# Patient Record
Sex: Female | Born: 1965 | Race: White | Hispanic: No | Marital: Married | Smoking: Never smoker
Health system: Southern US, Community
[De-identification: ages and names within clinical notes are randomized; demographics above are authoritative.]

## PROBLEM LIST (undated history)

## (undated) DIAGNOSIS — F32A Depression, unspecified: Secondary | ICD-10-CM

## (undated) DIAGNOSIS — F329 Major depressive disorder, single episode, unspecified: Secondary | ICD-10-CM

## (undated) DIAGNOSIS — E785 Hyperlipidemia, unspecified: Secondary | ICD-10-CM

## (undated) DIAGNOSIS — E109 Type 1 diabetes mellitus without complications: Secondary | ICD-10-CM

## (undated) DIAGNOSIS — F419 Anxiety disorder, unspecified: Secondary | ICD-10-CM

## (undated) DIAGNOSIS — R112 Nausea with vomiting, unspecified: Secondary | ICD-10-CM

## (undated) DIAGNOSIS — E039 Hypothyroidism, unspecified: Secondary | ICD-10-CM

## (undated) DIAGNOSIS — I872 Venous insufficiency (chronic) (peripheral): Secondary | ICD-10-CM

## (undated) DIAGNOSIS — R600 Localized edema: Secondary | ICD-10-CM

## (undated) DIAGNOSIS — Z9889 Other specified postprocedural states: Secondary | ICD-10-CM

## (undated) DIAGNOSIS — R590 Localized enlarged lymph nodes: Secondary | ICD-10-CM

## (undated) DIAGNOSIS — C50919 Malignant neoplasm of unspecified site of unspecified female breast: Secondary | ICD-10-CM

## (undated) HISTORY — DX: Depression, unspecified: F32.A

## (undated) HISTORY — DX: Major depressive disorder, single episode, unspecified: F32.9

## (undated) HISTORY — DX: Hyperlipidemia, unspecified: E78.5

## (undated) HISTORY — DX: Anxiety disorder, unspecified: F41.9

---

## 1989-08-13 HISTORY — PX: TUBAL LIGATION: SHX77

## 2002-05-14 ENCOUNTER — Other Ambulatory Visit: Admission: RE | Admit: 2002-05-14 | Discharge: 2002-05-14 | Payer: Self-pay | Admitting: Obstetrics and Gynecology

## 2004-04-03 ENCOUNTER — Other Ambulatory Visit: Admission: RE | Admit: 2004-04-03 | Discharge: 2004-04-03 | Payer: Self-pay | Admitting: Obstetrics and Gynecology

## 2004-07-13 ENCOUNTER — Ambulatory Visit: Payer: Self-pay | Admitting: Endocrinology

## 2004-11-01 ENCOUNTER — Ambulatory Visit: Payer: Self-pay | Admitting: Endocrinology

## 2004-12-18 ENCOUNTER — Ambulatory Visit: Payer: Self-pay | Admitting: Endocrinology

## 2005-03-28 ENCOUNTER — Ambulatory Visit: Payer: Self-pay | Admitting: Endocrinology

## 2005-06-21 ENCOUNTER — Other Ambulatory Visit: Admission: RE | Admit: 2005-06-21 | Discharge: 2005-06-21 | Payer: Self-pay | Admitting: Obstetrics and Gynecology

## 2005-11-13 ENCOUNTER — Ambulatory Visit: Payer: Self-pay | Admitting: Endocrinology

## 2006-03-19 ENCOUNTER — Ambulatory Visit: Payer: Self-pay | Admitting: Endocrinology

## 2006-09-04 ENCOUNTER — Ambulatory Visit: Payer: Self-pay | Admitting: Endocrinology

## 2007-03-20 ENCOUNTER — Encounter: Payer: Self-pay | Admitting: Endocrinology

## 2007-03-20 DIAGNOSIS — E109 Type 1 diabetes mellitus without complications: Secondary | ICD-10-CM | POA: Insufficient documentation

## 2007-03-20 DIAGNOSIS — F3289 Other specified depressive episodes: Secondary | ICD-10-CM | POA: Insufficient documentation

## 2007-03-20 DIAGNOSIS — F329 Major depressive disorder, single episode, unspecified: Secondary | ICD-10-CM | POA: Insufficient documentation

## 2007-03-20 DIAGNOSIS — F411 Generalized anxiety disorder: Secondary | ICD-10-CM | POA: Insufficient documentation

## 2007-08-08 ENCOUNTER — Ambulatory Visit: Payer: Self-pay | Admitting: Endocrinology

## 2007-08-14 HISTORY — PX: LUMBAR DISC SURGERY: SHX700

## 2007-10-23 ENCOUNTER — Encounter: Payer: Self-pay | Admitting: Endocrinology

## 2008-02-04 ENCOUNTER — Ambulatory Visit: Payer: Self-pay | Admitting: Endocrinology

## 2008-02-04 DIAGNOSIS — M79609 Pain in unspecified limb: Secondary | ICD-10-CM | POA: Insufficient documentation

## 2008-02-04 LAB — CONVERTED CEMR LAB: Sed Rate: 33 mm/hr — ABNORMAL HIGH (ref 0–22)

## 2008-02-05 ENCOUNTER — Telehealth (INDEPENDENT_AMBULATORY_CARE_PROVIDER_SITE_OTHER): Payer: Self-pay | Admitting: *Deleted

## 2008-02-05 ENCOUNTER — Encounter: Payer: Self-pay | Admitting: Endocrinology

## 2008-02-23 ENCOUNTER — Ambulatory Visit: Payer: Self-pay | Admitting: Endocrinology

## 2008-02-24 ENCOUNTER — Encounter (INDEPENDENT_AMBULATORY_CARE_PROVIDER_SITE_OTHER): Payer: Self-pay | Admitting: *Deleted

## 2008-03-23 ENCOUNTER — Encounter: Payer: Self-pay | Admitting: Endocrinology

## 2008-04-07 ENCOUNTER — Encounter: Payer: Self-pay | Admitting: Endocrinology

## 2008-04-10 ENCOUNTER — Encounter: Payer: Self-pay | Admitting: Endocrinology

## 2008-04-30 ENCOUNTER — Encounter: Admission: RE | Admit: 2008-04-30 | Discharge: 2008-04-30 | Payer: Self-pay | Admitting: Orthopedic Surgery

## 2008-05-20 ENCOUNTER — Encounter: Admission: RE | Admit: 2008-05-20 | Discharge: 2008-05-20 | Payer: Self-pay | Admitting: Orthopedic Surgery

## 2008-07-07 ENCOUNTER — Ambulatory Visit (HOSPITAL_COMMUNITY): Admission: RE | Admit: 2008-07-07 | Discharge: 2008-07-08 | Payer: Self-pay | Admitting: Specialist

## 2008-09-06 ENCOUNTER — Telehealth: Payer: Self-pay | Admitting: Endocrinology

## 2008-10-25 ENCOUNTER — Encounter (INDEPENDENT_AMBULATORY_CARE_PROVIDER_SITE_OTHER): Payer: Self-pay | Admitting: *Deleted

## 2008-10-25 ENCOUNTER — Ambulatory Visit: Payer: Self-pay | Admitting: Endocrinology

## 2008-10-25 DIAGNOSIS — E7800 Pure hypercholesterolemia, unspecified: Secondary | ICD-10-CM | POA: Insufficient documentation

## 2008-10-25 LAB — CONVERTED CEMR LAB
AST: 17 units/L (ref 0–37)
Albumin: 3.7 g/dL (ref 3.5–5.2)
BUN: 14 mg/dL (ref 6–23)
Basophils Absolute: 0.1 10*3/uL (ref 0.0–0.1)
Basophils Relative: 1 % (ref 0.0–3.0)
Bilirubin, Direct: 0.1 mg/dL (ref 0.0–0.3)
CO2: 30 meq/L (ref 19–32)
Chloride: 104 meq/L (ref 96–112)
Cholesterol: 183 mg/dL (ref 0–200)
Creatinine, Ser: 0.7 mg/dL (ref 0.4–1.2)
GFR calc Af Amer: 117 mL/min
GFR calc non Af Amer: 97 mL/min
HCT: 39.4 % (ref 36.0–46.0)
Monocytes Absolute: 0.5 10*3/uL (ref 0.1–1.0)
Neutro Abs: 4.3 10*3/uL (ref 1.4–7.7)
Neutrophils Relative %: 58.6 % (ref 43.0–77.0)
Platelets: 319 10*3/uL (ref 150–400)
RBC: 4.34 M/uL (ref 3.87–5.11)
Sodium: 140 meq/L (ref 135–145)
TSH: 2.76 microintl units/mL (ref 0.35–5.50)
Total Bilirubin: 1.1 mg/dL (ref 0.3–1.2)
Triglycerides: 41 mg/dL (ref 0–149)

## 2009-01-27 ENCOUNTER — Telehealth: Payer: Self-pay | Admitting: Endocrinology

## 2009-03-13 ENCOUNTER — Encounter: Payer: Self-pay | Admitting: Endocrinology

## 2009-04-13 ENCOUNTER — Telehealth: Payer: Self-pay | Admitting: Endocrinology

## 2009-04-14 ENCOUNTER — Telehealth: Payer: Self-pay | Admitting: Endocrinology

## 2009-09-14 ENCOUNTER — Encounter: Payer: Self-pay | Admitting: Endocrinology

## 2009-09-14 ENCOUNTER — Telehealth: Payer: Self-pay | Admitting: Endocrinology

## 2009-09-29 ENCOUNTER — Encounter (INDEPENDENT_AMBULATORY_CARE_PROVIDER_SITE_OTHER): Payer: Self-pay | Admitting: *Deleted

## 2009-09-29 ENCOUNTER — Ambulatory Visit: Payer: Self-pay | Admitting: Endocrinology

## 2009-09-29 LAB — CONVERTED CEMR LAB
Hgb A1c MFr Bld: 7.8 % — ABNORMAL HIGH (ref 4.6–6.5)
Microalb, Ur: 0.9 mg/dL (ref 0.0–1.9)

## 2009-09-30 ENCOUNTER — Telehealth: Payer: Self-pay | Admitting: Endocrinology

## 2010-02-15 ENCOUNTER — Telehealth: Payer: Self-pay | Admitting: Endocrinology

## 2010-02-21 ENCOUNTER — Ambulatory Visit: Payer: Self-pay | Admitting: Endocrinology

## 2010-03-13 LAB — HM PAP SMEAR: HM Pap smear: NORMAL

## 2010-03-21 ENCOUNTER — Ambulatory Visit: Payer: Self-pay | Admitting: Endocrinology

## 2010-03-21 LAB — CONVERTED CEMR LAB
AST: 13 units/L (ref 0–37)
Albumin: 3.6 g/dL (ref 3.5–5.2)
Alkaline Phosphatase: 53 units/L (ref 39–117)
Bilirubin Urine: NEGATIVE
Cholesterol: 195 mg/dL (ref 0–200)
Eosinophils Absolute: 0.3 10*3/uL (ref 0.0–0.7)
GFR calc non Af Amer: 94.63 mL/min (ref >60)
Hemoglobin, Urine: NEGATIVE
Hgb A1c MFr Bld: 8 % — ABNORMAL HIGH (ref 4.6–6.5)
LDL Cholesterol: 111 mg/dL — ABNORMAL HIGH (ref 0–99)
Lymphocytes Relative: 25.3 % (ref 12.0–46.0)
Lymphs Abs: 2 10*3/uL (ref 0.7–4.0)
MCV: 90.7 fL (ref 78.0–100.0)
Microalb, Ur: 0.2 mg/dL (ref 0.0–1.9)
Monocytes Relative: 7.3 % (ref 3.0–12.0)
Neutro Abs: 5 10*3/uL (ref 1.4–7.7)
Neutrophils Relative %: 62.5 % (ref 43.0–77.0)
Platelets: 320 10*3/uL (ref 150.0–400.0)
RBC: 4.29 M/uL (ref 3.87–5.11)
Sodium: 139 meq/L (ref 135–145)
Specific Gravity, Urine: 1.02 (ref 1.000–1.030)
TSH: 3.87 microintl units/mL (ref 0.35–5.50)
Total Bilirubin: 0.4 mg/dL (ref 0.3–1.2)
Total Protein, Urine: NEGATIVE mg/dL
VLDL: 11.8 mg/dL (ref 0.0–40.0)
WBC: 7.9 10*3/uL (ref 4.5–10.5)
pH: 7.5 (ref 5.0–8.0)

## 2010-03-23 ENCOUNTER — Ambulatory Visit: Payer: Self-pay | Admitting: Endocrinology

## 2010-03-23 DIAGNOSIS — L509 Urticaria, unspecified: Secondary | ICD-10-CM | POA: Insufficient documentation

## 2010-05-06 ENCOUNTER — Encounter: Payer: Self-pay | Admitting: Endocrinology

## 2010-09-10 LAB — CONVERTED CEMR LAB
ALT: 13 units/L (ref 0–35)
Basophils Absolute: 0.1 10*3/uL (ref 0.0–0.1)
Bilirubin, Direct: 0.2 mg/dL (ref 0.0–0.3)
CO2: 31 meq/L (ref 19–32)
Chloride: 100 meq/L (ref 96–112)
Creatinine, Ser: 0.7 mg/dL (ref 0.4–1.2)
Eosinophils Absolute: 0.3 10*3/uL (ref 0.0–0.6)
Eosinophils Relative: 4.6 % (ref 0.0–5.0)
GFR calc Af Amer: 118 mL/min
GFR calc non Af Amer: 98 mL/min
HDL: 76.6 mg/dL (ref >39.0)
Hemoglobin: 13.1 g/dL (ref 12.0–15.0)
LDL Cholesterol: 94 mg/dL (ref 0–99)
Lymphocytes Relative: 26.5 % (ref 12.0–46.0)
Microalb Creat Ratio: 3.5 mg/g (ref 0.0–30.0)
Monocytes Absolute: 0.6 10*3/uL (ref 0.2–0.7)
Monocytes Relative: 7.9 % (ref 3.0–11.0)
Platelets: 338 10*3/uL (ref 150–400)
Potassium: 3.8 meq/L (ref 3.5–5.1)
RBC: 4.18 M/uL (ref 3.87–5.11)
RDW: 12 % (ref 11.5–14.6)
Sodium: 138 meq/L (ref 135–145)
TSH: 3.77 microintl units/mL (ref 0.35–5.50)
Total Bilirubin: 0.7 mg/dL (ref 0.3–1.2)
Total CHOL/HDL Ratio: 2.4
Total Protein, Urine: NEGATIVE mg/dL
Triglycerides: 56 mg/dL (ref 0–149)
VLDL: 11 mg/dL (ref 0–40)
WBC: 7.4 10*3/uL (ref 4.5–10.5)

## 2010-09-12 NOTE — Letter (Signed)
Summary: [facility]  [address] [place], [place] [address]   Phone: [phone]  Fax: [phone]    Today's Date: September 29, 2009  Name of Patient: Jasmine Schwartz  The above named patient had a medical visit today at:  am / pm.  Please take this into consideration when reviewing the time away from work/school.    Special Instructions:  [ X ] None  [  ] To be off the remainder of today, returning to the normal work / school schedule tomorrow.  [  ] To be off until the next scheduled appointment on ______________________.  [  ] Other ________________________________________________________________ ________________________________________________________________________   Sincerely yours,   Margaret Pyle, CMA / [name] MD  Appended Document: Work Excuse faxed to [phone] per pt request

## 2010-09-12 NOTE — Progress Notes (Signed)
Summary: OV due  Phone Note Outgoing Call Call back at [facility] Phone [phone]   Call placed by: Brenton Grills MA,  February 15, 2010 11:33 AM Call placed to: Patient Details for Reason: OV Due Summary of Call: Per MD, F/U OV due. called pt left message on vm to callback office  Follow-up for Phone Call        informed pt of OV due--Appointment scheduled 02/21/10 8am Follow-up by: Brenton Grills MA,  February 16, 2010 2:47 PM

## 2010-09-12 NOTE — Assessment & Plan Note (Signed)
Summary: FU/NWS  #   Vital Signs:  Patient profile:   45 year old female Height:      64 inches (162.56 cm) Weight:      160.25 pounds (72.84 kg) BMI:     27.61 O2 Sat:      99 % on Room air Temp:     97.2 degrees F (36.22 degrees C) oral Pulse rate:   74 / minute BP sitting:   118 / 72  (left arm) Cuff size:   regular  Vitals Entered By: Brenton Grills MA (March 21, 2010 8:04 AM)  O2 Flow:  Room air CC: F/U appt/aj Is Patient Diabetic? Yes   CC:  F/U appt/aj.  History of Present Illness: pt states she feels well in general.  no cbg record, but states cbg's are elevated x a few weeks.  it is highest in am (higher than at hs, despite no hs-snack).  she takes approx 60 units/day, via her pump.    Current Medications (verified): 1)  Paradigm Insulin Pump   Devi (Insulin Infusion Pump) 2)  Nova Max Glucose Test   Strp (Glucose Blood) .... Test Blood Sugars Up To 8 Times A Day, and Lancets, 250.01 3)  Humalog 100 Unit/ml Soln (Insulin Lispro (Human)) .... Inject As Directed in Insulin Pump, 80 Units Per Day  Allergies (verified): No Known Drug Allergies  Past History:  Past Medical History: Last updated: 03/20/2007 Anxiety Depression Diabetes mellitus, type I Dyslipidemia  Review of Systems  The patient denies hypoglycemia.    Physical Exam  General:  normal appearance.  no distress  Pulses:  dorsalis pedis intact bilat.  Extremities:  no deformity.  no ulcer on the feet.  feet are of normal color and temp.  no edema there are callouses at both feet, plantar aspects, under the 2nd and 3rd metatarsal heads Neurologic:  cn 2-12 grossly intact.   readily moves all 4's.   sensation is intact to touch on the feet  Additional Exam:  Hemoglobin A1C       [H]  8.0 %   Impression & Recommendations:  Problem # 1:  DIABETES MELLITUS, TYPE I (ICD-250.01) needs increased rx  Problem # 2:  HYPERCHOLESTEROLEMIA (ICD-272.0)  Other Orders: TLB-Lipid Panel  (80061-LIPID) TLB-BMP (Basic Metabolic Panel-BMET) (80048-METABOL) TLB-CBC Platelet - w/Differential (85025-CBCD) TLB-Hepatic/Liver Function Pnl (80076-HEPATIC) TLB-TSH (Thyroid Stimulating Hormone) (84443-TSH) TLB-A1C / Hgb A1C (Glycohemoglobin) (83036-A1C) TLB-Microalbumin/Creat Ratio, Urine (82043-MALB) TLB-Udip w/ Micro (81001-URINE) Est. Patient Level III (11914)  Patient Instructions: 1)  tests are being ordered for you today.  a few days after the test(s), please call [phone] to hear your test results. 2)  pending the test results, please: 3)  increase basal rate to 0.8 units/hr, except for 1.8 units/hr, 3 am to 6 am 4)  increase mealtime bolus of 1 unit/ 4 grams carbohydrate 5)  continue correction bolus (which some people call "sensitivity," or "insulin sensitivity ratio," or just "isr") of 1 unit for each 50 by which your glucose exceeds 100). 6)  return 3 months 7)  check your blood sugar 4 times a day--before the 3 meals, and at bedtime.  also check if you have symptoms of your blood sugar being too high or too low.  please keep a record of the readings and bring it to your next appointment here.  please call us sooner if you are having low blood sugar episodes. 8)  (update: i left message on phone-tree:  despite the elev ldl, you should  consider cholesterol medication).

## 2010-09-12 NOTE — Letter (Signed)
Summary: Out of Work  [facility]  [address] [place], [place] [address]   Phone: [phone]  Fax: [phone]    March 23, 2010   Employee:  Jasmine Schwartz    To Whom It May Concern:   For Medical reasons, please excuse the above named employee from work for the following dates:  Start:   03/23/10  End:   03/24/10        Sincerely,    Minus Breeding MD

## 2010-09-12 NOTE — Letter (Signed)
Summary: [facility]   Imported By: Sherian Rein 05/11/2010 08:27:01  _____________________________________________________________________  External Attachment:    Type:   Image     Comment:   External Document

## 2010-09-12 NOTE — Progress Notes (Signed)
Summary: Test strip PA  Phone Note Other Incoming   Caller: [employer] [phone]  ID:  [id number] Summary of Call: Patient had paper stating that her insurance would be limiting test strips. Most plans have made this change for 2011. I spoke with [employer] and the patient rec'd 200 in Dec 2010 and will not affected b/c she takes insulin. PA is not needed. Initial call taken by: Lucious Groves,  September 14, 2009 8:36 AM  Follow-up for Phone Call        noted, thank you Follow-up by: Minus Breeding MD,  September 14, 2009 3:09 PM

## 2010-09-12 NOTE — Assessment & Plan Note (Signed)
Summary: hives/cd   Vital Signs:  Patient profile:   45 year old female Height:      64 inches (162.56 cm) Weight:      158 pounds (71.82 kg) BMI:     27.22 O2 Sat:      95 % on Room air Temp:     97.6 degrees F (36.44 degrees C) oral Pulse rate:   83 / minute BP sitting:   114 / 70  (left arm) Cuff size:   regular  Vitals Entered By: Brenton Grills MA (March 23, 2010 9:20 AM)  O2 Flow:  Room air CC: hives/aj Is Patient Diabetic? Yes   CC:  hives/aj.  History of Present Illness: 1 day of moderate rash across lower abdomen, flanks, thighs, buttocks, and right eye.  no assoc  fever.  other than diflucan.  she had already been on macrodantin x 7 days, and had just finished when rash started.    Current Medications (verified): 1)  Paradigm Insulin Pump   Devi (Insulin Infusion Pump) 2)  Nova Max Glucose Test   Strp (Glucose Blood) .... Test Blood Sugars Up To 8 Times A Day, and Lancets, 250.01 3)  Humalog 100 Unit/ml Soln (Insulin Lispro (Human)) .... Inject As Directed in Insulin Pump, 80 Units Per Day  Allergies (verified): No Known Drug Allergies  Past History:  Past Medical History: Last updated: 03/20/2007 Anxiety Depression Diabetes mellitus, type I Dyslipidemia  Review of Systems       no sob.  Physical Exam  General:  normal appearance.   Skin:  moderate urticaria at these areas.   Impression & Recommendations:  Problem # 1:  URTICARIA (ICD-708.9) Assessment New uncertain etiology  Medications Added to Medication List This Visit: 1)  Prednisone (pak) 5 Mg Tabs (Prednisone) .... As dir 2)  Fluticasone Propionate 0.05 % Crea (Fluticasone propionate) .... Three times a day as needed for rash  Other Orders: Est. Patient Level III ([address])  Patient Instructions: 1)  loratadine 10 mg once daily. 2)  prednisone "pack." 3)  fluticasone cream three times a day as needed for itching 4)  take extra insulin as needed elevated blood sugar, which is  expected with prednisone pill. Prescriptions: FLUTICASONE PROPIONATE 0.05 % CREA (FLUTICASONE PROPIONATE) three times a day as needed for rash  #1 med tube x 0   Entered and Authorized by:   Minus Breeding MD   Signed by:   Minus Breeding MD on 03/23/2010   Method used:   Electronically to        [facility]. [phone]* (retail)       [address]       [place], [place]  [address]       Ph: [phone] or [phone]       Fax: [phone]   RxID:   [id number] PREDNISONE (PAK) 5 MG TABS (PREDNISONE) as dir  #1 pack x 0   Entered and Authorized by:   Minus Breeding MD   Signed by:   Minus Breeding MD on 03/23/2010   Method used:   Electronically to        [facility]. [phone]* (retail)       [address]       [place], [place]  [address]       Ph: [phone] or [phone]       Fax: [phone]  RxID:   [id number]

## 2010-09-12 NOTE — Progress Notes (Signed)
Summary: Labs  ---- Converted from flag ---- ---- 09/30/2009 8:32 AM, Minus Breeding MD wrote: yes, please  ---- 09/30/2009 7:33 AM, Josph Macho RMA wrote: No, I didn't put in for anything else. I thought you said you only wanted those two done? I can call and have the pt come back in?  ---- 09/29/2009 6:12 PM, Minus Breeding MD wrote:  didn't lab do the rest of her cpx labs? ------------------------------  Phone Note Outgoing Call   Summary of Call: Left a message on pts vm to call office. Initial call taken by: Josph Macho RMA,  September 30, 2009 8:57 AM  Follow-up for Phone Call        Pt states she will go Tuesday morning. Follow-up by: Josph Macho RMA,  September 30, 2009 9:35 AM

## 2010-09-12 NOTE — Medication Information (Signed)
Summary: Letter regarding Test strips/[employer]  Letter regarding Test strips/[employer]   Imported By: Sherian Rein 09/15/2009 11:28:34  _____________________________________________________________________  External Attachment:    Type:   Image     Comment:   External Document

## 2010-09-12 NOTE — Assessment & Plan Note (Signed)
Summary: CPX / WANTS TO DO LABS SAME DAY/ NWS  #   Vital Signs:  Patient profile:   45 year old female Height:      64 inches (162.56 cm) Weight:      155.38 pounds (70.63 kg) BMI:     26.77 O2 Sat:      94 % on Room air Temp:     96.8 degrees F (36 degrees C) oral Pulse rate:   84 / minute BP sitting:   104 / 76  (left arm) Cuff size:   regular  Vitals Entered By: Josph Macho RMA (September 29, 2009 8:07 AM)  O2 Flow:  Room air CC: Physical/ Pt states she is no longer taking Ultracet/ CF Is Patient Diabetic? Yes   CC:  Physical/ Pt states she is no longer taking Ultracet/ CF.  History of Present Illness: here for regular wellness examination.  she does not drink or smoke.   Current Medications (verified): 1)  Paradigm Insulin Pump   Devi (Insulin Infusion Pump) 2)  Nova Max Glucose Test   Strp (Glucose Blood) .... Test Blood Sugars Up To 8 Times A Day, and Lancets, 250.01 3)  Ultracet 37.5-325 Mg  Tabs (Tramadol-Acetaminophen) .Marland Kitchen.. 1 Q4h As Needed Pain 4)  Humalog 100 Unit/ml Soln (Insulin Lispro (Human)) .... Inject As Directed in Insulin Pump, 80 Units Per Day  Allergies (verified): No Known Drug Allergies  Family History: Reviewed history from 02/04/2008 and no changes required. no cancer no dm in immediate family  Social History: Reviewed history from 08/08/2007 and no changes required. married works Microbiologist  Review of Systems  The patient denies fever, weight loss, weight gain, vision loss, decreased hearing, chest pain, syncope, dyspnea on exertion, prolonged cough, headaches, abdominal pain, melena, hematochezia, severe indigestion/heartburn, hematuria, suspicious skin lesions, and depression.    Physical Exam  General:  normal appearance.   Head:  head: no deformity eyes: no periorbital swelling, no proptosis external nose and ears are normal mouth: no lesion seen Neck:  Supple without thyroid enlargement or tenderness. No  cervical lymphadenopathy, neck masses or tracheal deviation.  Breasts:  sees gyn  Heart:  Regular rate and rhythm without murmurs or gallops noted. Normal S1,S2.   Abdomen:  abdomen is soft, nontender.  no hepatosplenomegaly.   not distended.  no hernia  Rectal:  sees gyn  Genitalia:  sees gyn  Msk:  muscle bulk and strength are grossly normal.  no obvious joint swelling.  gait is normal and steady  Extremities:  no deformity.  no ulcer on the feet.  feet are of normal color and temp.  no edema there are callouses at the plantar aspect, under the 2nd and 3rd metatarsal heads Neurologic:  cn 2-12 grossly intact.   readily moves all 4's.   sensation is intact to touch on the feet  Skin:  normal texture and temp.  no rash.  not diaphoretic  Cervical Nodes:  No significant adenopathy.  Additional Exam:  SEPARATE EVALUATION FOLLOWS--EACH PROBLEM HERE IS NEW, NOT RESPONDING TO TREATMENT, OR POSES SIGNIFICANT RISK TO THE PATIENT'S HEALTH: HISTORY OF THE PRESENT ILLNESS: no cbg record, but states cbg's are "high in the afternoon, and lowest in am.  pt states she feels well in general. PAST MEDICAL HISTORY reviewed and up to date today REVIEW OF SYSTEMS: denies hypoglycemia PHYSICAL EXAMINATION: clear to auscultation.  no respiratory distress dorsalis pedis intact bilat.  no carotid bruit she does not appear anxious nor depressed LAB/XRAY  RESULTS: a1c=7.8 IMPRESSION: dm, needs increased rx PLAN: see instruction sheet    Impression & Recommendations:  Problem # 1:  ROUTINE GENERAL MEDICAL EXAM@HEALTH  CARE FACL (ICD-V70.0)  Other Orders: EKG w/ Interpretation (93000) Est. Patient Level III (24401) Est. Patient 40-64 years (02725)  Preventive Care Screening     gyn is dr Shon Baton Rosalita Levan)   Patient Instructions: 1)  tests are being ordered for you today.  a few days after the test(s), please call [phone] to hear your test results. 2)  pending the test results, please  continue the same medications for now 3)  basal rate: 0.85 units/hr, except for 1.5 units/hr, 12 midnight-5 am 4)  mealtime bolus 1 unit/ 5 grams carbohydrate 5)  correction bolus 1 unit for each 50 by which your glucose exceeds 100 6)  check your blood glucose 4 times a day.  vary the time of day between before the 3 meals and at bedtime.  also check if you feel as though your glucose might be very high or too low.  bring a record of this to your doctor appointments. 7)  please consider pneumonia vaccine and cholesterol medication. 8)  (update: i left message on phone-tree:   9)  change basal rate to 0.8 units/hr, except for 1.5 units/hr, 3 am to 6 am 10)  increase mealtime bolus of 1 unit/ 4 grams carbohydrate 11)  continue correction bolus (which some people call "sensitivity," or "insulin sensitivity ratio," or just "isr") of 1 unit for each 50 by which your glucose exceeds 100). 12)  return 3 months Prescriptions: NOVA MAX GLUCOSE TEST   STRP (GLUCOSE BLOOD) TEST BLOOD SUGARS UP TO 8 TIMES A DAY, and lancets, 250.01  #204 x 11   Entered and Authorized by:   Minus Breeding MD   Signed by:   Minus Breeding MD on 09/29/2009   Method used:   Electronically to        [facility]. [phone]* (retail)       [address]       [place], [place]  [address]       Ph: [phone] or [phone]       Fax: [phone]   RxID:   [id number] HUMALOG 100 UNIT/ML SOLN (INSULIN LISPRO (HUMAN)) inject as directed in insulin pump, 80 units per day  #3 vials x 11   Entered and Authorized by:   Minus Breeding MD   Signed by:   Minus Breeding MD on 09/29/2009   Method used:   Electronically to        [facility]. [phone]* (retail)       [address]       [place], [place]  [address]       Ph: [phone] or [phone]       Fax: [phone]   RxID:   [id number]

## 2010-12-19 ENCOUNTER — Encounter: Payer: Self-pay | Admitting: Endocrinology

## 2010-12-19 ENCOUNTER — Other Ambulatory Visit (INDEPENDENT_AMBULATORY_CARE_PROVIDER_SITE_OTHER): Payer: BC Managed Care – PPO

## 2010-12-19 ENCOUNTER — Ambulatory Visit (INDEPENDENT_AMBULATORY_CARE_PROVIDER_SITE_OTHER): Payer: BC Managed Care – PPO | Admitting: Endocrinology

## 2010-12-19 VITALS — BP 112/70 | HR 81 | Temp 98.2°F | Ht 64.0 in | Wt 159.4 lb

## 2010-12-19 DIAGNOSIS — E109 Type 1 diabetes mellitus without complications: Secondary | ICD-10-CM

## 2010-12-19 MED ORDER — INSULIN LISPRO 100 UNIT/ML ~~LOC~~ SOLN: SUBCUTANEOUS | Status: DC

## 2010-12-19 NOTE — Patient Instructions (Addendum)
blood tests are being ordered for you today.  please call [phone] to hear your test results.  You will be prompted to enter the 9-digit "MRN" number that appears at the top left of this page, followed by #.  Then you will hear the message. Please make a regular physical appointment in 3 months continue basal rate of 0.8 units/hr, except for 1.8 units/hr, 3 am to 6 am continue mealtime bolus of 1 unit/ 4 grams carbohydrate continue correction bolus (which some people call "sensitivity," or "insulin sensitivity ratio," or just "isr") of 1 unit for each 50 by which your glucose exceeds 100). (update: i left message on phone-tree:  Increase bolus to 1 unit/3.5 grams cho)

## 2010-12-19 NOTE — Progress Notes (Signed)
Subjective:    Patient ID: Jasmine Schwartz, female    DOB: [birth year], 45 y.o.   MRN: [id number]  HPI Pt says she now averages less than 50 units/day.  She has had only 2 hypoglycemic episodes in the past 6 months.  She says it is well-controlled, except it is elevated at hs.  pt states she feels well in general.   Past Medical History  Diagnosis Date  . Anxiety   . Depression   . Diabetes mellitus   . Dyslipidemia     Past Surgical History  Procedure Date  . Tubal ligation   . Lumbar spine surgery 2009    Dr Jillyn Hidden    History   Social History  . Marital Status: Married    Spouse Name: N/A    Number of Children: N/A  . Years of Education: N/A   Occupational History  . Not on file.   Social History Main Topics  . Smoking status: Never Smoker   . Smokeless tobacco: Not on file  . Alcohol Use: Not on file  . Drug Use: Not on file  . Sexually Active: Not on file   Other Topics Concern  . Not on file   Social History Narrative  . No narrative on file    Current Outpatient Prescriptions on File Prior to Visit  Medication Sig Dispense Refill  . glucose blood (NOVA MAX TEST) test strip Use as directed up to 8 times a day      . Insulin Infusion Pump (MINIMED INSULIN PUMP) DEVI         No Known Allergies  Family History  Problem Relation Age of Onset  . Cancer Neg Hx   . Diabetes Neg Hx     BP 112/70  Pulse 81  Temp(Src) 98.2 F (36.8 C) (Oral)  Ht 5\' 4"  (1.626 m)  Wt 159 lb 6.4 oz (72.303 kg)  BMI 27.36 kg/m2  SpO2 99%   Past Medical History  Diagnosis Date  . Anxiety   . Depression   . Diabetes mellitus   . Dyslipidemia     Past Surgical History  Procedure Date  . Tubal ligation   . Lumbar spine surgery 2009    Dr Jillyn Hidden    History   Social History  . Marital Status: Married    Spouse Name: N/A    Number of Children: N/A  . Years of Education: N/A   Occupational History  . Not on file.   Social History Main Topics  . Smoking status:  Never Smoker   . Smokeless tobacco: Not on file  . Alcohol Use: Not on file  . Drug Use: Not on file  . Sexually Active: Not on file   Other Topics Concern  . Not on file   Social History Narrative  . No narrative on file    Current Outpatient Prescriptions on File Prior to Visit  Medication Sig Dispense Refill  . glucose blood (NOVA MAX TEST) test strip Use as directed up to 8 times a day      . Insulin Infusion Pump (MINIMED INSULIN PUMP) DEVI       . insulin lispro (HUMALOG) 100 UNIT/ML injection Inject 80 Units into the skin. Inject as directed into Insulin pump      . DISCONTD: fluticasone (CUTIVATE) 0.05 % cream Apply 1 application topically 3 (three) times daily as needed.        Marland Kitchen DISCONTD: predniSONE (DELTASONE) 5 MG tablet Take 5 mg by mouth  as directed.          No Known Allergies  Family History  Problem Relation Age of Onset  . Cancer Neg Hx   . Diabetes Neg Hx     BP 112/70  Pulse 81  Temp(Src) 98.2 F (36.8 C) (Oral)  Ht 5\' 4"  (1.626 m)  Wt 159 lb 6.4 oz (72.303 kg)  BMI 27.36 kg/m2  SpO2 99%   Review of Systems Denies loc.  Denies weight change.      Objective:   Physical Exam General:  normal appearance.  no distress. Pulses:  dorsalis pedis intact bilat.  Extremities:  no deformity.  no ulcer on the feet.  feet are of normal color and temp.  no edema. Neurologic:  cn 2-12 grossly intact.   readily moves all 4's.   sensation is intact to touch on the feet     Lab Results  Component Value Date   HGBA1C 8.4* 12/19/2010      Assessment & Plan:  Dm, needs increased rx

## 2010-12-26 NOTE — Op Note (Signed)
Jasmine Schwartz, Jasmine Schwartz                 ACCOUNT NO.:  [id number]   MEDICAL RECORD NO.:  [id number]          PATIENT TYPE:  AMB   LOCATION:  DAY                          FACILITY:  [facility]   PHYSICIAN:  Jene Every, M.D.    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   POSTOPERATIVE DIAGNOSES:  Spinal stenosis, herniated nucleus pulposus,  L5-S1, right.   PROCEDURES PERFORMED:  1. Lateral recess decompression, L5-S1, with foraminotomies of S1.  2. Microdiskectomy L5-S1.  3. Use of operating microscope.   ANESTHESIA:  General.   ASSISTANT:  Roma Schanz, P.A.   BRIEF HISTORY:  A 45 year old, refractory S1 radiculopathy, secondary to  HNP, lateral recess stenosis, spinal stenosis, 5-1, indicated for  decompression of the S1 nerve root.  Risks and benefits discussed,  including bleeding, infection, damage to vascular structures, CSF  leakage, epidural fibrosis, __________disease, need for fusion in  future, anesthetic complications, etc.   TECHNIQUE:  Patient in supine position, after induction of adequate  anesthesia, 1 gram of Kefzol, she was placed prone on the Bolingbroke frame.  All bony prominences were well-padded.  Lumbar region was prepped and  draped in the usual sterile fashion.  Two 18-gauge spinal needles were  utilized to localize the 5-1 interspace, confirmed with x-ray.  Incision  was made over the spinous process of 5-S1.  Subcutaneous tissue was  dissected.  Electrocautery was utilized  to achieve hemostasis.  She had  a fairly significant subcutaneous adipose tissue.  Dorsolumbar fascia  identified, divided in line with the skin incision.  Paraspinous muscle  elevated from lamina of 5-1.  McCullough retractor was placed.  Operating microscope draped and brought onto the surgical field after  confirming the space with a Penfield form and a second radiograph.   First a curet was utilized to test ligamentum flavum from cephalad edge  of S1.  Neural patty placed beneath the ligamentum flavum.  Small  interlaminar window was noted and we performed a foraminotomy with a 2  mm Kerrison of the S1 nerve root.  This was found to be severely  compressed into the lateral recess.  We decompressed the lateral recess  to the medial border pedicle utilizing a 2 mm Kerrison, preserving the  S1 nerve root at all times.  Following this decompression, it was found  be erythematous and edematous.  We removed the ligamentum flavum  cephalad and undercut the lamina of 5, utilizing a 2 mm Kerrison.  Noted  was a focal HNP.  I performed the annulotomy.  Copious portion of disc  was removed from the disk space with a straight and micro pituitary,  further mobilized with an Epstein and a hockey stick.  All herniated  material was removed.  The neural elements were protected at all times.  Hockey-stick probe then passed freely out the foramina of 5 and 1,  checked beneath the thecal sac, the axilla of root, the shoulder of  the  root.  There was no evidence of residual disk herniation.  There was at  least 1 cm of excursion of the S1 nerve root medial to the pedicle.  There was no tension on the S1 nerve root.   The disc space was copiously irrigated with antibiotic irrigation.  Inspection revealed no evidence of CSF leakage or active bleeding.  All  retractors were removed.  We irrigated.  Paraspinous muscle inspected,  no evidence of active bleeding.  Dorsolumbar fascia reapproximated with  #1Vicryl figure-of-eight suture.  Subcutaneous tissue was reapproximated  in layers, 2-0 Vicryl simple sutures.  Skin was reapproximated with 4-0  subcuticular Prolene, reinforced with Steri-Strips.  Sterile dressing  applied.  The patient was placed supine on hospital bed, extubated  without difficulty and  transported to the recovery in satisfactory  addition.   The patient tolerated the procedure, no complications.  Minimal blood  loss.      Jene Every, M.D.  Electronically Signed     JB/MEDQ  D:  07/07/2008  T:  07/07/2008  Job:  [id number]

## 2010-12-29 NOTE — Assessment & Plan Note (Signed)
[facility]                                 ON-CALL NOTE   SHAWNA, WEARING                          MRN:          [id number]  DATE:07/10/2007                            DOB:          26-Dec-1965    This is a dictation for patient named, Jasmine Schwartz, July 10, 2007, at  5:30 p.m., phone number is [phone].  This is an addendum to my previous  note.  This is Dr. Milinda Antis dictating, and Dr. Everardo All is the primary.   CHIEF COMPLAINT:  Insulin problem.   I spoke to Dr. Everardo All, who was on call in the hospital, about Jasmine Schwartz and her insulin needs with a broken insulin pump.  The patient  stated she figured out her basal rate at night was about one unit an  hour, and I spoke to Dr. Everardo All and made the plan to dose her with  Lantus insulin 24 units q.h.s. and to see how she does for the next day  and then to call the office in the morning for further instruction.  I  called in Lantus insulin to [facility] at [phone], the Lantus insulin  24 units q.h.s. as directed, a one week supply with no refills, and I  told Mrs. Hollings to call back if she has any questions or problems  tonight.     Marne A. Tower, MD  Electronically Signed    MAT/MedQ  DD: 07/10/2007  DT: 07/10/2007  Job #: [id number]   cc:   Gregary Signs A. Everardo All, MD

## 2010-12-29 NOTE — Assessment & Plan Note (Signed)
[facility]                                 ON-CALL NOTE   HONOUR, SCHWIEGER                          MRN:          [id number]  DATE:07/10/2007                            DOB:          March 07, 1966    PHONE NUMBER:  [phone].   TIME:  4:19 p.m.   CHIEF COMPLAINT:  Insulin question.   PROGRESS NOTE:  The patient states that she wears an insulin pump and  has for the past 3 years. Her sensor on her insulin pump went out  several hours ago. She called the company. They cannot ship out the  sensor for her pump until Saturday morning and she is going to be  without a functioning insulin pump until then. She says she has no idea  what kind of insulin needs she is going to have because she has had the  pump for 3 years and has not needed to take a long acting insulin and  she wanted me to recommend a dose of long acting insulin for her. I told  her for the next several hours and overnight, to check her sugar  frequently and to use her sliding scale and short acting insulin to get  a feeling for what her needs are going to be. Her last blood sugar was  70 and she ate a little something and she is going to check it again  soon. I told her to track her sugars over the next several hours, to  call back if she needed to talk to me. Otherwise, to call the office  when it opens in the morning, to get further instructions from Dr.  Everardo All and a calculation on how much long acting insulin she would need  for the next several days, until her pump is fixed. And if she feels  sick or has any new symptoms to call back immediately.     Marne A. Tower, MD  Electronically Signed    MAT/MedQ  DD: 07/10/2007  DT: 07/10/2007  Job #: [id number]   cc:   Gregary Signs A. Everardo All, MD

## 2011-03-23 ENCOUNTER — Other Ambulatory Visit (INDEPENDENT_AMBULATORY_CARE_PROVIDER_SITE_OTHER): Payer: BC Managed Care – PPO

## 2011-03-23 ENCOUNTER — Encounter: Payer: Self-pay | Admitting: *Deleted

## 2011-03-23 ENCOUNTER — Ambulatory Visit (INDEPENDENT_AMBULATORY_CARE_PROVIDER_SITE_OTHER): Payer: BC Managed Care – PPO | Admitting: Endocrinology

## 2011-03-23 ENCOUNTER — Encounter: Payer: Self-pay | Admitting: Endocrinology

## 2011-03-23 VITALS — BP 124/72 | HR 83 | Temp 98.8°F | Ht 65.0 in | Wt 159.0 lb

## 2011-03-23 DIAGNOSIS — E049 Nontoxic goiter, unspecified: Secondary | ICD-10-CM

## 2011-03-23 DIAGNOSIS — F329 Major depressive disorder, single episode, unspecified: Secondary | ICD-10-CM

## 2011-03-23 DIAGNOSIS — F3289 Other specified depressive episodes: Secondary | ICD-10-CM

## 2011-03-23 DIAGNOSIS — E109 Type 1 diabetes mellitus without complications: Secondary | ICD-10-CM

## 2011-03-23 DIAGNOSIS — E7800 Pure hypercholesterolemia, unspecified: Secondary | ICD-10-CM

## 2011-03-23 DIAGNOSIS — Z79899 Other long term (current) drug therapy: Secondary | ICD-10-CM

## 2011-03-23 LAB — MICROALBUMIN / CREATININE URINE RATIO: Microalb, Ur: 0.3 mg/dL (ref 0.0–1.9)

## 2011-03-23 LAB — CBC WITH DIFFERENTIAL/PLATELET
Basophils Absolute: 0.1 10*3/uL (ref 0.0–0.1)
Basophils Relative: 0.6 % (ref 0.0–3.0)
HCT: 41.4 % (ref 36.0–46.0)
Lymphocytes Relative: 28.1 % (ref 12.0–46.0)
MCV: 91.3 fl (ref 78.0–100.0)
Monocytes Relative: 9.2 % (ref 3.0–12.0)
Neutro Abs: 4.7 10*3/uL (ref 1.4–7.7)
Neutrophils Relative %: 56.9 % (ref 43.0–77.0)
Platelets: 360 10*3/uL (ref 150.0–400.0)
RBC: 4.53 Mil/uL (ref 3.87–5.11)
RDW: 13.1 % (ref 11.5–14.6)

## 2011-03-23 LAB — HEPATIC FUNCTION PANEL
ALT: 14 U/L (ref 0–35)
AST: 18 U/L (ref 0–37)
Albumin: 4 g/dL (ref 3.5–5.2)

## 2011-03-23 LAB — URINALYSIS, ROUTINE W REFLEX MICROSCOPIC
Hgb urine dipstick: NEGATIVE
Specific Gravity, Urine: 1.02 (ref 1.000–1.030)
Total Protein, Urine: NEGATIVE
Urine Glucose: NEGATIVE
Urobilinogen, UA: 1 (ref 0.0–1.0)

## 2011-03-23 LAB — HEMOGLOBIN A1C: Hgb A1c MFr Bld: 8 % — ABNORMAL HIGH (ref 4.6–6.5)

## 2011-03-23 LAB — BASIC METABOLIC PANEL
BUN: 18 mg/dL (ref 6–23)
CO2: 29 mEq/L (ref 19–32)
Calcium: 9.3 mg/dL (ref 8.4–10.5)
Creatinine, Ser: 0.7 mg/dL (ref 0.4–1.2)
GFR: 89.81 mL/min (ref >60.00)
Glucose, Bld: 87 mg/dL (ref 70–99)

## 2011-03-23 LAB — LIPID PANEL
HDL: 84 mg/dL (ref >39.00)
LDL Cholesterol: 90 mg/dL (ref 0–99)
VLDL: 9.8 mg/dL (ref 0.0–40.0)

## 2011-03-23 LAB — TSH: TSH: 3.88 u[IU]/mL (ref 0.35–5.50)

## 2011-03-23 NOTE — Patient Instructions (Addendum)
blood tests are being ordered for you today.  please call [phone] to hear your test results.  You will be prompted to enter the 9-digit "MRN" number that appears at the top left of this page, followed by #.  Then you will hear the message. continue basal rate of 0.8 units/hr, except for 1.8 units/hr, 3 am to 6 am  continue mealtime bolus of 1 unit/ 3.5 grams carbohydrate  continue correction bolus (which some people call "sensitivity," or "insulin sensitivity ratio," or just "isr") of 1 unit for each 50 by which your glucose exceeds 100).   You should get a continuous monitor.  Let me know if you want to see a diabetes educator to consider. please consider these measures for your health:  minimize alcohol.  do not use tobacco products.  have a colonoscopy at least every 10 years from age 51.  Women should have an annual mammogram from age 57.  keep firearms safely stored.  always use seat belts.  have working smoke alarms in your home.  see an eye doctor and dentist regularly.  never drive under the influence of alcohol or drugs (including prescription drugs).  those with fair skin should take precautions against the sun. Please make a follow-up appointment in 3 months (update: i left message on phone-tree:  Please take the simpler pump regimen.  i forgot to tell you about the need for thyroid US.  you will receive a phone call, about a day and time for an appointment

## 2011-03-23 NOTE — Progress Notes (Signed)
Subjective:    Patient ID: Jasmine Schwartz, female    DOB: 05/11/1966, 45 y.o.   MRN: [id number]  HPI here for regular wellness examination.  she's feeling pretty well in general, and says chronic med probs are stable, except as noted below.  Gyn is dr Shon Baton Rosalita Levan). Past Medical History  Diagnosis Date  . Anxiety   . Depression   . Diabetes mellitus   . Dyslipidemia     Past Surgical History  Procedure Date  . Tubal ligation   . Lumbar spine surgery 2009    Dr Jillyn Hidden    History   Social History  . Marital Status: Married    Spouse Name: N/A    Number of Children: N/A  . Years of Education: N/A   Occupational History  . Not on file.   Social History Main Topics  . Smoking status: Never Smoker   . Smokeless tobacco: Not on file  . Alcohol Use: Not on file  . Drug Use: Not on file  . Sexually Active: Not on file   Other Topics Concern  . Not on file   Social History Narrative  . No narrative on file    Current Outpatient Prescriptions on File Prior to Visit  Medication Sig Dispense Refill  . glucose blood (NOVA MAX TEST) test strip Use as directed up to 8 times a day      . insulin lispro (HUMALOG) 100 UNIT/ML injection Inject as directed into Insulin pump, total of 80 units/day  30 mL  11  . Insulin Infusion Pump (MINIMED INSULIN PUMP) DEVI         No Known Allergies  Family History  Problem Relation Age of Onset  . Cancer Neg Hx   . Diabetes Neg Hx     BP 124/72  Pulse 83  Temp(Src) 98.8 F (37.1 C) (Oral)  Ht 5\' 5"  (1.651 m)  Wt 159 lb (72.122 kg)  BMI 26.46 kg/m2  SpO2 97%  LMP 03/08/2011     Review of Systems  Constitutional: Negative for fever and unexpected weight change.  HENT: Negative for hearing loss.   Eyes: Negative for visual disturbance.  Respiratory: Negative for shortness of breath.   Cardiovascular: Negative for chest pain.  Gastrointestinal: Negative for abdominal pain.  Genitourinary: Negative for hematuria.    Musculoskeletal: Positive for arthralgias.  Skin: Negative for rash.  Neurological: Negative for syncope and headaches.  Hematological: Does not bruise/bleed easily.  Psychiatric/Behavioral: Negative for dysphoric mood.       Objective:   Physical Exam VS: see vs page GEN: no distress HEAD: head: no deformity eyes: no periorbital swelling, no proptosis external nose and ears are normal mouth: no lesion seen NECK: supple, thyroid is not enlarged. CHEST WALL: no deformity BREASTS:  sees gyn. CV: reg rate and rhythm, no murmur ABD: abdomen is soft, nontender.  no hepatosplenomegaly.  not distended.  no hernia. GENITALIA:  sees gyn RECTAL: sees gyn MUSCULOSKELETAL: muscle bulk and strength are grossly normal.  no obvious joint swelling.  gait is normal and steady EXTEMITIES: no deformity.  no ulcer on the feet.  feet are of normal color and temp.  no edema PULSES: dorsalis pedis intact bilat.  no carotid bruit NEURO:  cn 2-12 grossly intact.   readily moves all 4's.   SKIN:  Normal texture and temperature.  No rash or suspicious lesion is visible.  infusion injection sites at the anterior abdomen are normal NODES:  None palpable at the neck.  PSYCH: alert, oriented x3.  Does not appear anxious nor depressed.        Assessment & Plan:    SEPARATE EVALUATION FOLLOWS--EACH PROBLEM HERE IS NEW, NOT RESPONDING TO TREATMENT, OR POSES SIGNIFICANT RISK TO THE PATIENT'S HEALTH: HISTORY OF THE PRESENT ILLNESS: no cbg record, but states cbg went low after evening meal if she takes the increased mealtime bolus that was advised at last ov.  She increased the basal rate to 1.2/hr, midnight-7 am.  She then take a basal rate of 0.9/hr, until 1:30 pm.  Then basal rate decreases to 1.0 unit/hr.  She takes a total of approx 50 unit per day, via her pump.  She says her insurance told her last year they would not cover a continuous monitor.   A goiter is incidentally noted on physical exam today.    PAST MEDICAL HISTORY reviewed and up to date today REVIEW OF SYSTEMS: denies hypoglycemia PHYSICAL EXAMINATION: GENERAL: no distress Neuro: sensation is intact to touch on the feet Neck: ? Of small  multinodular goiter LAB/XRAY RESULTS: Lab Results  Component Value Date   TSH 3.88 03/23/2011   Lab Results  Component Value Date   HGBA1C 8.0* 03/23/2011  IMPRESSION: ? Of goiter, new Dm.  She may do better with a simpler regimen. PLAN: See instruction page

## 2011-04-06 ENCOUNTER — Encounter: Payer: Self-pay | Admitting: Internal Medicine

## 2011-05-15 LAB — PROTIME-INR
INR: 1
Prothrombin Time: 13

## 2011-05-15 LAB — URINE MICROSCOPIC-ADD ON

## 2011-05-15 LAB — COMPREHENSIVE METABOLIC PANEL
BUN: 17
CO2: 28
Chloride: 100
Creatinine, Ser: 0.67
GFR calc non Af Amer: >60
Glucose, Bld: 196 — ABNORMAL HIGH
Total Bilirubin: 0.8

## 2011-05-15 LAB — URINALYSIS, ROUTINE W REFLEX MICROSCOPIC
Glucose, UA: >1000 — AB
Hgb urine dipstick: NEGATIVE
Leukocytes, UA: NEGATIVE
Specific Gravity, Urine: 1.037 — ABNORMAL HIGH

## 2011-05-15 LAB — GLUCOSE, CAPILLARY
Glucose-Capillary: 118 — ABNORMAL HIGH
Glucose-Capillary: 135 — ABNORMAL HIGH
Glucose-Capillary: 192 — ABNORMAL HIGH
Glucose-Capillary: 227 — ABNORMAL HIGH
Glucose-Capillary: 239 — ABNORMAL HIGH

## 2011-05-15 LAB — CBC
HCT: 39.9
Hemoglobin: 13.8
MCV: 92.7
Platelets: 308
RBC: 4.31
WBC: 7.6

## 2011-05-15 LAB — APTT: aPTT: 30

## 2011-07-06 ENCOUNTER — Encounter: Payer: Self-pay | Admitting: Internal Medicine

## 2011-07-06 ENCOUNTER — Ambulatory Visit (INDEPENDENT_AMBULATORY_CARE_PROVIDER_SITE_OTHER): Payer: BC Managed Care – PPO | Admitting: Internal Medicine

## 2011-07-06 VITALS — BP 104/64 | HR 74 | Temp 98.1°F | Wt 154.0 lb

## 2011-07-06 DIAGNOSIS — F32A Depression, unspecified: Secondary | ICD-10-CM

## 2011-07-06 DIAGNOSIS — F341 Dysthymic disorder: Secondary | ICD-10-CM

## 2011-07-06 DIAGNOSIS — F419 Anxiety disorder, unspecified: Secondary | ICD-10-CM

## 2011-07-06 DIAGNOSIS — F329 Major depressive disorder, single episode, unspecified: Secondary | ICD-10-CM | POA: Insufficient documentation

## 2011-07-06 HISTORY — DX: Anxiety disorder, unspecified: F41.9

## 2011-07-06 HISTORY — DX: Depression, unspecified: F32.A

## 2011-07-06 MED ORDER — BUPROPION HCL ER (XL) 150 MG PO TB24: 150.0000 mg | ORAL_TABLET | Freq: Every day | ORAL | Status: DC

## 2011-07-06 NOTE — Patient Instructions (Addendum)
Take all new medications as prescribed Continue all other medications as before Please call in 4 wks if you feel you need the higher strength of the 300 mg wellbutrin

## 2011-07-07 ENCOUNTER — Encounter: Payer: Self-pay | Admitting: Internal Medicine

## 2011-07-07 NOTE — Progress Notes (Signed)
  Subjective:    Patient ID: Jasmine Schwartz, female    DOB: Dec 08, 1965, 45 y.o.   MRN: [id number]  HPI  Here to f/u with c/o not so much depression but a tenseness/irritability that seemed to respond to wellbutrin in the past;  Has some anxiety but simply does not want SSRI that might cause increased wt gain or sexual side effect.  Pt denies chest pain, increased sob or doe, wheezing, orthopnea, PND, increased LE swelling, palpitations, dizziness or syncope.  Pt denies new neurological symptoms such as new headache, or facial or extremity weakness or numbness   Pt denies polydipsia, polyuria.  Pt states overall good compliance with meds, trying to follow lower cholesterol, diabetic diet, wt overall stable but little exercise however.   Overall good compliance with treatment, and good medicine tolerability. Past Medical History  Diagnosis Date  . Anxiety   . Depression   . Diabetes mellitus   . Dyslipidemia   . Anxiety and depression 07/06/2011   Past Surgical History  Procedure Date  . Tubal ligation   . Lumbar spine surgery 2009    Dr Jillyn Hidden    reports that she has never smoked. She does not have any smokeless tobacco history on file. Her alcohol and drug histories not on file. family history is negative for Cancer and Diabetes. No Known Allergies Current Outpatient Prescriptions on File Prior to Visit  Medication Sig Dispense Refill  . glucose blood (NOVA MAX TEST) test strip Use as directed up to 8 times a day      . Insulin Infusion Pump (MINIMED INSULIN PUMP) DEVI       . insulin lispro (HUMALOG) 100 UNIT/ML injection Inject as directed into Insulin pump, total of 80 units/day  30 mL  11   Review of Systems Review of Systems  Constitutional: Negative for diaphoresis and unexpected weight change.  HENT: Negative for drooling and tinnitus.   Eyes: Negative for photophobia and visual disturbance.  Respiratory: Negative for choking and stridor.   Gastrointestinal: Negative for vomiting  and blood in stool.  Genitourinary: Negative for hematuria and decreased urine volume.       Objective:   Physical Exam BP 104/64  Pulse 74  Temp(Src) 98.1 F (36.7 C) (Oral)  Wt 154 lb (69.854 kg)  SpO2 95% Physical Exam  VS noted Constitutional: Pt appears well-developed and well-nourished.  HENT: Head: Normocephalic.  Right Ear: External ear normal.  Left Ear: External ear normal.  Eyes: Conjunctivae and EOM are normal. Pupils are equal, round, and reactive to light.  Neck: Normal range of motion. Neck supple.  Cardiovascular: Normal rate and regular rhythm.   Pulmonary/Chest: Effort normal and breath sounds normal.  Skin: Skin is warm. No erythema.  Psychiatric: Pt behavior is normal. Thought content normal. 1+ nervous/not depressed affect    Assessment & Plan:

## 2012-01-29 ENCOUNTER — Other Ambulatory Visit: Payer: Self-pay

## 2012-01-29 MED ORDER — INSULIN LISPRO 100 UNIT/ML ~~LOC~~ SOLN: SUBCUTANEOUS | Status: DC

## 2012-02-06 ENCOUNTER — Telehealth: Payer: Self-pay | Admitting: *Deleted

## 2012-02-06 DIAGNOSIS — E109 Type 1 diabetes mellitus without complications: Secondary | ICD-10-CM

## 2012-02-06 DIAGNOSIS — Z0000 Encounter for general adult medical examination without abnormal findings: Secondary | ICD-10-CM

## 2012-02-06 NOTE — Telephone Encounter (Signed)
Message copied by Carin Primrose on Wed Feb 06, 2012  3:58 PM ------      Message from: Etheleen Sia      Created: Fri Feb 01, 2012  2:56 PM      Regarding: LABS       PHYSICAL LABS FOR AUG.  PT IS DIABETIC.

## 2012-02-06 NOTE — Telephone Encounter (Signed)
CPX labs entered into Epic for upcoming appointment.

## 2012-03-25 ENCOUNTER — Telehealth: Payer: Self-pay

## 2012-03-25 MED ORDER — INSULIN LISPRO 100 UNIT/ML ~~LOC~~ SOLN: SUBCUTANEOUS | Status: DC

## 2012-03-25 NOTE — Telephone Encounter (Signed)
Patient called Jasmine Schwartz stating that appointment is next week but will be out of insulin prior to appt. Please advise if ok to refill.

## 2012-03-31 ENCOUNTER — Ambulatory Visit (INDEPENDENT_AMBULATORY_CARE_PROVIDER_SITE_OTHER): Payer: BC Managed Care – PPO | Admitting: Endocrinology

## 2012-03-31 ENCOUNTER — Other Ambulatory Visit (INDEPENDENT_AMBULATORY_CARE_PROVIDER_SITE_OTHER): Payer: BC Managed Care – PPO

## 2012-03-31 ENCOUNTER — Encounter: Payer: Self-pay | Admitting: Endocrinology

## 2012-03-31 VITALS — BP 110/62 | HR 73 | Temp 97.8°F | Ht 64.0 in | Wt 157.0 lb

## 2012-03-31 DIAGNOSIS — R0602 Shortness of breath: Secondary | ICD-10-CM

## 2012-03-31 DIAGNOSIS — Z0000 Encounter for general adult medical examination without abnormal findings: Secondary | ICD-10-CM

## 2012-03-31 DIAGNOSIS — E109 Type 1 diabetes mellitus without complications: Secondary | ICD-10-CM

## 2012-03-31 DIAGNOSIS — Z23 Encounter for immunization: Secondary | ICD-10-CM

## 2012-03-31 LAB — CBC WITH DIFFERENTIAL/PLATELET
Basophils Relative: 0.5 % (ref 0.0–3.0)
Eosinophils Relative: 5.3 % — ABNORMAL HIGH (ref 0.0–5.0)
HCT: 37.4 % (ref 36.0–46.0)
Hemoglobin: 12.4 g/dL (ref 12.0–15.0)
Lymphs Abs: 2.1 10*3/uL (ref 0.7–4.0)
MCV: 91.9 fl (ref 78.0–100.0)
Monocytes Absolute: 0.6 10*3/uL (ref 0.1–1.0)
Monocytes Relative: 7.8 % (ref 3.0–12.0)
Neutro Abs: 4 10*3/uL (ref 1.4–7.7)
Platelets: 308 10*3/uL (ref 150.0–400.0)
WBC: 7.1 10*3/uL (ref 4.5–10.5)

## 2012-03-31 LAB — TSH: TSH: 2.54 u[IU]/mL (ref 0.35–5.50)

## 2012-03-31 LAB — URINALYSIS, ROUTINE W REFLEX MICROSCOPIC
Ketones, ur: 15
Leukocytes, UA: NEGATIVE
Nitrite: NEGATIVE
Specific Gravity, Urine: >=1.03 (ref 1.000–1.030)
Urobilinogen, UA: 1 (ref 0.0–1.0)
pH: 6 (ref 5.0–8.0)

## 2012-03-31 LAB — BASIC METABOLIC PANEL
BUN: 13 mg/dL (ref 6–23)
CO2: 27 mEq/L (ref 19–32)
Calcium: 8.9 mg/dL (ref 8.4–10.5)
Creatinine, Ser: 0.6 mg/dL (ref 0.4–1.2)
GFR: 105.72 mL/min (ref >60.00)
Glucose, Bld: 124 mg/dL — ABNORMAL HIGH (ref 70–99)
Sodium: 140 mEq/L (ref 135–145)

## 2012-03-31 LAB — HEPATIC FUNCTION PANEL
Albumin: 3.6 g/dL (ref 3.5–5.2)
Alkaline Phosphatase: 42 U/L (ref 39–117)
Total Protein: 6.7 g/dL (ref 6.0–8.3)

## 2012-03-31 LAB — MICROALBUMIN / CREATININE URINE RATIO
Microalb Creat Ratio: 0.4 mg/g (ref 0.0–30.0)
Microalb, Ur: 0.8 mg/dL (ref 0.0–1.9)

## 2012-03-31 LAB — LIPID PANEL
LDL Cholesterol: 88 mg/dL (ref 0–99)
Total CHOL/HDL Ratio: 2
Triglycerides: 46 mg/dL (ref 0.0–149.0)

## 2012-03-31 NOTE — Patient Instructions (Addendum)
Let's check a treadmill test.  you will receive a phone call, about a day and time for an appointment. good diet and exercise habits significanly improve the control of your diabetes.  please let me know if you wish to be referred to a dietician.  high blood sugar is very risky to your health.  you should see an eye doctor every year.  You are at higher than average risk for pneumonia and hepatitis-B.  You should be vaccinated against both.   Please come back for a follow-up appointment in 3 months please consider these measures for your health:  minimize alcohol.  do not use tobacco products.  have a colonoscopy at least every 10 years from age 63.  Women should have an annual mammogram from age 37.  keep firearms safely stored.  always use seat belts.  have working smoke alarms in your home.  see an eye doctor and dentist regularly.  never drive under the influence of alcohol or drugs (including prescription drugs).  those with fair skin should take precautions against the sun.

## 2012-03-31 NOTE — Progress Notes (Signed)
Subjective:    Patient ID: Jasmine Schwartz, female    DOB: [birth year], 46 y.o.   MRN: [id number]  HPI here for regular wellness examination.  He's feeling pretty well in general, and says chronic med probs are stable, except as noted below Past Medical History  Diagnosis Date  . Anxiety   . Depression   . Diabetes mellitus   . Dyslipidemia   . Anxiety and depression 07/06/2011    Past Surgical History  Procedure Date  . Tubal ligation   . Lumbar spine surgery 2009    Dr Jillyn Hidden    History   Social History  . Marital Status: Married    Spouse Name: N/A    Number of Children: N/A  . Years of Education: N/A   Occupational History  . Not on file.   Social History Main Topics  . Smoking status: Never Smoker   . Smokeless tobacco: Not on file  . Alcohol Use: Not on file  . Drug Use: Not on file  . Sexually Active: Not on file   Other Topics Concern  . Not on file   Social History Narrative  . No narrative on file    Current Outpatient Prescriptions on File Prior to Visit  Medication Sig Dispense Refill  . glucose blood (NOVA MAX TEST) test strip Use as directed up to 8 times a day      . Insulin Infusion Pump (MINIMED INSULIN PUMP) DEVI       . insulin lispro (HUMALOG) 100 UNIT/ML injection Inject as directed into Insulin pump, total of 80 units/day  30 mL  0    No Known Allergies  Family History  Problem Relation Age of Onset  . Cancer Neg Hx   . Diabetes Neg Hx     BP 110/62  Pulse 73  Temp 97.8 F (36.6 C) (Oral)  Ht 5\' 4"  (1.626 m)  Wt 157 lb (71.215 kg)  BMI 26.95 kg/m2  SpO2 97%  LMP 03/27/2012  Review of Systems  Constitutional: Negative for fever and unexpected weight change.  HENT: Negative for hearing loss.   Eyes: Negative for visual disturbance.  Respiratory: Negative for cough.   Cardiovascular: Negative for leg swelling.  Gastrointestinal: Negative for abdominal pain and anal bleeding.  Genitourinary: Negative for hematuria.    Musculoskeletal: Negative for back pain.  Skin: Negative for rash.  Neurological: Negative for syncope and headaches.  Hematological: Does not bruise/bleed easily.  Psychiatric/Behavioral: Negative for dysphoric mood.      Objective:   Physical Exam VS: see vs page GEN: no distress HEAD: head: no deformity eyes: no periorbital swelling, no proptosis external nose and ears are normal mouth: no lesion seen NECK: supple, thyroid is not enlarged CHEST WALL: no deformity LUNGS:  Clear to auscultation BREASTS:  sees gyn CV: reg rate and rhythm, no murmur ABD: abdomen is soft, nontender.  no hepatosplenomegaly.  not distended.  no hernia. GENITALIA/RECTAL: sees gyn. MUSCULOSKELETAL: muscle bulk and strength are grossly normal.  no obvious joint swelling.  gait is normal and steady EXTEMITIES: no deformity.  no ulcer on the feet.  feet are of normal color and temp.  no edema NEURO:  cn 2-12 grossly intact.   readily moves all 4's.  sensation is intact to touch on the feet SKIN:  Normal texture and temperature.  No rash or suspicious lesion is visible.   NODES:  None palpable at the neck PSYCH: alert, oriented x3.  Does not appear anxious nor depressed.  Assessment & Plan:  Wellness visit today, with problems stable, except as noted.    SEPARATE EVALUATION FOLLOWS--EACH PROBLEM HERE IS NEW, NOT RESPONDING TO TREATMENT, OR POSES SIGNIFICANT RISK TO THE PATIENT'S HEALTH: HISTORY OF THE PRESENT ILLNESS: Pt states few months of slight intermittent doe sensation in the chest, but no assoc chest pain. Pt returns for f/u of type 1 DM (dx'ed 1990; no known complications; on pump rx since 2004; she declined continuous glucose monitor, due to cost).  no cbg record, but states cbg's are well-controlled PAST MEDICAL HISTORY reviewed and up to date today. REVIEW OF SYSTEMS: She seldom has hypoglycemia, and these episodes are mild.  It it in general highest after lunch.   Denies  numbness PHYSICAL EXAMINATION: VITAL SIGNS:  See vs page GENERAL: no distress Pulses: dorsalis pedis intact bilat.  no carotid bruit Neuro: sensation is intact to touch on the feet LAB/XRAY RESULTS: Lab Results  Component Value Date   HGBA1C 7.4* 03/31/2012  IMPRESSION: DOE, new, uncertain etiology Type 1 DM.  She needs slightly increased rx PLAN: See instruction page

## 2012-04-01 ENCOUNTER — Telehealth: Payer: Self-pay | Admitting: *Deleted

## 2012-04-01 LAB — HEPATITIS B SURFACE ANTIBODY,QUALITATIVE: Hep B S Ab: NEGATIVE

## 2012-04-01 NOTE — Telephone Encounter (Signed)
Called pt to inform of lab results, pt informed (letter also mailed to pt). 

## 2012-04-02 ENCOUNTER — Encounter: Payer: Self-pay | Admitting: *Deleted

## 2012-04-02 ENCOUNTER — Telehealth: Payer: Self-pay | Admitting: Endocrinology

## 2012-04-02 NOTE — Telephone Encounter (Signed)
pt needs a work note faxed to her job for her appt earlier this week: fax [phone]

## 2012-04-02 NOTE — Telephone Encounter (Signed)
Work note faxed to number provided, pt informed.

## 2012-04-17 ENCOUNTER — Ambulatory Visit (INDEPENDENT_AMBULATORY_CARE_PROVIDER_SITE_OTHER): Payer: BC Managed Care – PPO | Admitting: Physician Assistant

## 2012-04-17 ENCOUNTER — Encounter: Payer: Self-pay | Admitting: Physician Assistant

## 2012-04-17 DIAGNOSIS — R0602 Shortness of breath: Secondary | ICD-10-CM

## 2012-04-17 NOTE — Procedures (Signed)
Jasmine Schwartz is a 46 y.o. female Type I diabetic referred by PCP for ETT due to recent onset of exertional chest discomfort and dyspnea.  No syncope.  Never been a smoker.  FHx notable for CAD (mother had "stents" and grandmother had MI in 76s).  Exam unremarkable.   Exercise Treadmill Test  Pre-Exercise Testing Evaluation Rhythm: normal sinus  Rate: 88   PR:  .12 QRS:  .08  QT:  .34 QTc: .41     Test  Exercise Tolerance Test Ordering MD: Lorna Few  Interpreting MD: Tereso Newcomer , PA-C  Unique Test No: 1  Treadmill:  1  Indication for ETT: exertional dyspnea  Contraindication to ETT: No   Stress Modality: exercise - treadmill  Cardiac Imaging Performed: non   Protocol: standard Bruce - maximal  Max BP:  163/62  Max MPHR (bpm):  173 85% HR 147  MPHR obtained (bpm):  162 % MPHR obtained:  93%  Reached 85% MPHR (min:sec):  7:30 Total Exercise Time (min-sec):  9:00  Workload in METS:  10.1 Borg Scale: 17  Reason ETT Terminated:  patient's desire to stop    ST Segment Analysis At Rest: normal ST segments - no evidence of significant ST depression With Exercise: no evidence of significant ST depression  Other Information Arrhythmia:  No Angina during ETT:  absent (0) Quality of ETT:  diagnostic  ETT Interpretation:  normal - no evidence of ischemia by ST analysis  Comments: Good exercise tolerance. No chest pain. Normal BP response to exercise. Artifact limits interpretation somewhat.  Overall, no significant ST-T changes to suggest ischemia.   Recommendations: Follow up with [name], MD as directed. Tereso Newcomer, PA-C  4:03 PM 04/17/2012

## 2012-06-30 ENCOUNTER — Ambulatory Visit: Payer: BC Managed Care – PPO | Admitting: Endocrinology

## 2012-08-11 ENCOUNTER — Other Ambulatory Visit: Payer: Self-pay

## 2012-08-11 MED ORDER — INSULIN LISPRO 100 UNIT/ML ~~LOC~~ SOLN: SUBCUTANEOUS | Status: DC

## 2012-10-20 ENCOUNTER — Encounter: Payer: Self-pay | Admitting: Endocrinology

## 2012-10-20 ENCOUNTER — Ambulatory Visit (INDEPENDENT_AMBULATORY_CARE_PROVIDER_SITE_OTHER): Payer: BC Managed Care – PPO | Admitting: Endocrinology

## 2012-10-20 VITALS — BP 132/74 | HR 90 | Wt 153.0 lb

## 2012-10-20 DIAGNOSIS — E109 Type 1 diabetes mellitus without complications: Secondary | ICD-10-CM

## 2012-10-20 MED ORDER — INSULIN LISPRO 100 UNIT/ML ~~LOC~~ SOLN: SUBCUTANEOUS | Status: DC

## 2012-10-20 NOTE — Progress Notes (Signed)
  Subjective:    Patient ID: Jasmine Schwartz, female    DOB: [birth year], 47 y.o.   MRN: [id number]  HPI Pt returns for f/u of type 1 DM (dx'ed 1990; no known complications; on pump rx since 2004; she declined continuous glucose monitor, due to cost).  no cbg record, but states cbg's are well-controlled.  pt states she feels well in general.  She has lost a few lbs, due to her efforts.  She has therefore had to reduce her pump settings. She averages 35 units/day, of humalog.   Past Medical History  Diagnosis Date  . Anxiety   . Depression   . Diabetes mellitus   . Dyslipidemia   . Anxiety and depression 07/06/2011    Past Surgical History  Procedure Laterality Date  . Tubal ligation    . Lumbar spine surgery  2009    Dr Jillyn Hidden    History   Social History  . Marital Status: Married    Spouse Name: N/A    Number of Children: N/A  . Years of Education: N/A   Occupational History  . Not on file.   Social History Main Topics  . Smoking status: Never Smoker   . Smokeless tobacco: Not on file  . Alcohol Use: Not on file  . Drug Use: Not on file  . Sexually Active: Not on file   Other Topics Concern  . Not on file   Social History Narrative  . No narrative on file    Current Outpatient Prescriptions on File Prior to Visit  Medication Sig Dispense Refill  . glucose blood (NOVA MAX TEST) test strip Use as directed up to 8 times a day      . Insulin Infusion Pump (MINIMED INSULIN PUMP) DEVI       . Insulin Infusion Pump Supplies (MINIMED INFUSION SET-MMT 397) MISC 1 Device by Does not apply route every 3 (three) days.      . Insulin Infusion Pump Supplies (PARADIGM PUMP RESERVOIR 1.76ML) MISC 1 Device by Does not apply route every 3 (three) days.       No current facility-administered medications on file prior to visit.    No Known Allergies  Family History  Problem Relation Age of Onset  . Cancer Neg Hx   . Diabetes Neg Hx   . Coronary artery disease Mother    BP  132/74  Pulse 90  Wt 153 lb (69.4 kg)  BMI 26.25 kg/m2  SpO2 96%  Review of Systems denies hypoglycemia    Objective:   Physical Exam VITAL SIGNS:  See vs page GENERAL: no distress SKIN:  Insulin infusion sites at the anterior abdomen are normal.     Lab Results  Component Value Date   HGBA1C 7.3* 10/20/2012      Assessment & Plan:  DM: needs increased rx

## 2012-10-20 NOTE — Patient Instructions (Addendum)
continue basal rate of 0.8 units/hr, 24 hrs per day.   continue mealtime bolus of 1 unit/ 15 grams carbohydrate  continue correction bolus (which some people call "sensitivity," or "insulin sensitivity ratio," or just "isr") of 1 unit for each 50 by which your glucose exceeds 100).   Please make a follow-up appointment in 3 months.

## 2012-12-23 ENCOUNTER — Encounter: Payer: Self-pay | Admitting: Endocrinology

## 2012-12-23 ENCOUNTER — Ambulatory Visit (INDEPENDENT_AMBULATORY_CARE_PROVIDER_SITE_OTHER): Payer: BC Managed Care – PPO | Admitting: Endocrinology

## 2012-12-23 VITALS — BP 132/80 | HR 90 | Ht 64.0 in | Wt 147.0 lb

## 2012-12-23 DIAGNOSIS — N6310 Unspecified lump in the right breast, unspecified quadrant: Secondary | ICD-10-CM

## 2012-12-23 DIAGNOSIS — N63 Unspecified lump in unspecified breast: Secondary | ICD-10-CM

## 2012-12-23 NOTE — Progress Notes (Signed)
  Subjective:    Patient ID: Jasmine Schwartz, female    DOB: September 05, 1965, 47 y.o.   MRN: [id number]  HPI Pt states 6 mos of a moderate lump at the right breast.  No assoc pain.  She had [place] and mammogram in [place], and was found to have a cyst.  However, it is much bigger now.   Past Medical History  Diagnosis Date  . Anxiety   . Depression   . Diabetes mellitus   . Dyslipidemia   . Anxiety and depression 07/06/2011    Past Surgical History  Procedure Laterality Date  . Tubal ligation    . Lumbar spine surgery  2009    Dr Jillyn Hidden    History   Social History  . Marital Status: Married    Spouse Name: N/A    Number of Children: N/A  . Years of Education: N/A   Occupational History  . Not on file.   Social History Main Topics  . Smoking status: Never Smoker   . Smokeless tobacco: Not on file  . Alcohol Use: Not on file  . Drug Use: Not on file  . Sexually Active: Not on file   Other Topics Concern  . Not on file   Social History Narrative  . No narrative on file    Current Outpatient Prescriptions on File Prior to Visit  Medication Sig Dispense Refill  . glucose blood (NOVA MAX TEST) test strip Use as directed up to 8 times a day      . Insulin Infusion Pump (MINIMED INSULIN PUMP) DEVI       . Insulin Infusion Pump Supplies (MINIMED INFUSION SET-MMT 397) MISC 1 Device by Does not apply route every 3 (three) days.      . Insulin Infusion Pump Supplies (PARADIGM PUMP RESERVOIR 1.76ML) MISC 1 Device by Does not apply route every 3 (three) days.      . insulin lispro (HUMALOG) 100 UNIT/ML injection Inject as directed into Insulin pump, total of 40 units/day  40 mL  11   No current facility-administered medications on file prior to visit.    No Known Allergies  Family History  Problem Relation Age of Onset  . Cancer Neg Hx   . Diabetes Neg Hx   . Coronary artery disease Mother     BP 132/80  Pulse 90  Ht 5\' 4"  (1.626 m)  Wt 147 lb (66.679 kg)  BMI 25.22 kg/m2   SpO2 97%   Review of Systems No galactorrhea    Objective:   Physical Exam VITAL SIGNS:  See vs page GENERAL: no distress Right breast: 10 cm firm mass, at 10 o'clock.  There is some ecchymosis on the overlying skin.  No nipple d/c.  No tenderness.       Assessment & Plan:  Breast cyst, bigger

## 2012-12-23 NOTE — Patient Instructions (Addendum)
Refer to a surgery specialist.  you will receive a phone call, about a day and time for an appointment.  

## 2012-12-29 ENCOUNTER — Telehealth (INDEPENDENT_AMBULATORY_CARE_PROVIDER_SITE_OTHER): Payer: Self-pay

## 2012-12-29 NOTE — Telephone Encounter (Signed)
I called and left a message for her to call me.  I need to confirm she is bringing her films and reports from [facility] to her appointment 5/21

## 2012-12-29 NOTE — Telephone Encounter (Signed)
The pt returned my call and she will pick up the films/ reports

## 2012-12-31 ENCOUNTER — Encounter (INDEPENDENT_AMBULATORY_CARE_PROVIDER_SITE_OTHER): Payer: Self-pay | Admitting: General Surgery

## 2012-12-31 ENCOUNTER — Ambulatory Visit (INDEPENDENT_AMBULATORY_CARE_PROVIDER_SITE_OTHER): Payer: BC Managed Care – PPO | Admitting: General Surgery

## 2012-12-31 VITALS — BP 116/70 | HR 86 | Temp 96.7°F | Resp 14 | Ht 64.0 in | Wt 146.8 lb

## 2012-12-31 DIAGNOSIS — N63 Unspecified lump in unspecified breast: Secondary | ICD-10-CM

## 2012-12-31 DIAGNOSIS — N6310 Unspecified lump in the right breast, unspecified quadrant: Secondary | ICD-10-CM

## 2012-12-31 NOTE — Patient Instructions (Signed)
Your examination today reveals an enlarging, discolored right breast mass and palpable lymph nodes in your right axilla.  You'll be scheduled for repeat imaging and probably image guided biopsy of the breast and axillary lymph nodes tomorrow at the breast center of [place].  You will return to see Dr. Derrell Lolling early next week for further discussion and planning.

## 2012-12-31 NOTE — Progress Notes (Signed)
Patient ID: Jasmine Schwartz, female   DOB: Jan 01, 1966, 47 y.o.   MRN: [id number]  Chief Complaint  Patient presents with  . New Evaluation    eval br mass    HPI Jasmine Schwartz is a 47 y.o. female.  She is referred by Dr. [name] for evaluation of an enlarging right breast mass. Her primary care physician in [place] is Dr. Fransico Michael.  This patient has insulin-dependent diabetes and is on an insulin pump. She has been followed by Dr. Everardo All for some time. She felt a lump in her right breast in August of 2013. There was no pain or nipple discharge. She had no prior breast problems. She had mammograms and right breast ultrasound at [facility] on 06/11/2012, read by Dr. Roswell Nickel. Images showed heterogeneously dense breasts, no focal abnormality, fibrocystic disease was felt to be the most likely finding, BI-RADS category 2, benign  Since October, the breast on the right side has gotten larger with a bluish discoloration and she feels a lump under her right arm. She has no problems in the left breast. She asked Dr. Everardo All to refer her.  Comorbidities include insulin dependent diabetes mellitus on insulin pump. She does not take any hormones. Family history is negative for breast or ovarian cancer. She is here today with her husband.  HPI  Past Medical History  Diagnosis Date  . Anxiety   . Depression   . Diabetes mellitus   . Dyslipidemia   . Anxiety and depression 07/06/2011    Past Surgical History  Procedure Laterality Date  . Lumbar spine surgery  2009    Dr Jillyn Hidden  . Cesarean section  1989  . Tubal ligation  1991    Family History  Problem Relation Age of Onset  . Cancer Neg Hx   . Diabetes Neg Hx   . Coronary artery disease Mother     Social History History  Substance Use Topics  . Smoking status: Never Smoker   . Smokeless tobacco: Not on file  . Alcohol Use: No    No Known Allergies  Current Outpatient Prescriptions  Medication Sig Dispense Refill  .  glucose blood (NOVA MAX TEST) test strip Use as directed up to 8 times a day      . Insulin Infusion Pump (MINIMED INSULIN PUMP) DEVI       . Insulin Infusion Pump Supplies (MINIMED INFUSION SET-MMT 397) MISC 1 Device by Does not apply route every 3 (three) days.      . Insulin Infusion Pump Supplies (PARADIGM PUMP RESERVOIR 1.76ML) MISC 1 Device by Does not apply route every 3 (three) days.      . insulin lispro (HUMALOG) 100 UNIT/ML injection Inject as directed into Insulin pump, total of 40 units/day  40 mL  11   No current facility-administered medications for this visit.    Review of Systems Review of Systems  Constitutional: Negative for fever, chills and unexpected weight change.  HENT: Negative for hearing loss, congestion, sore throat, trouble swallowing and voice change.   Eyes: Negative for visual disturbance.  Respiratory: Negative for cough and wheezing.   Cardiovascular: Negative for chest pain, palpitations and leg swelling.  Gastrointestinal: Negative for nausea, vomiting, abdominal pain, diarrhea, constipation, blood in stool, abdominal distention and anal bleeding.  Genitourinary: Negative for hematuria, vaginal bleeding and difficulty urinating.  Musculoskeletal: Negative for arthralgias.  Skin: Negative for rash and wound.  Neurological: Negative for seizures, syncope and headaches.  Hematological: Positive for adenopathy.  Bruises/bleeds easily.  Psychiatric/Behavioral: Negative for confusion. The patient is nervous/anxious.     Blood pressure 116/70, pulse 86, temperature 96.7 F (35.9 C), temperature source Temporal, resp. rate 14, height 5\' 4"  (1.626 m), weight 146 lb 12.8 oz (66.588 kg), SpO2 99.00%.  Physical Exam Physical Exam  Constitutional: She is oriented to person, place, and time. She appears well-developed and well-nourished. No distress.  HENT:  Head: Normocephalic and atraumatic.  Nose: Nose normal.  Mouth/Throat: No oropharyngeal exudate.  Eyes:  Conjunctivae and EOM are normal. Pupils are equal, round, and reactive to light. Left eye exhibits no discharge. No scleral icterus.  Neck: Neck supple. No JVD present. No tracheal deviation present. No thyromegaly present.  Cardiovascular: Normal rate, regular rhythm, normal heart sounds and intact distal pulses.   No murmur heard. Pulmonary/Chest: Effort normal and breath sounds normal. No respiratory distress. She has no wheezes. She has no rales. She exhibits no tenderness.    Right breast is clearly larger than the left. Significant mass effect centrally and extending to upper outer quadrant. Purplish bluish discoloration superiorly, laterally and inferiorly. Areola and nipple slightly edematous but not discolored. Palpable right axillary lymph nodes. Left breast soft. No palpable mass. No axillary adenopathy.  Abdominal: Soft. Bowel sounds are normal. She exhibits no distension and no mass. There is no tenderness. There is no rebound and no guarding.  Insulin pump  Musculoskeletal: She exhibits no edema and no tenderness.  Lymphadenopathy:    She has no cervical adenopathy.  Neurological: She is alert and oriented to person, place, and time. She exhibits normal muscle tone. Coordination normal.  Skin: Skin is warm. No rash noted. She is not diaphoretic. No erythema. No pallor.  Psychiatric: She has a normal mood and affect. Her behavior is normal. Judgment and thought content normal.    Data Reviewed I read the imaging reports from [facility] in October 2013, and I reviewed the mammogram films that were associated with this. I reviewed Dr. George Hugh office notes  Assessment    Rapidly enlarging, discolored right breast mass and right axillary adenopathy. My #1 concern is that this is a locally advanced, aggressive breast cancer.  Insulin-dependent diabetes mellitus  Anxiety and depression  Hyperlipidemia     Plan    I had a very long talk with the patient and her  husband. We discussed differential diagnosis. I told them that the immediate concern was to get a tissue diagnosis and they agreed. She'll be scheduled for imaging and image guided biopsy of her right breast and right axillary lymph nodes at the breast center [facility] tomorrow.   She will return to see me in a few days to discuss the pathology and next gout.  If this turns out to be a malignancy, then I think the next step is MRI, medical oncology consultation, genetics and probably neoadjuvant chemotherapy.       Angelia Mould. Derrell Lolling, M.D., [facility] General and Minimally invasive Surgery Breast and Colorectal Surgery Office:   [phone] Pager:   [phone]  12/31/2012, 5:26 PM

## 2013-01-01 ENCOUNTER — Other Ambulatory Visit (INDEPENDENT_AMBULATORY_CARE_PROVIDER_SITE_OTHER): Payer: Self-pay

## 2013-01-01 ENCOUNTER — Ambulatory Visit
Admission: RE | Admit: 2013-01-01 | Discharge: 2013-01-01 | Disposition: A | Discharge status: Home / Self Care | Payer: BC Managed Care – PPO | Source: Ambulatory Visit | Attending: General Surgery | Admitting: General Surgery

## 2013-01-01 ENCOUNTER — Other Ambulatory Visit (HOSPITAL_COMMUNITY): Payer: Self-pay | Admitting: Diagnostic Radiology

## 2013-01-01 ENCOUNTER — Other Ambulatory Visit (INDEPENDENT_AMBULATORY_CARE_PROVIDER_SITE_OTHER): Payer: Self-pay | Admitting: General Surgery

## 2013-01-01 DIAGNOSIS — N6310 Unspecified lump in the right breast, unspecified quadrant: Secondary | ICD-10-CM

## 2013-01-01 DIAGNOSIS — C50919 Malignant neoplasm of unspecified site of unspecified female breast: Secondary | ICD-10-CM

## 2013-01-01 DIAGNOSIS — Z171 Estrogen receptor negative status [ER-]: Secondary | ICD-10-CM | POA: Insufficient documentation

## 2013-01-01 DIAGNOSIS — C50411 Malignant neoplasm of upper-outer quadrant of right female breast: Secondary | ICD-10-CM | POA: Insufficient documentation

## 2013-01-01 HISTORY — PX: BREAST BIOPSY: SHX20

## 2013-01-01 HISTORY — DX: Malignant neoplasm of unspecified site of unspecified female breast: C50.919

## 2013-01-02 ENCOUNTER — Encounter (INDEPENDENT_AMBULATORY_CARE_PROVIDER_SITE_OTHER): Payer: Self-pay

## 2013-01-02 ENCOUNTER — Other Ambulatory Visit (INDEPENDENT_AMBULATORY_CARE_PROVIDER_SITE_OTHER): Payer: Self-pay | Admitting: General Surgery

## 2013-01-02 DIAGNOSIS — C50911 Malignant neoplasm of unspecified site of right female breast: Secondary | ICD-10-CM

## 2013-01-06 ENCOUNTER — Telehealth (INDEPENDENT_AMBULATORY_CARE_PROVIDER_SITE_OTHER): Payer: Self-pay

## 2013-01-06 DIAGNOSIS — C50911 Malignant neoplasm of unspecified site of right female breast: Secondary | ICD-10-CM

## 2013-01-06 NOTE — Telephone Encounter (Signed)
I called the pt and let her know Dr Derrell Lolling would like to make a med onc referral and refer her to genetic counseling.  She sees [place] Friday but I offered her to go ahead and get these referrals going.  She wants to proceed with anything he thinks she needs done.  She asked about the type of cancer she has and I read her pathology report to her.

## 2013-01-07 ENCOUNTER — Telehealth (INDEPENDENT_AMBULATORY_CARE_PROVIDER_SITE_OTHER): Payer: Self-pay

## 2013-01-07 NOTE — Telephone Encounter (Signed)
Message copied by Ivory Broad on Wed Jan 07, 2013 10:30 AM ------      Message from: Grace Isaac      Created: Wed Jan 07, 2013  8:47 AM      Contact: [phone]       Pt would like a call back. She has a few questions about the surgery she will be having. She also would like the name of the oncologists you mentioned yesterday.      Thanks! ------

## 2013-01-07 NOTE — Telephone Encounter (Signed)
I called the pt and she asked had Dr Derrell Lolling done this type of surgery before.  I told her he has been doing it for years and it's one of his specialties.  I told her the names of the oncologists  are Dr Darnelle Catalan and Dr Welton Flakes.

## 2013-01-08 ENCOUNTER — Telehealth: Payer: Self-pay | Admitting: Genetic Counselor

## 2013-01-08 NOTE — Telephone Encounter (Signed)
LVOM FOR PT RETURN CALL IN RE GENETIC APPT.

## 2013-01-09 ENCOUNTER — Ambulatory Visit (INDEPENDENT_AMBULATORY_CARE_PROVIDER_SITE_OTHER): Payer: BC Managed Care – PPO | Admitting: General Surgery

## 2013-01-09 ENCOUNTER — Ambulatory Visit (INDEPENDENT_AMBULATORY_CARE_PROVIDER_SITE_OTHER): Payer: BC Managed Care – PPO | Admitting: Endocrinology

## 2013-01-09 ENCOUNTER — Ambulatory Visit
Admission: RE | Admit: 2013-01-09 | Discharge: 2013-01-09 | Disposition: A | Discharge status: Home / Self Care | Payer: BC Managed Care – PPO | Source: Ambulatory Visit | Attending: General Surgery | Admitting: General Surgery

## 2013-01-09 ENCOUNTER — Encounter (INDEPENDENT_AMBULATORY_CARE_PROVIDER_SITE_OTHER): Payer: Self-pay | Admitting: General Surgery

## 2013-01-09 ENCOUNTER — Encounter: Payer: Self-pay | Admitting: Endocrinology

## 2013-01-09 ENCOUNTER — Other Ambulatory Visit (HOSPITAL_COMMUNITY): Payer: Self-pay | Admitting: Diagnostic Radiology

## 2013-01-09 ENCOUNTER — Other Ambulatory Visit (INDEPENDENT_AMBULATORY_CARE_PROVIDER_SITE_OTHER): Payer: Self-pay | Admitting: General Surgery

## 2013-01-09 ENCOUNTER — Telehealth: Payer: Self-pay | Admitting: Genetic Counselor

## 2013-01-09 VITALS — BP 124/65 | HR 72 | Temp 98.9°F | Resp 16 | Ht 64.0 in | Wt 144.2 lb

## 2013-01-09 VITALS — BP 124/80 | HR 78

## 2013-01-09 DIAGNOSIS — C50919 Malignant neoplasm of unspecified site of unspecified female breast: Secondary | ICD-10-CM

## 2013-01-09 DIAGNOSIS — F411 Generalized anxiety disorder: Secondary | ICD-10-CM

## 2013-01-09 DIAGNOSIS — C50911 Malignant neoplasm of unspecified site of right female breast: Secondary | ICD-10-CM

## 2013-01-09 DIAGNOSIS — E109 Type 1 diabetes mellitus without complications: Secondary | ICD-10-CM

## 2013-01-09 HISTORY — PX: BREAST BIOPSY: SHX20

## 2013-01-09 MED ORDER — GADOBENATE DIMEGLUMINE 529 MG/ML IV SOLN
13.0000 mL | Freq: Once | INTRAVENOUS | Status: AC | PRN
  Administered 2013-01-09: 13 mL via INTRAVENOUS

## 2013-01-09 MED ORDER — ALPRAZOLAM 0.25 MG PO TABS: 0.2500 mg | ORAL_TABLET | Freq: Three times a day (TID) | ORAL | Status: DC | PRN

## 2013-01-09 NOTE — Progress Notes (Signed)
  Subjective:    Patient ID: Jasmine Schwartz, female    DOB: [birth year], 47 y.o.   MRN: [id number]  HPI Pt states few weeks of severe anxiety, and associated insomnia.  She was recently dx'ed with rapidly-growing cancer of the left breast.   Past Medical History  Diagnosis Date  . Anxiety   . Depression   . Diabetes mellitus   . Dyslipidemia   . Anxiety and depression 07/06/2011    Past Surgical History  Procedure Laterality Date  . Lumbar spine surgery  2009    Dr Jillyn Hidden  . Cesarean section  1989  . Tubal ligation  1991    History   Social History  . Marital Status: Married    Spouse Name: N/A    Number of Children: N/A  . Years of Education: N/A   Occupational History  . Not on file.   Social History Main Topics  . Smoking status: Never Smoker   . Smokeless tobacco: Not on file  . Alcohol Use: No  . Drug Use: No  . Sexually Active: Not on file   Other Topics Concern  . Not on file   Social History Narrative  . No narrative on file    Current Outpatient Prescriptions on File Prior to Visit  Medication Sig Dispense Refill  . glucose blood (NOVA MAX TEST) test strip Use as directed up to 8 times a day      . Insulin Infusion Pump (MINIMED INSULIN PUMP) DEVI       . Insulin Infusion Pump Supplies (MINIMED INFUSION SET-MMT 397) MISC 1 Device by Does not apply route every 3 (three) days.      . Insulin Infusion Pump Supplies (PARADIGM PUMP RESERVOIR 1.76ML) MISC 1 Device by Does not apply route every 3 (three) days.      . insulin lispro (HUMALOG) 100 UNIT/ML injection Inject as directed into Insulin pump, total of 40 units/day  40 mL  11   No current facility-administered medications on file prior to visit.    No Known Allergies  Family History  Problem Relation Age of Onset  . Cancer Neg Hx   . Diabetes Neg Hx   . Coronary artery disease Mother     BP 124/80  Pulse 78  SpO2 98%  Review of Systems denies hypoglycemia and weight change    Objective:   Physical Exam VITAL SIGNS:  See vs page GENERAL: no distress PSYCH: Alert and oriented x 3.  Does not appear anxious nor depressed.     Assessment & Plan:  Anxiety, new, situational. DM: with upcoming cancer rx, her insulin requirements will fluctuate.

## 2013-01-09 NOTE — Progress Notes (Signed)
Patient ID: Jasmine Schwartz, female   DOB: 13-Aug-1966, 47 y.o.   MRN: [id number] History: This patient returns with her husband and mother to discuss her newly diagnosed, locally advanced cancer of the right breast. 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 There was metastatic cancer in 1 of 1 lymph nodes . Breast diagnostic profile is pending. MRI shows a large irregular enhancing mass at the 11:00 location of the right breast with metastatic right axillary lymphadenopathy and some skin thickening. In the left breast there were 2 small abnormal areas. They report a small subcentimeter density in the left breast at the 11:00 position and a small subcentimeter density in the left breast at the 2:00 position. These were both biopsied this afternoon and results are pending. We had a long talk about management of breast cancer with specific emphasis on her individual situation.   Exam: Patient is alert, in no distress. Anxious about treatment plans Neck no adenopathy or masses  right breast reveals large central upper outer quadrant mass and palpable axillary adenopathy but no hematoma. Left breast reveals small bruising from today's biopsies but no hematoma. No adenopathy Lungs clear auscultation Heart regular rate and rhythm. No murmur. No ectopy.   Assessment: Locally advanced and probably rapidly enlarging right breast cancer. Clinical stage T3(possibly T4), N1. Breast diagnostic protocol pending Abnormal MRI left breast, status post biopsy at 11:00 and 2:00 positions a day. Results pending Insulin-dependent diabetes mellitus Anxiety and depression Hyperlipidemia   Plan: We had a very long talk. I strongly suggested immediate medical oncology consultation which is being arranged. I discussed my recommendations for neoadjuvant chemotherapy to downsize the tumor and try to  facilitate mastectomy which would be difficult at this point in time but ultimately she will need a right modified radical mastectomy.  If she proves to have cancer in the left breast, and then she would ultimately probably want bilateral mastectomies.  I told her that chemotherapy would also provide systemic treatment. I told her that the medical oncologist would probably order staging scans such as PET CT and bone scan.  She is interested in reconstruction, and I told her that I would refer her to a plastic surgeon during her neoadjuvant chemotherapy  She is scheduled for genetic counseling on July 24 11:00 AM  We have contacted the cancer center and Misty Stanley or Alvis Lemmings will call the patient Monday to expedite her consultation with Dr. Darnelle Catalan or Dr. Welton Flakes.  We will standby pending her consultation with the medical oncologist. We will need to expedite her Port-A-Cath insertion, and that can be done either with one of my partners in my absence,  or by one of the radiologists.     Expediting this is the most important issue here.We discussed Port-A-Cath insertion and she is very familiar with this from some of her relatives.  She will be given appointment to return to see me in 3-4 weeks to make sure that things are moving along correctly.    Angelia Mould. Derrell Lolling, M.D., [facility] General and Minimally invasive Surgery Breast and Colorectal Surgery Office:   [phone] Pager:   [phone]

## 2013-01-09 NOTE — Telephone Encounter (Signed)
PT SCHEDULED TO SEE KAREN POWELL ON 07/24 @ 11 FOR GENETIC COUNSELING. WELCOME PACKET MAILED.

## 2013-01-09 NOTE — Patient Instructions (Addendum)
The biopsy of your right breast and your right axillary lymph nodes shows invasive ductal carcinoma, which is a breast cancer.  Your MRI also showed 2 suspicious areas in the left breast and both of those were biopsied today. We should have those reports next Monday.  Dr. Derrell Lolling advises immediate consultation with medical oncology to arrange and coordinate preoperative, or neoadjuvant chemotherapy to shrink the tumor down and to treat her entire body. Lisa or bone at the cancer center will call you Monday to finalize the scheduling of that appointment.  Once the oncologist Makes their decision, a Port-A-Cath will need to be placed. Our office we'll expedite the scheduling of that with one of my partners or with one of the radiologists.  Ultimately, he will need an operation on your right breast, and if there is cancer in the left breast, the left breast as well.  We will discuss reconstructive options and referral to a plastic surgeon at the next office visit.  You have an appointment with genetics counselor  on July 24 at 11:00 AM at the cancer center.  Return to see Dr. Derrell Lolling in 3-4 weeks.

## 2013-01-09 NOTE — Patient Instructions (Addendum)
Here is a prescription for the anxiety.  Call if it does not help. As you go through the chemo and surgery, your insulin requirements may change drastically.  Therefore, please check the sugar carefully, and call if we need to adjust your pump settings.

## 2013-01-10 ENCOUNTER — Telehealth: Payer: Self-pay | Admitting: *Deleted

## 2013-01-10 NOTE — Telephone Encounter (Signed)
Confirmed 01/12/13 appt w/ pt.  Unable to mail before letter & packet - gave verbal and put in the appt notes to please give pt packet.  Emailed Huntley Dec at [facility] to make her aware.  No time for Med Rec to make chart - made myself.  Placed in Dawn's office to enter labs when she gets in on Monday.

## 2013-01-11 HISTORY — PX: PORTACATH PLACEMENT: SHX2246

## 2013-01-12 ENCOUNTER — Other Ambulatory Visit (HOSPITAL_BASED_OUTPATIENT_CLINIC_OR_DEPARTMENT_OTHER): Payer: BC Managed Care – PPO | Admitting: Lab

## 2013-01-12 ENCOUNTER — Telehealth: Payer: Self-pay | Admitting: Oncology

## 2013-01-12 ENCOUNTER — Encounter: Payer: Self-pay | Admitting: Oncology

## 2013-01-12 ENCOUNTER — Ambulatory Visit: Payer: BC Managed Care – PPO

## 2013-01-12 ENCOUNTER — Other Ambulatory Visit: Payer: Self-pay | Admitting: Radiology

## 2013-01-12 ENCOUNTER — Telehealth: Payer: Self-pay | Admitting: *Deleted

## 2013-01-12 ENCOUNTER — Other Ambulatory Visit: Payer: Self-pay | Admitting: *Deleted

## 2013-01-12 ENCOUNTER — Ambulatory Visit (HOSPITAL_BASED_OUTPATIENT_CLINIC_OR_DEPARTMENT_OTHER): Payer: BC Managed Care – PPO | Admitting: Oncology

## 2013-01-12 VITALS — BP 112/69 | HR 94 | Temp 98.6°F | Resp 20 | Ht 64.0 in | Wt 146.0 lb

## 2013-01-12 DIAGNOSIS — C773 Secondary and unspecified malignant neoplasm of axilla and upper limb lymph nodes: Secondary | ICD-10-CM

## 2013-01-12 DIAGNOSIS — C50419 Malignant neoplasm of upper-outer quadrant of unspecified female breast: Secondary | ICD-10-CM

## 2013-01-12 DIAGNOSIS — C50911 Malignant neoplasm of unspecified site of right female breast: Secondary | ICD-10-CM

## 2013-01-12 DIAGNOSIS — C50919 Malignant neoplasm of unspecified site of unspecified female breast: Secondary | ICD-10-CM

## 2013-01-12 LAB — CBC WITH DIFFERENTIAL/PLATELET
BASO%: 0.9 % (ref 0.0–2.0)
Basophils Absolute: 0.1 10*3/uL (ref 0.0–0.1)
HCT: 38 % (ref 34.8–46.6)
LYMPH%: 20.1 % (ref 14.0–49.7)
MCHC: 34.2 g/dL (ref 31.5–36.0)
MONO#: 0.5 10*3/uL (ref 0.1–0.9)
NEUT%: 71.4 % (ref 38.4–76.8)
Platelets: 328 10*3/uL (ref 145–400)
WBC: 7.9 10*3/uL (ref 3.9–10.3)

## 2013-01-12 LAB — COMPREHENSIVE METABOLIC PANEL (CC13)
ALT: 9 U/L (ref 0–55)
BUN: 9.5 mg/dL (ref 7.0–26.0)
CO2: 28 mEq/L (ref 22–29)
Creatinine: 0.8 mg/dL (ref 0.6–1.1)
Glucose: 167 mg/dl — ABNORMAL HIGH (ref 70–99)
Total Bilirubin: 0.89 mg/dL (ref 0.20–1.20)

## 2013-01-12 MED ORDER — PROCHLORPERAZINE MALEATE 10 MG PO TABS: 10.0000 mg | ORAL_TABLET | Freq: Four times a day (QID) | ORAL | Status: DC | PRN

## 2013-01-12 MED ORDER — ONDANSETRON HCL 8 MG PO TABS: 8.0000 mg | ORAL_TABLET | Freq: Two times a day (BID) | ORAL | Status: DC

## 2013-01-12 MED ORDER — LORAZEPAM 0.5 MG PO TABS: 0.5000 mg | ORAL_TABLET | Freq: Four times a day (QID) | ORAL | Status: DC | PRN

## 2013-01-12 MED ORDER — LIDOCAINE-PRILOCAINE 2.5-2.5 % EX CREA: TOPICAL_CREAM | CUTANEOUS | Status: DC | PRN

## 2013-01-12 MED ORDER — PROCHLORPERAZINE 25 MG RE SUPP: 25.0000 mg | Freq: Two times a day (BID) | RECTAL | Status: DC | PRN

## 2013-01-12 MED ORDER — DEXAMETHASONE 4 MG PO TABS: 8.0000 mg | ORAL_TABLET | Freq: Two times a day (BID) | ORAL | Status: DC

## 2013-01-12 NOTE — Patient Instructions (Addendum)
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  We discussed neoadjuvant chemotherapy consisting of Taxotere carboplatinum Herceptin and perjeta to be given every 3 weeks beginning 01/15/2013.  We discussed getting a Port-A-Cath placed, echocardiogram, Cardia oncology consultation, PET CT for staging purposes,  We will begin chemotherapy starting 01/15/2013.

## 2013-01-12 NOTE — Telephone Encounter (Signed)
Called pt to let her know that we need to see her sooner today and confirmed that she could come in at 12pm.  Made everyone in every department that is involved aware.

## 2013-01-12 NOTE — Progress Notes (Signed)
Checked in new patient and gave [facility]. No financial issues.

## 2013-01-12 NOTE — Telephone Encounter (Signed)
gv pt appt schedule for June thru July including appts for echo/Dr. Gala Romney, Dr. Michell Heinrich and port placement appt. While in medonc scheduling central scheduling called pt to schedule pet/ct and ran into issues because pt has a continuious pump for insulin that she cannot stop for more than . Pt s/w both central scheduling and Tina in IR re this. Provider will need to call radiologist re what to do about pump due to insulin interferes w/pet. Desk nurse informed and per desk nurse primary will need to advise re pump per KK. Per desk nurse she will contact primary (Dr. [name]). Number for primary provided by pt. Desk nurse will let me know when this has been resolved. Pt aware.

## 2013-01-13 ENCOUNTER — Other Ambulatory Visit: Payer: BC Managed Care – PPO

## 2013-01-13 ENCOUNTER — Encounter (HOSPITAL_COMMUNITY): Payer: Self-pay | Admitting: Pharmacy Technician

## 2013-01-13 ENCOUNTER — Encounter: Payer: Self-pay | Admitting: *Deleted

## 2013-01-13 ENCOUNTER — Ambulatory Visit (HOSPITAL_COMMUNITY)
Admission: RE | Admit: 2013-01-13 | Discharge: 2013-01-13 | Disposition: A | Discharge status: Home / Self Care | Payer: BC Managed Care – PPO | Source: Ambulatory Visit | Attending: Oncology | Admitting: Oncology

## 2013-01-13 ENCOUNTER — Telehealth: Payer: Self-pay

## 2013-01-13 DIAGNOSIS — Z5111 Encounter for antineoplastic chemotherapy: Secondary | ICD-10-CM

## 2013-01-13 DIAGNOSIS — C50911 Malignant neoplasm of unspecified site of right female breast: Secondary | ICD-10-CM

## 2013-01-13 DIAGNOSIS — C50919 Malignant neoplasm of unspecified site of unspecified female breast: Secondary | ICD-10-CM | POA: Insufficient documentation

## 2013-01-13 NOTE — Telephone Encounter (Signed)
Meriam at Dr. Santo Held office, he would like pt to have a Ct and Pet scan, but Dr. Ty Hilts radiologist would need to speak with you regarding patient's insulin pump, please call to discuss [phone]

## 2013-01-13 NOTE — Telephone Encounter (Signed)
Please call meriam back.  i discussed with dr Amil Amen.  He says pt can keep pump on during pet scan.

## 2013-01-13 NOTE — Progress Notes (Signed)
  Echocardiogram 2D Echocardiogram has been performed.  Georgian Co 01/13/2013, 3:46 PM

## 2013-01-13 NOTE — Telephone Encounter (Signed)
Left message for meriam

## 2013-01-14 ENCOUNTER — Other Ambulatory Visit (HOSPITAL_COMMUNITY): Payer: BC Managed Care – PPO

## 2013-01-14 ENCOUNTER — Telehealth (INDEPENDENT_AMBULATORY_CARE_PROVIDER_SITE_OTHER): Payer: Self-pay

## 2013-01-14 ENCOUNTER — Encounter: Payer: Self-pay | Admitting: Radiation Oncology

## 2013-01-14 NOTE — Progress Notes (Addendum)
Location of Breast Cancer: RIGHT 11 o'clock  Histology per Pathology Report:  5 /22/14:Diagnosis1. Breast, right, needle core biopsy, mass- INVASIVE MAMMARY CARCINOMA.- SEE COMMENT.2. Lymph node, needle/core biopsy, right axilla- METASTATIC CARCINOMA IN 1 OF 1 LYMPH NODE (1/1). Receptor Status: ER(-), PR (-), Her2-neu ( )  Did patient present with symptoms (if so, please note symptoms) or was this found on screening mammography?: patient  felt lump right breast 03/2012, had mammogram and U/S right breast  At [facility] 06/11/12=dense breasts,no focal abnormality,fibrocystic disease felt to be the most likely finding, BI-RADS category 2,benign read by DR. Rubner, since October right breast larger with bluish discoloration and she felt lump under right arm,  Past/Anticipated interventions by surgeon, if any: 01/09/13:Diagnosis1. Breast, left, needle core biopsy, 11:00- FIBROADENOMA, SEE COMMENT.- MICROCALCIFICATIONS IDENTIFIED. Dr. Claud Kelp 2. Breast, left, needle core biopsy, 2:00- FIBROADENOMA, SEE COMMENT.- MICROCALCIFICATIONS IDENTIFIED/   01/01/13. Breast, right, needle core biopsy, mass- INVASIVE MAMMARY CARCINOMA.- SEE COMMENT.2. Lymph node, needle/core biopsy, right axilla- METASTATIC CARCINOMA IN 1 OF 1 LYMPH NODE (1/1). Patient is interested in reconstruction, power port ,   porta cath placement placed  Left subclavian 01/15/13 ,  Past/Anticipated interventions by medical oncology, if any: appt with Dr.Khan 01/15/13 @ 1130am, scheduled for genetic counseling 7/24 2014 1100am, starting first infusion of Chemotherapy today Pet scan scheduled 01/20/13,  Lymphedema issues, if any:  Pain issues, if any:   SAFETY ISSUES:  Prior radiation? NO  Pacemaker/ICD? NO  Possible current pregnancy? NO Is the patient on methotrexate? 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 [name], Gyn  is Dr.Kim Lockie Mola  Allergies:NKDA

## 2013-01-14 NOTE — Telephone Encounter (Signed)
I called the pt to schedule her to come back in and follow up with Dr Derrell Lolling during her oncology treatment.  I scheduled her to come in July 1st due to her chemo schedule.  I asked her about her portacath.  She states she is getting it put in tomorrow by radiology in Dr Jacinto Halim absence.

## 2013-01-15 ENCOUNTER — Encounter (HOSPITAL_COMMUNITY): Payer: Self-pay

## 2013-01-15 ENCOUNTER — Ambulatory Visit (HOSPITAL_COMMUNITY)
Admission: RE | Admit: 2013-01-15 | Discharge: 2013-01-15 | Disposition: A | Discharge status: Home / Self Care | Payer: BC Managed Care – PPO | Source: Ambulatory Visit | Attending: Oncology | Admitting: Oncology

## 2013-01-15 ENCOUNTER — Other Ambulatory Visit (HOSPITAL_BASED_OUTPATIENT_CLINIC_OR_DEPARTMENT_OTHER): Payer: BC Managed Care – PPO | Admitting: Lab

## 2013-01-15 ENCOUNTER — Other Ambulatory Visit: Payer: Self-pay | Admitting: Oncology

## 2013-01-15 ENCOUNTER — Ambulatory Visit (HOSPITAL_BASED_OUTPATIENT_CLINIC_OR_DEPARTMENT_OTHER): Payer: BC Managed Care – PPO | Admitting: Oncology

## 2013-01-15 ENCOUNTER — Telehealth: Payer: Self-pay | Admitting: *Deleted

## 2013-01-15 ENCOUNTER — Encounter: Payer: Self-pay | Admitting: Oncology

## 2013-01-15 VITALS — BP 103/71 | HR 90 | Temp 98.2°F | Resp 20 | Ht 64.0 in | Wt 145.5 lb

## 2013-01-15 DIAGNOSIS — C773 Secondary and unspecified malignant neoplasm of axilla and upper limb lymph nodes: Secondary | ICD-10-CM

## 2013-01-15 DIAGNOSIS — Z794 Long term (current) use of insulin: Secondary | ICD-10-CM | POA: Insufficient documentation

## 2013-01-15 DIAGNOSIS — C50419 Malignant neoplasm of upper-outer quadrant of unspecified female breast: Secondary | ICD-10-CM

## 2013-01-15 DIAGNOSIS — E785 Hyperlipidemia, unspecified: Secondary | ICD-10-CM | POA: Insufficient documentation

## 2013-01-15 DIAGNOSIS — C50911 Malignant neoplasm of unspecified site of right female breast: Secondary | ICD-10-CM

## 2013-01-15 DIAGNOSIS — Z79899 Other long term (current) drug therapy: Secondary | ICD-10-CM | POA: Insufficient documentation

## 2013-01-15 DIAGNOSIS — Z171 Estrogen receptor negative status [ER-]: Secondary | ICD-10-CM

## 2013-01-15 DIAGNOSIS — C50919 Malignant neoplasm of unspecified site of unspecified female breast: Secondary | ICD-10-CM | POA: Insufficient documentation

## 2013-01-15 DIAGNOSIS — E119 Type 2 diabetes mellitus without complications: Secondary | ICD-10-CM | POA: Insufficient documentation

## 2013-01-15 LAB — CBC WITH DIFFERENTIAL/PLATELET
EOS%: 2.6 % (ref 0.0–7.0)
LYMPH%: 18.3 % (ref 14.0–49.7)
MCH: 29.9 pg (ref 25.1–34.0)
MCHC: 33.9 g/dL (ref 31.5–36.0)
MCV: 88.2 fL (ref 79.5–101.0)
MONO%: 7.7 % (ref 0.0–14.0)
Platelets: 309 10*3/uL (ref 145–400)
RBC: 4.25 10*6/uL (ref 3.70–5.45)
RDW: 12.9 % (ref 11.2–14.5)

## 2013-01-15 LAB — COMPREHENSIVE METABOLIC PANEL (CC13)
AST: 11 U/L (ref 5–34)
Albumin: 3.3 g/dL — ABNORMAL LOW (ref 3.5–5.0)
Alkaline Phosphatase: 43 U/L (ref 40–150)
Glucose: 145 mg/dl — ABNORMAL HIGH (ref 70–99)
Potassium: 3.7 mEq/L (ref 3.5–5.1)
Sodium: 140 mEq/L (ref 136–145)
Total Bilirubin: 0.66 mg/dL (ref 0.20–1.20)
Total Protein: 6.5 g/dL (ref 6.4–8.3)

## 2013-01-15 LAB — GLUCOSE, CAPILLARY: Glucose-Capillary: 66 mg/dL — ABNORMAL LOW (ref 70–99)

## 2013-01-15 LAB — PROTIME-INR
INR: 1.05 (ref 0.00–1.49)
Prothrombin Time: 13.6 seconds (ref 11.6–15.2)

## 2013-01-15 MED ORDER — MIDAZOLAM HCL 2 MG/2ML IJ SOLN
INTRAMUSCULAR | Status: AC
  Filled 2013-01-15: qty 6

## 2013-01-15 MED ORDER — FENTANYL CITRATE 0.05 MG/ML IJ SOLN
INTRAMUSCULAR | Status: AC
  Filled 2013-01-15: qty 6

## 2013-01-15 MED ORDER — LIDOCAINE-PRILOCAINE 2.5-2.5 % EX CREA: TOPICAL_CREAM | CUTANEOUS | Status: DC | PRN

## 2013-01-15 MED ORDER — SODIUM CHLORIDE 0.9 % IV SOLN
INTRAVENOUS | Status: DC
  Administered 2013-01-15: 12:00:00 via INTRAVENOUS

## 2013-01-15 MED ORDER — PROCHLORPERAZINE MALEATE 10 MG PO TABS: 10.0000 mg | ORAL_TABLET | Freq: Four times a day (QID) | ORAL | Status: DC | PRN

## 2013-01-15 MED ORDER — LIDOCAINE HCL 1 % IJ SOLN
INTRAMUSCULAR | Status: AC
  Filled 2013-01-15: qty 20

## 2013-01-15 MED ORDER — MIDAZOLAM HCL 2 MG/2ML IJ SOLN
INTRAMUSCULAR | Status: AC | PRN
  Administered 2013-01-15 (×5): 1 mg via INTRAVENOUS

## 2013-01-15 MED ORDER — CEFAZOLIN SODIUM-DEXTROSE 2-3 GM-% IV SOLR
2.0000 g | INTRAVENOUS | Status: DC
  Filled 2013-01-15: qty 50

## 2013-01-15 MED ORDER — HEPARIN SOD (PORK) LOCK FLUSH 100 UNIT/ML IV SOLN
INTRAVENOUS | Status: AC | PRN
  Administered 2013-01-15: 500 [IU]

## 2013-01-15 MED ORDER — FENTANYL CITRATE 0.05 MG/ML IJ SOLN
INTRAMUSCULAR | Status: AC | PRN
  Administered 2013-01-15: 100 ug via INTRAVENOUS
  Administered 2013-01-15 (×3): 50 ug via INTRAVENOUS

## 2013-01-15 NOTE — Telephone Encounter (Signed)
Per staff message and POF I have scheduled appts.  [name]  

## 2013-01-15 NOTE — H&P (Signed)
Chief Complaint: "I'm here for a port" Referring Physician:Khan HPI: Jasmine Schwartz is an 47 y.o. female with newly diagnosed breast cancer on the right. She is to start chemotherapy and then have surgery and radiation. She is scheduled with IR today for port placement. PMHx and meds reviewed. She otherwise feels well. DM controlled with insulin pump, glucose is 70 at last check  Past Medical History:  Past Medical History  Diagnosis Date  . Anxiety   . Depression   . Dyslipidemia   . Anxiety and depression 07/06/2011  . Breast cancer 01/01/13    /metastatic 1/1 lymph nodes  . Diabetes mellitus     IDDM  . Hx of insertion of insulin pump     Past Surgical History:  Past Surgical History  Procedure Laterality Date  . Lumbar spine surgery  2009    Dr Jillyn Hidden  . Cesarean section  1989  . Tubal ligation  1991  . Breast biopsy Right 01/01/13    Invasive mammary ca,metastatic in 1/1 lymph node  . Breast biopsy Left 01/09/13    Fibroadenoma,microcalcifications    Family History:  Family History  Problem Relation Age of Onset  . Cancer Neg Hx   . Diabetes Neg Hx   . Coronary artery disease Mother     Social History:  reports that she has never smoked. She does not have any smokeless tobacco history on file. She reports that she does not drink alcohol or use illicit drugs.  Allergies: No Known Allergies  Medications: ALPRAZolam (XANAX) 0.25 MG tablet (Taking) 01/09/2013 Sig - Route: Take 0.25 mg by mouth 3 (three) times daily as needed for sleep or anxiety. - Oral Class: Historical Med dexamethasone (DECADRON) 4 MG tablet (Taking) 01/12/2013 Sig - Route: Take 8 mg by mouth 2 (two) times daily with a meal. Take two times a day the day before Taxotere. Then take two times a day starting the day after chemo for 3 days. - Oral Class: Historical Med glucose blood (NOVA MAX TEST) test strip (Taking) Sig: Use as directed up to 8 times a day Class: Historical Med Number of times this order has  been changed since signing: 2 Order [address] Insulin Infusion Pump (MINIMED INSULIN PUMP) DEVI (Taking) Sig - Route: Inject 1 Units/hr into the vein continuous. Pt give extra per cards at meal times. Basic meals are about 5 units - Intravenous Class: Historical Med Number of times this order has been changed since signing: 3 Order [address] Insulin Infusion Pump Supplies (MINIMED INFUSION SET-MMT 397) MISC (Taking) Sig - Route: 1 Device by Does not apply route every 3 (three) days. - Does not apply Class: Historical Med Number of times this order has been changed since signing: 1 Order [address] Insulin Infusion Pump Supplies (PARADIGM PUMP RESERVOIR 1.76ML) MISC (Taking) Sig - Route: 1 Device by Does not apply route every 3 (three) days. - Does not apply Class: Historical Med Number of times this order has been changed since signing: 1 Order [address] insulin lispro (HUMALOG) 100 UNIT/ML injection (Taking) 10/20/2012 Sig: Inject as directed into Insulin pump, total of 40 units/day Class: Historical Med LORazepam (ATIVAN) 0.5 MG tablet (Taking) 01/12/2013 Sig - Route: Take 0.5 mg by mouth every 6 (six) hours as needed (Nausea or vomiting). - Oral Class: Historical Med ondansetron (ZOFRAN) 8 MG tablet (Taking) 01/12/2013 Sig - Route: Take 8 mg by mouth 2 (two) times daily.   Please HPI for pertinent positives, otherwise complete 10 system  ROS negative.  Physical Exam: Temp: 98.1, BP: 115/68, RR: 16, HR: 89 O2:100% LMP 01/04/2013    General Appearance:  Alert, cooperative, no distress, appears stated age  Head:  Normocephalic, without obvious abnormality, atraumatic  ENT: Unremarkable  Neck: Supple, symmetrical, trachea midline  Lungs:   Clear to auscultation bilaterally, no w/r/r, respirations unlabored without use of accessory muscles.  Chest Wall:  No tenderness or deformity  Heart:  Regular rate and rhythm, S1, S2 normal, no murmur, rub or gallop.  Neurologic: Normal affect, no gross deficits.    Results for orders placed during the hospital encounter of 01/15/13 (from the past 48 hour(s))  APTT     Status: None   Collection Time    01/15/13 11:40 AM      Result Value Range   aPTT 29  24 - 37 seconds  PROTIME-INR     Status: None   Collection Time    01/15/13 11:40 AM      Result Value Range   Prothrombin Time 13.6  11.6 - 15.2 seconds   INR 1.05  0.00 - 1.49   CBC    Component Value Date/Time   WBC 6.8 01/15/2013 0859   RBC 4.25 01/15/2013 0859   HGB 12.7 01/15/2013 0859   HCT 37.5 01/15/2013 0859   PLT 309 01/15/2013 0859    Assessment/Plan Breast cancer For port placement. Discussed procedure, risks, complications, use of sedation Labs reviewed. Consent signed in chart  Brayton El PA-C 01/15/2013, 12:55 PM

## 2013-01-15 NOTE — Progress Notes (Signed)
Sugar is 119.  Pt is leaving insulin pump off.

## 2013-01-15 NOTE — Progress Notes (Signed)
Spoke to [employer], [place] about pt having insulin pump.  He states she can leave it on for the procedure.

## 2013-01-15 NOTE — Patient Instructions (Addendum)
Proceed with chemotherapy on 01/16/13  neulasta inj on 01/17/13

## 2013-01-15 NOTE — Progress Notes (Signed)
Pt rechecked her blood sugar and it was 70.  Pt still has pump off for now.  Pt states she is going to recheck it again soon.  Pt still asymtomatic, alert and awake.  Informed Brayton El, PA that sugar had came up.

## 2013-01-15 NOTE — Progress Notes (Signed)
Blood sugar is 66, pt states she is going to leave her pump off for now and she will recheck her sugar at 1210.  Pt is asymtomatic at this time.  Pt is awake and alert.  Faith Rogue, PA notified of this.

## 2013-01-15 NOTE — Progress Notes (Signed)
Pt sugar is 208.  Pt preferred to use her own machine.  Pt is turning on her insulin pump at this time.

## 2013-01-15 NOTE — Procedures (Signed)
LIJV PAC tip SVC RA No comp 

## 2013-01-16 ENCOUNTER — Ambulatory Visit
Admission: RE | Admit: 2013-01-16 | Discharge: 2013-01-16 | Disposition: A | Discharge status: Home / Self Care | Payer: BC Managed Care – PPO | Source: Ambulatory Visit | Attending: Radiation Oncology | Admitting: Radiation Oncology

## 2013-01-16 ENCOUNTER — Ambulatory Visit (HOSPITAL_BASED_OUTPATIENT_CLINIC_OR_DEPARTMENT_OTHER): Payer: BC Managed Care – PPO

## 2013-01-16 ENCOUNTER — Encounter: Payer: Self-pay | Admitting: Radiation Oncology

## 2013-01-16 VITALS — BP 113/64 | HR 99 | Temp 98.1°F | Resp 20 | Ht 64.0 in | Wt 147.7 lb

## 2013-01-16 VITALS — BP 131/76 | HR 79 | Temp 98.4°F | Resp 20

## 2013-01-16 DIAGNOSIS — C50911 Malignant neoplasm of unspecified site of right female breast: Secondary | ICD-10-CM

## 2013-01-16 DIAGNOSIS — Z794 Long term (current) use of insulin: Secondary | ICD-10-CM | POA: Insufficient documentation

## 2013-01-16 DIAGNOSIS — C50419 Malignant neoplasm of upper-outer quadrant of unspecified female breast: Secondary | ICD-10-CM

## 2013-01-16 DIAGNOSIS — E119 Type 2 diabetes mellitus without complications: Secondary | ICD-10-CM | POA: Insufficient documentation

## 2013-01-16 DIAGNOSIS — Z5111 Encounter for antineoplastic chemotherapy: Secondary | ICD-10-CM

## 2013-01-16 DIAGNOSIS — Z5112 Encounter for antineoplastic immunotherapy: Secondary | ICD-10-CM

## 2013-01-16 DIAGNOSIS — C50919 Malignant neoplasm of unspecified site of unspecified female breast: Secondary | ICD-10-CM | POA: Insufficient documentation

## 2013-01-16 DIAGNOSIS — C773 Secondary and unspecified malignant neoplasm of axilla and upper limb lymph nodes: Secondary | ICD-10-CM

## 2013-01-16 HISTORY — DX: Malignant neoplasm of unspecified site of unspecified female breast: C50.919

## 2013-01-16 MED ORDER — LORAZEPAM 2 MG/ML IJ SOLN
0.5000 mg | Freq: Once | INTRAMUSCULAR | Status: AC
  Administered 2013-01-16: 0.5 mg via INTRAVENOUS

## 2013-01-16 MED ORDER — ONDANSETRON 16 MG/50ML IVPB (CHCC)
16.0000 mg | Freq: Once | INTRAVENOUS | Status: AC
  Administered 2013-01-16: 16 mg via INTRAVENOUS

## 2013-01-16 MED ORDER — DEXAMETHASONE SODIUM PHOSPHATE 20 MG/5ML IJ SOLN
20.0000 mg | Freq: Once | INTRAMUSCULAR | Status: AC
  Administered 2013-01-16: 20 mg via INTRAVENOUS

## 2013-01-16 MED ORDER — DIPHENHYDRAMINE HCL 25 MG PO CAPS
50.0000 mg | ORAL_CAPSULE | Freq: Once | ORAL | Status: AC
  Administered 2013-01-16: 50 mg via ORAL

## 2013-01-16 MED ORDER — ACETAMINOPHEN 325 MG PO TABS
650.0000 mg | ORAL_TABLET | Freq: Once | ORAL | Status: AC
  Administered 2013-01-16: 650 mg via ORAL

## 2013-01-16 MED ORDER — SODIUM CHLORIDE 0.9 % IV SOLN
75.0000 mg/m2 | Freq: Once | INTRAVENOUS | Status: AC
  Administered 2013-01-16: 130 mg via INTRAVENOUS
  Filled 2013-01-16: qty 13

## 2013-01-16 MED ORDER — SODIUM CHLORIDE 0.9 % IV SOLN
840.0000 mg | Freq: Once | INTRAVENOUS | Status: AC
  Administered 2013-01-16: 840 mg via INTRAVENOUS
  Filled 2013-01-16: qty 28

## 2013-01-16 MED ORDER — TRASTUZUMAB CHEMO INJECTION 440 MG
8.0000 mg/kg | Freq: Once | INTRAVENOUS | Status: AC
  Administered 2013-01-16: 525 mg via INTRAVENOUS
  Filled 2013-01-16: qty 25

## 2013-01-16 MED ORDER — SODIUM CHLORIDE 0.9 % IV SOLN
Freq: Once | INTRAVENOUS | Status: AC
  Administered 2013-01-16: 11:00:00 via INTRAVENOUS

## 2013-01-16 MED ORDER — HEPARIN SOD (PORK) LOCK FLUSH 100 UNIT/ML IV SOLN
500.0000 [IU] | Freq: Once | INTRAVENOUS | Status: AC | PRN
  Administered 2013-01-16: 500 [IU]
  Filled 2013-01-16: qty 5

## 2013-01-16 MED ORDER — SODIUM CHLORIDE 0.9 % IV SOLN
695.4000 mg | Freq: Once | INTRAVENOUS | Status: AC
  Administered 2013-01-16: 700 mg via INTRAVENOUS
  Filled 2013-01-16: qty 70

## 2013-01-16 MED ORDER — SODIUM CHLORIDE 0.9 % IJ SOLN
10.0000 mL | INTRAMUSCULAR | Status: DC | PRN
  Administered 2013-01-16: 10 mL
  Filled 2013-01-16: qty 10

## 2013-01-16 NOTE — Patient Instructions (Signed)
[facility] Discharge Instructions for Patients Receiving Chemotherapy  Today you received the following chemotherapy agents: Taxotere, Carboplatin, Herceptin, Perjeta   To help prevent nausea and vomiting after your treatment, we encourage you to take your nausea medication as prescribed.   If you develop nausea and vomiting that is not controlled by your nausea medication, call the clinic.   BELOW ARE SYMPTOMS THAT SHOULD BE REPORTED IMMEDIATELY:  *FEVER GREATER THAN 100.5 F  *CHILLS WITH OR WITHOUT FEVER  NAUSEA AND VOMITING THAT IS NOT CONTROLLED WITH YOUR NAUSEA MEDICATION  *UNUSUAL SHORTNESS OF BREATH  *UNUSUAL BRUISING OR BLEEDING  TENDERNESS IN MOUTH AND THROAT WITH OR WITHOUT PRESENCE OF ULCERS  *URINARY PROBLEMS  *BOWEL PROBLEMS  UNUSUAL RASH Items with * indicate a potential emergency and should be followed up as soon as possible.  Feel free to call the clinic you have any questions or concerns. The clinic phone number is [phone].    

## 2013-01-16 NOTE — Progress Notes (Signed)
Please see the Nurse Progress Note in the MD Initial Consult Encounter for this patient. 

## 2013-01-16 NOTE — Progress Notes (Signed)
Radiation Oncology         [phone]) [phone] ________________________________  Initial outpatient Consultation - Date: 01/16/2013   Name: Jasmine Schwartz MRN: [id number]   DOB: 1966-03-04  REFERRING PHYSICIAN: Victorino December, MD  DIAGNOSIS: The encounter diagnosis was Cancer of right breast.  HISTORY OF PRESENT ILLNESS::Jasmine Schwartz is a 47 y.o. female  who palpated a right breast mass in October of last year. Imaging findings at that time were negative. Her right breast continued increase in size. She thought this was a cyst and benign so she did not seek medical attention. When it finally became extremely painful and she began to feel and not underneath her arm she sought medical attention again. At this time physical exam and mammogram showed a large right breast mass. A biopsy of the right breast mass and enlarged lymph nodes was performed on 01/01/2013. 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 She underwent an MRI of the bilateral breasts on 01/09/2013 which showed diffuse right breast skin thickening and edema with a right axillary lymph node. A 5 mm internal mammary lymph node is incompletely imaged. The arch right breast mass measured 8 x 5 x 4.7 cm. There were abnormal areas involving all 4 quadrants however no involvement of the pectoralis minor for lower chest wall was noted. In the left breast 2 areas of nodular enhancement were noted which have been biopsied and were fibroadenomas. Jasmine Schwartz has spoken with medical oncology and surgery and is scheduled to start chemotherapy later today. She was referred for my opinion regarding radiation in the management of this disease. She is scheduled for staging studies on Tuesday of next week. She is interested in reconstruction and a referral has been made. He is accompanied by her mother today. She reports pain associated with this breast. She reports anxiety. She had a Port-A-Cath placed yesterday and that  is also sore.  PREVIOUS RADIATION THERAPY: No  PAST MEDICAL HISTORY:  has a past medical history of Anxiety; Depression; Dyslipidemia; Anxiety and depression (07/06/2011); Breast cancer (01/01/13); Diabetes mellitus; and insertion of insulin pump.    PAST SURGICAL HISTORY: Past Surgical History  Procedure Laterality Date  . Lumbar spine surgery  2009    Dr Jillyn Hidden  . Cesarean section  1989  . Tubal ligation  1991  . Breast biopsy Right 01/01/13    Invasive mammary ca,metastatic in 1/1 lymph node  . Breast biopsy Left 01/09/13    Fibroadenoma,microcalcifications    FAMILY HISTORY:  Family History  Problem Relation Age of Onset  . Cancer Neg Hx   . Diabetes Neg Hx   . Coronary artery disease Mother     SOCIAL HISTORY:  History  Substance Use Topics  . Smoking status: Never Smoker   . Smokeless tobacco: Not on file  . Alcohol Use: No    ALLERGIES: Review of patient's allergies indicates no known allergies.  MEDICATIONS:  Current Outpatient Prescriptions  Medication Sig Dispense Refill  . ALPRAZolam (XANAX) 0.25 MG tablet Take 0.25 mg by mouth 3 (three) times daily as needed for sleep or anxiety.      Marland Kitchen dexamethasone (DECADRON) 4 MG tablet Take 8 mg by mouth 2 (two) times daily with a meal. Take two times a day the day before Taxotere. Then take two times a day starting the day after chemo for 3 days.      Marland Kitchen glucose blood (NOVA MAX TEST) test strip Use as directed up  to 8 times a day      . Insulin Infusion Pump (MINIMED INSULIN PUMP) DEVI Inject 1 Units/hr into the vein continuous. Pt give extra per cards at meal times. Basic meals are about 5 units      . Insulin Infusion Pump Supplies (MINIMED INFUSION SET-MMT 397) MISC 1 Device by Does not apply route every 3 (three) days.      . Insulin Infusion Pump Supplies (PARADIGM PUMP RESERVOIR 1.76ML) MISC 1 Device by Does not apply route every 3 (three) days.      . insulin lispro (HUMALOG) 100 UNIT/ML injection Inject as directed  into Insulin pump, total of 40 units/day      . lidocaine-prilocaine (EMLA) cream Apply topically as needed. Apply to port-a-cath site 1-2 hours before treatment.  30 g  3  . LORazepam (ATIVAN) 0.5 MG tablet Take 0.5 mg by mouth every 6 (six) hours as needed (Nausea or vomiting).      . ondansetron (ZOFRAN) 8 MG tablet Take 8 mg by mouth 2 (two) times daily. Take two times a day starting the day after chemo for 3 days. Then take two times a day as needed for nausea or vomiting.      . prochlorperazine (COMPAZINE) 10 MG tablet Take 1 tablet (10 mg total) by mouth every 6 (six) hours as needed (Nausea or vomiting).  30 tablet  1  . prochlorperazine (COMPAZINE) 25 MG suppository Place 25 mg rectally every 12 (twelve) hours as needed for nausea.       No current facility-administered medications for this encounter.   Facility-Administered Medications Ordered in Other Encounters  Medication Dose Route Frequency Provider Last Rate Last Dose  . CARBOplatin (PARAPLATIN) 700 mg in sodium chloride 0.9 % 250 mL chemo infusion  700 mg Intravenous Once Victorino December, MD      . heparin lock flush 100 unit/mL  500 Units Intracatheter Once PRN Victorino December, MD      . sodium chloride 0.9 % injection 10 mL  10 mL Intracatheter PRN Victorino December, MD        REVIEW OF SYSTEMS:  A 15 point review of systems is documented in the electronic medical record. This was obtained by the nursing staff. However, I reviewed this with the patient to discuss relevant findings and make appropriate changes.  Pertinent items are noted in HPI. She denies any bone pain or headaches.   PHYSICAL EXAM:  Filed Vitals:   01/16/13 0856  BP: 113/64  Pulse: 99  Temp: 98.1 F (36.7 C)  Resp: 20  .147 lb 11.2 oz (66.996 kg). She is a pleasant female in no distress sitting comfortably examining table. She has a palpable right axillary lymph node which is mobile. She has an enlarged right breast which is bruising around the area left.  Her left breast is negative and has no changes. Her left axilla is negative she has no palpable supraclavicular or cervical adenopathy. She is alert minus x3. She has 5 out of 5 strength bilaterally.  LABORATORY DATA:  Lab Results  Component Value Date   WBC 6.8 01/15/2013   HGB 12.7 01/15/2013   HCT 37.5 01/15/2013   MCV 88.2 01/15/2013   PLT 309 01/15/2013   Lab Results  Component Value Date   NA 140 01/15/2013   K 3.7 01/15/2013   CL 105 01/15/2013   CO2 27 01/15/2013   Lab Results  Component Value Date   ALT 7 01/15/2013   AST  11 01/15/2013   ALKPHOS 43 01/15/2013   BILITOT 0.66 01/15/2013     RADIOGRAPHY: US Breast Left  01/09/2013   *RADIOLOGY REPORT*  Clinical Data:  47 year old female with newly diagnosed right breast cancer.  Left mammogram to compare to presurgical MRI.  DIGITAL DIAGNOSTIC LEFT MAMMOGRAM WITH CAD AND LEFT BREAST ULTRASOUND:  Comparison:  06/11/2012 mammogram and 01/09/2013 MRI  Findings:  ACR Breast Density Category 3: The breast tissue is heterogeneously dense.  A possible circumscribed mass in the upper outer left breast is identified. No suspicious calcifications or distortion identified.  Mammographic images were processed with CAD.  On physical exam, no palpable abnormalities identified within the upper left breast.  Ultrasound is performed, showing a 6 x 6 x 8 mm slightly irregular hypoechoic mass at the 11 o'clock position of the left breast 5 cm from the nipple An 11 x 7 x 9 mm slightly irregular hypoechoic mass at the 2 o'clock position of the left breast 4 cm from the nipple is also present. No enlarged or abnormal appearing left axillary lymph nodes are identified.  IMPRESSION: Indeterminate 6 x 6 x 8 mm hypoechoic mass at the 11 o'clock position of the left breast and 11 x 7 x 9 mm hypoechoic mass in the 2 o'clock position of the left breast.  The 11 o'clock position mass probably represents the enhancing nodule identified on today's MRI.  Tissue sampling of both masses is  recommended.  BI-RADS CATEGORY 4:  Suspicious abnormality - biopsy should be considered.  RECOMMENDATION: Ultrasound guided biopsies of both left breast masses.  The findings and recommendations were discussed with the patient and she desires to proceed with ultrasound guided left breast biopsies. These biopsies will be performed today but dictated in a separate report.  Results were also provided in writing at the conclusion of the visit.  If applicable, a reminder letter will be sent to the patient regarding the next appointment.   Original Report Authenticated By: Harmon Pier, M.D.   US Breast Right  01/01/2013   *RADIOLOGY REPORT*  Clinical Data:  A hard right breast with swelling and discoloration, enlarged right axillary mass.  DIGITAL DIAGNOSTIC RIGHT MAMMOGRAM WITH CAD AND RIGHT BREAST ULTRASOUND:  Comparison:  June 11, 2012  Findings:  ACR Breast Density Category extremely dense  CC and MLO views of the right breast are submitted.  There is diffuse skin thickening.  There is generalized increased density within the right breast.  There are enlarged right axillary lymph nodes.  Mammographic images were processed with CAD.  On physical exam, there is diffuse swelling, redness throughout the right breast. There is diffuse firmness throughout the right breast.  Ultrasound is performed, showing a large spiculated hypoechoic mass in the right breast 11 o'clock 2 cm from the nipple measuring at least 4.5 cm in size. There are abnormal lymph nodes with thickened cortex in the right axilla largest measures 3.1 cm in maximum dimension.  IMPRESSION:  Highly suspicious findings.  RECOMMENDATION: Ultrasound guided core biopsy of right breast mass and right axillary lymph node.  I have discussed the findings and recommendations with the patient. Results were also provided in writing at the conclusion of the visit.  If applicable, a reminder letter will be sent to the patient regarding the next appointment.  BI-RADS  CATEGORY 5:  Highly suggestive of malignancy - appropriate action should be taken.   Original Report Authenticated By: Sherian Rein, M.D.   Mr Breast Bilateral W Wo Contrast  01/09/2013   *  RADIOLOGY REPORT*  Clinical Data: Biopsy-proven right breast inflammatory carcinoma with primary invasive mammary carcinoma and previous biopsy and known right axillary metastatic disease.  Preoperative staging MRI.  BUN and creatinine were obtained on site at [facility] at [address] Results:  BUN 10 mg/dL,  Creatinine 0.9 mg/dL.  BILATERAL BREAST MRI WITH AND WITHOUT CONTRAST  Technique: Multiplanar, multisequence MR images of both breasts were obtained prior to and following the intravenous administration of 13ml of Multihance.  Three dimensional images were evaluated at the independent DynaCad workstation.  Comparison:  Prior mammograms and ultrasound.  The patient underwent diagnostic mammography and ultrasound of the left breast today as no recent prior exam was available after the recent diagnosis in the right breast.  Findings: There is diffuse right breast skin thickening and edema, trabecular edema, and right axillary lymphadenopathy with maximal nodal measurement 3.1 cm short-axis diameter.  There is also a 5 mm internal mammary artery chain lymph node identified image 26 series 2. There is an incompletely visualized lymph node subjacent to the pectoralis minor muscle on the first image of the T2-weighted series.  There is a large irregular enhancing mass in the right breast 11 o'clock location with associated clip artifact measuring maximally 8.0 x 5.6 by 4.7 cm. This demonstrates washin/washout type enhancement kinetics.  This corresponds to the biopsy-proven invasive mammary carcinoma.  There are abnormal nodular areas of trabecular has been throughout the right breast involving all four quadrants. No abnormal underlying pectoralis enhancement or chest wall involvement is identified.  In the left  breast 11 o'clock location, there is an 8 mm enhancing nodule corresponding to that seen at the examination earlier today. This area demonstrates plateau type enhancement kinetics.  No left- sided axillary or internal mammary artery chain lymphadenopathy is identified.  There is mild inhomogeneous nodular enhancement in the left breast two o'clock location at the site of the mass seen in this location earlier today, measuring 9 mm, measuring progressive enhancement kinetics.  IMPRESSION: Large irregular enhancing right breast mass 11 o'clock location corresponding to biopsy-proven invasive mammary carcinoma, with metastatic right axillary lymphadenopathy as well as skin and trabecular thickening/enhancement compatible with the clinical diagnosis of inflammatory and/or locally advanced breast cancer.  5 mm abnormal right internal mammary artery chain lymph node.  8 mm enhancing mass left breast 11 o'clock location and mildly irregular area of mass-like enhancement in the left breast two o'clock location, corresponding to those seen at diagnostic examination earlier today.  Biopsy has been scheduled for today and will be dictated separately.  RECOMMENDATION: Treatment plan  THREE-DIMENSIONAL MR IMAGE RENDERING ON INDEPENDENT WORKSTATION:  Three-dimensional MR images were rendered by post-processing of the original MR data on an independent workstation.  The three- dimensional MR images were interpreted, and findings were reported in the accompanying complete MRI report for this study.  BI-RADS CATEGORY 6:  Known biopsy-proven malignancy - appropriate action should be taken.   Original Report Authenticated By: Christiana Pellant, M.D.   Ir Fluoro Guide Cv Line Left  01/15/2013   *RADIOLOGY REPORT*  Clinical Data/Indication: Breast cancer  TUNNEL POWER PORT PLACEMENT WITH SUBCUTANEOUS POCKET UTILIZING ULTRASOUND & FLOUROSCOPY  Sedation: Versed 5.0 mg, Fentanyl 250 mcg.  Total Moderate Sedation Time: 35 minutes.  As  antibiotic prophylaxis, Ancef  was ordered pre-procedure and administered intravenously within one hour of incision.  Fluoroscopy Time: 36 seconds.  Procedure:  After written informed consent was obtained, patient was placed in the supine position on angiographic table.  The left neck and chest was prepped and draped in a sterile fashion. Lidocaine was utilized for local anesthesia.  The left internal jugular vein was noted to be patent initially with ultrasound. Under sonographic guidance, a micropuncture needle was inserted into the left IJ vein (Ultrasound and fluoroscopic image documentation was performed). The needle was removed over an 018 wire which was exchanged for a Amplatz.  This was advanced into the IVC.  An 8-French dilator was advanced over the Amplatz.  A small incision was made in the left upper chest over the anterior left second rib.  Utilizing blunt dissection, a subcutaneous pocket was created in the caudal direction. The pocket was irrigated with a copious amount of sterile normal saline.  The port catheter was tunneled from the chest incision, and out the neck incision.  The reservoir was inserted into the subcutaneous pocket and secured with two 3-0 Ethilon stitches.  A peel-away sheath was advanced over the Amplatz wire.  The port catheter was cut to measure length and inserted through the peel-away sheath.  The peel-away sheath was removed.  The chest incision was closed with 3-0 Vicryl interrupted stitches for the subcutaneous tissue and a running of 4- 0 Vicryl subcuticular stitch for the skin.  The neck incision was closed with a 4-0 Vicryl subcuticular stitch.  Derma-bond was applied to both surgical incisions.  The port reservoir was flushed and instilled with heparinized saline.  No complications.  Findings:  A left IJ vein Port-A-Cath is in place with its tip at the cavoatrial junction.  IMPRESSION: Successful 8 French left internal jugular vein power port placement with its tip at  the SVC/RA junction.   Original Report Authenticated By: Jolaine Click, M.D.   Ir US Guide Vasc Access Left  01/15/2013   *RADIOLOGY REPORT*  Clinical Data/Indication: Breast cancer  TUNNEL POWER PORT PLACEMENT WITH SUBCUTANEOUS POCKET UTILIZING ULTRASOUND & FLOUROSCOPY  Sedation: Versed 5.0 mg, Fentanyl 250 mcg.  Total Moderate Sedation Time: 35 minutes.  As antibiotic prophylaxis, Ancef  was ordered pre-procedure and administered intravenously within one hour of incision.  Fluoroscopy Time: 36 seconds.  Procedure:  After written informed consent was obtained, patient was placed in the supine position on angiographic table. The left neck and chest was prepped and draped in a sterile fashion. Lidocaine was utilized for local anesthesia.  The left internal jugular vein was noted to be patent initially with ultrasound. Under sonographic guidance, a micropuncture needle was inserted into the left IJ vein (Ultrasound and fluoroscopic image documentation was performed). The needle was removed over an 018 wire which was exchanged for a Amplatz.  This was advanced into the IVC.  An 8-French dilator was advanced over the Amplatz.  A small incision was made in the left upper chest over the anterior left second rib.  Utilizing blunt dissection, a subcutaneous pocket was created in the caudal direction. The pocket was irrigated with a copious amount of sterile normal saline.  The port catheter was tunneled from the chest incision, and out the neck incision.  The reservoir was inserted into the subcutaneous pocket and secured with two 3-0 Ethilon stitches.  A peel-away sheath was advanced over the Amplatz wire.  The port catheter was cut to measure length and inserted through the peel-away sheath.  The peel-away sheath was removed.  The chest incision was closed with 3-0 Vicryl interrupted stitches for the subcutaneous tissue and a running of 4- 0 Vicryl subcuticular stitch for the skin.  The neck incision  was closed with a 4-0  Vicryl subcuticular stitch.  Derma-bond was applied to both surgical incisions.  The port reservoir was flushed and instilled with heparinized saline.  No complications.  Findings:  A left IJ vein Port-A-Cath is in place with its tip at the cavoatrial junction.  IMPRESSION: Successful 8 French left internal jugular vein power port placement with its tip at the SVC/RA junction.   Original Report Authenticated By: Jolaine Click, M.D.   Mm Digital Diagnostic Unilat L  01/09/2013   *RADIOLOGY REPORT*  Clinical Data:  Evaluate clip placement following two separate ultrasound-guided left breast biopsies.  DIGITAL DIAGNOSTIC LEFT MAMMOGRAM  Comparison:  Previous exams  Findings:  Films are performed following two separate ultrasound guided biopsies of the left breast - at the 11 o'clock position and 2 o'clock position. The ribbon shaped biopsy clip corresponds to the 11 o'clock mass and the T shaped biopsy clip corresponds to the 2 o'clock mass. No immediate complications are identified.  IMPRESSION: Satisfactory clip placement following ultrasound guided left breast biopsies.  Pathology will be followed.   Original Report Authenticated By: Harmon Pier, M.D.   Mm Digital Diagnostic Unilat L  01/09/2013   *RADIOLOGY REPORT*  Clinical Data:  47 year old female with newly diagnosed right breast cancer.  Left mammogram to compare to presurgical MRI.  DIGITAL DIAGNOSTIC LEFT MAMMOGRAM WITH CAD AND LEFT BREAST ULTRASOUND:  Comparison:  06/11/2012 mammogram and 01/09/2013 MRI  Findings:  ACR Breast Density Category 3: The breast tissue is heterogeneously dense.  A possible circumscribed mass in the upper outer left breast is identified. No suspicious calcifications or distortion identified.  Mammographic images were processed with CAD.  On physical exam, no palpable abnormalities identified within the upper left breast.  Ultrasound is performed, showing a 6 x 6 x 8 mm slightly irregular hypoechoic mass at the 11 o'clock  position of the left breast 5 cm from the nipple An 11 x 7 x 9 mm slightly irregular hypoechoic mass at the 2 o'clock position of the left breast 4 cm from the nipple is also present. No enlarged or abnormal appearing left axillary lymph nodes are identified.  IMPRESSION: Indeterminate 6 x 6 x 8 mm hypoechoic mass at the 11 o'clock position of the left breast and 11 x 7 x 9 mm hypoechoic mass in the 2 o'clock position of the left breast.  The 11 o'clock position mass probably represents the enhancing nodule identified on today's MRI.  Tissue sampling of both masses is recommended.  BI-RADS CATEGORY 4:  Suspicious abnormality - biopsy should be considered.  RECOMMENDATION: Ultrasound guided biopsies of both left breast masses.  The findings and recommendations were discussed with the patient and she desires to proceed with ultrasound guided left breast biopsies. These biopsies will be performed today but dictated in a separate report.  Results were also provided in writing at the conclusion of the visit.  If applicable, a reminder letter will be sent to the patient regarding the next appointment.   Original Report Authenticated By: Harmon Pier, M.D.   Mm Digital Diagnostic Unilat R  01/01/2013   *RADIOLOGY REPORT*  Clinical Data:  Status post ultrasound guided core biopsy right breast mass  DIGITAL DIAGNOSTIC RIGHT MAMMOGRAM  Comparison:  Previous exams.  Findings:  Films are performed following right breast ultrasound guided biopsy of hypoechoic mass 11 o'clock.  CC and lateral view of the right breast demonstrates biopsy clip in the mass of concern.  IMPRESSION: Post biopsy mammogram demonstrating biopsy clip  in the area of concern.   Original Report Authenticated By: Sherian Rein, M.D.   Mm Digital Diagnostic Unilat R  01/01/2013   *RADIOLOGY REPORT*  Clinical Data:  A hard right breast with swelling and discoloration, enlarged right axillary mass.  DIGITAL DIAGNOSTIC RIGHT MAMMOGRAM WITH CAD AND RIGHT BREAST  ULTRASOUND:  Comparison:  June 11, 2012  Findings:  ACR Breast Density Category extremely dense  CC and MLO views of the right breast are submitted.  There is diffuse skin thickening.  There is generalized increased density within the right breast.  There are enlarged right axillary lymph nodes.  Mammographic images were processed with CAD.  On physical exam, there is diffuse swelling, redness throughout the right breast. There is diffuse firmness throughout the right breast.  Ultrasound is performed, showing a large spiculated hypoechoic mass in the right breast 11 o'clock 2 cm from the nipple measuring at least 4.5 cm in size. There are abnormal lymph nodes with thickened cortex in the right axilla largest measures 3.1 cm in maximum dimension.  IMPRESSION:  Highly suspicious findings.  RECOMMENDATION: Ultrasound guided core biopsy of right breast mass and right axillary lymph node.  I have discussed the findings and recommendations with the patient. Results were also provided in writing at the conclusion of the visit.  If applicable, a reminder letter will be sent to the patient regarding the next appointment.  BI-RADS CATEGORY 5:  Highly suggestive of malignancy - appropriate action should be taken.   Original Report Authenticated By: Sherian Rein, M.D.   [place] Lt Breast Bx W Loc Dev 1st Lesion Img Bx Spec US Guide  01/12/2013   *RADIOLOGY REPORT*  Clinical Data:  47 year old female with newly diagnosed right breast cancer.  Two left breast masses identified sonographically - 6 x 8 mm at the 11 o'clock position and 9 x 11 mm at the 2 o'clock position. For tissue sampling of the 6 x 8 mm mass at the 11 o'clock position.  ULTRASOUND GUIDED VACUUM ASSISTED CORE BIOPSY OF THE LEFT BREAST  Comparison: Previous exams.  I met with the patient and we discussed the procedure of ultrasound- guided biopsy, including benefits and alternatives.  We discussed the high likelihood of a successful procedure. We discussed the  risks of the procedure including infection, bleeding, tissue injury, clip migration, and inadequate sampling.  Informed written consent was given.  Using sterile technique and 2% Lidocaine as local anesthetic, under direct ultrasound visualization, a12 gauge vacuum-assisteddevice was used to perform biopsy of the 6 x 8 mm hypoechoic mass at the 11 o'clock position of the left breast 5 cm from the nipple using a medial approach. At the conclusion of the procedure, a ribbon shaped tissue marker clip was deployed into the biopsy cavity. Follow-up 2-view mammogram was performed and dictated separately.  The usual time-out protocol was performed immediately prior to the procedure.  IMPRESSION: Ultrasound-guided biopsy of 6 x 8 mm hypoechoic mass at the 11 o'clock position of the left breast.  No apparent complications.  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  Histology correlates with imaging findings.  The patient was contacted by phone on 01/12/2013 and these results given to her which she understood. Her questions were answered. The patient had no complaints with her biopsy site.  Recommend treatment plan.   Original Report Authenticated By: Harmon Pier, M.D.   [place] Lt Breast Bx W Loc Dev Ea Add Lesion Img Bx Spec US Guide  01/12/2013   *RADIOLOGY REPORT*  Clinical Data:  47 year old female with newly diagnosed right breast cancer.  Two left breast masses identified sonographically - 6 x 8 mm at the 11 o'clock position and 9 x 11 mm at the 2 o'clock position. For tissue sampling of the 9 x 11 mm mass at the 2 o'clock position.  ULTRASOUND GUIDED VACUUM ASSISTED CORE BIOPSY OF THE LEFT BREAST  Comparison: Previous exams.  I met with the patient and we discussed the procedure of ultrasound- guided biopsy, including benefits and alternatives.  We discussed the high likelihood of a successful procedure. We discussed the risks of the procedure including  infection, bleeding, tissue injury, clip migration, and inadequate sampling.  Informed written consent was given.  Using sterile technique and 2% Lidocaine as local anesthetic, under direct ultrasound visualization, a 12 gauge vacuum-assisteddevice was used to perform biopsy of the 9 x 11 mm hypoechoic mass at the 2 o'clock position of the left breast 4 cm from the nipple using a medial approach. At the conclusion of the procedure, a  tissue marker clip was deployed into the biopsy cavity.  Follow-up 2-view mammogram was performed and dictated separately.  The usual time-out protocol was performed immediately prior to the procedure.  IMPRESSION: Ultrasound-guided biopsy of 9 x 11 mm mass in the 2 o'clock position of the left breast.  No apparent complications.  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 Histology correlates with imaging findings.  The patient was contacted by phone on 01/12/2013 and these results given to her which she understood. Her questions were answered. The patient had no complaints with her biopsy site.  Recommend treatment plan.   Original Report Authenticated By: Harmon Pier, M.D.   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  These are found to be concordant with imaging findings.  I discussed the results over the phone with the patient and answer her questions.  The patient states she is doing well post biopsy with no complications but mild tenderness at biopsy site. Recommend MRI of the breasts and surgical consultation. The patient has appointment for MR the breast on Jan 09, 2013 and appointment for Dr. Derrell Lolling on Jan 09, 2013  **END ADDENDUM** SIGNED BY: Sherian Rein, M.D.  01/01/2013   *RADIOLOGY REPORT*  Clinical Data:   Right breast and right axillary masses  ULTRASOUND GUIDED CORE BIOPSY OF THE right BREAST  Comparison: Previous exams.  I met with the patient and we discussed the procedure of ultrasound- guided biopsy, including benefits and alternatives.  We discussed the high likelihood of a successful procedure. We discussed the risks of the procedure, including infection, bleeding, tissue injury, clip migration, and inadequate sampling.  Informed written consent was given.  Using sterile technique  lidocaine, ultrasound guidance and a 14 gauge automated biopsy device, biopsy was performed of hypoechoic mass at the right breast 11 o'clock using a lateral approach.  At the conclusion of the procedure a ribbon tissue marker clip was deployed into the biopsy cavity.  Follow up 2 view mammogram was performed and dictated separately.  Using sterile technique and lidocaine, ultrasound guidance and a 14 gauge automated biopsy device, biopsy was performed of abnormal enlarged right axillary lymph nodes. using a lateral. approach.  At the conclusion of the procedure no tissue marker clip was deployed into the biopsy cavity.  IMPRESSION: Ultrasound guided biopsy of right breast and right axilla.  No apparent complications.  Original Report Authenticated By: Sherian Rein, M.D.   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  These are found to be concordant with imaging findings.  I discussed the results over the phone with the patient and answer her questions.  The patient states she is doing well post biopsy with no complications but mild tenderness at biopsy site. Recommend MRI of the breasts and surgical consultation. The patient has appointment for MR the breast on Jan 09, 2013 and appointment for Dr. Derrell Lolling on Jan 09, 2013   **END ADDENDUM** SIGNED BY: Sherian Rein, M.D.  01/01/2013   *RADIOLOGY REPORT*  Clinical Data:  Right breast and right axillary masses  ULTRASOUND GUIDED CORE BIOPSY OF THE right BREAST  Comparison: Previous exams.  I met with the patient and we discussed the procedure of ultrasound- guided biopsy, including benefits and alternatives.  We discussed the high likelihood of a successful procedure. We discussed the risks of the procedure, including infection, bleeding, tissue injury, clip migration, and inadequate sampling.  Informed written consent was given.  Using sterile technique  lidocaine, ultrasound guidance and a 14 gauge automated biopsy device, biopsy was performed of hypoechoic mass at the right breast 11 o'clock using a lateral approach.  At the conclusion of the procedure a ribbon tissue marker clip was deployed into the biopsy cavity.  Follow up 2 view mammogram was performed and dictated separately.  Using sterile technique and lidocaine, ultrasound guidance and a 14 gauge automated biopsy device, biopsy was performed of abnormal enlarged right axillary lymph nodes. using a lateral. approach.  At the conclusion of the procedure no tissue marker clip was deployed into the biopsy cavity.  IMPRESSION: Ultrasound guided biopsy of right breast and right axilla.  No apparent complications.  Original Report Authenticated By: Sherian Rein, M.D.      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 As her tumor doesn't involve all 4 quadrants I think is likely she will plan to put mastectomy. Given her positive lymph node she is a candidate for postmastectomy radiation. We discussed the process of simulation the placement tattoos. We discussed 6 weeks of treatment as an outpatient. We discussed the possible side effects of treatment including but not limited to skin redness and fatigue. We discussed  damage to ribs and lungs. We discussed that she could receive her treatment and aspirate if she chose. She and her husband are in sure about this at this point. I will meet back with her after her surgery and we can discuss further. I told her radiation would start about 4-6 weeks after treatment. It is possible that the clinical trials looking at radiation to the axilla in patients who have a neoadjuvant complete response versus axillary node dissection can be opened at this time and she would be a candidate for those trials.  I spent 40 minutes  face to face with the patient and more than 50% of that time was spent in counseling and/or coordination of care.   ------------------------------------------------  Thea Silversmith, MD

## 2013-01-17 ENCOUNTER — Ambulatory Visit (HOSPITAL_BASED_OUTPATIENT_CLINIC_OR_DEPARTMENT_OTHER): Payer: BC Managed Care – PPO

## 2013-01-17 VITALS — BP 134/66 | HR 88 | Temp 97.4°F

## 2013-01-17 DIAGNOSIS — C50911 Malignant neoplasm of unspecified site of right female breast: Secondary | ICD-10-CM

## 2013-01-17 DIAGNOSIS — C773 Secondary and unspecified malignant neoplasm of axilla and upper limb lymph nodes: Secondary | ICD-10-CM

## 2013-01-17 DIAGNOSIS — Z5189 Encounter for other specified aftercare: Secondary | ICD-10-CM

## 2013-01-17 DIAGNOSIS — C50419 Malignant neoplasm of upper-outer quadrant of unspecified female breast: Secondary | ICD-10-CM

## 2013-01-17 MED ORDER — PEGFILGRASTIM INJECTION 6 MG/0.6ML
6.0000 mg | Freq: Once | SUBCUTANEOUS | Status: AC
  Administered 2013-01-17: 6 mg via SUBCUTANEOUS

## 2013-01-17 NOTE — Patient Instructions (Addendum)

## 2013-01-17 NOTE — Addendum Note (Signed)
Addended by: Gala Romney on: 01/17/2013 09:48 AM   Modules accepted: Orders

## 2013-01-19 ENCOUNTER — Telehealth: Payer: Self-pay | Admitting: *Deleted

## 2013-01-19 ENCOUNTER — Telehealth: Payer: Self-pay

## 2013-01-19 NOTE — Telephone Encounter (Signed)
increase basal rate to 1.5 units/hr, 24 hrs per day.  continue mealtime bolus of 1 unit/ 10 grams carbohydrate  continue correction bolus (which some people call "sensitivity," or "insulin sensitivity ratio," or just "isr") of 1 unit for each 50 by which your glucose exceeds 100).  Please call if cbg's don't improve

## 2013-01-19 NOTE — Telephone Encounter (Signed)
Pt advised and states an understanding 

## 2013-01-19 NOTE — Telephone Encounter (Signed)
Pt states she started chemoon Friday and her cbg has been 300's-340, pt states she dropped her bolus rate to 1.25 and is doing her bolus corections every 3-4 hours and cbg is still 300's-340, please advise [phone]

## 2013-01-19 NOTE — Telephone Encounter (Signed)
Reports constipation, but has MiraLax and softener she can take for this. No nausea or bone pain. Blood sugars are high due to steroids-has call in to her PCP. Made her aware to let PCP know she has PET scan scheduled for tomorrow, because she will need good blood sugar control for this to be done.

## 2013-01-20 ENCOUNTER — Encounter (HOSPITAL_COMMUNITY)
Admission: RE | Admit: 2013-01-20 | Discharge: 2013-01-20 | Disposition: A | Discharge status: Home / Self Care | Payer: BC Managed Care – PPO | Source: Ambulatory Visit | Attending: Oncology | Admitting: Oncology

## 2013-01-20 ENCOUNTER — Ambulatory Visit (HOSPITAL_COMMUNITY)
Admission: RE | Admit: 2013-01-20 | Discharge: 2013-01-20 | Disposition: A | Discharge status: Home / Self Care | Payer: BC Managed Care – PPO | Source: Ambulatory Visit | Attending: Oncology | Admitting: Oncology

## 2013-01-20 ENCOUNTER — Encounter (HOSPITAL_COMMUNITY): Payer: Self-pay

## 2013-01-20 DIAGNOSIS — R599 Enlarged lymph nodes, unspecified: Secondary | ICD-10-CM | POA: Insufficient documentation

## 2013-01-20 DIAGNOSIS — Z9641 Presence of insulin pump (external) (internal): Secondary | ICD-10-CM | POA: Insufficient documentation

## 2013-01-20 DIAGNOSIS — R234 Changes in skin texture: Secondary | ICD-10-CM | POA: Insufficient documentation

## 2013-01-20 DIAGNOSIS — Z79899 Other long term (current) drug therapy: Secondary | ICD-10-CM | POA: Insufficient documentation

## 2013-01-20 DIAGNOSIS — N831 Corpus luteum cyst of ovary, unspecified side: Secondary | ICD-10-CM | POA: Insufficient documentation

## 2013-01-20 DIAGNOSIS — C50911 Malignant neoplasm of unspecified site of right female breast: Secondary | ICD-10-CM

## 2013-01-20 DIAGNOSIS — C50919 Malignant neoplasm of unspecified site of unspecified female breast: Secondary | ICD-10-CM | POA: Insufficient documentation

## 2013-01-20 LAB — GLUCOSE, CAPILLARY: Glucose-Capillary: 263 mg/dL — ABNORMAL HIGH (ref 70–99)

## 2013-01-20 MED ORDER — FLUDEOXYGLUCOSE F - 18 (FDG) INJECTION
19.1000 | Freq: Once | INTRAVENOUS | Status: AC | PRN
  Administered 2013-01-20: 19.1 via INTRAVENOUS

## 2013-01-20 MED ORDER — IOHEXOL 300 MG/ML  SOLN
100.0000 mL | Freq: Once | INTRAMUSCULAR | Status: AC | PRN
  Administered 2013-01-20: 100 mL via INTRAVENOUS

## 2013-01-21 ENCOUNTER — Other Ambulatory Visit: Payer: Self-pay | Admitting: *Deleted

## 2013-01-21 ENCOUNTER — Other Ambulatory Visit: Payer: Self-pay | Admitting: Emergency Medicine

## 2013-01-21 DIAGNOSIS — K1230 Oral mucositis (ulcerative), unspecified: Secondary | ICD-10-CM

## 2013-01-21 DIAGNOSIS — C50919 Malignant neoplasm of unspecified site of unspecified female breast: Secondary | ICD-10-CM

## 2013-01-21 DIAGNOSIS — K121 Other forms of stomatitis: Secondary | ICD-10-CM

## 2013-01-21 MED ORDER — MAGIC MOUTHWASH W/LIDOCAINE: 5.0000 mL | Freq: Four times a day (QID) | ORAL | Status: DC | PRN

## 2013-01-21 NOTE — Telephone Encounter (Signed)
Patient calling in to report that she feels very tired and weak for the past couple days. C1D1 with Taxotere/Carboplatin was on 01/16/13, with D2 Neulasta. Also pre and post treatment with oral steroids.  Denies any fever/chills/diarrhea/nausea/vomiting. Does state that her mouth has become very tender and is starting to become very difficult to eat. Patient is still urinating several times daily and having routine bowel movements. States she is hydrating well.  Reviewed with Augustin Schooling, NP, we will call in for Magic Mouthwash with Lidocaine. Patient instructed to call if any worsening of symptoms or fever noted, or if sore/tender mouth changes to open lesions. Otherwise, we will see patient with labs and MD appt on 6/13 as scheduled. Patient verbalized understanding.

## 2013-01-21 NOTE — Telephone Encounter (Signed)
Rec'd call from spouse, [facility] reported not getting earlier prescription.

## 2013-01-23 ENCOUNTER — Ambulatory Visit (HOSPITAL_BASED_OUTPATIENT_CLINIC_OR_DEPARTMENT_OTHER): Payer: BC Managed Care – PPO | Admitting: Oncology

## 2013-01-23 ENCOUNTER — Other Ambulatory Visit (HOSPITAL_BASED_OUTPATIENT_CLINIC_OR_DEPARTMENT_OTHER): Payer: BC Managed Care – PPO | Admitting: Lab

## 2013-01-23 ENCOUNTER — Encounter: Payer: Self-pay | Admitting: Oncology

## 2013-01-23 VITALS — BP 119/78 | HR 101 | Temp 98.3°F | Resp 20 | Ht 64.0 in | Wt 139.5 lb

## 2013-01-23 DIAGNOSIS — C50911 Malignant neoplasm of unspecified site of right female breast: Secondary | ICD-10-CM

## 2013-01-23 DIAGNOSIS — K1370 Unspecified lesions of oral mucosa: Secondary | ICD-10-CM

## 2013-01-23 DIAGNOSIS — C50919 Malignant neoplasm of unspecified site of unspecified female breast: Secondary | ICD-10-CM

## 2013-01-23 DIAGNOSIS — B3731 Acute candidiasis of vulva and vagina: Secondary | ICD-10-CM

## 2013-01-23 DIAGNOSIS — B373 Candidiasis of vulva and vagina: Secondary | ICD-10-CM

## 2013-01-23 LAB — CBC WITH DIFFERENTIAL/PLATELET
BASO%: 0.2 % (ref 0.0–2.0)
EOS%: 0.1 % (ref 0.0–7.0)
HCT: 40.3 % (ref 34.8–46.6)
MCH: 30.1 pg (ref 25.1–34.0)
MCHC: 34.7 g/dL (ref 31.5–36.0)
MONO#: 1.4 10*3/uL — ABNORMAL HIGH (ref 0.1–0.9)
NEUT%: 70.6 % (ref 38.4–76.8)
RBC: 4.64 10*6/uL (ref 3.70–5.45)
WBC: 13.8 10*3/uL — ABNORMAL HIGH (ref 3.9–10.3)
lymph#: 2.6 10*3/uL (ref 0.9–3.3)

## 2013-01-23 LAB — COMPREHENSIVE METABOLIC PANEL (CC13)
ALT: 12 U/L (ref 0–55)
AST: 13 U/L (ref 5–34)
Calcium: 8.7 mg/dL (ref 8.4–10.4)
Chloride: 99 mEq/L (ref 98–107)
Creatinine: 0.8 mg/dL (ref 0.6–1.1)
Sodium: 134 mEq/L — ABNORMAL LOW (ref 136–145)
Total Bilirubin: 0.78 mg/dL (ref 0.20–1.20)
Total Protein: 6.5 g/dL (ref 6.4–8.3)

## 2013-01-23 MED ORDER — VALACYCLOVIR HCL 500 MG PO TABS: 500.0000 mg | ORAL_TABLET | Freq: Two times a day (BID) | ORAL | Status: DC

## 2013-01-23 MED ORDER — FLUCONAZOLE 200 MG PO TABS: 200.0000 mg | ORAL_TABLET | Freq: Every day | ORAL | Status: DC

## 2013-01-23 NOTE — Patient Instructions (Addendum)
Doing well, tolerated chemotherapy as expected  For yeast infection begin diflucan 200 mg daily for 5 days  Valtrex 500 mg twice a day for 5 days then 1 a day for the mouth sores  Drink plenty of water to keep well hydrated  We will see you back on 6/27 she also

## 2013-01-29 ENCOUNTER — Encounter (HOSPITAL_COMMUNITY): Payer: Self-pay

## 2013-01-29 ENCOUNTER — Ambulatory Visit (HOSPITAL_COMMUNITY)
Admission: RE | Admit: 2013-01-29 | Discharge: 2013-01-29 | Disposition: A | Discharge status: Home / Self Care | Payer: BC Managed Care – PPO | Source: Ambulatory Visit | Attending: Internal Medicine | Admitting: Internal Medicine

## 2013-01-29 VITALS — BP 100/60 | HR 73 | Wt 141.0 lb

## 2013-01-29 DIAGNOSIS — C50911 Malignant neoplasm of unspecified site of right female breast: Secondary | ICD-10-CM

## 2013-01-29 DIAGNOSIS — Z9641 Presence of insulin pump (external) (internal): Secondary | ICD-10-CM | POA: Insufficient documentation

## 2013-01-29 DIAGNOSIS — E119 Type 2 diabetes mellitus without complications: Secondary | ICD-10-CM | POA: Insufficient documentation

## 2013-01-29 DIAGNOSIS — F329 Major depressive disorder, single episode, unspecified: Secondary | ICD-10-CM | POA: Insufficient documentation

## 2013-01-29 DIAGNOSIS — F411 Generalized anxiety disorder: Secondary | ICD-10-CM | POA: Insufficient documentation

## 2013-01-29 DIAGNOSIS — Z794 Long term (current) use of insulin: Secondary | ICD-10-CM | POA: Insufficient documentation

## 2013-01-29 DIAGNOSIS — F3289 Other specified depressive episodes: Secondary | ICD-10-CM | POA: Insufficient documentation

## 2013-01-29 DIAGNOSIS — C50919 Malignant neoplasm of unspecified site of unspecified female breast: Secondary | ICD-10-CM | POA: Insufficient documentation

## 2013-01-29 DIAGNOSIS — Z79899 Other long term (current) drug therapy: Secondary | ICD-10-CM | POA: Insufficient documentation

## 2013-01-29 DIAGNOSIS — E785 Hyperlipidemia, unspecified: Secondary | ICD-10-CM | POA: Insufficient documentation

## 2013-01-29 DIAGNOSIS — C779 Secondary and unspecified malignant neoplasm of lymph node, unspecified: Secondary | ICD-10-CM | POA: Insufficient documentation

## 2013-01-29 NOTE — Assessment & Plan Note (Addendum)
Explained the purpose of the cardio-onc clinic.Discussed that there is ~ 10% chance of cardiotoxicity from Herceptin. Dr Gala Romney discussed and reviewed ECHO from earlier this month. Follow up in 3 months with an ECHO.   Patient seen and examined with Tonye Becket, NP. We discussed all aspects of the encounter. I agree with the assessment and plan as stated above.  Explained incidence of Herceptin cardiotoxicity and role of Cardio-oncology clinic at length. Echo images reviewed personally. All parameters stable. Reviewed signs and symptoms of HF to look for. Continue Herceptin. Follow-up with echo in 3 months.

## 2013-01-29 NOTE — Patient Instructions (Addendum)
Follow up in 3 months with an ECHO 

## 2013-01-29 NOTE — Progress Notes (Signed)
Patient ID: Jasmine Schwartz, female   DOB: May 19, 1966, 47 y.o.   MRN: [id number] Oncologist: Dr Welton Flakes General Surgeon: Dr Derrell Lolling  HPI: She is referred to the cardio-onc clinic by Dr Welton Flakes  Jasmine Schwartz is a 47 year old with PMH of DM I, R breast cancer ER/PR negative HER-2 positive invasive ductal carcinoma the Ki-67 was 76% with no known coronary disease. She is referred by Dr. Welton Flakes for enrollment into the cardio-oncology clinic.  Denies any known cardiac history. Denies SOB/PND/Orthopnea/edema.. Complains of fatigue. Works full time as Orthoptist.    Plan for 6 cycles of chemotherapy, followed by radiation and then bilateral mastectomies. Continuing on Herceptin for 1 year.   ECHO - reviewed personally in clinc 01/13/13 EF 60% Lateral S' 11.2   Review of Systems:     Cardiac Review of Systems: {Y] = yes [ ]  = no  Chest Pain [    ]  Resting SOB [   ] Exertional SOB  [  ]  Orthopnea [  ]   Pedal Edema [   ]    Palpitations [  ] Syncope  [  ]   Presyncope [   ]  General Review of Systems: [Y] = yes [  ]=no Constitional: recent weight change [  ]; anorexia [  ]; fatigue [  ]; nausea [  ]; night sweats [  ]; fever [  ]; or chills [  ];                                                                                                                                          Dental: poor dentition[  ]; Last Dentist visit:   Eye : blurred vision [  ]; diplopia [   ]; vision changes [  ];  Amaurosis fugax[  ]; Resp: cough [  ];  wheezing[  ];  hemoptysis[  ]; shortness of breath[  ]; paroxysmal nocturnal dyspnea[  ]; dyspnea on exertion[  ]; or orthopnea[  ];  GI:  gallstones[  ], vomiting[  ];  dysphagia[  ]; melena[  ];  hematochezia [  ]; heartburn[  ];   Hx of  Colonoscopy[  ]; GU: kidney stones [  ]; hematuria[  ];   dysuria [  ];  nocturia[  ];  history of     obstruction [  ];                 Skin: rash, swelling[  ];, hair loss[  ];  peripheral edema[  ];  or itching[   ]; Musculosketetal: myalgias[  ];  joint swelling[  ];  joint erythema[  ];  joint pain[  ];  back pain[  ];  Heme/Lymph: bruising[  ];  bleeding[  ];  anemia[  ];  Neuro: TIA[  ];  headaches[  ];  stroke[  ];  vertigo[  ];  seizures[  ];   paresthesias[  ];  difficulty walking[  ];  Psych:depression[  ]; anxiety[Y  ];  Endocrine: diabetes[ Y ];  thyroid dysfunction[  ];  Immunizations: Flu [  ]; Pneumococcal[  ];  Other:    Past Medical History  Diagnosis Date  . Anxiety   . Depression   . Dyslipidemia   . Anxiety and depression 07/06/2011  . Breast cancer 01/01/13    /metastatic 1/1 lymph nodes  . Diabetes mellitus     IDDM  . Hx of insertion of insulin pump     Current Outpatient Prescriptions  Medication Sig Dispense Refill  . ALPRAZolam (XANAX) 0.25 MG tablet Take 0.25 mg by mouth 3 (three) times daily as needed for sleep or anxiety.      . Alum & Mag Hydroxide-Simeth (MAGIC MOUTHWASH W/LIDOCAINE) SOLN Take 5 mLs by mouth 4 (four) times daily as needed. Swish and spit.  120 mL  1  . dexamethasone (DECADRON) 4 MG tablet Take 8 mg by mouth 2 (two) times daily with a meal. Take two times a day the day before Taxotere. Then take two times a day starting the day after chemo for 3 days.      . fluconazole (DIFLUCAN) 200 MG tablet Take 1 tablet (200 mg total) by mouth daily.  5 tablet  5  . glucose blood (NOVA MAX TEST) test strip Use as directed up to 8 times a day      . Insulin Infusion Pump (MINIMED INSULIN PUMP) DEVI Inject 1 Units/hr into the vein continuous. Pt give extra per cards at meal times. Basic meals are about 5 units      . Insulin Infusion Pump Supplies (MINIMED INFUSION SET-MMT 397) MISC 1 Device by Does not apply route every 3 (three) days.      . Insulin Infusion Pump Supplies (PARADIGM PUMP RESERVOIR 1.76ML) MISC 1 Device by Does not apply route every 3 (three) days.      . insulin lispro (HUMALOG) 100 UNIT/ML injection Inject as directed into Insulin pump, total  of 40 units/day      . lidocaine-prilocaine (EMLA) cream Apply topically as needed. Apply to port-a-cath site 1-2 hours before treatment.  30 g  3  . LORazepam (ATIVAN) 0.5 MG tablet Take 0.5 mg by mouth every 6 (six) hours as needed (Nausea or vomiting).      . ondansetron (ZOFRAN) 8 MG tablet Take 8 mg by mouth 2 (two) times daily. Take two times a day starting the day after chemo for 3 days. Then take two times a day as needed for nausea or vomiting.      . prochlorperazine (COMPAZINE) 10 MG tablet Take 1 tablet (10 mg total) by mouth every 6 (six) hours as needed (Nausea or vomiting).  30 tablet  1  . prochlorperazine (COMPAZINE) 25 MG suppository Place 25 mg rectally every 12 (twelve) hours as needed for nausea.      . valACYclovir (VALTREX) 500 MG tablet Take 1 tablet (500 mg total) by mouth 2 (two) times daily.  30 tablet  4   No current facility-administered medications for this encounter.     No Known Allergies  History   Social History  . Marital Status: Married    Spouse Name: N/A    Number of Children: 2  . Years of Education: N/A   Occupational History  .     Social History Main Topics  . Smoking status: Never Smoker   .  Smokeless tobacco: Not on file  . Alcohol Use: No  . Drug Use: No  . Sexually Active: Yes   Other Topics Concern  . Not on file   Social History Narrative  . No narrative on file    Family History  Problem Relation Age of Onset  . Cancer Neg Hx   . Diabetes Neg Hx   . Coronary artery disease Mother     PHYSICAL EXAM: Filed Vitals:   01/29/13 1140  BP: 100/60  Pulse: 73   General:  Well appearing. No respiratory difficulty HEENT: normal Neck: supple. no JVD. Carotids 2+ bilat; no bruits. No lymphadenopathy or thryomegaly appreciated. Cor: PMI nondisplaced. Regular rate & rhythm. No rubs, gallops or murmurs. Lungs: clear Abdomen: soft, nontender, nondistended. No hepatosplenomegaly. No bruits or masses. Good bowel  sounds. Extremities: no cyanosis, clubbing, rash, edema Neuro: alert & oriented x 3, cranial nerves grossly intact. moves all 4 extremities w/o difficulty. Affect pleasant.    No results found for this or any previous visit (from the past 24 hour(s)). No results found.   ASSESSMENT & PLAN:

## 2013-02-02 NOTE — Progress Notes (Signed)
[name] [id number] [birth year] 46 y.o. 02/02/2013 11:31 PM  CC  [name], MD [address] [place] [address] Dr. Lurline Hare Dr. Claud Kelp  REASON FOR CONSULTATION:  47 year old female with new diagnosis of right breast cancer by MRI measuring 8 cm. 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 Claud Kelp  HISTORY OF PRESENT ILLNESS:  Jasmine Schwartz is a 47 y.o. female.  Would medical history significant for insulin-dependent diabetes.patient palpated a right breast mass in October 2013. At that time she had mammogram performed and it was negative. However her breast continue to increase in size. Initially patient felt that it could have been cysts or something on and therefore she did not go to a physician. However subsequently patient's breast became quite painful and she also began to feel in knot under her right arm.because of this she went to her physician who did a physical exam and a mammogram was performed on 01/01/2013 the mammogram showed diffuse skin thickening generalized increased density within the right breast and enlarged right axillary lymph nodes. She went on to have an ultrasound of the right breast performed that showed a large spiculated hypoechoic mass in the right breast 11:00 2 cm from the nipple measuring at least 4.5 cm in size. There were abnormal lymph nodes with thickened cortex and the right axilla the largest one measuring 3.1 cm in dimension. Patient was recommended ultrasound guided core biopsy of the right breast mass and the right axilla lymph node. The biopsy was performed on 01/01/2013. The right needle core biopsy of the primary mass showed invasive mammary carcinoma grade 2-3 was invasive ductal. The lymph node of the right axilla was positive for  metastatic disease. The tumor was ER PR negative HER-2/neu positive with a Ki-67 of 76%. On 01/09/2013 patient had MRI of the breasts performed that showed diffuse right breast skin thickening and edema with right axillary lymph node. There was a 5 mm internal mammary lymph node that was incompletely imaged. The right breast mass measured 8 x 5 x 4.7 cm. There were abnormal areas involving all 4 quadrants. There was however no involvement of the pectoralis minor for lower chest wall. The left breast showed 2 areas of nodular enhancement these were biopsied and were fibroadenomas. She has been seen by Dr. Claud Kelp as well as Dr. Lurline Hare. Dr. Derrell Lolling did discuss preop chemotherapy to try to reduce the size of the tumor however she will still require a mastectomy.  Patient is without any complaints.   Past Medical History: Past Medical History  Diagnosis Date  . Anxiety   . Depression   . Dyslipidemia   . Anxiety and depression 07/06/2011  . Breast cancer 01/01/13    /metastatic 1/1 lymph nodes  . Diabetes mellitus     IDDM  . Hx of insertion of insulin pump     Past Surgical History: Past Surgical History  Procedure Laterality Date  . Lumbar spine surgery  2009    Dr Jillyn Hidden  . Cesarean section  1989  . Tubal ligation  1991  . Breast biopsy Right 01/01/13    Invasive mammary ca,metastatic in 1/1 lymph node  . Breast biopsy Left 01/09/13    Fibroadenoma,microcalcifications    Family History: Family History  Problem Relation Age of Onset  .  Cancer Neg Hx   . Diabetes Neg Hx   . Coronary artery disease Mother     Social History History  Substance Use Topics  . Smoking status: Never Smoker   . Smokeless tobacco: Not on file  . Alcohol Use: No    Allergies: No Known Allergies  Current Medications: Current Outpatient Prescriptions  Medication Sig Dispense Refill  . glucose blood (NOVA MAX TEST) test strip Use as directed up to 8 times a day      . Insulin Infusion  Pump (MINIMED INSULIN PUMP) DEVI Inject 1 Units/hr into the vein continuous. Pt give extra per cards at meal times. Basic meals are about 5 units      . Insulin Infusion Pump Supplies (MINIMED INFUSION SET-MMT 397) MISC 1 Device by Does not apply route every 3 (three) days.      . Insulin Infusion Pump Supplies (PARADIGM PUMP RESERVOIR 1.76ML) MISC 1 Device by Does not apply route every 3 (three) days.      . ALPRAZolam (XANAX) 0.25 MG tablet Take 0.25 mg by mouth 3 (three) times daily as needed for sleep or anxiety.      . Alum & Mag Hydroxide-Simeth (MAGIC MOUTHWASH W/LIDOCAINE) SOLN Take 5 mLs by mouth 4 (four) times daily as needed. Swish and spit.  120 mL  1  . dexamethasone (DECADRON) 4 MG tablet Take 8 mg by mouth 2 (two) times daily with a meal. Take two times a day the day before Taxotere. Then take two times a day starting the day after chemo for 3 days.      . fluconazole (DIFLUCAN) 200 MG tablet Take 1 tablet (200 mg total) by mouth daily.  5 tablet  5  . insulin lispro (HUMALOG) 100 UNIT/ML injection Inject as directed into Insulin pump, total of 40 units/day      . lidocaine-prilocaine (EMLA) cream Apply topically as needed. Apply to port-a-cath site 1-2 hours before treatment.  30 g  3  . LORazepam (ATIVAN) 0.5 MG tablet Take 0.5 mg by mouth every 6 (six) hours as needed (Nausea or vomiting).      . ondansetron (ZOFRAN) 8 MG tablet Take 8 mg by mouth 2 (two) times daily. Take two times a day starting the day after chemo for 3 days. Then take two times a day as needed for nausea or vomiting.      . prochlorperazine (COMPAZINE) 10 MG tablet Take 1 tablet (10 mg total) by mouth every 6 (six) hours as needed (Nausea or vomiting).  30 tablet  1  . prochlorperazine (COMPAZINE) 25 MG suppository Place 25 mg rectally every 12 (twelve) hours as needed for nausea.      . valACYclovir (VALTREX) 500 MG tablet Take 1 tablet (500 mg total) by mouth 2 (two) times daily.  30 tablet  4   No current  facility-administered medications for this visit.    OB/GYN History:and she is premenopausal walls menstrual period was on 01/04/2013. Patient has had 1 twin pregnancy at the age of 63.  Fertility Discussion: no Prior History of Cancer: no  Health Maintenance:  Colonoscopy n0  Bone Densityno Last PAP smear yes  ECOG PERFORMANCE STATUS: 0 - Asymptomatic  Genetic Counseling/testing: yes  REVIEW OF SYSTEMS: Comprehensive 14 point review of systems is obtained and is scant separately into the electronic medical record    PHYSICAL EXAMINATION: Blood pressure 112/69, pulse 94, temperature 98.6 F (37 C), temperature source Oral, resp. rate 20, height 5\' 4"  (1.626 m),  weight 146 lb (66.225 kg). Well-developed well-nourished female in no acute distress HEENT exam EOMI PERRLA sclerae anicteric no conjunctival pallor oral mucosa is moist neck is supple lungs are clear bilaterally to auscultation cardiovascular regular rate rhythm abdomen soft nontender nondistended bowel sounds are present no HSM extremities no edema neuro is nonfocal Breast examination: Left breast no masses or nipple discharge Right breast is diffusely enlarged there is palpable mass in all 4 quadrants no nipple discharge      STUDIES/RESULTS: Ct Chest W Contrast  01/20/2013   *RADIOLOGY REPORT*  Clinical Data:  High risk breast cancer.  Evaluate for metastatic disease.  CT CHEST, ABDOMEN AND PELVIS WITH CONTRAST  Technique:  Multidetector CT imaging of the chest, abdomen and pelvis was performed following the standard protocol during bolus administration of intravenous contrast.  Contrast: OMNIPAQUE IOHEXOL 300 MG/ML  SOLN  Comparison:  None  CT CHEST  Findings:  Lungs/pleura: There is no pleural effusion.  No suspicious pulmonary nodule or mass noted.  Heart/Mediastinum: Heart size is normal.  No pericardial effusion. No mediastinal or hilar adenopathy.  Bones/Musculoskeletal:  Asymmetric increased soft tissue  within the right breast parenchyma corresponding to patient's biopsy-proven invasive breast carcinoma.  There is associated overlying skin thickening.  Multiple enlarged right axillary lymph nodes are identified.  Index lymph node measures 1.7 cm, image 23/series 2. Several enhancing, sub centimeter retropectoral lymph nodes are identified.  IMPRESSION:  1.  No acute findings. 2.  Right breast invasive carcinoma with associated right axillary adenopathy.  CT ABDOMEN AND PELVIS  Findings:  There are no focal liver abnormalities identified.  The gallbladder appears normal.  There is no biliary dilatation.  The pancreas is unremarkable.  Normal appearance of the spleen.  The adrenal glands are both normal.  The kidneys are unremarkable. Urinary bladder appears normal.  The uterus is unremarkable. Corpus luteal cyst is noted in the left ovary measuring 1.6 cm.  No free fluid or fluid collections identified within the upper abdomen or pelvis.  The abdominal aorta has a normal caliber. There is no adenopathy identified within the upper abdomen.  No pelvic or inguinal adenopathy noted.  The stomach is normal.  The small bowel loops are unremarkable. Normal appearance of the colon.  Review of the visualized osseous structures is negative for aggressive lytic or sclerotic bone lesion.  IMPRESSION:  1.  No acute findings within the abdomen or pelvis. 2.  No mass or adenopathy.   Original Report Authenticated By: Signa Kell, M.D.   US Breast Left  01/09/2013   *RADIOLOGY REPORT*  Clinical Data:  47 year old female with newly diagnosed right breast cancer.  Left mammogram to compare to presurgical MRI.  DIGITAL DIAGNOSTIC LEFT MAMMOGRAM WITH CAD AND LEFT BREAST ULTRASOUND:  Comparison:  06/11/2012 mammogram and 01/09/2013 MRI  Findings:  ACR Breast Density Category 3: The breast tissue is heterogeneously dense.  A possible circumscribed mass in the upper outer left breast is identified. No suspicious calcifications or  distortion identified.  Mammographic images were processed with CAD.  On physical exam, no palpable abnormalities identified within the upper left breast.  Ultrasound is performed, showing a 6 x 6 x 8 mm slightly irregular hypoechoic mass at the 11 o'clock position of the left breast 5 cm from the nipple An 11 x 7 x 9 mm slightly irregular hypoechoic mass at the 2 o'clock position of the left breast 4 cm from the nipple is also present. No enlarged or abnormal appearing left axillary  lymph nodes are identified.  IMPRESSION: Indeterminate 6 x 6 x 8 mm hypoechoic mass at the 11 o'clock position of the left breast and 11 x 7 x 9 mm hypoechoic mass in the 2 o'clock position of the left breast.  The 11 o'clock position mass probably represents the enhancing nodule identified on today's MRI.  Tissue sampling of both masses is recommended.  BI-RADS CATEGORY 4:  Suspicious abnormality - biopsy should be considered.  RECOMMENDATION: Ultrasound guided biopsies of both left breast masses.  The findings and recommendations were discussed with the patient and she desires to proceed with ultrasound guided left breast biopsies. These biopsies will be performed today but dictated in a separate report.  Results were also provided in writing at the conclusion of the visit.  If applicable, a reminder letter will be sent to the patient regarding the next appointment.   Original Report Authenticated By: Harmon Pier, M.D.   Ct Abdomen Pelvis W Contrast  01/22/2013   *RADIOLOGY REPORT*  Clinical Data:  High risk breast cancer.  Evaluate for metastatic disease.  CT CHEST, ABDOMEN AND PELVIS WITH CONTRAST  Technique:  Multidetector CT imaging of the chest, abdomen and pelvis was performed following the standard protocol during bolus administration of intravenous contrast.  Contrast: OMNIPAQUE IOHEXOL 300 MG/ML  SOLN  Comparison:  None  CT CHEST  Findings:  Lungs/pleura: There is no pleural effusion.  No suspicious pulmonary nodule  or mass noted.  Heart/Mediastinum: Heart size is normal.  No pericardial effusion. No mediastinal or hilar adenopathy.  Bones/Musculoskeletal:  Asymmetric increased soft tissue within the right breast parenchyma corresponding to patient's biopsy-proven invasive breast carcinoma.  There is associated overlying skin thickening.  Multiple enlarged right axillary lymph nodes are identified.  Index lymph node measures 1.7 cm, image 23/series 2. Several enhancing, sub centimeter retropectoral lymph nodes are identified.  IMPRESSION:  1.  No acute findings. 2.  Right breast invasive carcinoma with associated right axillary adenopathy.  CT ABDOMEN AND PELVIS  Findings:  There are no focal liver abnormalities identified.  The gallbladder appears normal.  There is no biliary dilatation.  The pancreas is unremarkable.  Normal appearance of the spleen.  The adrenal glands are both normal.  The kidneys are unremarkable. Urinary bladder appears normal.  The uterus is unremarkable. Corpus luteal cyst is noted in the left ovary measuring 1.6 cm.  No free fluid or fluid collections identified within the upper abdomen or pelvis.  The abdominal aorta has a normal caliber. There is no adenopathy identified within the upper abdomen.  No pelvic or inguinal adenopathy noted.  The stomach is normal.  The small bowel loops are unremarkable. Normal appearance of the colon.  Review of the visualized osseous structures is negative for aggressive lytic or sclerotic bone lesion.  IMPRESSION:  1.  No acute findings within the abdomen or pelvis. 2.  No mass or adenopathy.   Original Report Authenticated By: Signa Kell, M.D.   Mr Breast Bilateral W Wo Contrast  01/09/2013   *RADIOLOGY REPORT*  Clinical Data: Biopsy-proven right breast inflammatory carcinoma with primary invasive mammary carcinoma and previous biopsy and known right axillary metastatic disease.  Preoperative staging MRI.  BUN and creatinine were obtained on site at [facility]  Imaging at [address] Results:  BUN 10 mg/dL,  Creatinine 0.9 mg/dL.  BILATERAL BREAST MRI WITH AND WITHOUT CONTRAST  Technique: Multiplanar, multisequence MR images of both breasts were obtained prior to and following the intravenous administration of  13ml of Multihance.  Three dimensional images were evaluated at the independent DynaCad workstation.  Comparison:  Prior mammograms and ultrasound.  The patient underwent diagnostic mammography and ultrasound of the left breast today as no recent prior exam was available after the recent diagnosis in the right breast.  Findings: There is diffuse right breast skin thickening and edema, trabecular edema, and right axillary lymphadenopathy with maximal nodal measurement 3.1 cm short-axis diameter.  There is also a 5 mm internal mammary artery chain lymph node identified image 26 series 2. There is an incompletely visualized lymph node subjacent to the pectoralis minor muscle on the first image of the T2-weighted series.  There is a large irregular enhancing mass in the right breast 11 o'clock location with associated clip artifact measuring maximally 8.0 x 5.6 by 4.7 cm. This demonstrates washin/washout type enhancement kinetics.  This corresponds to the biopsy-proven invasive mammary carcinoma.  There are abnormal nodular areas of trabecular has been throughout the right breast involving all four quadrants. No abnormal underlying pectoralis enhancement or chest wall involvement is identified.  In the left breast 11 o'clock location, there is an 8 mm enhancing nodule corresponding to that seen at the examination earlier today. This area demonstrates plateau type enhancement kinetics.  No left- sided axillary or internal mammary artery chain lymphadenopathy is identified.  There is mild inhomogeneous nodular enhancement in the left breast two o'clock location at the site of the mass seen in this location earlier today, measuring 9 mm, measuring progressive  enhancement kinetics.  IMPRESSION: Large irregular enhancing right breast mass 11 o'clock location corresponding to biopsy-proven invasive mammary carcinoma, with metastatic right axillary lymphadenopathy as well as skin and trabecular thickening/enhancement compatible with the clinical diagnosis of inflammatory and/or locally advanced breast cancer.  5 mm abnormal right internal mammary artery chain lymph node.  8 mm enhancing mass left breast 11 o'clock location and mildly irregular area of mass-like enhancement in the left breast two o'clock location, corresponding to those seen at diagnostic examination earlier today.  Biopsy has been scheduled for today and will be dictated separately.  RECOMMENDATION: Treatment plan  THREE-DIMENSIONAL MR IMAGE RENDERING ON INDEPENDENT WORKSTATION:  Three-dimensional MR images were rendered by post-processing of the original MR data on an independent workstation.  The three- dimensional MR images were interpreted, and findings were reported in the accompanying complete MRI report for this study.  BI-RADS CATEGORY 6:  Known biopsy-proven malignancy - appropriate action should be taken.   Original Report Authenticated By: Christiana Pellant, M.D.   Nm Pet Image Initial (pi) Skull Base To Thigh  01/20/2013   *RADIOLOGY REPORT*  Clinical Data: Initial treatment strategy for breast cancer.  NUCLEAR MEDICINE PET SKULL BASE TO THIGH  Fasting Blood Glucose:  263  Technique:  19.1 mCi F-18 FDG was injected intravenously. CT data was obtained and used for attenuation correction and anatomic localization only.  (This was not acquired as a diagnostic CT examination.) Additional exam technical data entered on technologist worksheet.  Comparison:  None  Findings:  Neck: No hypermetabolic lymph nodes in the neck.  Chest:  No hypermetabolic mediastinal or hilar nodes.  No suspicious pulmonary nodules on the CT scan. Enlarged right axillary lymph nodes identified.  Index lymph node measures 1.7  cm and has an SUV max equal to 2.8, image 77.  There is asymmetric increased uptake throughout the right breast with associated overlying skin thickening.  This has an SUV max equal to 4.4, image 101.  Abdomen/Pelvis:  No  abnormal hypermetabolic activity within the liver, pancreas, adrenal glands, or spleen.  No hypermetabolic lymph nodes in the abdomen or pelvis. There is a small focus of increased FDG uptake within the left ovary.  This has an SUV max equal to 13.1, image 217.  Skeleton:  No focal hypermetabolic activity to suggest skeletal metastasis.  IMPRESSION:  1.  Asymmetric increased uptake associated with the right breast and right breast skin thickening corresponding to biopsy-proven invasive breast cancer. 2.  Enlarged and hypermetabolic right axillary lymph nodes consistent with metastatic adenopathy. 3.  No evidence for distant metastatic disease. 4.  Focal area of increased uptake within the left axilla.  The patient is noted to have a corpus luteal cyst are not diagnostic CT images.  This activity is favored to represent physiologic activity.  Consider confirmatory imaging with pelvic sonogram.   Original Report Authenticated By: Signa Kell, M.D.   Ir Fluoro Guide Cv Line Left  01/15/2013   *RADIOLOGY REPORT*  Clinical Data/Indication: Breast cancer  TUNNEL POWER PORT PLACEMENT WITH SUBCUTANEOUS POCKET UTILIZING ULTRASOUND & FLOUROSCOPY  Sedation: Versed 5.0 mg, Fentanyl 250 mcg.  Total Moderate Sedation Time: 35 minutes.  As antibiotic prophylaxis, Ancef  was ordered pre-procedure and administered intravenously within one hour of incision.  Fluoroscopy Time: 36 seconds.  Procedure:  After written informed consent was obtained, patient was placed in the supine position on angiographic table. The left neck and chest was prepped and draped in a sterile fashion. Lidocaine was utilized for local anesthesia.  The left internal jugular vein was noted to be patent initially with ultrasound. Under  sonographic guidance, a micropuncture needle was inserted into the left IJ vein (Ultrasound and fluoroscopic image documentation was performed). The needle was removed over an 018 wire which was exchanged for a Amplatz.  This was advanced into the IVC.  An 8-French dilator was advanced over the Amplatz.  A small incision was made in the left upper chest over the anterior left second rib.  Utilizing blunt dissection, a subcutaneous pocket was created in the caudal direction. The pocket was irrigated with a copious amount of sterile normal saline.  The port catheter was tunneled from the chest incision, and out the neck incision.  The reservoir was inserted into the subcutaneous pocket and secured with two 3-0 Ethilon stitches.  A peel-away sheath was advanced over the Amplatz wire.  The port catheter was cut to measure length and inserted through the peel-away sheath.  The peel-away sheath was removed.  The chest incision was closed with 3-0 Vicryl interrupted stitches for the subcutaneous tissue and a running of 4- 0 Vicryl subcuticular stitch for the skin.  The neck incision was closed with a 4-0 Vicryl subcuticular stitch.  Derma-bond was applied to both surgical incisions.  The port reservoir was flushed and instilled with heparinized saline.  No complications.  Findings:  A left IJ vein Port-A-Cath is in place with its tip at the cavoatrial junction.  IMPRESSION: Successful 8 French left internal jugular vein power port placement with its tip at the SVC/RA junction.   Original Report Authenticated By: Jolaine Click, M.D.   Ir US Guide Vasc Access Left  01/15/2013   *RADIOLOGY REPORT*  Clinical Data/Indication: Breast cancer  TUNNEL POWER PORT PLACEMENT WITH SUBCUTANEOUS POCKET UTILIZING ULTRASOUND & FLOUROSCOPY  Sedation: Versed 5.0 mg, Fentanyl 250 mcg.  Total Moderate Sedation Time: 35 minutes.  As antibiotic prophylaxis, Ancef  was ordered pre-procedure and administered intravenously within one hour of  incision.  Fluoroscopy Time: 36 seconds.  Procedure:  After written informed consent was obtained, patient was placed in the supine position on angiographic table. The left neck and chest was prepped and draped in a sterile fashion. Lidocaine was utilized for local anesthesia.  The left internal jugular vein was noted to be patent initially with ultrasound. Under sonographic guidance, a micropuncture needle was inserted into the left IJ vein (Ultrasound and fluoroscopic image documentation was performed). The needle was removed over an 018 wire which was exchanged for a Amplatz.  This was advanced into the IVC.  An 8-French dilator was advanced over the Amplatz.  A small incision was made in the left upper chest over the anterior left second rib.  Utilizing blunt dissection, a subcutaneous pocket was created in the caudal direction. The pocket was irrigated with a copious amount of sterile normal saline.  The port catheter was tunneled from the chest incision, and out the neck incision.  The reservoir was inserted into the subcutaneous pocket and secured with two 3-0 Ethilon stitches.  A peel-away sheath was advanced over the Amplatz wire.  The port catheter was cut to measure length and inserted through the peel-away sheath.  The peel-away sheath was removed.  The chest incision was closed with 3-0 Vicryl interrupted stitches for the subcutaneous tissue and a running of 4- 0 Vicryl subcuticular stitch for the skin.  The neck incision was closed with a 4-0 Vicryl subcuticular stitch.  Derma-bond was applied to both surgical incisions.  The port reservoir was flushed and instilled with heparinized saline.  No complications.  Findings:  A left IJ vein Port-A-Cath is in place with its tip at the cavoatrial junction.  IMPRESSION: Successful 8 French left internal jugular vein power port placement with its tip at the SVC/RA junction.   Original Report Authenticated By: Jolaine Click, M.D.   Mm Digital Diagnostic Unilat  L  01/09/2013   *RADIOLOGY REPORT*  Clinical Data:  Evaluate clip placement following two separate ultrasound-guided left breast biopsies.  DIGITAL DIAGNOSTIC LEFT MAMMOGRAM  Comparison:  Previous exams  Findings:  Films are performed following two separate ultrasound guided biopsies of the left breast - at the 11 o'clock position and 2 o'clock position. The ribbon shaped biopsy clip corresponds to the 11 o'clock mass and the T shaped biopsy clip corresponds to the 2 o'clock mass. No immediate complications are identified.  IMPRESSION: Satisfactory clip placement following ultrasound guided left breast biopsies.  Pathology will be followed.   Original Report Authenticated By: Harmon Pier, M.D.   Mm [facility]  01/09/2013   *RADIOLOGY REPORT*  Clinical Data:  47 year old female with newly diagnosed right breast cancer.  Left mammogram to compare to presurgical MRI.  DIGITAL DIAGNOSTIC LEFT MAMMOGRAM WITH CAD AND LEFT BREAST ULTRASOUND:  Comparison:  06/11/2012 mammogram and 01/09/2013 MRI  Findings:  ACR Breast Density Category 3: The breast tissue is heterogeneously dense.  A possible circumscribed mass in the upper outer left breast is identified. No suspicious calcifications or distortion identified.  Mammographic images were processed with CAD.  On physical exam, no palpable abnormalities identified within the upper left breast.  Ultrasound is performed, showing a 6 x 6 x 8 mm slightly irregular hypoechoic mass at the 11 o'clock position of the left breast 5 cm from the nipple An 11 x 7 x 9 mm slightly irregular hypoechoic mass at the 2 o'clock position of the left breast 4 cm from the nipple is also present. No enlarged or  abnormal appearing left axillary lymph nodes are identified.  IMPRESSION: Indeterminate 6 x 6 x 8 mm hypoechoic mass at the 11 o'clock position of the left breast and 11 x 7 x 9 mm hypoechoic mass in the 2 o'clock position of the left breast.  The 11 o'clock position mass  probably represents the enhancing nodule identified on today's MRI.  Tissue sampling of both masses is recommended.  BI-RADS CATEGORY 4:  Suspicious abnormality - biopsy should be considered.  RECOMMENDATION: Ultrasound guided biopsies of both left breast masses.  The findings and recommendations were discussed with the patient and she desires to proceed with ultrasound guided left breast biopsies. These biopsies will be performed today but dictated in a separate report.  Results were also provided in writing at the conclusion of the visit.  If applicable, a reminder letter will be sent to the patient regarding the next appointment.   Original Report Authenticated By: Harmon Pier, M.D.   [place] Lt Breast Bx W Loc Dev 1st Lesion Img Bx Spec US Guide  01/12/2013   *RADIOLOGY REPORT*  Clinical Data:  47 year old female with newly diagnosed right breast cancer.  Two left breast masses identified sonographically - 6 x 8 mm at the 11 o'clock position and 9 x 11 mm at the 2 o'clock position. For tissue sampling of the 6 x 8 mm mass at the 11 o'clock position.  ULTRASOUND GUIDED VACUUM ASSISTED CORE BIOPSY OF THE LEFT BREAST  Comparison: Previous exams.  I met with the patient and we discussed the procedure of ultrasound- guided biopsy, including benefits and alternatives.  We discussed the high likelihood of a successful procedure. We discussed the risks of the procedure including infection, bleeding, tissue injury, clip migration, and inadequate sampling.  Informed written consent was given.  Using sterile technique and 2% Lidocaine as local anesthetic, under direct ultrasound visualization, a12 gauge vacuum-assisteddevice was used to perform biopsy of the 6 x 8 mm hypoechoic mass at the 11 o'clock position of the left breast 5 cm from the nipple using a medial approach. At the conclusion of the procedure, a ribbon shaped tissue marker clip was deployed into the biopsy cavity. Follow-up 2-view mammogram was performed and  dictated separately.  The usual time-out protocol was performed immediately prior to the procedure.  IMPRESSION: Ultrasound-guided biopsy of 6 x 8 mm hypoechoic mass at the 11 o'clock position of the left breast.  No apparent complications.  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  Histology correlates with imaging findings.  The patient was contacted by phone on 01/12/2013 and these results given to her which she understood. Her questions were answered. The patient had no complaints with her biopsy site.  Recommend treatment plan.   Original Report Authenticated By: Harmon Pier, M.D.   [place] Lt Breast Bx W Loc Dev Ea Add Lesion Img Bx Spec US Guide  01/12/2013   *RADIOLOGY REPORT*  Clinical Data:  47 year old female with newly diagnosed right breast cancer.  Two left breast masses identified sonographically - 6 x 8 mm at the 11 o'clock position and 9 x 11 mm at the 2 o'clock position. For tissue sampling of the 9 x 11 mm mass at the 2 o'clock position.  ULTRASOUND GUIDED VACUUM ASSISTED CORE BIOPSY OF THE LEFT BREAST  Comparison: Previous exams.  I met with the patient and we discussed the procedure of ultrasound- guided biopsy, including benefits and alternatives.  We discussed the high likelihood of  a successful procedure. We discussed the risks of the procedure including infection, bleeding, tissue injury, clip migration, and inadequate sampling.  Informed written consent was given.  Using sterile technique and 2% Lidocaine as local anesthetic, under direct ultrasound visualization, a 12 gauge vacuum-assisteddevice was used to perform biopsy of the 9 x 11 mm hypoechoic mass at the 2 o'clock position of the left breast 4 cm from the nipple using a medial approach. At the conclusion of the procedure, a  tissue marker clip was deployed into the biopsy cavity.  Follow-up 2-view mammogram was performed and dictated separately.  The usual time-out protocol  was performed immediately prior to the procedure.  IMPRESSION: Ultrasound-guided biopsy of 9 x 11 mm mass in the 2 o'clock position of the left breast.  No apparent complications.  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 Histology correlates with imaging findings.  The patient was contacted by phone on 01/12/2013 and these results given to her which she understood. Her questions were answered. The patient had no complaints with her biopsy site.  Recommend treatment plan.   Original Report Authenticated By: Harmon Pier, M.D.     LABS:    Chemistry      Component Value Date/Time   NA 134* 01/23/2013 1251   NA 140 03/31/2012 0934   K 4.0 01/23/2013 1251   K 4.7 03/31/2012 0934   CL 99 01/23/2013 1251   CL 106 03/31/2012 0934   CO2 27 01/23/2013 1251   CO2 27 03/31/2012 0934   BUN 7.5 01/23/2013 1251   BUN 13 03/31/2012 0934   CREATININE 0.8 01/23/2013 1251   CREATININE 0.6 03/31/2012 0934      Component Value Date/Time   CALCIUM 8.7 01/23/2013 1251   CALCIUM 8.9 03/31/2012 0934   ALKPHOS 70 01/23/2013 1251   ALKPHOS 42 03/31/2012 0934   AST 13 01/23/2013 1251   AST 21 03/31/2012 0934   ALT 12 01/23/2013 1251   ALT 15 03/31/2012 0934   BILITOT 0.78 01/23/2013 1251   BILITOT 0.7 03/31/2012 0934      Lab Results  Component Value Date   WBC 13.8* 01/23/2013   HGB 14.0 01/23/2013   HCT 40.3 01/23/2013   MCV 86.7 01/23/2013   PLT 164 01/23/2013   PATHOLOGY: ADDITIONAL INFORMATION: 1. CHROMOGENIC IN-SITU HYBRIDIZATION Results: HER2/NEU BY CISH - SHOWS AMPLIFICATION BY CISH ANALYSIS. RESULT RATIO OF HER2: CEP 17 SIGNALS 3.48 AVERAGE HER2 COPY NUMBER PER CELL 9.40 REFERENCE RANGE NEGATIVE HER2/Chr17 Ratio <2.0 and Average HER2 copy number <4.0 EQUIVOCAL HER2/Chr17 Ratio <2.0 and Average HER2 copy number 4.0 and <6.0 POSITIVE HER2/Chr17 Ratio >=2.0 and/or Average HER2 copy number >=6.0 Jimmy Picket MD Pathologist, Electronic Signature (  Signed 01/12/2013) 1. PROGNOSTIC INDICATORS - ACIS Results: IMMUNOHISTOCHEMICAL AND MORPHOMETRIC ANALYSIS BY THE AUTOMATED CELLULAR IMAGING SYSTEM (ACIS) Estrogen Receptor: 0%, NEGATIVE Progesterone Receptor: 0%, NEGATIVE Proliferation Marker Ki67: 76% COMMENT: The negative hormone receptor study(ies) in this case have an internal positive control. REFERENCE RANGE ESTROGEN RECEPTOR NEGATIVE <1% 1 of 3 FINAL for BROOKLYN, ALFREDO ([id number]) ADDITIONAL INFORMATION:(continued) POSITIVE =>1% PROGESTERONE RECEPTOR NEGATIVE <1% POSITIVE =>1% All controls stained appropriately Pecola Leisure MD Pathologist, Electronic Signature ( Signed 01/12/2013) FINAL DIAGNOSIS Diagnosis 1. Breast, right, needle core biopsy, mass - INVASIVE MAMMARY CARCINOMA. - SEE COMMENT. 2. Lymph node, needle/core biopsy, right axilla - METASTATIC CARCINOMA IN 1 OF 1 LYMPH NODE (1/1). Microscopic Comment 1. The features favor grade II-III invasive ductal carcinoma. A breast  prognostic profile will be performed and the results reported separately. The results were called to the [facility] on 01/02/2013. 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 Tumor is ER negative PR negative HER-2/neu positive with an elevated Ki-67. Patient also has a positive lymph node. (T3 N1). Patient is seen in medical oncology for discussion of treatment options. Patient was seen by Dr. Claud Kelp was recommended neoadjuvant chemotherapy.  #2 patient and I and her husband discussed her radiology pathology and treatment options. She understands that chemotherapy that is given preoperatively to try to shrink the tumor however she still need a mastectomy. We discussed her pathology with her prognostic markers. Patient's tumor is HER-2/neu  positive so therefore she will get her2 based chemotherapy. Combination discussed with her dense Taxotere carboplatinum with Herceptin and perjeta to be given every 3 weeks for a total of 6 cycles. Once she completes this she will have another MRI performed to evaluate response to therapy. She will then go on to surgery. After surgery she will proceed with Herceptin every 3 weeks to complete a total of one year.  #3 patient has also been seen by radiation oncology and patient will need post surgery radiation therapy. She has seen Dr. Lurline Hare.  #4 we discussed the logistics of administering chemotherapy. Patient will need a Port-A-Cath placed and I have asked Dr. Derrell Lolling to do so, she will also need chemotherapy teaching class, she will also need echocardiogram.  #5 because patient is receiving Herceptin based chemotherapy I will refer her to cardiology CHF clinic for them to follow her along with [place].  #6 patient is also recommended having staging studies performed including PET scan and CT scan and I will have this ordered. This is primarily to rule out metastatic disease.  Clinical Trial Eligibility:no Multidisciplinary conference discussionyes     PLAN:    #1 patient will proceed with systemic chemotherapy consisting of TCH with perjeta. But prior to that she will need echocardiogram chemotherapy teaching class Port-A-Cath placed.  #2 we will refer her to cardiology.  #3 I will see her back in one week's time for chemotherapy        Discussion: Patient is being treated per NCCN breast cancer care guidelines appropriate for stage.III   Thank you so much for allowing me to participate in the care of Jasmine Schwartz. I will continue to follow up the patient with you and assist in her care.  All questions were answered. The patient knows to call the clinic with any problems, questions or concerns. We can certainly see the patient much sooner if necessary.  I spent 55 minutes  counseling the patient face to face. The total time spent in the appointment was 60 minutes.  Drue Second, MD Medical/Oncology [facility] [phone] (beeper) [phone] (Office)  02/02/2013, 11:31 PM

## 2013-02-06 ENCOUNTER — Encounter: Payer: Self-pay | Admitting: Oncology

## 2013-02-06 ENCOUNTER — Other Ambulatory Visit (HOSPITAL_BASED_OUTPATIENT_CLINIC_OR_DEPARTMENT_OTHER): Payer: BC Managed Care – PPO | Admitting: Lab

## 2013-02-06 ENCOUNTER — Ambulatory Visit (HOSPITAL_BASED_OUTPATIENT_CLINIC_OR_DEPARTMENT_OTHER): Payer: BC Managed Care – PPO

## 2013-02-06 ENCOUNTER — Ambulatory Visit (HOSPITAL_BASED_OUTPATIENT_CLINIC_OR_DEPARTMENT_OTHER): Payer: BC Managed Care – PPO | Admitting: Oncology

## 2013-02-06 VITALS — BP 114/80 | HR 88 | Temp 98.3°F | Resp 20 | Ht 64.0 in | Wt 140.2 lb

## 2013-02-06 DIAGNOSIS — C773 Secondary and unspecified malignant neoplasm of axilla and upper limb lymph nodes: Secondary | ICD-10-CM

## 2013-02-06 DIAGNOSIS — Z5111 Encounter for antineoplastic chemotherapy: Secondary | ICD-10-CM

## 2013-02-06 DIAGNOSIS — C50911 Malignant neoplasm of unspecified site of right female breast: Secondary | ICD-10-CM

## 2013-02-06 DIAGNOSIS — C50419 Malignant neoplasm of upper-outer quadrant of unspecified female breast: Secondary | ICD-10-CM

## 2013-02-06 DIAGNOSIS — Z5112 Encounter for antineoplastic immunotherapy: Secondary | ICD-10-CM

## 2013-02-06 DIAGNOSIS — Z171 Estrogen receptor negative status [ER-]: Secondary | ICD-10-CM

## 2013-02-06 LAB — COMPREHENSIVE METABOLIC PANEL (CC13)
ALT: 15 U/L (ref 0–55)
AST: 10 U/L (ref 5–34)
Alkaline Phosphatase: 57 U/L (ref 40–150)
Creatinine: 0.8 mg/dL (ref 0.6–1.1)
Total Bilirubin: 0.69 mg/dL (ref 0.20–1.20)

## 2013-02-06 LAB — CBC WITH DIFFERENTIAL/PLATELET
BASO%: 0.1 % (ref 0.0–2.0)
EOS%: 0 % (ref 0.0–7.0)
HCT: 34.9 % (ref 34.8–46.6)
LYMPH%: 9 % — ABNORMAL LOW (ref 14.0–49.7)
MCH: 30.3 pg (ref 25.1–34.0)
MCHC: 34.7 g/dL (ref 31.5–36.0)
NEUT%: 84.4 % — ABNORMAL HIGH (ref 38.4–76.8)
Platelets: 371 10*3/uL (ref 145–400)
RBC: 4 10*6/uL (ref 3.70–5.45)

## 2013-02-06 MED ORDER — SODIUM CHLORIDE 0.9 % IV SOLN
Freq: Once | INTRAVENOUS | Status: AC
  Administered 2013-02-06: 14:00:00 via INTRAVENOUS

## 2013-02-06 MED ORDER — SODIUM CHLORIDE 0.9 % IJ SOLN
10.0000 mL | INTRAMUSCULAR | Status: DC | PRN
  Administered 2013-02-06: 10 mL
  Filled 2013-02-06: qty 10

## 2013-02-06 MED ORDER — SODIUM CHLORIDE 0.9 % IV SOLN
420.0000 mg | Freq: Once | INTRAVENOUS | Status: AC
  Administered 2013-02-06: 420 mg via INTRAVENOUS
  Filled 2013-02-06: qty 14

## 2013-02-06 MED ORDER — SODIUM CHLORIDE 0.9 % IV SOLN
75.0000 mg/m2 | Freq: Once | INTRAVENOUS | Status: AC
  Administered 2013-02-06: 130 mg via INTRAVENOUS
  Filled 2013-02-06: qty 13

## 2013-02-06 MED ORDER — DIPHENHYDRAMINE HCL 25 MG PO CAPS
50.0000 mg | ORAL_CAPSULE | Freq: Once | ORAL | Status: AC
  Administered 2013-02-06: 50 mg via ORAL

## 2013-02-06 MED ORDER — HEPARIN SOD (PORK) LOCK FLUSH 100 UNIT/ML IV SOLN
500.0000 [IU] | Freq: Once | INTRAVENOUS | Status: AC | PRN
  Administered 2013-02-06: 500 [IU]
  Filled 2013-02-06: qty 5

## 2013-02-06 MED ORDER — SODIUM CHLORIDE 0.9 % IV SOLN
695.4000 mg | Freq: Once | INTRAVENOUS | Status: AC
  Administered 2013-02-06: 700 mg via INTRAVENOUS
  Filled 2013-02-06: qty 70

## 2013-02-06 MED ORDER — TRASTUZUMAB CHEMO INJECTION 440 MG
6.0000 mg/kg | Freq: Once | INTRAVENOUS | Status: AC
  Administered 2013-02-06: 399 mg via INTRAVENOUS
  Filled 2013-02-06: qty 19

## 2013-02-06 MED ORDER — DEXAMETHASONE SODIUM PHOSPHATE 20 MG/5ML IJ SOLN
20.0000 mg | Freq: Once | INTRAMUSCULAR | Status: AC
  Administered 2013-02-06: 20 mg via INTRAVENOUS

## 2013-02-06 MED ORDER — ONDANSETRON 16 MG/50ML IVPB (CHCC)
16.0000 mg | Freq: Once | INTRAVENOUS | Status: AC
  Administered 2013-02-06: 16 mg via INTRAVENOUS

## 2013-02-06 MED ORDER — ACETAMINOPHEN 325 MG PO TABS
650.0000 mg | ORAL_TABLET | Freq: Once | ORAL | Status: AC
  Administered 2013-02-06: 650 mg via ORAL

## 2013-02-06 MED ORDER — LORAZEPAM 2 MG/ML IJ SOLN
0.5000 mg | Freq: Once | INTRAMUSCULAR | Status: AC
  Administered 2013-02-06: 0.5 mg via INTRAVENOUS

## 2013-02-06 NOTE — Progress Notes (Signed)
OFFICE PROGRESS NOTE  CC  [name], MD [address] [place] [address] Dr. Lurline Hare  Dr. 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 At that time she had mammogram performed and it was negative. However her breast continue to increase in size. Initially patient felt that it could have been cysts or something on and therefore she did not go to a physician. However subsequently patient's breast became quite painful and she also began to feel in knot under her right arm.because of this she went to her physician who did a physical exam and a mammogram was performed on 01/01/2013 the mammogram showed diffuse skin thickening generalized increased density within the right breast and enlarged right axillary lymph nodes. She went on to have an ultrasound of the right breast performed that showed a large spiculated hypoechoic mass in the right breast 11:00 2 cm from the nipple measuring at least 4.5 cm in size. There were abnormal lymph nodes with thickened cortex and the right axilla the largest one measuring 3.1 cm in dimension.  #2Patient was recommended ultrasound guided core biopsy of the right breast mass and the right axilla lymph node. The biopsy was performed on 01/01/2013. The right needle core biopsy of the primary mass showed invasive mammary carcinoma grade 2-3 was invasive ductal. The lymph node of the right axilla was positive for metastatic disease. The tumor was ER PR negative HER-2/neu positive with a Ki-67 of 76%. On 01/09/2013 patient had MRI of the breasts performed that showed diffuse right breast skin thickening and edema with right axillary lymph node. There was a 5 mm internal mammary lymph node that  was incompletely imaged. The right breast mass measured 8 x 5 x 4.7 cm. There were abnormal areas involving all 4 quadrants. There was however no involvement of the pectoralis minor for lower chest wall. The left breast showed 2 areas of nodular enhancement these were biopsied and were fibroadenomas  #3 it was recommended that patient begin neoadjuvant chemotherapy consisting of Taxotere carboplatinum Herceptin and perjeta. This would be given every 3 weeks with day 2 Neulasta. A total of 6 cycles of therapy were planned. Once she completes the treatment then she would proceed with MRI of the breasts again for evaluation a response to therapy and eventually a mastectomy.  #4 patient began Taxotere carboplatinum Herceptin and perjeta starting 01/16/2013.  CURRENT THERAPY: Cycle 2 day 2 of TCH/perjeta with day 2 Neulasta on 02/07/2013  INTERVAL HISTORY: LEANNE SISLER 47 y.o. female returns for followup visit today overall she is doing well she is without any complaints he feels much better in comparison to her last visit. Her blood counts are back to normal. She denies any fevers chills night sweats headaches shortness of breath chest pains palpitations no peripheral paresthesias no vaginal bleeding or discharge no cough or hemoptysis no skin rashes. She is accompanied by her twin boys and her mother today. Manger of the template review of systems is negative.  MEDICAL HISTORY: Past Medical History  Diagnosis Date  . Anxiety   . Depression   . Dyslipidemia   . Anxiety and depression 07/06/2011  . Breast cancer 01/01/13    /metastatic 1/1 lymph nodes  . Diabetes mellitus  IDDM  . Hx of insertion of insulin pump     ALLERGIES:  has No Known Allergies.  MEDICATIONS:  Current Outpatient Prescriptions  Medication Sig Dispense Refill  . Alum & Mag Hydroxide-Simeth (MAGIC MOUTHWASH W/LIDOCAINE) SOLN Take 5 mLs by mouth 4 (four) times daily as needed. Swish and spit.  120 mL  1  .  dexamethasone (DECADRON) 4 MG tablet Take 8 mg by mouth 2 (two) times daily with a meal. Take two times a day the day before Taxotere. Then take two times a day starting the day after chemo for 3 days.      . fluconazole (DIFLUCAN) 200 MG tablet Take 1 tablet (200 mg total) by mouth daily.  5 tablet  5  . glucose blood (NOVA MAX TEST) test strip Use as directed up to 8 times a day      . Insulin Infusion Pump (MINIMED INSULIN PUMP) DEVI Inject 1 Units/hr into the vein continuous. Pt give extra per cards at meal times. Basic meals are about 5 units      . Insulin Infusion Pump Supplies (MINIMED INFUSION SET-MMT 397) MISC 1 Device by Does not apply route every 3 (three) days.      . Insulin Infusion Pump Supplies (PARADIGM PUMP RESERVOIR 1.76ML) MISC 1 Device by Does not apply route every 3 (three) days.      . insulin lispro (HUMALOG) 100 UNIT/ML injection Inject as directed into Insulin pump, total of 40 units/day      . lidocaine-prilocaine (EMLA) cream Apply topically as needed. Apply to port-a-cath site 1-2 hours before treatment.  30 g  3  . LORazepam (ATIVAN) 0.5 MG tablet Take 0.5 mg by mouth every 6 (six) hours as needed (Nausea or vomiting).      . ondansetron (ZOFRAN) 8 MG tablet Take 8 mg by mouth 2 (two) times daily. Take two times a day starting the day after chemo for 3 days. Then take two times a day as needed for nausea or vomiting.      . valACYclovir (VALTREX) 500 MG tablet Take 1 tablet (500 mg total) by mouth 2 (two) times daily.  30 tablet  4  . ALPRAZolam (XANAX) 0.25 MG tablet Take 0.25 mg by mouth 3 (three) times daily as needed for sleep or anxiety.      . prochlorperazine (COMPAZINE) 10 MG tablet Take 1 tablet (10 mg total) by mouth every 6 (six) hours as needed (Nausea or vomiting).  30 tablet  1  . prochlorperazine (COMPAZINE) 25 MG suppository Place 25 mg rectally every 12 (twelve) hours as needed for nausea.       No current facility-administered medications for this  visit.   Facility-Administered Medications Ordered in Other Visits  Medication Dose Route Frequency Provider Last Rate Last Dose  . CARBOplatin (PARAPLATIN) 700 mg in sodium chloride 0.9 % 250 mL chemo infusion  700 mg Intravenous Once Victorino December, MD      . DOCEtaxel (TAXOTERE) 130 mg in sodium chloride 0.9 % 250 mL chemo infusion  75 mg/m2 (Treatment Plan Actual) Intravenous Once Victorino December, MD 263 mL/hr at 02/06/13 1547 130 mg at 02/06/13 1547  . heparin lock flush 100 unit/mL  500 Units Intracatheter Once PRN Victorino December, MD      . sodium chloride 0.9 % injection 10 mL  10 mL Intracatheter PRN Victorino December, MD        SURGICAL HISTORY:  Past Surgical History  Procedure Laterality Date  .  Lumbar spine surgery  2009    Dr Jillyn Hidden  . Cesarean section  1989  . Tubal ligation  1991  . Breast biopsy Right 01/01/13    Invasive mammary ca,metastatic in 1/1 lymph node  . Breast biopsy Left 01/09/13    Fibroadenoma,microcalcifications    REVIEW OF SYSTEMS:  Pertinent items are noted in HPI.   HEALTH MAINTENANCE:  PHYSICAL EXAMINATION: Blood pressure 114/80, pulse 88, temperature 98.3 F (36.8 C), temperature source Oral, resp. rate 20, height 5\' 4"  (1.626 m), weight 140 lb 3.2 oz (63.594 kg), last menstrual period 01/04/2013. Body mass index is 24.05 kg/(m^2). ECOG PERFORMANCE STATUS: 0 - Asymptomatic   General appearance: alert, cooperative and appears stated age Resp: clear to auscultation bilaterally Cardio: regular rate and rhythm GI: soft, non-tender; bowel sounds normal; no masses,  no organomegaly Extremities: extremities normal, atraumatic, no cyanosis or edema Neurologic: Grossly normal Right breast reveals a large palpable mass without any nipple discharge it is softer Left breast no masses or nipple discharge  LABORATORY DATA: Lab Results  Component Value Date   WBC 17.6* 02/06/2013   HGB 12.1 02/06/2013   HCT 34.9 02/06/2013   MCV 87.3 02/06/2013   PLT 371  02/06/2013      Chemistry      Component Value Date/Time   NA 138 02/06/2013 1107   NA 140 03/31/2012 0934   K 4.0 02/06/2013 1107   K 4.7 03/31/2012 0934   CL 99 01/23/2013 1251   CL 106 03/31/2012 0934   CO2 25 02/06/2013 1107   CO2 27 03/31/2012 0934   BUN 18.2 02/06/2013 1107   BUN 13 03/31/2012 0934   CREATININE 0.8 02/06/2013 1107   CREATININE 0.6 03/31/2012 0934      Component Value Date/Time   CALCIUM 9.2 02/06/2013 1107   CALCIUM 8.9 03/31/2012 0934   ALKPHOS 57 02/06/2013 1107   ALKPHOS 42 03/31/2012 0934   AST 10 02/06/2013 1107   AST 21 03/31/2012 0934   ALT 15 02/06/2013 1107   ALT 15 03/31/2012 0934   BILITOT 0.69 02/06/2013 1107   BILITOT 0.7 03/31/2012 0934       RADIOGRAPHIC STUDIES:  Ct Chest W Contrast  01/20/2013   *RADIOLOGY REPORT*  Clinical Data:  High risk breast cancer.  Evaluate for metastatic disease.  CT CHEST, ABDOMEN AND PELVIS WITH CONTRAST  Technique:  Multidetector CT imaging of the chest, abdomen and pelvis was performed following the standard protocol during bolus administration of intravenous contrast.  Contrast: OMNIPAQUE IOHEXOL 300 MG/ML  SOLN  Comparison:  None  CT CHEST  Findings:  Lungs/pleura: There is no pleural effusion.  No suspicious pulmonary nodule or mass noted.  Heart/Mediastinum: Heart size is normal.  No pericardial effusion. No mediastinal or hilar adenopathy.  Bones/Musculoskeletal:  Asymmetric increased soft tissue within the right breast parenchyma corresponding to patient's biopsy-proven invasive breast carcinoma.  There is associated overlying skin thickening.  Multiple enlarged right axillary lymph nodes are identified.  Index lymph node measures 1.7 cm, image 23/series 2. Several enhancing, sub centimeter retropectoral lymph nodes are identified.  IMPRESSION:  1.  No acute findings. 2.  Right breast invasive carcinoma with associated right axillary adenopathy.  CT ABDOMEN AND PELVIS  Findings:  There are no focal liver abnormalities  identified.  The gallbladder appears normal.  There is no biliary dilatation.  The pancreas is unremarkable.  Normal appearance of the spleen.  The adrenal glands are both normal.  The kidneys are unremarkable. Urinary  bladder appears normal.  The uterus is unremarkable. Corpus luteal cyst is noted in the left ovary measuring 1.6 cm.  No free fluid or fluid collections identified within the upper abdomen or pelvis.  The abdominal aorta has a normal caliber. There is no adenopathy identified within the upper abdomen.  No pelvic or inguinal adenopathy noted.  The stomach is normal.  The small bowel loops are unremarkable. Normal appearance of the colon.  Review of the visualized osseous structures is negative for aggressive lytic or sclerotic bone lesion.  IMPRESSION:  1.  No acute findings within the abdomen or pelvis. 2.  No mass or adenopathy.   Original Report Authenticated By: Signa Kell, M.D.   US Breast Left  01/09/2013   *RADIOLOGY REPORT*  Clinical Data:  47 year old female with newly diagnosed right breast cancer.  Left mammogram to compare to presurgical MRI.  DIGITAL DIAGNOSTIC LEFT MAMMOGRAM WITH CAD AND LEFT BREAST ULTRASOUND:  Comparison:  06/11/2012 mammogram and 01/09/2013 MRI  Findings:  ACR Breast Density Category 3: The breast tissue is heterogeneously dense.  A possible circumscribed mass in the upper outer left breast is identified. No suspicious calcifications or distortion identified.  Mammographic images were processed with CAD.  On physical exam, no palpable abnormalities identified within the upper left breast.  Ultrasound is performed, showing a 6 x 6 x 8 mm slightly irregular hypoechoic mass at the 11 o'clock position of the left breast 5 cm from the nipple An 11 x 7 x 9 mm slightly irregular hypoechoic mass at the 2 o'clock position of the left breast 4 cm from the nipple is also present. No enlarged or abnormal appearing left axillary lymph nodes are identified.  IMPRESSION:  Indeterminate 6 x 6 x 8 mm hypoechoic mass at the 11 o'clock position of the left breast and 11 x 7 x 9 mm hypoechoic mass in the 2 o'clock position of the left breast.  The 11 o'clock position mass probably represents the enhancing nodule identified on today's MRI.  Tissue sampling of both masses is recommended.  BI-RADS CATEGORY 4:  Suspicious abnormality - biopsy should be considered.  RECOMMENDATION: Ultrasound guided biopsies of both left breast masses.  The findings and recommendations were discussed with the patient and she desires to proceed with ultrasound guided left breast biopsies. These biopsies will be performed today but dictated in a separate report.  Results were also provided in writing at the conclusion of the visit.  If applicable, a reminder letter will be sent to the patient regarding the next appointment.   Original Report Authenticated By: Harmon Pier, M.D.   Ct Abdomen Pelvis W Contrast  01/22/2013   *RADIOLOGY REPORT*  Clinical Data:  High risk breast cancer.  Evaluate for metastatic disease.  CT CHEST, ABDOMEN AND PELVIS WITH CONTRAST  Technique:  Multidetector CT imaging of the chest, abdomen and pelvis was performed following the standard protocol during bolus administration of intravenous contrast.  Contrast: OMNIPAQUE IOHEXOL 300 MG/ML  SOLN  Comparison:  None  CT CHEST  Findings:  Lungs/pleura: There is no pleural effusion.  No suspicious pulmonary nodule or mass noted.  Heart/Mediastinum: Heart size is normal.  No pericardial effusion. No mediastinal or hilar adenopathy.  Bones/Musculoskeletal:  Asymmetric increased soft tissue within the right breast parenchyma corresponding to patient's biopsy-proven invasive breast carcinoma.  There is associated overlying skin thickening.  Multiple enlarged right axillary lymph nodes are identified.  Index lymph node measures 1.7 cm, image 23/series 2. Several enhancing, sub  centimeter retropectoral lymph nodes are identified.   IMPRESSION:  1.  No acute findings. 2.  Right breast invasive carcinoma with associated right axillary adenopathy.  CT ABDOMEN AND PELVIS  Findings:  There are no focal liver abnormalities identified.  The gallbladder appears normal.  There is no biliary dilatation.  The pancreas is unremarkable.  Normal appearance of the spleen.  The adrenal glands are both normal.  The kidneys are unremarkable. Urinary bladder appears normal.  The uterus is unremarkable. Corpus luteal cyst is noted in the left ovary measuring 1.6 cm.  No free fluid or fluid collections identified within the upper abdomen or pelvis.  The abdominal aorta has a normal caliber. There is no adenopathy identified within the upper abdomen.  No pelvic or inguinal adenopathy noted.  The stomach is normal.  The small bowel loops are unremarkable. Normal appearance of the colon.  Review of the visualized osseous structures is negative for aggressive lytic or sclerotic bone lesion.  IMPRESSION:  1.  No acute findings within the abdomen or pelvis. 2.  No mass or adenopathy.   Original Report Authenticated By: Signa Kell, M.D.   Mr Breast Bilateral W Wo Contrast  01/09/2013   *RADIOLOGY REPORT*  Clinical Data: Biopsy-proven right breast inflammatory carcinoma with primary invasive mammary carcinoma and previous biopsy and known right axillary metastatic disease.  Preoperative staging MRI.  BUN and creatinine were obtained on site at [facility] at [address] Results:  BUN 10 mg/dL,  Creatinine 0.9 mg/dL.  BILATERAL BREAST MRI WITH AND WITHOUT CONTRAST  Technique: Multiplanar, multisequence MR images of both breasts were obtained prior to and following the intravenous administration of 13ml of Multihance.  Three dimensional images were evaluated at the independent DynaCad workstation.  Comparison:  Prior mammograms and ultrasound.  The patient underwent diagnostic mammography and ultrasound of the left breast today as no recent prior exam  was available after the recent diagnosis in the right breast.  Findings: There is diffuse right breast skin thickening and edema, trabecular edema, and right axillary lymphadenopathy with maximal nodal measurement 3.1 cm short-axis diameter.  There is also a 5 mm internal mammary artery chain lymph node identified image 26 series 2. There is an incompletely visualized lymph node subjacent to the pectoralis minor muscle on the first image of the T2-weighted series.  There is a large irregular enhancing mass in the right breast 11 o'clock location with associated clip artifact measuring maximally 8.0 x 5.6 by 4.7 cm. This demonstrates washin/washout type enhancement kinetics.  This corresponds to the biopsy-proven invasive mammary carcinoma.  There are abnormal nodular areas of trabecular has been throughout the right breast involving all four quadrants. No abnormal underlying pectoralis enhancement or chest wall involvement is identified.  In the left breast 11 o'clock location, there is an 8 mm enhancing nodule corresponding to that seen at the examination earlier today. This area demonstrates plateau type enhancement kinetics.  No left- sided axillary or internal mammary artery chain lymphadenopathy is identified.  There is mild inhomogeneous nodular enhancement in the left breast two o'clock location at the site of the mass seen in this location earlier today, measuring 9 mm, measuring progressive enhancement kinetics.  IMPRESSION: Large irregular enhancing right breast mass 11 o'clock location corresponding to biopsy-proven invasive mammary carcinoma, with metastatic right axillary lymphadenopathy as well as skin and trabecular thickening/enhancement compatible with the clinical diagnosis of inflammatory and/or locally advanced breast cancer.  5 mm abnormal right internal mammary artery chain lymph  node.  8 mm enhancing mass left breast 11 o'clock location and mildly irregular area of mass-like enhancement in the  left breast two o'clock location, corresponding to those seen at diagnostic examination earlier today.  Biopsy has been scheduled for today and will be dictated separately.  RECOMMENDATION: Treatment plan  THREE-DIMENSIONAL MR IMAGE RENDERING ON INDEPENDENT WORKSTATION:  Three-dimensional MR images were rendered by post-processing of the original MR data on an independent workstation.  The three- dimensional MR images were interpreted, and findings were reported in the accompanying complete MRI report for this study.  BI-RADS CATEGORY 6:  Known biopsy-proven malignancy - appropriate action should be taken.   Original Report Authenticated By: Christiana Pellant, M.D.   Nm Pet Image Initial (pi) Skull Base To Thigh  01/20/2013   *RADIOLOGY REPORT*  Clinical Data: Initial treatment strategy for breast cancer.  NUCLEAR MEDICINE PET SKULL BASE TO THIGH  Fasting Blood Glucose:  263  Technique:  19.1 mCi F-18 FDG was injected intravenously. CT data was obtained and used for attenuation correction and anatomic localization only.  (This was not acquired as a diagnostic CT examination.) Additional exam technical data entered on technologist worksheet.  Comparison:  None  Findings:  Neck: No hypermetabolic lymph nodes in the neck.  Chest:  No hypermetabolic mediastinal or hilar nodes.  No suspicious pulmonary nodules on the CT scan. Enlarged right axillary lymph nodes identified.  Index lymph node measures 1.7 cm and has an SUV max equal to 2.8, image 77.  There is asymmetric increased uptake throughout the right breast with associated overlying skin thickening.  This has an SUV max equal to 4.4, image 101.  Abdomen/Pelvis:  No abnormal hypermetabolic activity within the liver, pancreas, adrenal glands, or spleen.  No hypermetabolic lymph nodes in the abdomen or pelvis. There is a small focus of increased FDG uptake within the left ovary.  This has an SUV max equal to 13.1, image 217.  Skeleton:  No focal hypermetabolic  activity to suggest skeletal metastasis.  IMPRESSION:  1.  Asymmetric increased uptake associated with the right breast and right breast skin thickening corresponding to biopsy-proven invasive breast cancer. 2.  Enlarged and hypermetabolic right axillary lymph nodes consistent with metastatic adenopathy. 3.  No evidence for distant metastatic disease. 4.  Focal area of increased uptake within the left axilla.  The patient is noted to have a corpus luteal cyst are not diagnostic CT images.  This activity is favored to represent physiologic activity.  Consider confirmatory imaging with pelvic sonogram.   Original Report Authenticated By: Signa Kell, M.D.   Ir Fluoro Guide Cv Line Left  01/15/2013   *RADIOLOGY REPORT*  Clinical Data/Indication: Breast cancer  TUNNEL POWER PORT PLACEMENT WITH SUBCUTANEOUS POCKET UTILIZING ULTRASOUND & FLOUROSCOPY  Sedation: Versed 5.0 mg, Fentanyl 250 mcg.  Total Moderate Sedation Time: 35 minutes.  As antibiotic prophylaxis, Ancef  was ordered pre-procedure and administered intravenously within one hour of incision.  Fluoroscopy Time: 36 seconds.  Procedure:  After written informed consent was obtained, patient was placed in the supine position on angiographic table. The left neck and chest was prepped and draped in a sterile fashion. Lidocaine was utilized for local anesthesia.  The left internal jugular vein was noted to be patent initially with ultrasound. Under sonographic guidance, a micropuncture needle was inserted into the left IJ vein (Ultrasound and fluoroscopic image documentation was performed). The needle was removed over an 018 wire which was exchanged for a Amplatz.  This was advanced into  the IVC.  An 8-French dilator was advanced over the Amplatz.  A small incision was made in the left upper chest over the anterior left second rib.  Utilizing blunt dissection, a subcutaneous pocket was created in the caudal direction. The pocket was irrigated with a copious amount  of sterile normal saline.  The port catheter was tunneled from the chest incision, and out the neck incision.  The reservoir was inserted into the subcutaneous pocket and secured with two 3-0 Ethilon stitches.  A peel-away sheath was advanced over the Amplatz wire.  The port catheter was cut to measure length and inserted through the peel-away sheath.  The peel-away sheath was removed.  The chest incision was closed with 3-0 Vicryl interrupted stitches for the subcutaneous tissue and a running of 4- 0 Vicryl subcuticular stitch for the skin.  The neck incision was closed with a 4-0 Vicryl subcuticular stitch.  Derma-bond was applied to both surgical incisions.  The port reservoir was flushed and instilled with heparinized saline.  No complications.  Findings:  A left IJ vein Port-A-Cath is in place with its tip at the cavoatrial junction.  IMPRESSION: Successful 8 French left internal jugular vein power port placement with its tip at the SVC/RA junction.   Original Report Authenticated By: Jolaine Click, M.D.   Ir US Guide Vasc Access Left  01/15/2013   *RADIOLOGY REPORT*  Clinical Data/Indication: Breast cancer  TUNNEL POWER PORT PLACEMENT WITH SUBCUTANEOUS POCKET UTILIZING ULTRASOUND & FLOUROSCOPY  Sedation: Versed 5.0 mg, Fentanyl 250 mcg.  Total Moderate Sedation Time: 35 minutes.  As antibiotic prophylaxis, Ancef  was ordered pre-procedure and administered intravenously within one hour of incision.  Fluoroscopy Time: 36 seconds.  Procedure:  After written informed consent was obtained, patient was placed in the supine position on angiographic table. The left neck and chest was prepped and draped in a sterile fashion. Lidocaine was utilized for local anesthesia.  The left internal jugular vein was noted to be patent initially with ultrasound. Under sonographic guidance, a micropuncture needle was inserted into the left IJ vein (Ultrasound and fluoroscopic image documentation was performed). The needle was  removed over an 018 wire which was exchanged for a Amplatz.  This was advanced into the IVC.  An 8-French dilator was advanced over the Amplatz.  A small incision was made in the left upper chest over the anterior left second rib.  Utilizing blunt dissection, a subcutaneous pocket was created in the caudal direction. The pocket was irrigated with a copious amount of sterile normal saline.  The port catheter was tunneled from the chest incision, and out the neck incision.  The reservoir was inserted into the subcutaneous pocket and secured with two 3-0 Ethilon stitches.  A peel-away sheath was advanced over the Amplatz wire.  The port catheter was cut to measure length and inserted through the peel-away sheath.  The peel-away sheath was removed.  The chest incision was closed with 3-0 Vicryl interrupted stitches for the subcutaneous tissue and a running of 4- 0 Vicryl subcuticular stitch for the skin.  The neck incision was closed with a 4-0 Vicryl subcuticular stitch.  Derma-bond was applied to both surgical incisions.  The port reservoir was flushed and instilled with heparinized saline.  No complications.  Findings:  A left IJ vein Port-A-Cath is in place with its tip at the cavoatrial junction.  IMPRESSION: Successful 8 French left internal jugular vein power port placement with its tip at the SVC/RA junction.   Original Report  Authenticated By: Jolaine Click, M.D.   Mm Digital Diagnostic Unilat L  01/09/2013   *RADIOLOGY REPORT*  Clinical Data:  Evaluate clip placement following two separate ultrasound-guided left breast biopsies.  DIGITAL DIAGNOSTIC LEFT MAMMOGRAM  Comparison:  Previous exams  Findings:  Films are performed following two separate ultrasound guided biopsies of the left breast - at the 11 o'clock position and 2 o'clock position. The ribbon shaped biopsy clip corresponds to the 11 o'clock mass and the T shaped biopsy clip corresponds to the 2 o'clock mass. No immediate complications are  identified.  IMPRESSION: Satisfactory clip placement following ultrasound guided left breast biopsies.  Pathology will be followed.   Original Report Authenticated By: Harmon Pier, M.D.   Mm Digital Diagnostic Unilat L  01/09/2013   *RADIOLOGY REPORT*  Clinical Data:  47 year old female with newly diagnosed right breast cancer.  Left mammogram to compare to presurgical MRI.  DIGITAL DIAGNOSTIC LEFT MAMMOGRAM WITH CAD AND LEFT BREAST ULTRASOUND:  Comparison:  06/11/2012 mammogram and 01/09/2013 MRI  Findings:  ACR Breast Density Category 3: The breast tissue is heterogeneously dense.  A possible circumscribed mass in the upper outer left breast is identified. No suspicious calcifications or distortion identified.  Mammographic images were processed with CAD.  On physical exam, no palpable abnormalities identified within the upper left breast.  Ultrasound is performed, showing a 6 x 6 x 8 mm slightly irregular hypoechoic mass at the 11 o'clock position of the left breast 5 cm from the nipple An 11 x 7 x 9 mm slightly irregular hypoechoic mass at the 2 o'clock position of the left breast 4 cm from the nipple is also present. No enlarged or abnormal appearing left axillary lymph nodes are identified.  IMPRESSION: Indeterminate 6 x 6 x 8 mm hypoechoic mass at the 11 o'clock position of the left breast and 11 x 7 x 9 mm hypoechoic mass in the 2 o'clock position of the left breast.  The 11 o'clock position mass probably represents the enhancing nodule identified on today's MRI.  Tissue sampling of both masses is recommended.  BI-RADS CATEGORY 4:  Suspicious abnormality - biopsy should be considered.  RECOMMENDATION: Ultrasound guided biopsies of both left breast masses.  The findings and recommendations were discussed with the patient and she desires to proceed with ultrasound guided left breast biopsies. These biopsies will be performed today but dictated in a separate report.  Results were also provided in writing at  the conclusion of the visit.  If applicable, a reminder letter will be sent to the patient regarding the next appointment.   Original Report Authenticated By: Harmon Pier, M.D.   [place] Lt Breast Bx W Loc Dev 1st Lesion Img Bx Spec US Guide  01/12/2013   *RADIOLOGY REPORT*  Clinical Data:  47 year old female with newly diagnosed right breast cancer.  Two left breast masses identified sonographically - 6 x 8 mm at the 11 o'clock position and 9 x 11 mm at the 2 o'clock position. For tissue sampling of the 6 x 8 mm mass at the 11 o'clock position.  ULTRASOUND GUIDED VACUUM ASSISTED CORE BIOPSY OF THE LEFT BREAST  Comparison: Previous exams.  I met with the patient and we discussed the procedure of ultrasound- guided biopsy, including benefits and alternatives.  We discussed the high likelihood of a successful procedure. We discussed the risks of the procedure including infection, bleeding, tissue injury, clip migration, and inadequate sampling.  Informed written consent was given.  Using sterile technique and 2%  Lidocaine as local anesthetic, under direct ultrasound visualization, a12 gauge vacuum-assisteddevice was used to perform biopsy of the 6 x 8 mm hypoechoic mass at the 11 o'clock position of the left breast 5 cm from the nipple using a medial approach. At the conclusion of the procedure, a ribbon shaped tissue marker clip was deployed into the biopsy cavity. Follow-up 2-view mammogram was performed and dictated separately.  The usual time-out protocol was performed immediately prior to the procedure.  IMPRESSION: Ultrasound-guided biopsy of 6 x 8 mm hypoechoic mass at the 11 o'clock position of the left breast.  No apparent complications.  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  Histology correlates with imaging findings.  The patient was contacted by phone on 01/12/2013 and these results given to her which she understood. Her questions were answered.  The patient had no complaints with her biopsy site.  Recommend treatment plan.   Original Report Authenticated By: Harmon Pier, M.D.   [place] Lt Breast Bx W Loc Dev Ea Add Lesion Img Bx Spec US Guide  01/12/2013   *RADIOLOGY REPORT*  Clinical Data:  47 year old female with newly diagnosed right breast cancer.  Two left breast masses identified sonographically - 6 x 8 mm at the 11 o'clock position and 9 x 11 mm at the 2 o'clock position. For tissue sampling of the 9 x 11 mm mass at the 2 o'clock position.  ULTRASOUND GUIDED VACUUM ASSISTED CORE BIOPSY OF THE LEFT BREAST  Comparison: Previous exams.  I met with the patient and we discussed the procedure of ultrasound- guided biopsy, including benefits and alternatives.  We discussed the high likelihood of a successful procedure. We discussed the risks of the procedure including infection, bleeding, tissue injury, clip migration, and inadequate sampling.  Informed written consent was given.  Using sterile technique and 2% Lidocaine as local anesthetic, under direct ultrasound visualization, a 12 gauge vacuum-assisteddevice was used to perform biopsy of the 9 x 11 mm hypoechoic mass at the 2 o'clock position of the left breast 4 cm from the nipple using a medial approach. At the conclusion of the procedure, a  tissue marker clip was deployed into the biopsy cavity.  Follow-up 2-view mammogram was performed and dictated separately.  The usual time-out protocol was performed immediately prior to the procedure.  IMPRESSION: Ultrasound-guided biopsy of 9 x 11 mm mass in the 2 o'clock position of the left breast.  No apparent complications.  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 Histology correlates with imaging findings.  The patient was contacted by phone on 01/12/2013 and these results given to her which she understood. Her questions were answered. The patient had no complaints with her biopsy site.  Recommend  treatment plan.   Original Report Authenticated By: Harmon Pier, M.D.    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 Patient is receiving neoadjuvant chemotherapy and her to therapy. She is receiving double HER-2 therapy with Herceptin and perjeta. She receives chemotherapy consisting of Taxotere and carboplatinum. All drugs are given every 3 weeks with day 2 Neulasta. She began her treatment on 01/16/2013. Thus far she is tolerating it well.  #2 patient is set up to see the genetic counselor in the next few weeks since she does have  unde breast cancer under the age of 25.  We again today discussed the rationale for referring her to genetic counseling. 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   PLAN:   #1 proceed with scheduled cycle 2 of chemotherapy.  #2 patient will return in one week's time for lab in followup visit.  #3 she knows to call me with any problems.   All questions were answered. The patient knows to call the clinic with any problems, questions or concerns. We can certainly see the patient much sooner if necessary.  I spent 25 minutes counseling the patient face to face. The total time spent in the appointment was 30 minutes.    Drue Second, MD Medical/Oncology [facility] [phone] (beeper) [phone] (Office)  02/06/2013, 3:59 PM

## 2013-02-06 NOTE — Patient Instructions (Signed)
[facility] Discharge Instructions for Patients Receiving Chemotherapy  Today you received the following chemotherapy agents :  Herceptin, Perjeta, Taxotere, Carboplatin.  To help prevent nausea and vomiting after your treatment, we encourage you to take your nausea medication as instructed by your physician.   If you develop nausea and vomiting that is not controlled by your nausea medication, call the clinic.   BELOW ARE SYMPTOMS THAT SHOULD BE REPORTED IMMEDIATELY:  *FEVER GREATER THAN 100.5 F  *CHILLS WITH OR WITHOUT FEVER  NAUSEA AND VOMITING THAT IS NOT CONTROLLED WITH YOUR NAUSEA MEDICATION  *UNUSUAL SHORTNESS OF BREATH  *UNUSUAL BRUISING OR BLEEDING  TENDERNESS IN MOUTH AND THROAT WITH OR WITHOUT PRESENCE OF ULCERS  *URINARY PROBLEMS  *BOWEL PROBLEMS  UNUSUAL RASH Items with * indicate a potential emergency and should be followed up as soon as possible.  Feel free to call the clinic you have any questions or concerns. The clinic phone number is [phone].    

## 2013-02-06 NOTE — Patient Instructions (Addendum)
Proceed with #2 of TCH/perjeta  I will see you back in 1 week

## 2013-02-07 ENCOUNTER — Ambulatory Visit (HOSPITAL_BASED_OUTPATIENT_CLINIC_OR_DEPARTMENT_OTHER): Payer: BC Managed Care – PPO

## 2013-02-07 VITALS — BP 115/81 | HR 84 | Temp 98.4°F

## 2013-02-07 DIAGNOSIS — C773 Secondary and unspecified malignant neoplasm of axilla and upper limb lymph nodes: Secondary | ICD-10-CM

## 2013-02-07 DIAGNOSIS — C50419 Malignant neoplasm of upper-outer quadrant of unspecified female breast: Secondary | ICD-10-CM

## 2013-02-07 DIAGNOSIS — Z5189 Encounter for other specified aftercare: Secondary | ICD-10-CM

## 2013-02-07 MED ORDER — PEGFILGRASTIM INJECTION 6 MG/0.6ML
6.0000 mg | Freq: Once | SUBCUTANEOUS | Status: AC
  Administered 2013-02-07: 6 mg via SUBCUTANEOUS

## 2013-02-08 NOTE — Progress Notes (Signed)
OFFICE PROGRESS NOTE  CC  [name], MD [address] [place] [address] Dr. Lurline Hare  Dr. 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 At that time she had mammogram performed and it was negative. However her breast continue to increase in size. Initially patient felt that it could have been cysts or something on and therefore she did not go to a physician. However subsequently patient's breast became quite painful and she also began to feel in knot under her right arm.because of this she went to her physician who did a physical exam and a mammogram was performed on 01/01/2013 the mammogram showed diffuse skin thickening generalized increased density within the right breast and enlarged right axillary lymph nodes. She went on to have an ultrasound of the right breast performed that showed a large spiculated hypoechoic mass in the right breast 11:00 2 cm from the nipple measuring at least 4.5 cm in size. There were abnormal lymph nodes with thickened cortex and the right axilla the largest one measuring 3.1 cm in dimension  #2 Patient was recommended ultrasound guided core biopsy of the right breast mass and the right axilla lymph node. The biopsy was performed on 01/01/2013. The right needle core biopsy of the primary mass showed invasive mammary carcinoma grade 2-3 was invasive ductal. The lymph node of the right axilla was positive for metastatic disease. The tumor was ER PR negative HER-2/neu positive with a Ki-67 of 76%. On 01/09/2013 patient had MRI of the breasts performed that showed diffuse right breast skin thickening and edema with right axillary lymph node. There was a 5 mm internal mammary lymph node that was  incompletely imaged. The right breast mass measured 8 x 5 x 4.7 cm. There were abnormal areas involving all 4 quadrants. There was however no involvement of the pectoralis minor for lower chest wall. The left breast showed 2 areas of nodular enhancement these were biopsied and were fibroadenomas.patient was seen by Dr. Claud Kelp and Dr. Lurline Hare  #3 patient will begin neoadjuvant chemotherapy consisting of Taxotere carboplatinum Herceptin/perjeta. She will receive this every 3 weeks with day 2 Neulasta. Total of 6 cycles of chemotherapy is planned. Once she completes this she will have restaging MRI of the breasts performed to evaluate response to therapy. After that she will proceed to her definitive surgery.  #4 patient will have staging studies performed as well. These will be performed next Monday   CURRENT THERAPY: cycle 1 day 1 of TCH/perjeta on 01/16/13  INTERVAL HISTORY: Jasmine Schwartz 47 y.o. female returns for followup visit. She was last seen by me about a week ago. She is now ready to begin her neoadjuvant chemotherapy. Risks and benefits are explained to her today. She has her Port-A-Cath in place she has had chemotherapy teaching class and she also is scheduled for her staging scans next week. Patient today denies any nausea vomiting fevers chills night sweats headaches shortness of breath chest pains palpitations she has no myalgias and arthralgias. Remainder of the 10 point review of systems is negative. She does have tenderness in her breasts.  MEDICAL HISTORY: Past Medical History  Diagnosis Date  . Anxiety   . Depression   . Dyslipidemia   .  Anxiety and depression 07/06/2011  . Breast cancer 01/01/13    /metastatic 1/1 lymph nodes  . Diabetes mellitus     IDDM  . Hx of insertion of insulin pump     ALLERGIES:  has No Known Allergies.  MEDICATIONS:  Current Outpatient Prescriptions  Medication Sig Dispense Refill  . ALPRAZolam (XANAX) 0.25 MG tablet Take 0.25  mg by mouth 3 (three) times daily as needed for sleep or anxiety.      Marland Kitchen dexamethasone (DECADRON) 4 MG tablet Take 8 mg by mouth 2 (two) times daily with a meal. Take two times a day the day before Taxotere. Then take two times a day starting the day after chemo for 3 days.      Marland Kitchen glucose blood (NOVA MAX TEST) test strip Use as directed up to 8 times a day      . Insulin Infusion Pump (MINIMED INSULIN PUMP) DEVI Inject 1 Units/hr into the vein continuous. Pt give extra per cards at meal times. Basic meals are about 5 units      . Insulin Infusion Pump Supplies (MINIMED INFUSION SET-MMT 397) MISC 1 Device by Does not apply route every 3 (three) days.      . Insulin Infusion Pump Supplies (PARADIGM PUMP RESERVOIR 1.76ML) MISC 1 Device by Does not apply route every 3 (three) days.      . insulin lispro (HUMALOG) 100 UNIT/ML injection Inject as directed into Insulin pump, total of 40 units/day      . lidocaine-prilocaine (EMLA) cream Apply topically as needed. Apply to port-a-cath site 1-2 hours before treatment.  30 g  3  . LORazepam (ATIVAN) 0.5 MG tablet Take 0.5 mg by mouth every 6 (six) hours as needed (Nausea or vomiting).      . ondansetron (ZOFRAN) 8 MG tablet Take 8 mg by mouth 2 (two) times daily. Take two times a day starting the day after chemo for 3 days. Then take two times a day as needed for nausea or vomiting.      . Alum & Mag Hydroxide-Simeth (MAGIC MOUTHWASH W/LIDOCAINE) SOLN Take 5 mLs by mouth 4 (four) times daily as needed. Swish and spit.  120 mL  1  . fluconazole (DIFLUCAN) 200 MG tablet Take 1 tablet (200 mg total) by mouth daily.  5 tablet  5  . prochlorperazine (COMPAZINE) 10 MG tablet Take 1 tablet (10 mg total) by mouth every 6 (six) hours as needed (Nausea or vomiting).  30 tablet  1  . prochlorperazine (COMPAZINE) 25 MG suppository Place 25 mg rectally every 12 (twelve) hours as needed for nausea.      . valACYclovir (VALTREX) 500 MG tablet Take 1 tablet (500 mg total) by  mouth 2 (two) times daily.  30 tablet  4   No current facility-administered medications for this visit.    SURGICAL HISTORY:  Past Surgical History  Procedure Laterality Date  . Lumbar spine surgery  2009    Dr Jillyn Hidden  . Cesarean section  1989  . Tubal ligation  1991  . Breast biopsy Right 01/01/13    Invasive mammary ca,metastatic in 1/1 lymph node  . Breast biopsy Left 01/09/13    Fibroadenoma,microcalcifications    REVIEW OF SYSTEMS:  Pertinent items are noted in HPI.   HEALTH MAINTENANCE:  PHYSICAL EXAMINATION: Blood pressure 103/71, pulse 90, temperature 98.2 F (36.8 C), temperature source Oral, resp. rate 20, height 5\' 4"  (1.626 m), weight 145 lb 8 oz (65.998 kg), last menstrual period 01/04/2013.  Body mass index is 24.96 kg/(m^2). ECOG PERFORMANCE STATUS: 1 - Symptomatic but completely ambulatory   General appearance: alert, cooperative and appears stated age Resp: clear to auscultation bilaterally Cardio: regular rate and rhythm GI: soft, non-tender; bowel sounds normal; no masses,  no organomegaly Extremities: extremities normal, atraumatic, no cyanosis or edema Neurologic: Grossly normal   LABORATORY DATA: Lab Results  Component Value Date   WBC 17.6* 02/06/2013   HGB 12.1 02/06/2013   HCT 34.9 02/06/2013   MCV 87.3 02/06/2013   PLT 371 02/06/2013      Chemistry      Component Value Date/Time   NA 138 02/06/2013 1107   NA 140 03/31/2012 0934   K 4.0 02/06/2013 1107   K 4.7 03/31/2012 0934   CL 99 01/23/2013 1251   CL 106 03/31/2012 0934   CO2 25 02/06/2013 1107   CO2 27 03/31/2012 0934   BUN 18.2 02/06/2013 1107   BUN 13 03/31/2012 0934   CREATININE 0.8 02/06/2013 1107   CREATININE 0.6 03/31/2012 0934      Component Value Date/Time   CALCIUM 9.2 02/06/2013 1107   CALCIUM 8.9 03/31/2012 0934   ALKPHOS 57 02/06/2013 1107   ALKPHOS 42 03/31/2012 0934   AST 10 02/06/2013 1107   AST 21 03/31/2012 0934   ALT 15 02/06/2013 1107   ALT 15 03/31/2012 0934   BILITOT 0.69  02/06/2013 1107   BILITOT 0.7 03/31/2012 0934       RADIOGRAPHIC STUDIES:  Ct Chest W Contrast  01/20/2013   *RADIOLOGY REPORT*  Clinical Data:  High risk breast cancer.  Evaluate for metastatic disease.  CT CHEST, ABDOMEN AND PELVIS WITH CONTRAST  Technique:  Multidetector CT imaging of the chest, abdomen and pelvis was performed following the standard protocol during bolus administration of intravenous contrast.  Contrast: OMNIPAQUE IOHEXOL 300 MG/ML  SOLN  Comparison:  None  CT CHEST  Findings:  Lungs/pleura: There is no pleural effusion.  No suspicious pulmonary nodule or mass noted.  Heart/Mediastinum: Heart size is normal.  No pericardial effusion. No mediastinal or hilar adenopathy.  Bones/Musculoskeletal:  Asymmetric increased soft tissue within the right breast parenchyma corresponding to patient's biopsy-proven invasive breast carcinoma.  There is associated overlying skin thickening.  Multiple enlarged right axillary lymph nodes are identified.  Index lymph node measures 1.7 cm, image 23/series 2. Several enhancing, sub centimeter retropectoral lymph nodes are identified.  IMPRESSION:  1.  No acute findings. 2.  Right breast invasive carcinoma with associated right axillary adenopathy.  CT ABDOMEN AND PELVIS  Findings:  There are no focal liver abnormalities identified.  The gallbladder appears normal.  There is no biliary dilatation.  The pancreas is unremarkable.  Normal appearance of the spleen.  The adrenal glands are both normal.  The kidneys are unremarkable. Urinary bladder appears normal.  The uterus is unremarkable. Corpus luteal cyst is noted in the left ovary measuring 1.6 cm.  No free fluid or fluid collections identified within the upper abdomen or pelvis.  The abdominal aorta has a normal caliber. There is no adenopathy identified within the upper abdomen.  No pelvic or inguinal adenopathy noted.  The stomach is normal.  The small bowel loops are unremarkable. Normal appearance  of the colon.  Review of the visualized osseous structures is negative for aggressive lytic or sclerotic bone lesion.  IMPRESSION:  1.  No acute findings within the abdomen or pelvis. 2.  No mass or adenopathy.   Original Report Authenticated By: Ladona Ridgel  Bradly Chris, M.D.   Ct Abdomen Pelvis W Contrast  01/22/2013   *RADIOLOGY REPORT*  Clinical Data:  High risk breast cancer.  Evaluate for metastatic disease.  CT CHEST, ABDOMEN AND PELVIS WITH CONTRAST  Technique:  Multidetector CT imaging of the chest, abdomen and pelvis was performed following the standard protocol during bolus administration of intravenous contrast.  Contrast: OMNIPAQUE IOHEXOL 300 MG/ML  SOLN  Comparison:  None  CT CHEST  Findings:  Lungs/pleura: There is no pleural effusion.  No suspicious pulmonary nodule or mass noted.  Heart/Mediastinum: Heart size is normal.  No pericardial effusion. No mediastinal or hilar adenopathy.  Bones/Musculoskeletal:  Asymmetric increased soft tissue within the right breast parenchyma corresponding to patient's biopsy-proven invasive breast carcinoma.  There is associated overlying skin thickening.  Multiple enlarged right axillary lymph nodes are identified.  Index lymph node measures 1.7 cm, image 23/series 2. Several enhancing, sub centimeter retropectoral lymph nodes are identified.  IMPRESSION:  1.  No acute findings. 2.  Right breast invasive carcinoma with associated right axillary adenopathy.  CT ABDOMEN AND PELVIS  Findings:  There are no focal liver abnormalities identified.  The gallbladder appears normal.  There is no biliary dilatation.  The pancreas is unremarkable.  Normal appearance of the spleen.  The adrenal glands are both normal.  The kidneys are unremarkable. Urinary bladder appears normal.  The uterus is unremarkable. Corpus luteal cyst is noted in the left ovary measuring 1.6 cm.  No free fluid or fluid collections identified within the upper abdomen or pelvis.  The abdominal aorta has a  normal caliber. There is no adenopathy identified within the upper abdomen.  No pelvic or inguinal adenopathy noted.  The stomach is normal.  The small bowel loops are unremarkable. Normal appearance of the colon.  Review of the visualized osseous structures is negative for aggressive lytic or sclerotic bone lesion.  IMPRESSION:  1.  No acute findings within the abdomen or pelvis. 2.  No mass or adenopathy.   Original Report Authenticated By: Signa Kell, M.D.   Nm Pet Image Initial (pi) Skull Base To Thigh  01/20/2013   *RADIOLOGY REPORT*  Clinical Data: Initial treatment strategy for breast cancer.  NUCLEAR MEDICINE PET SKULL BASE TO THIGH  Fasting Blood Glucose:  263  Technique:  19.1 mCi F-18 FDG was injected intravenously. CT data was obtained and used for attenuation correction and anatomic localization only.  (This was not acquired as a diagnostic CT examination.) Additional exam technical data entered on technologist worksheet.  Comparison:  None  Findings:  Neck: No hypermetabolic lymph nodes in the neck.  Chest:  No hypermetabolic mediastinal or hilar nodes.  No suspicious pulmonary nodules on the CT scan. Enlarged right axillary lymph nodes identified.  Index lymph node measures 1.7 cm and has an SUV max equal to 2.8, image 77.  There is asymmetric increased uptake throughout the right breast with associated overlying skin thickening.  This has an SUV max equal to 4.4, image 101.  Abdomen/Pelvis:  No abnormal hypermetabolic activity within the liver, pancreas, adrenal glands, or spleen.  No hypermetabolic lymph nodes in the abdomen or pelvis. There is a small focus of increased FDG uptake within the left ovary.  This has an SUV max equal to 13.1, image 217.  Skeleton:  No focal hypermetabolic activity to suggest skeletal metastasis.  IMPRESSION:  1.  Asymmetric increased uptake associated with the right breast and right breast skin thickening corresponding to biopsy-proven invasive breast cancer. 2.  Enlarged and hypermetabolic right axillary lymph nodes consistent with metastatic adenopathy. 3.  No evidence for distant metastatic disease. 4.  Focal area of increased uptake within the left axilla.  The patient is noted to have a corpus luteal cyst are not diagnostic CT images.  This activity is favored to represent physiologic activity.  Consider confirmatory imaging with pelvic sonogram.   Original Report Authenticated By: Signa Kell, M.D.   Ir Fluoro Guide Cv Line Left  01/15/2013   *RADIOLOGY REPORT*  Clinical Data/Indication: Breast cancer  TUNNEL POWER PORT PLACEMENT WITH SUBCUTANEOUS POCKET UTILIZING ULTRASOUND & FLOUROSCOPY  Sedation: Versed 5.0 mg, Fentanyl 250 mcg.  Total Moderate Sedation Time: 35 minutes.  As antibiotic prophylaxis, Ancef  was ordered pre-procedure and administered intravenously within one hour of incision.  Fluoroscopy Time: 36 seconds.  Procedure:  After written informed consent was obtained, patient was placed in the supine position on angiographic table. The left neck and chest was prepped and draped in a sterile fashion. Lidocaine was utilized for local anesthesia.  The left internal jugular vein was noted to be patent initially with ultrasound. Under sonographic guidance, a micropuncture needle was inserted into the left IJ vein (Ultrasound and fluoroscopic image documentation was performed). The needle was removed over an 018 wire which was exchanged for a Amplatz.  This was advanced into the IVC.  An 8-French dilator was advanced over the Amplatz.  A small incision was made in the left upper chest over the anterior left second rib.  Utilizing blunt dissection, a subcutaneous pocket was created in the caudal direction. The pocket was irrigated with a copious amount of sterile normal saline.  The port catheter was tunneled from the chest incision, and out the neck incision.  The reservoir was inserted into the subcutaneous pocket and secured with two 3-0 Ethilon stitches.   A peel-away sheath was advanced over the Amplatz wire.  The port catheter was cut to measure length and inserted through the peel-away sheath.  The peel-away sheath was removed.  The chest incision was closed with 3-0 Vicryl interrupted stitches for the subcutaneous tissue and a running of 4- 0 Vicryl subcuticular stitch for the skin.  The neck incision was closed with a 4-0 Vicryl subcuticular stitch.  Derma-bond was applied to both surgical incisions.  The port reservoir was flushed and instilled with heparinized saline.  No complications.  Findings:  A left IJ vein Port-A-Cath is in place with its tip at the cavoatrial junction.  IMPRESSION: Successful 8 French left internal jugular vein power port placement with its tip at the SVC/RA junction.   Original Report Authenticated By: Jolaine Click, M.D.   Ir US Guide Vasc Access Left  01/15/2013   *RADIOLOGY REPORT*  Clinical Data/Indication: Breast cancer  TUNNEL POWER PORT PLACEMENT WITH SUBCUTANEOUS POCKET UTILIZING ULTRASOUND & FLOUROSCOPY  Sedation: Versed 5.0 mg, Fentanyl 250 mcg.  Total Moderate Sedation Time: 35 minutes.  As antibiotic prophylaxis, Ancef  was ordered pre-procedure and administered intravenously within one hour of incision.  Fluoroscopy Time: 36 seconds.  Procedure:  After written informed consent was obtained, patient was placed in the supine position on angiographic table. The left neck and chest was prepped and draped in a sterile fashion. Lidocaine was utilized for local anesthesia.  The left internal jugular vein was noted to be patent initially with ultrasound. Under sonographic guidance, a micropuncture needle was inserted into the left IJ vein (Ultrasound and fluoroscopic image documentation was performed). The needle was removed over an 018  wire which was exchanged for a Amplatz.  This was advanced into the IVC.  An 8-French dilator was advanced over the Amplatz.  A small incision was made in the left upper chest over the anterior  left second rib.  Utilizing blunt dissection, a subcutaneous pocket was created in the caudal direction. The pocket was irrigated with a copious amount of sterile normal saline.  The port catheter was tunneled from the chest incision, and out the neck incision.  The reservoir was inserted into the subcutaneous pocket and secured with two 3-0 Ethilon stitches.  A peel-away sheath was advanced over the Amplatz wire.  The port catheter was cut to measure length and inserted through the peel-away sheath.  The peel-away sheath was removed.  The chest incision was closed with 3-0 Vicryl interrupted stitches for the subcutaneous tissue and a running of 4- 0 Vicryl subcuticular stitch for the skin.  The neck incision was closed with a 4-0 Vicryl subcuticular stitch.  Derma-bond was applied to both surgical incisions.  The port reservoir was flushed and instilled with heparinized saline.  No complications.  Findings:  A left IJ vein Port-A-Cath is in place with its tip at the cavoatrial junction.  IMPRESSION: Successful 8 French left internal jugular vein power port placement with its tip at the SVC/RA junction.   Original Report Authenticated By: Jolaine Click, M.D.    ASSESSMENT: 47 year old female with  #1 new diagnosis of 8.0 cm ER negative PR negative HER-2/neu positive invasive ductal carcinoma of the right breast with a Ki-67 of 76%. Patient is now receiving neoadjuvant chemotherapy to help shrink the right breast mass. She will most likely need a mastectomy with axillary lymph node dissection.  #2 we discussed neoadjuvant chemotherapy with to anti-HER-2 therapies including Herceptin and perjeta along with Taxotere carboplatinum. All 4 drugs will be given every 3 weeks. She will require day 2 Neulasta. She has all of her anti-emetics.  #3 she'll be seen by cardiology in the CHF clinic for ongoing surveillance because she is at risk for heart failure secondary to the anti-HER-2 therapy.   PLAN:   #1 proceed  with cycle 1 of her therapy on 6/6  #2 she will return 6/7 for Neulasta injection.  #3 I will see her back in one week's time for followup.   All questions were answered. The patient knows to call the clinic with any problems, questions or concerns. We can certainly see the patient much sooner if necessary.  I spent 25 minutes counseling the patient face to face. The total time spent in the appointment was 30 minutes.    Drue Second, MD Medical/Oncology [facility] [phone] (beeper) [phone] (Office)  02/08/2013, 6:49 PM

## 2013-02-10 ENCOUNTER — Encounter (INDEPENDENT_AMBULATORY_CARE_PROVIDER_SITE_OTHER): Payer: Self-pay | Admitting: General Surgery

## 2013-02-10 ENCOUNTER — Ambulatory Visit (INDEPENDENT_AMBULATORY_CARE_PROVIDER_SITE_OTHER): Payer: BC Managed Care – PPO | Admitting: General Surgery

## 2013-02-10 VITALS — BP 114/78 | HR 100 | Resp 14 | Ht 64.0 in | Wt 136.2 lb

## 2013-02-10 DIAGNOSIS — C50911 Malignant neoplasm of unspecified site of right female breast: Secondary | ICD-10-CM

## 2013-02-10 DIAGNOSIS — C50919 Malignant neoplasm of unspecified site of unspecified female breast: Secondary | ICD-10-CM

## 2013-02-10 NOTE — Progress Notes (Signed)
Patient ID: Jasmine Schwartz, female   DOB: [birth year], 47 y.o.   MRN: [id number] History: This 47 year old female was referred to me by Dr. [name] for a very large discolored right breast mass, and she was initially seen on 12/31/2012. Check clinically positive lymph nodes. 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 There was metastatic cancer in the lymph nodes. The tumor was ER and PR negative but her to positive. She had 2 small densities in the left breast on MRI. They have been biopsied and showed fibroadenomas. PET CT showed no systemic metastasis.  Port-A-Cath has been inserted and she is now had 2 rounds of Herceptin-based chemotherapy. She says there has been a dramatic improvement in the right breast. Shevolunteered that she  is beginning to think about whether she wants bilateral mastectomies or not. She has an appointment with genetic counseling on July 24. She states that her chemotherapy should be completed on or about September 19.  ROS: 10 system review of systems is negative except as described above  Exam: Patient is alert. No distress. Husband is with her. Neck no adenopathy or mass Right breast reveals dramatic response. Discoloration has resolved. The breast is much smaller. There is a little bit of lumpiness centrally and in the upper outer quadrant, possibly residual mass. Axillary nodes now is nonpalpable. Left breast now was actually a little bit larger than the right. No palpable mass there. 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 She is  much more upbeat now she's had an excellent response. She understands that she will undergo another MRI in late September after they completed her cytotoxic chemotherapy and that we will decide about definitive breast surgery thereafter. She knows that she will receive  Herceptin-based chemotherapy for one year Genetic counseling scheduled for July 24 I will see her in early September to go over surgical issues in detail, and I will see her again after the MRI for final planning Offered to send her to a plastic surgeon at any time and she is going to think about this and let me know. She knows that she will need to have definitive surgery, radiation therapy, recover from that, and then at a later time have reconstruction if that is her desire.   Angelia Mould. Derrell Lolling, M.D., [facility] General and Minimally invasive Surgery Breast and Colorectal Surgery Office:   [phone] Pager:   [phone]

## 2013-02-10 NOTE — Patient Instructions (Signed)
You are having a dramatically good response to your chemotherapy. I very encouraged.  We talked about eventual reconstruction and whether that was of interest to you. I think that you should complete your chemotherapy, proceed with your definitive breast surgery in  early October, recover from that and have your radiation therapy. Reconstruction should be done later on after you have recovered from all of those treatments. Let me know if you would like to be referred to a plastic surgeon now or whether he would like to do that later.  Return to see Dr. Derrell Lolling in early September for further discussion.  We will plan an end-of- treatment brest MRI later on in the end of September and hopefully go ahead with surgery in early October.

## 2013-02-12 ENCOUNTER — Other Ambulatory Visit (HOSPITAL_BASED_OUTPATIENT_CLINIC_OR_DEPARTMENT_OTHER): Payer: BC Managed Care – PPO | Admitting: Lab

## 2013-02-12 ENCOUNTER — Ambulatory Visit (HOSPITAL_BASED_OUTPATIENT_CLINIC_OR_DEPARTMENT_OTHER): Payer: BC Managed Care – PPO | Admitting: Oncology

## 2013-02-12 ENCOUNTER — Encounter: Payer: Self-pay | Admitting: Oncology

## 2013-02-12 VITALS — BP 108/75 | HR 106 | Temp 97.5°F | Resp 20 | Ht 64.0 in | Wt 136.5 lb

## 2013-02-12 DIAGNOSIS — C50911 Malignant neoplasm of unspecified site of right female breast: Secondary | ICD-10-CM

## 2013-02-12 DIAGNOSIS — C50419 Malignant neoplasm of upper-outer quadrant of unspecified female breast: Secondary | ICD-10-CM

## 2013-02-12 LAB — CBC WITH DIFFERENTIAL/PLATELET
BASO%: 0.2 % (ref 0.0–2.0)
HCT: 40.1 % (ref 34.8–46.6)
LYMPH%: 41.8 % (ref 14.0–49.7)
MCH: 30.2 pg (ref 25.1–34.0)
MCHC: 34.7 g/dL (ref 31.5–36.0)
MCV: 87.2 fL (ref 79.5–101.0)
MONO#: 0.5 10*3/uL (ref 0.1–0.9)
NEUT%: 47.8 % (ref 38.4–76.8)
Platelets: 162 10*3/uL (ref 145–400)

## 2013-02-12 LAB — COMPREHENSIVE METABOLIC PANEL (CC13)
ALT: 18 U/L (ref 0–55)
CO2: 28 mEq/L (ref 22–29)
Creatinine: 0.8 mg/dL (ref 0.6–1.1)
Total Bilirubin: 0.82 mg/dL (ref 0.20–1.20)

## 2013-02-12 NOTE — Progress Notes (Signed)
OFFICE PROGRESS NOTE  CC  Jasmine Belling, MD [address] [place] [address] Dr. Lurline Hare  Dr. 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 At that time she had mammogram performed and it was negative. However her breast continue to increase in size. Initially patient felt that it could have been cysts or something on and therefore she did not go to a physician. However subsequently patient's breast became quite painful and she also began to feel in knot under her right arm.because of this she went to her physician who did a physical exam and a mammogram was performed on 01/01/2013 the mammogram showed diffuse skin thickening generalized increased density within the right breast and enlarged right axillary lymph nodes. She went on to have an ultrasound of the right breast performed that showed a large spiculated hypoechoic mass in the right breast 11:00 2 cm from the nipple measuring at least 4.5 cm in size. There were abnormal lymph nodes with thickened cortex and the right axilla the largest one measuring 3.1 cm in dimension.  #2Patient was recommended ultrasound guided core biopsy of the right breast mass and the right axilla lymph node. The biopsy was performed on 01/01/2013. The right needle core biopsy of the primary mass showed invasive mammary carcinoma grade 2-3 was invasive ductal. The lymph node of the right axilla was positive for metastatic disease. The tumor was ER PR negative HER-2/neu positive with a Ki-67 of 76%. On 01/09/2013 patient had MRI of the breasts performed that showed diffuse right breast skin thickening and edema with right axillary lymph node. There was a 5 mm internal mammary lymph node that  was incompletely imaged. The right breast mass measured 8 x 5 x 4.7 cm. There were abnormal areas involving all 4 quadrants. There was however no involvement of the pectoralis minor for lower chest wall. The left breast showed 2 areas of nodular enhancement these were biopsied and were fibroadenomas  #3 it was recommended that patient begin neoadjuvant chemotherapy consisting of Taxotere carboplatinum Herceptin and perjeta. This would be given every 3 weeks with day 2 Neulasta. A total of 6 cycles of therapy were planned. Once she completes the treatment then she would proceed with MRI of the breasts again for evaluation a response to therapy and eventually a mastectomy.  #4 patient began Taxotere carboplatinum Herceptin and perjeta starting 01/16/2013.  CURRENT THERAPY: Status post Cycle 2 day  of TCH/perjeta with day 2 Neulasta on 02/07/2013  INTERVAL HISTORY: Jasmine Schwartz 47 y.o. female returns for followup visit today overall she is doing well she is without any complaints. Overall she's doing well she tolerated cycle 2 of her chemotherapy very nicely without any problems. Her blood work looks Community education officer. She denies any nausea vomiting fevers chills night sweats headaches shortness of breath chest pains or palpitations no peripheral paresthesias. No myalgias and arthralgias. Remainder of the 10 point review of systems is negative.  MEDICAL HISTORY: Past Medical History  Diagnosis Date  . Anxiety   . Depression   . Dyslipidemia   . Anxiety and depression 07/06/2011  . Breast cancer 01/01/13    /metastatic 1/1 lymph nodes  . Diabetes mellitus     IDDM  . Hx  of insertion of insulin pump     ALLERGIES:  has No Known Allergies.  MEDICATIONS:  Current Outpatient Prescriptions  Medication Sig Dispense Refill  . ALPRAZolam (XANAX) 0.25 MG tablet Take 0.25 mg by mouth 3 (three) times daily as needed for sleep or anxiety.      . fluconazole (DIFLUCAN) 200 MG tablet Take 1 tablet (200 mg  total) by mouth daily.  5 tablet  5  . glucose blood (NOVA MAX TEST) test strip Use as directed up to 8 times a day      . Insulin Infusion Pump (MINIMED INSULIN PUMP) DEVI Inject 1 Units/hr into the vein continuous. Pt give extra per cards at meal times. Basic meals are about 5 units      . Insulin Infusion Pump Supplies (MINIMED INFUSION SET-MMT 397) MISC 1 Device by Does not apply route every 3 (three) days.      . Insulin Infusion Pump Supplies (PARADIGM PUMP RESERVOIR 1.76ML) MISC 1 Device by Does not apply route every 3 (three) days.      . insulin lispro (HUMALOG) 100 UNIT/ML injection Inject as directed into Insulin pump, total of 40 units/day      . LORazepam (ATIVAN) 0.5 MG tablet Take 0.5 mg by mouth every 6 (six) hours as needed (Nausea or vomiting).      . ondansetron (ZOFRAN) 8 MG tablet Take 8 mg by mouth 2 (two) times daily. Take two times a day starting the day after chemo for 3 days. Then take two times a day as needed for nausea or vomiting.      . Alum & Mag Hydroxide-Simeth (MAGIC MOUTHWASH W/LIDOCAINE) SOLN Take 5 mLs by mouth 4 (four) times daily as needed. Swish and spit.  120 mL  1  . dexamethasone (DECADRON) 4 MG tablet Take 8 mg by mouth 2 (two) times daily with a meal. Take two times a day the day before Taxotere. Then take two times a day starting the day after chemo for 3 days.      Marland Kitchen lidocaine-prilocaine (EMLA) cream Apply topically as needed. Apply to port-a-cath site 1-2 hours before treatment.  30 g  3  . prochlorperazine (COMPAZINE) 10 MG tablet Take 1 tablet (10 mg total) by mouth every 6 (six) hours as needed (Nausea or vomiting).  30 tablet  1  . prochlorperazine (COMPAZINE) 25 MG suppository Place 25 mg rectally every 12 (twelve) hours as needed for nausea.      . valACYclovir (VALTREX) 500 MG tablet Take 1 tablet (500 mg total) by mouth 2 (two) times daily.  30 tablet  4   No current facility-administered medications for this visit.    SURGICAL HISTORY:   Past Surgical History  Procedure Laterality Date  . Lumbar spine surgery  2009    Dr Jillyn Hidden  . Cesarean section  1989  . Tubal ligation  1991  . Breast biopsy Right 01/01/13    Invasive mammary ca,metastatic in 1/1 lymph node  . Breast biopsy Left 01/09/13    Fibroadenoma,microcalcifications    REVIEW OF SYSTEMS:  Pertinent items are noted in HPI.   HEALTH MAINTENANCE:  PHYSICAL EXAMINATION: Blood pressure 108/75, pulse 106, temperature 97.5 F (36.4 C), temperature source Oral, resp. rate 20, height 5\' 4"  (1.626 m), weight 136 lb 8 oz (61.916 kg), last menstrual period 01/04/2013. Body mass index is 23.42 kg/(m^2). ECOG PERFORMANCE STATUS: 0 - Asymptomatic   General appearance: alert, cooperative and appears stated age Resp: clear to auscultation bilaterally Cardio:  regular rate and rhythm GI: soft, non-tender; bowel sounds normal; no masses,  no organomegaly Extremities: extremities normal, atraumatic, no cyanosis or edema Neurologic: Grossly normal Right breast reveals a large palpable mass without any nipple discharge it is softer Left breast no masses or nipple discharge  LABORATORY DATA: Lab Results  Component Value Date   WBC 5.3 02/12/2013   HGB 13.9 02/12/2013   HCT 40.1 02/12/2013   MCV 87.2 02/12/2013   PLT 162 02/12/2013      Chemistry      Component Value Date/Time   NA 133* 02/12/2013 0806   NA 140 03/31/2012 0934   K 4.2 02/12/2013 0806   K 4.7 03/31/2012 0934   CL 99 01/23/2013 1251   CL 106 03/31/2012 0934   CO2 28 02/12/2013 0806   CO2 27 03/31/2012 0934   BUN 14.0 02/12/2013 0806   BUN 13 03/31/2012 0934   CREATININE 0.8 02/12/2013 0806   CREATININE 0.6 03/31/2012 0934      Component Value Date/Time   CALCIUM 8.8 02/12/2013 0806   CALCIUM 8.9 03/31/2012 0934   ALKPHOS 87 02/12/2013 0806   ALKPHOS 42 03/31/2012 0934   AST 17 02/12/2013 0806   AST 21 03/31/2012 0934   ALT 18 02/12/2013 0806   ALT 15 03/31/2012 0934   BILITOT 0.82 02/12/2013 0806   BILITOT 0.7 03/31/2012 0934        RADIOGRAPHIC STUDIES:  Ct Chest W Contrast  01/20/2013   *RADIOLOGY REPORT*  Clinical Data:  High risk breast cancer.  Evaluate for metastatic disease.  CT CHEST, ABDOMEN AND PELVIS WITH CONTRAST  Technique:  Multidetector CT imaging of the chest, abdomen and pelvis was performed following the standard protocol during bolus administration of intravenous contrast.  Contrast: OMNIPAQUE IOHEXOL 300 MG/ML  SOLN  Comparison:  None  CT CHEST  Findings:  Lungs/pleura: There is no pleural effusion.  No suspicious pulmonary nodule or mass noted.  Heart/Mediastinum: Heart size is normal.  No pericardial effusion. No mediastinal or hilar adenopathy.  Bones/Musculoskeletal:  Asymmetric increased soft tissue within the right breast parenchyma corresponding to patient's biopsy-proven invasive breast carcinoma.  There is associated overlying skin thickening.  Multiple enlarged right axillary lymph nodes are identified.  Index lymph node measures 1.7 cm, image 23/series 2. Several enhancing, sub centimeter retropectoral lymph nodes are identified.  IMPRESSION:  1.  No acute findings. 2.  Right breast invasive carcinoma with associated right axillary adenopathy.  CT ABDOMEN AND PELVIS  Findings:  There are no focal liver abnormalities identified.  The gallbladder appears normal.  There is no biliary dilatation.  The pancreas is unremarkable.  Normal appearance of the spleen.  The adrenal glands are both normal.  The kidneys are unremarkable. Urinary bladder appears normal.  The uterus is unremarkable. Corpus luteal cyst is noted in the left ovary measuring 1.6 cm.  No free fluid or fluid collections identified within the upper abdomen or pelvis.  The abdominal aorta has a normal caliber. There is no adenopathy identified within the upper abdomen.  No pelvic or inguinal adenopathy noted.  The stomach is normal.  The small bowel loops are unremarkable. Normal appearance of the colon.  Review of the visualized osseous  structures is negative for aggressive lytic or sclerotic bone lesion.  IMPRESSION:  1.  No acute findings within the abdomen or pelvis. 2.  No mass or adenopathy.   Original Report Authenticated By: Signa Kell, M.D.   US Breast Left  01/09/2013   *  RADIOLOGY REPORT*  Clinical Data:  47 year old female with newly diagnosed right breast cancer.  Left mammogram to compare to presurgical MRI.  DIGITAL DIAGNOSTIC LEFT MAMMOGRAM WITH CAD AND LEFT BREAST ULTRASOUND:  Comparison:  06/11/2012 mammogram and 01/09/2013 MRI  Findings:  ACR Breast Density Category 3: The breast tissue is heterogeneously dense.  A possible circumscribed mass in the upper outer left breast is identified. No suspicious calcifications or distortion identified.  Mammographic images were processed with CAD.  On physical exam, no palpable abnormalities identified within the upper left breast.  Ultrasound is performed, showing a 6 x 6 x 8 mm slightly irregular hypoechoic mass at the 11 o'clock position of the left breast 5 cm from the nipple An 11 x 7 x 9 mm slightly irregular hypoechoic mass at the 2 o'clock position of the left breast 4 cm from the nipple is also present. No enlarged or abnormal appearing left axillary lymph nodes are identified.  IMPRESSION: Indeterminate 6 x 6 x 8 mm hypoechoic mass at the 11 o'clock position of the left breast and 11 x 7 x 9 mm hypoechoic mass in the 2 o'clock position of the left breast.  The 11 o'clock position mass probably represents the enhancing nodule identified on today's MRI.  Tissue sampling of both masses is recommended.  BI-RADS CATEGORY 4:  Suspicious abnormality - biopsy should be considered.  RECOMMENDATION: Ultrasound guided biopsies of both left breast masses.  The findings and recommendations were discussed with the patient and she desires to proceed with ultrasound guided left breast biopsies. These biopsies will be performed today but dictated in a separate report.  Results were also  provided in writing at the conclusion of the visit.  If applicable, a reminder letter will be sent to the patient regarding the next appointment.   Original Report Authenticated By: Harmon Pier, M.D.   Ct Abdomen Pelvis W Contrast  01/22/2013   *RADIOLOGY REPORT*  Clinical Data:  High risk breast cancer.  Evaluate for metastatic disease.  CT CHEST, ABDOMEN AND PELVIS WITH CONTRAST  Technique:  Multidetector CT imaging of the chest, abdomen and pelvis was performed following the standard protocol during bolus administration of intravenous contrast.  Contrast: OMNIPAQUE IOHEXOL 300 MG/ML  SOLN  Comparison:  None  CT CHEST  Findings:  Lungs/pleura: There is no pleural effusion.  No suspicious pulmonary nodule or mass noted.  Heart/Mediastinum: Heart size is normal.  No pericardial effusion. No mediastinal or hilar adenopathy.  Bones/Musculoskeletal:  Asymmetric increased soft tissue within the right breast parenchyma corresponding to patient's biopsy-proven invasive breast carcinoma.  There is associated overlying skin thickening.  Multiple enlarged right axillary lymph nodes are identified.  Index lymph node measures 1.7 cm, image 23/series 2. Several enhancing, sub centimeter retropectoral lymph nodes are identified.  IMPRESSION:  1.  No acute findings. 2.  Right breast invasive carcinoma with associated right axillary adenopathy.  CT ABDOMEN AND PELVIS  Findings:  There are no focal liver abnormalities identified.  The gallbladder appears normal.  There is no biliary dilatation.  The pancreas is unremarkable.  Normal appearance of the spleen.  The adrenal glands are both normal.  The kidneys are unremarkable. Urinary bladder appears normal.  The uterus is unremarkable. Corpus luteal cyst is noted in the left ovary measuring 1.6 cm.  No free fluid or fluid collections identified within the upper abdomen or pelvis.  The abdominal aorta has a normal caliber. There is no adenopathy identified within the upper  abdomen.  No pelvic or inguinal adenopathy noted.  The stomach is normal.  The small bowel loops are unremarkable. Normal appearance of the colon.  Review of the visualized osseous structures is negative for aggressive lytic or sclerotic bone lesion.  IMPRESSION:  1.  No acute findings within the abdomen or pelvis. 2.  No mass or adenopathy.   Original Report Authenticated By: Signa Kell, M.D.   Mr Breast Bilateral W Wo Contrast  01/09/2013   *RADIOLOGY REPORT*  Clinical Data: Biopsy-proven right breast inflammatory carcinoma with primary invasive mammary carcinoma and previous biopsy and known right axillary metastatic disease.  Preoperative staging MRI.  BUN and creatinine were obtained on site at [facility] at [address] Results:  BUN 10 mg/dL,  Creatinine 0.9 mg/dL.  BILATERAL BREAST MRI WITH AND WITHOUT CONTRAST  Technique: Multiplanar, multisequence MR images of both breasts were obtained prior to and following the intravenous administration of 13ml of Multihance.  Three dimensional images were evaluated at the independent DynaCad workstation.  Comparison:  Prior mammograms and ultrasound.  The patient underwent diagnostic mammography and ultrasound of the left breast today as no recent prior exam was available after the recent diagnosis in the right breast.  Findings: There is diffuse right breast skin thickening and edema, trabecular edema, and right axillary lymphadenopathy with maximal nodal measurement 3.1 cm short-axis diameter.  There is also a 5 mm internal mammary artery chain lymph node identified image 26 series 2. There is an incompletely visualized lymph node subjacent to the pectoralis minor muscle on the first image of the T2-weighted series.  There is a large irregular enhancing mass in the right breast 11 o'clock location with associated clip artifact measuring maximally 8.0 x 5.6 by 4.7 cm. This demonstrates washin/washout type enhancement kinetics.  This corresponds  to the biopsy-proven invasive mammary carcinoma.  There are abnormal nodular areas of trabecular has been throughout the right breast involving all four quadrants. No abnormal underlying pectoralis enhancement or chest wall involvement is identified.  In the left breast 11 o'clock location, there is an 8 mm enhancing nodule corresponding to that seen at the examination earlier today. This area demonstrates plateau type enhancement kinetics.  No left- sided axillary or internal mammary artery chain lymphadenopathy is identified.  There is mild inhomogeneous nodular enhancement in the left breast two o'clock location at the site of the mass seen in this location earlier today, measuring 9 mm, measuring progressive enhancement kinetics.  IMPRESSION: Large irregular enhancing right breast mass 11 o'clock location corresponding to biopsy-proven invasive mammary carcinoma, with metastatic right axillary lymphadenopathy as well as skin and trabecular thickening/enhancement compatible with the clinical diagnosis of inflammatory and/or locally advanced breast cancer.  5 mm abnormal right internal mammary artery chain lymph node.  8 mm enhancing mass left breast 11 o'clock location and mildly irregular area of mass-like enhancement in the left breast two o'clock location, corresponding to those seen at diagnostic examination earlier today.  Biopsy has been scheduled for today and will be dictated separately.  RECOMMENDATION: Treatment plan  THREE-DIMENSIONAL MR IMAGE RENDERING ON INDEPENDENT WORKSTATION:  Three-dimensional MR images were rendered by post-processing of the original MR data on an independent workstation.  The three- dimensional MR images were interpreted, and findings were reported in the accompanying complete MRI report for this study.  BI-RADS CATEGORY 6:  Known biopsy-proven malignancy - appropriate action should be taken.   Original Report Authenticated By: Christiana Pellant, M.D.   Nm Pet Image Initial (pi)  Skull  Base To Thigh  01/20/2013   *RADIOLOGY REPORT*  Clinical Data: Initial treatment strategy for breast cancer.  NUCLEAR MEDICINE PET SKULL BASE TO THIGH  Fasting Blood Glucose:  263  Technique:  19.1 mCi F-18 FDG was injected intravenously. CT data was obtained and used for attenuation correction and anatomic localization only.  (This was not acquired as a diagnostic CT examination.) Additional exam technical data entered on technologist worksheet.  Comparison:  None  Findings:  Neck: No hypermetabolic lymph nodes in the neck.  Chest:  No hypermetabolic mediastinal or hilar nodes.  No suspicious pulmonary nodules on the CT scan. Enlarged right axillary lymph nodes identified.  Index lymph node measures 1.7 cm and has an SUV max equal to 2.8, image 77.  There is asymmetric increased uptake throughout the right breast with associated overlying skin thickening.  This has an SUV max equal to 4.4, image 101.  Abdomen/Pelvis:  No abnormal hypermetabolic activity within the liver, pancreas, adrenal glands, or spleen.  No hypermetabolic lymph nodes in the abdomen or pelvis. There is a small focus of increased FDG uptake within the left ovary.  This has an SUV max equal to 13.1, image 217.  Skeleton:  No focal hypermetabolic activity to suggest skeletal metastasis.  IMPRESSION:  1.  Asymmetric increased uptake associated with the right breast and right breast skin thickening corresponding to biopsy-proven invasive breast cancer. 2.  Enlarged and hypermetabolic right axillary lymph nodes consistent with metastatic adenopathy. 3.  No evidence for distant metastatic disease. 4.  Focal area of increased uptake within the left axilla.  The patient is noted to have a corpus luteal cyst are not diagnostic CT images.  This activity is favored to represent physiologic activity.  Consider confirmatory imaging with pelvic sonogram.   Original Report Authenticated By: Signa Kell, M.D.   Ir Fluoro Guide Cv Line Left  01/15/2013    *RADIOLOGY REPORT*  Clinical Data/Indication: Breast cancer  TUNNEL POWER PORT PLACEMENT WITH SUBCUTANEOUS POCKET UTILIZING ULTRASOUND & FLOUROSCOPY  Sedation: Versed 5.0 mg, Fentanyl 250 mcg.  Total Moderate Sedation Time: 35 minutes.  As antibiotic prophylaxis, Ancef  was ordered pre-procedure and administered intravenously within one hour of incision.  Fluoroscopy Time: 36 seconds.  Procedure:  After written informed consent was obtained, patient was placed in the supine position on angiographic table. The left neck and chest was prepped and draped in a sterile fashion. Lidocaine was utilized for local anesthesia.  The left internal jugular vein was noted to be patent initially with ultrasound. Under sonographic guidance, a micropuncture needle was inserted into the left IJ vein (Ultrasound and fluoroscopic image documentation was performed). The needle was removed over an 018 wire which was exchanged for a Amplatz.  This was advanced into the IVC.  An 8-French dilator was advanced over the Amplatz.  A small incision was made in the left upper chest over the anterior left second rib.  Utilizing blunt dissection, a subcutaneous pocket was created in the caudal direction. The pocket was irrigated with a copious amount of sterile normal saline.  The port catheter was tunneled from the chest incision, and out the neck incision.  The reservoir was inserted into the subcutaneous pocket and secured with two 3-0 Ethilon stitches.  A peel-away sheath was advanced over the Amplatz wire.  The port catheter was cut to measure length and inserted through the peel-away sheath.  The peel-away sheath was removed.  The chest incision was closed with 3-0 Vicryl interrupted stitches for the  subcutaneous tissue and a running of 4- 0 Vicryl subcuticular stitch for the skin.  The neck incision was closed with a 4-0 Vicryl subcuticular stitch.  Derma-bond was applied to both surgical incisions.  The port reservoir was flushed and  instilled with heparinized saline.  No complications.  Findings:  A left IJ vein Port-A-Cath is in place with its tip at the cavoatrial junction.  IMPRESSION: Successful 8 French left internal jugular vein power port placement with its tip at the SVC/RA junction.   Original Report Authenticated By: Jolaine Click, M.D.   Ir US Guide Vasc Access Left  01/15/2013   *RADIOLOGY REPORT*  Clinical Data/Indication: Breast cancer  TUNNEL POWER PORT PLACEMENT WITH SUBCUTANEOUS POCKET UTILIZING ULTRASOUND & FLOUROSCOPY  Sedation: Versed 5.0 mg, Fentanyl 250 mcg.  Total Moderate Sedation Time: 35 minutes.  As antibiotic prophylaxis, Ancef  was ordered pre-procedure and administered intravenously within one hour of incision.  Fluoroscopy Time: 36 seconds.  Procedure:  After written informed consent was obtained, patient was placed in the supine position on angiographic table. The left neck and chest was prepped and draped in a sterile fashion. Lidocaine was utilized for local anesthesia.  The left internal jugular vein was noted to be patent initially with ultrasound. Under sonographic guidance, a micropuncture needle was inserted into the left IJ vein (Ultrasound and fluoroscopic image documentation was performed). The needle was removed over an 018 wire which was exchanged for a Amplatz.  This was advanced into the IVC.  An 8-French dilator was advanced over the Amplatz.  A small incision was made in the left upper chest over the anterior left second rib.  Utilizing blunt dissection, a subcutaneous pocket was created in the caudal direction. The pocket was irrigated with a copious amount of sterile normal saline.  The port catheter was tunneled from the chest incision, and out the neck incision.  The reservoir was inserted into the subcutaneous pocket and secured with two 3-0 Ethilon stitches.  A peel-away sheath was advanced over the Amplatz wire.  The port catheter was cut to measure length and inserted through the peel-away  sheath.  The peel-away sheath was removed.  The chest incision was closed with 3-0 Vicryl interrupted stitches for the subcutaneous tissue and a running of 4- 0 Vicryl subcuticular stitch for the skin.  The neck incision was closed with a 4-0 Vicryl subcuticular stitch.  Derma-bond was applied to both surgical incisions.  The port reservoir was flushed and instilled with heparinized saline.  No complications.  Findings:  A left IJ vein Port-A-Cath is in place with its tip at the cavoatrial junction.  IMPRESSION: Successful 8 French left internal jugular vein power port placement with its tip at the SVC/RA junction.   Original Report Authenticated By: Jolaine Click, M.D.   Mm Digital Diagnostic Un[facility] L  01/09/2013   *RADIOLOGY REPORT*  Clinical Data:  Evaluate clip placement following two separate ultrasound-guided left breast biopsies.  DIGITAL DIAGNOSTIC LEFT MAMMOGRAM  Comparison:  Previous exams  Findings:  Films are performed following two separate ultrasound guided biopsies of the left breast - at the 11 o'clock position and 2 o'clock position. The ribbon shaped biopsy clip corresponds to the 11 o'clock mass and the T shaped biopsy clip corresponds to the 2 o'clock mass. No immediate complications are identified.  IMPRESSION: Satisfactory clip placement following ultrasound guided left breast biopsies.  Pathology will be followed.   Original Report Authenticated By: Harmon Pier, M.D.   Mm Digital Diagnostic Unilat L  01/09/2013   *RADIOLOGY REPORT*  Clinical Data:  47 year old female with newly diagnosed right breast cancer.  Left mammogram to compare to presurgical MRI.  DIGITAL DIAGNOSTIC LEFT MAMMOGRAM WITH CAD AND LEFT BREAST ULTRASOUND:  Comparison:  06/11/2012 mammogram and 01/09/2013 MRI  Findings:  ACR Breast Density Category 3: The breast tissue is heterogeneously dense.  A possible circumscribed mass in the upper outer left breast is identified. No suspicious calcifications or distortion  identified.  Mammographic images were processed with CAD.  On physical exam, no palpable abnormalities identified within the upper left breast.  Ultrasound is performed, showing a 6 x 6 x 8 mm slightly irregular hypoechoic mass at the 11 o'clock position of the left breast 5 cm from the nipple An 11 x 7 x 9 mm slightly irregular hypoechoic mass at the 2 o'clock position of the left breast 4 cm from the nipple is also present. No enlarged or abnormal appearing left axillary lymph nodes are identified.  IMPRESSION: Indeterminate 6 x 6 x 8 mm hypoechoic mass at the 11 o'clock position of the left breast and 11 x 7 x 9 mm hypoechoic mass in the 2 o'clock position of the left breast.  The 11 o'clock position mass probably represents the enhancing nodule identified on today's MRI.  Tissue sampling of both masses is recommended.  BI-RADS CATEGORY 4:  Suspicious abnormality - biopsy should be considered.  RECOMMENDATION: Ultrasound guided biopsies of both left breast masses.  The findings and recommendations were discussed with the patient and she desires to proceed with ultrasound guided left breast biopsies. These biopsies will be performed today but dictated in a separate report.  Results were also provided in writing at the conclusion of the visit.  If applicable, a reminder letter will be sent to the patient regarding the next appointment.   Original Report Authenticated By: Harmon Pier, M.D.   [place] Lt Breast Bx W Loc Dev 1st Lesion Img Bx Spec US Guide  01/12/2013   *RADIOLOGY REPORT*  Clinical Data:  47 year old female with newly diagnosed right breast cancer.  Two left breast masses identified sonographically - 6 x 8 mm at the 11 o'clock position and 9 x 11 mm at the 2 o'clock position. For tissue sampling of the 6 x 8 mm mass at the 11 o'clock position.  ULTRASOUND GUIDED VACUUM ASSISTED CORE BIOPSY OF THE LEFT BREAST  Comparison: Previous exams.  I met with the patient and we discussed the procedure of ultrasound-  guided biopsy, including benefits and alternatives.  We discussed the high likelihood of a successful procedure. We discussed the risks of the procedure including infection, bleeding, tissue injury, clip migration, and inadequate sampling.  Informed written consent was given.  Using sterile technique and 2% Lidocaine as local anesthetic, under direct ultrasound visualization, a12 gauge vacuum-assisteddevice was used to perform biopsy of the 6 x 8 mm hypoechoic mass at the 11 o'clock position of the left breast 5 cm from the nipple using a medial approach. At the conclusion of the procedure, a ribbon shaped tissue marker clip was deployed into the biopsy cavity. Follow-up 2-view mammogram was performed and dictated separately.  The usual time-out protocol was performed immediately prior to the procedure.  IMPRESSION: Ultrasound-guided biopsy of 6 x 8 mm hypoechoic mass at the 11 o'clock position of the left breast.  No apparent complications.  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  Histology correlates with imaging findings.  The patient was  contacted by phone on 01/12/2013 and these results given to her which she understood. Her questions were answered. The patient had no complaints with her biopsy site.  Recommend treatment plan.   Original Report Authenticated By: Harmon Pier, M.D.   [place] Lt Breast Bx W Loc Dev Ea Add Lesion Img Bx Spec US Guide  01/12/2013   *RADIOLOGY REPORT*  Clinical Data:  47 year old female with newly diagnosed right breast cancer.  Two left breast masses identified sonographically - 6 x 8 mm at the 11 o'clock position and 9 x 11 mm at the 2 o'clock position. For tissue sampling of the 9 x 11 mm mass at the 2 o'clock position.  ULTRASOUND GUIDED VACUUM ASSISTED CORE BIOPSY OF THE LEFT BREAST  Comparison: Previous exams.  I met with the patient and we discussed the procedure of ultrasound- guided biopsy, including benefits and  alternatives.  We discussed the high likelihood of a successful procedure. We discussed the risks of the procedure including infection, bleeding, tissue injury, clip migration, and inadequate sampling.  Informed written consent was given.  Using sterile technique and 2% Lidocaine as local anesthetic, under direct ultrasound visualization, a 12 gauge vacuum-assisteddevice was used to perform biopsy of the 9 x 11 mm hypoechoic mass at the 2 o'clock position of the left breast 4 cm from the nipple using a medial approach. At the conclusion of the procedure, a  tissue marker clip was deployed into the biopsy cavity.  Follow-up 2-view mammogram was performed and dictated separately.  The usual time-out protocol was performed immediately prior to the procedure.  IMPRESSION: Ultrasound-guided biopsy of 9 x 11 mm mass in the 2 o'clock position of the left breast.  No apparent complications.  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 Histology correlates with imaging findings.  The patient was contacted by phone on 01/12/2013 and these results given to her which she understood. Her questions were answered. The patient had no complaints with her biopsy site.  Recommend treatment plan.   Original Report Authenticated By: Harmon Pier, M.D.    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 Patient is receiving neoadjuvant chemotherapy and her to therapy. She is receiving double HER-2 therapy with Herceptin and perjeta. She receives chemotherapy consisting of Taxotere and carboplatinum. All drugs are given every 3 weeks with day 2 Neulasta. She began her treatment on 01/16/2013. Thus far she is tolerating it well.  #2 patient is set up to see the genetic counselor in the next few weeks since she does have  unde breast cancer under the  age of 33. We again today discussed the rationale for referring her to genetic counseling. 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   PLAN:   #1 overall patient is tolerating her chemotherapy very well without any problems. Her counts are remained pretty stable.  #2 she will return in 2 weeks' time for cycle 3 of her TCH/perjeta.  All questions were answered. The patient knows to call the clinic with any problems, questions or concerns. We can certainly see the patient much sooner if necessary.  I spent 15 minutes counseling the patient face to face. The total time spent in the appointment was 30 minutes.  Drue Second, MD Medical/Oncology [facility] [phone] (beeper) [phone] (Office)  02/12/2013, 8:57 AM

## 2013-02-12 NOTE — Patient Instructions (Addendum)
Doing well  We will see you back in 2 weeks  Enjoy 4th of July!!!

## 2013-02-13 NOTE — Progress Notes (Signed)
OFFICE PROGRESS NOTE  CC  [name], MD [address] [place] [address] Dr. Lurline Hare  Dr. 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 At that time she had mammogram performed and it was negative. However her breast continue to increase in size. Initially patient felt that it could have been cysts or something on and therefore she did not go to a physician. However subsequently patient's breast became quite painful and she also began to feel in knot under her right arm.because of this she went to her physician who did a physical exam and a mammogram was performed on 01/01/2013 the mammogram showed diffuse skin thickening generalized increased density within the right breast and enlarged right axillary lymph nodes. She went on to have an ultrasound of the right breast performed that showed a large spiculated hypoechoic mass in the right breast 11:00 2 cm from the nipple measuring at least 4.5 cm in size. There were abnormal lymph nodes with thickened cortex and the right axilla the largest one measuring 3.1 cm in dimension  #2 Patient was recommended ultrasound guided core biopsy of the right breast mass and the right axilla lymph node. The biopsy was performed on 01/01/2013. The right needle core biopsy of the primary mass showed invasive mammary carcinoma grade 2-3 was invasive ductal. The lymph node of the right axilla was positive for metastatic disease. The tumor was ER PR negative HER-2/neu positive with a Ki-67 of 76%. On 01/09/2013 patient had MRI of the breasts performed that showed diffuse right breast skin thickening and edema with right axillary lymph node. There was a 5 mm internal mammary lymph node that was  incompletely imaged. The right breast mass measured 8 x 5 x 4.7 cm. There were abnormal areas involving all 4 quadrants. There was however no involvement of the pectoralis minor for lower chest wall. The left breast showed 2 areas of nodular enhancement these were biopsied and were fibroadenomas.patient was seen by Dr. Claud Kelp and Dr. Lurline Hare  #3 patient will begin neoadjuvant chemotherapy consisting of Taxotere carboplatinum Herceptin/perjeta. She will receive this every 3 weeks with day 2 Neulasta. Total of 6 cycles of chemotherapy is planned. Once she completes this she will have restaging MRI of the breasts performed to evaluate response to therapy. After that she will proceed to her definitive surgery.  #4 PET/CT performed on 01/20/2013 the PET scan showed no hypermetabolic lymph nodes in the neck. Chest showed no hypermetabolic mediastinal hilar nodes no suspicious pulmonary nodules on the CT scan. There was noted to be enlarged right axillary lymph node. Index node measured 1.7 cm with an SUV of 2.8. There was also asymmetric uptake throughout the right breast with associated overlying skin thickening as she be equal to 4.4. The abdomen and pelvis showed no hypermetabolic activity within the liver pancreas adrenal glands or spleen no hypermetabolic lymph nodes in the abdomen or pelvis.  #5 patient received neoadjuvant chemotherapy starting on 01/16/2013 consisting of cycle 1 of TCH/perjeta. She did receive day 2 Neulasta. She will continue this regimen every 21 days for a total of 6 cycles.   CURRENT THERAPY: s/p cycle 1 day 1 of TCH/perjeta on 01/16/13  INTERVAL HISTORY: Jasmine Schwartz 47 y.o. female  returns for followup visit. Clinically she looks good. Overall she tolerated the chemotherapy without any significant problems. Patient has held up her blood counts very nicely her white count is 13.8 hemoglobin and platelets are normal. Patient has developed a used infection and I have  recommended Diflucan. 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 She will begin Valtrex twice a day for 5 days then once a day for prophylaxis against mouth sores. She's looks a little dehydrated and I have recommended that she drink plenty of water to keep herself well hydrated. She however denies any nausea vomiting fevers chills night sweats no shortness of breath no chest pains or palpitations. Remainder of the 10 point review of systems is negative.  MEDICAL HISTORY: Past Medical History  Diagnosis Date  . Anxiety   . Depression   . Dyslipidemia   . Anxiety and depression 07/06/2011  . Breast cancer 01/01/13    /metastatic 1/1 lymph nodes  . Diabetes mellitus     IDDM  . Hx of insertion of insulin pump     ALLERGIES:  has No Known Allergies.  MEDICATIONS:  Current Outpatient Prescriptions  Medication Sig Dispense Refill  . ALPRAZolam (XANAX) 0.25 MG tablet Take 0.25 mg by mouth 3 (three) times daily as needed for sleep or anxiety.      . Alum & Mag Hydroxide-Simeth (MAGIC MOUTHWASH W/LIDOCAINE) SOLN Take 5 mLs by mouth 4 (four) times daily as needed. Swish and spit.  120 mL  1  . glucose blood (NOVA MAX TEST) test strip Use as directed up to 8 times a day      . Insulin Infusion Pump (MINIMED INSULIN PUMP) DEVI Inject 1 Units/hr into the vein continuous. Pt give extra per cards at meal times. Basic meals are about 5 units      . Insulin Infusion Pump Supplies (MINIMED INFUSION SET-MMT 397) MISC 1 Device by Does not apply route every 3 (three) days.      . Insulin Infusion Pump Supplies (PARADIGM PUMP RESERVOIR 1.76ML) MISC 1 Device by Does not apply route every 3 (three) days.      . insulin lispro (HUMALOG) 100 UNIT/ML injection Inject as directed into Insulin pump, total of 40 units/day      . LORazepam (ATIVAN) 0.5 MG tablet Take 0.5 mg by mouth every 6 (six) hours as needed (Nausea or vomiting).      Marland Kitchen dexamethasone (DECADRON)  4 MG tablet Take 8 mg by mouth 2 (two) times daily with a meal. Take two times a day the day before Taxotere. Then take two times a day starting the day after chemo for 3 days.      . fluconazole (DIFLUCAN) 200 MG tablet Take 1 tablet (200 mg total) by mouth daily.  5 tablet  5  . lidocaine-prilocaine (EMLA) cream Apply topically as needed. Apply to port-a-cath site 1-2 hours before treatment.  30 g  3  . ondansetron (ZOFRAN) 8 MG tablet Take 8 mg by mouth 2 (two) times daily. Take two times a day starting the day after chemo for 3 days. Then take two times a day as needed for nausea or vomiting.      . prochlorperazine (COMPAZINE) 10 MG tablet Take 1 tablet (10 mg total) by mouth every 6 (six) hours as needed (Nausea or vomiting).  30 tablet  1  . prochlorperazine (COMPAZINE) 25 MG suppository Place 25 mg rectally every 12 (twelve) hours as needed for  nausea.      . valACYclovir (VALTREX) 500 MG tablet Take 1 tablet (500 mg total) by mouth 2 (two) times daily.  30 tablet  4   No current facility-administered medications for this visit.    SURGICAL HISTORY:  Past Surgical History  Procedure Laterality Date  . Lumbar spine surgery  2009    Dr Jillyn Hidden  . Cesarean section  1989  . Tubal ligation  1991  . Breast biopsy Right 01/01/13    Invasive mammary ca,metastatic in 1/1 lymph node  . Breast biopsy Left 01/09/13    Fibroadenoma,microcalcifications    REVIEW OF SYSTEMS:  Pertinent items are noted in HPI.   HEALTH MAINTENANCE:  PHYSICAL EXAMINATION: Blood pressure 119/78, pulse 101, temperature 98.3 F (36.8 C), temperature source Oral, resp. rate 20, height 5\' 4"  (1.626 m), weight 139 lb 8 oz (63.277 kg), last menstrual period 01/04/2013. Body mass index is 23.93 kg/(m^2). ECOG PERFORMANCE STATUS: 1 - Symptomatic but completely ambulatory   General appearance: alert, cooperative and appears stated age Resp: clear to auscultation bilaterally Cardio: regular rate and rhythm GI: soft,  non-tender; bowel sounds normal; no masses,  no organomegaly Extremities: extremities normal, atraumatic, no cyanosis or edema Neurologic: Grossly normal   LABORATORY DATA: Lab Results  Component Value Date   WBC 5.3 02/12/2013   HGB 13.9 02/12/2013   HCT 40.1 02/12/2013   MCV 87.2 02/12/2013   PLT 162 02/12/2013      Chemistry      Component Value Date/Time   NA 133* 02/12/2013 0806   NA 140 03/31/2012 0934   K 4.2 02/12/2013 0806   K 4.7 03/31/2012 0934   CL 99 01/23/2013 1251   CL 106 03/31/2012 0934   CO2 28 02/12/2013 0806   CO2 27 03/31/2012 0934   BUN 14.0 02/12/2013 0806   BUN 13 03/31/2012 0934   CREATININE 0.8 02/12/2013 0806   CREATININE 0.6 03/31/2012 0934      Component Value Date/Time   CALCIUM 8.8 02/12/2013 0806   CALCIUM 8.9 03/31/2012 0934   ALKPHOS 87 02/12/2013 0806   ALKPHOS 42 03/31/2012 0934   AST 17 02/12/2013 0806   AST 21 03/31/2012 0934   ALT 18 02/12/2013 0806   ALT 15 03/31/2012 0934   BILITOT 0.82 02/12/2013 0806   BILITOT 0.7 03/31/2012 0934       RADIOGRAPHIC STUDIES:  Ct Chest W Contrast  01/20/2013   *RADIOLOGY REPORT*  Clinical Data:  High risk breast cancer.  Evaluate for metastatic disease.  CT CHEST, ABDOMEN AND PELVIS WITH CONTRAST  Technique:  Multidetector CT imaging of the chest, abdomen and pelvis was performed following the standard protocol during bolus administration of intravenous contrast.  Contrast: OMNIPAQUE IOHEXOL 300 MG/ML  SOLN  Comparison:  None  CT CHEST  Findings:  Lungs/pleura: There is no pleural effusion.  No suspicious pulmonary nodule or mass noted.  Heart/Mediastinum: Heart size is normal.  No pericardial effusion. No mediastinal or hilar adenopathy.  Bones/Musculoskeletal:  Asymmetric increased soft tissue within the right breast parenchyma corresponding to patient's biopsy-proven invasive breast carcinoma.  There is associated overlying skin thickening.  Multiple enlarged right axillary lymph nodes are identified.  Index lymph node  measures 1.7 cm, image 23/series 2. Several enhancing, sub centimeter retropectoral lymph nodes are identified.  IMPRESSION:  1.  No acute findings. 2.  Right breast invasive carcinoma with associated right axillary adenopathy.  CT ABDOMEN AND PELVIS  Findings:  There are no focal liver abnormalities  identified.  The gallbladder appears normal.  There is no biliary dilatation.  The pancreas is unremarkable.  Normal appearance of the spleen.  The adrenal glands are both normal.  The kidneys are unremarkable. Urinary bladder appears normal.  The uterus is unremarkable. Corpus luteal cyst is noted in the left ovary measuring 1.6 cm.  No free fluid or fluid collections identified within the upper abdomen or pelvis.  The abdominal aorta has a normal caliber. There is no adenopathy identified within the upper abdomen.  No pelvic or inguinal adenopathy noted.  The stomach is normal.  The small bowel loops are unremarkable. Normal appearance of the colon.  Review of the visualized osseous structures is negative for aggressive lytic or sclerotic bone lesion.  IMPRESSION:  1.  No acute findings within the abdomen or pelvis. 2.  No mass or adenopathy.   Original Report Authenticated By: Signa Kell, M.D.   Ct Abdomen Pelvis W Contrast  01/22/2013   *RADIOLOGY REPORT*  Clinical Data:  High risk breast cancer.  Evaluate for metastatic disease.  CT CHEST, ABDOMEN AND PELVIS WITH CONTRAST  Technique:  Multidetector CT imaging of the chest, abdomen and pelvis was performed following the standard protocol during bolus administration of intravenous contrast.  Contrast: OMNIPAQUE IOHEXOL 300 MG/ML  SOLN  Comparison:  None  CT CHEST  Findings:  Lungs/pleura: There is no pleural effusion.  No suspicious pulmonary nodule or mass noted.  Heart/Mediastinum: Heart size is normal.  No pericardial effusion. No mediastinal or hilar adenopathy.  Bones/Musculoskeletal:  Asymmetric increased soft tissue within the right breast  parenchyma corresponding to patient's biopsy-proven invasive breast carcinoma.  There is associated overlying skin thickening.  Multiple enlarged right axillary lymph nodes are identified.  Index lymph node measures 1.7 cm, image 23/series 2. Several enhancing, sub centimeter retropectoral lymph nodes are identified.  IMPRESSION:  1.  No acute findings. 2.  Right breast invasive carcinoma with associated right axillary adenopathy.  CT ABDOMEN AND PELVIS  Findings:  There are no focal liver abnormalities identified.  The gallbladder appears normal.  There is no biliary dilatation.  The pancreas is unremarkable.  Normal appearance of the spleen.  The adrenal glands are both normal.  The kidneys are unremarkable. Urinary bladder appears normal.  The uterus is unremarkable. Corpus luteal cyst is noted in the left ovary measuring 1.6 cm.  No free fluid or fluid collections identified within the upper abdomen or pelvis.  The abdominal aorta has a normal caliber. There is no adenopathy identified within the upper abdomen.  No pelvic or inguinal adenopathy noted.  The stomach is normal.  The small bowel loops are unremarkable. Normal appearance of the colon.  Review of the visualized osseous structures is negative for aggressive lytic or sclerotic bone lesion.  IMPRESSION:  1.  No acute findings within the abdomen or pelvis. 2.  No mass or adenopathy.   Original Report Authenticated By: Signa Kell, M.D.   Nm Pet Image Initial (pi) Skull Base To Thigh  01/20/2013   *RADIOLOGY REPORT*  Clinical Data: Initial treatment strategy for breast cancer.  NUCLEAR MEDICINE PET SKULL BASE TO THIGH  Fasting Blood Glucose:  263  Technique:  19.1 mCi F-18 FDG was injected intravenously. CT data was obtained and used for attenuation correction and anatomic localization only.  (This was not acquired as a diagnostic CT examination.) Additional exam technical data entered on technologist worksheet.  Comparison:  None  Findings:  Neck: No  hypermetabolic lymph nodes in the  neck.  Chest:  No hypermetabolic mediastinal or hilar nodes.  No suspicious pulmonary nodules on the CT scan. Enlarged right axillary lymph nodes identified.  Index lymph node measures 1.7 cm and has an SUV max equal to 2.8, image 77.  There is asymmetric increased uptake throughout the right breast with associated overlying skin thickening.  This has an SUV max equal to 4.4, image 101.  Abdomen/Pelvis:  No abnormal hypermetabolic activity within the liver, pancreas, adrenal glands, or spleen.  No hypermetabolic lymph nodes in the abdomen or pelvis. There is a small focus of increased FDG uptake within the left ovary.  This has an SUV max equal to 13.1, image 217.  Skeleton:  No focal hypermetabolic activity to suggest skeletal metastasis.  IMPRESSION:  1.  Asymmetric increased uptake associated with the right breast and right breast skin thickening corresponding to biopsy-proven invasive breast cancer. 2.  Enlarged and hypermetabolic right axillary lymph nodes consistent with metastatic adenopathy. 3.  No evidence for distant metastatic disease. 4.  Focal area of increased uptake within the left axilla.  The patient is noted to have a corpus luteal cyst are not diagnostic CT images.  This activity is favored to represent physiologic activity.  Consider confirmatory imaging with pelvic sonogram.   Original Report Authenticated By: Signa Kell, M.D.   Ir Fluoro Guide Cv Line Left  01/15/2013   *RADIOLOGY REPORT*  Clinical Data/Indication: Breast cancer  TUNNEL POWER PORT PLACEMENT WITH SUBCUTANEOUS POCKET UTILIZING ULTRASOUND & FLOUROSCOPY  Sedation: Versed 5.0 mg, Fentanyl 250 mcg.  Total Moderate Sedation Time: 35 minutes.  As antibiotic prophylaxis, Ancef  was ordered pre-procedure and administered intravenously within one hour of incision.  Fluoroscopy Time: 36 seconds.  Procedure:  After written informed consent was obtained, patient was placed in the supine position on  angiographic table. The left neck and chest was prepped and draped in a sterile fashion. Lidocaine was utilized for local anesthesia.  The left internal jugular vein was noted to be patent initially with ultrasound. Under sonographic guidance, a micropuncture needle was inserted into the left IJ vein (Ultrasound and fluoroscopic image documentation was performed). The needle was removed over an 018 wire which was exchanged for a Amplatz.  This was advanced into the IVC.  An 8-French dilator was advanced over the Amplatz.  A small incision was made in the left upper chest over the anterior left second rib.  Utilizing blunt dissection, a subcutaneous pocket was created in the caudal direction. The pocket was irrigated with a copious amount of sterile normal saline.  The port catheter was tunneled from the chest incision, and out the neck incision.  The reservoir was inserted into the subcutaneous pocket and secured with two 3-0 Ethilon stitches.  A peel-away sheath was advanced over the Amplatz wire.  The port catheter was cut to measure length and inserted through the peel-away sheath.  The peel-away sheath was removed.  The chest incision was closed with 3-0 Vicryl interrupted stitches for the subcutaneous tissue and a running of 4- 0 Vicryl subcuticular stitch for the skin.  The neck incision was closed with a 4-0 Vicryl subcuticular stitch.  Derma-bond was applied to both surgical incisions.  The port reservoir was flushed and instilled with heparinized saline.  No complications.  Findings:  A left IJ vein Port-A-Cath is in place with its tip at the cavoatrial junction.  IMPRESSION: Successful 8 French left internal jugular vein power port placement with its tip at the SVC/RA junction.  Original Report Authenticated By: Jolaine Click, M.D.   Ir US Guide Vasc Access Left  01/15/2013   *RADIOLOGY REPORT*  Clinical Data/Indication: Breast cancer  TUNNEL POWER PORT PLACEMENT WITH SUBCUTANEOUS POCKET UTILIZING  ULTRASOUND & FLOUROSCOPY  Sedation: Versed 5.0 mg, Fentanyl 250 mcg.  Total Moderate Sedation Time: 35 minutes.  As antibiotic prophylaxis, Ancef  was ordered pre-procedure and administered intravenously within one hour of incision.  Fluoroscopy Time: 36 seconds.  Procedure:  After written informed consent was obtained, patient was placed in the supine position on angiographic table. The left neck and chest was prepped and draped in a sterile fashion. Lidocaine was utilized for local anesthesia.  The left internal jugular vein was noted to be patent initially with ultrasound. Under sonographic guidance, a micropuncture needle was inserted into the left IJ vein (Ultrasound and fluoroscopic image documentation was performed). The needle was removed over an 018 wire which was exchanged for a Amplatz.  This was advanced into the IVC.  An 8-French dilator was advanced over the Amplatz.  A small incision was made in the left upper chest over the anterior left second rib.  Utilizing blunt dissection, a subcutaneous pocket was created in the caudal direction. The pocket was irrigated with a copious amount of sterile normal saline.  The port catheter was tunneled from the chest incision, and out the neck incision.  The reservoir was inserted into the subcutaneous pocket and secured with two 3-0 Ethilon stitches.  A peel-away sheath was advanced over the Amplatz wire.  The port catheter was cut to measure length and inserted through the peel-away sheath.  The peel-away sheath was removed.  The chest incision was closed with 3-0 Vicryl interrupted stitches for the subcutaneous tissue and a running of 4- 0 Vicryl subcuticular stitch for the skin.  The neck incision was closed with a 4-0 Vicryl subcuticular stitch.  Derma-bond was applied to both surgical incisions.  The port reservoir was flushed and instilled with heparinized saline.  No complications.  Findings:  A left IJ vein Port-A-Cath is in place with its tip at the  cavoatrial junction.  IMPRESSION: Successful 8 French left internal jugular vein power port placement with its tip at the SVC/RA junction.   Original Report Authenticated By: Jolaine Click, M.D.    ASSESSMENT: 47 year old female with  #1 new diagnosis of 8.0 cm ER negative PR negative HER-2/neu positive invasive ductal carcinoma of the right breast with a Ki-67 of 76%. Patient is now receiving neoadjuvant chemotherapy to help shrink the right breast mass. She will most likely need a mastectomy with axillary lymph node dissection.  #2 we discussed neoadjuvant chemotherapy with to anti-HER-2 therapies including Herceptin and perjeta along with Taxotere carboplatinum. All 4 drugs will be given every 3 weeks. She will require day 2 Neulasta. She has all of her anti-emetics.  #3 she'll be seen by cardiology in the CHF clinic for ongoing surveillance because she is at risk for heart failure secondary to the anti-HER-2 therapy.  #4 patient began cycle 1 of TCH/perjeta on 01/16/2013. So far she is doing well she did not drop her counts. She has developed some mucositis and what looks like herpes labialis and yeast infection.   PLAN:  #1 keep yourself well hydrated. She's had expected side effects from her chemotherapy.  #2 for yeast infection patient will begin Diflucan 200 mg daily for 5 days.  #34 mouth sores she will start Valtrex 500 mg twice a day for 5 days then 1 a  day for prophylaxis.  #4 patient is counseled to drink plenty of water to keep herself well hydrated.  #5 she will be seen back on 02/07/2003 team for cycle 2 of her chemotherapy.  All questions were answered. The patient knows to call the clinic with any problems, questions or concerns. We can certainly see the patient much sooner if necessary.  I spent 25 minutes counseling the patient face to face. The total time spent in the appointment was 30 minutes.    Drue Second, MD Medical/Oncology [facility] [phone] (beeper) [phone] (Office)

## 2013-02-18 ENCOUNTER — Telehealth: Payer: Self-pay | Admitting: Medical Oncology

## 2013-02-18 NOTE — Telephone Encounter (Signed)
Pt LVMOM stating having "sore throat on one side and ear hurts." Denies mouth sores and fever. Reviewed with MD, patient to f/u with her PCP. Patient expressed verbal understanding. Knows to call office should symptoms worsen.

## 2013-02-21 ENCOUNTER — Encounter: Payer: Self-pay | Admitting: *Deleted

## 2013-02-21 NOTE — Progress Notes (Signed)
Mailed after appt letter to pt. 

## 2013-02-27 ENCOUNTER — Encounter: Payer: Self-pay | Admitting: Oncology

## 2013-02-27 ENCOUNTER — Ambulatory Visit (HOSPITAL_BASED_OUTPATIENT_CLINIC_OR_DEPARTMENT_OTHER): Payer: BC Managed Care – PPO

## 2013-02-27 ENCOUNTER — Other Ambulatory Visit: Payer: BC Managed Care – PPO | Admitting: Lab

## 2013-02-27 ENCOUNTER — Other Ambulatory Visit (HOSPITAL_BASED_OUTPATIENT_CLINIC_OR_DEPARTMENT_OTHER): Payer: BC Managed Care – PPO | Admitting: Lab

## 2013-02-27 ENCOUNTER — Ambulatory Visit (HOSPITAL_BASED_OUTPATIENT_CLINIC_OR_DEPARTMENT_OTHER): Payer: BC Managed Care – PPO | Admitting: Oncology

## 2013-02-27 VITALS — BP 111/70 | HR 101 | Temp 98.1°F | Resp 20 | Ht 64.0 in | Wt 141.4 lb

## 2013-02-27 DIAGNOSIS — C50419 Malignant neoplasm of upper-outer quadrant of unspecified female breast: Secondary | ICD-10-CM

## 2013-02-27 DIAGNOSIS — C773 Secondary and unspecified malignant neoplasm of axilla and upper limb lymph nodes: Secondary | ICD-10-CM

## 2013-02-27 DIAGNOSIS — Z171 Estrogen receptor negative status [ER-]: Secondary | ICD-10-CM

## 2013-02-27 DIAGNOSIS — C50911 Malignant neoplasm of unspecified site of right female breast: Secondary | ICD-10-CM

## 2013-02-27 DIAGNOSIS — Z5111 Encounter for antineoplastic chemotherapy: Secondary | ICD-10-CM

## 2013-02-27 DIAGNOSIS — Z5112 Encounter for antineoplastic immunotherapy: Secondary | ICD-10-CM

## 2013-02-27 LAB — COMPREHENSIVE METABOLIC PANEL (CC13)
BUN: 20 mg/dL (ref 7.0–26.0)
CO2: 23 mEq/L (ref 22–29)
Creatinine: 0.8 mg/dL (ref 0.6–1.1)
Glucose: 276 mg/dl — ABNORMAL HIGH (ref 70–140)
Total Bilirubin: 0.62 mg/dL (ref 0.20–1.20)

## 2013-02-27 LAB — CBC WITH DIFFERENTIAL/PLATELET
Eosinophils Absolute: 0 10*3/uL (ref 0.0–0.5)
HCT: 35.3 % (ref 34.8–46.6)
LYMPH%: 4.2 % — ABNORMAL LOW (ref 14.0–49.7)
MCHC: 34.1 g/dL (ref 31.5–36.0)
MONO#: 0.1 10*3/uL (ref 0.1–0.9)
NEUT#: 17.3 10*3/uL — ABNORMAL HIGH (ref 1.5–6.5)
NEUT%: 94.3 % — ABNORMAL HIGH (ref 38.4–76.8)
Platelets: 327 10*3/uL (ref 145–400)
WBC: 18.4 10*3/uL — ABNORMAL HIGH (ref 3.9–10.3)

## 2013-02-27 MED ORDER — SODIUM CHLORIDE 0.9 % IV SOLN
695.4000 mg | Freq: Once | INTRAVENOUS | Status: AC
  Administered 2013-02-27: 700 mg via INTRAVENOUS
  Filled 2013-02-27: qty 70

## 2013-02-27 MED ORDER — SODIUM CHLORIDE 0.9 % IV SOLN
Freq: Once | INTRAVENOUS | Status: AC
  Administered 2013-02-27: 14:00:00 via INTRAVENOUS

## 2013-02-27 MED ORDER — ACETAMINOPHEN 325 MG PO TABS
650.0000 mg | ORAL_TABLET | Freq: Once | ORAL | Status: AC
  Administered 2013-02-27: 650 mg via ORAL

## 2013-02-27 MED ORDER — TRASTUZUMAB CHEMO INJECTION 440 MG
6.0000 mg/kg | Freq: Once | INTRAVENOUS | Status: AC
  Administered 2013-02-27: 399 mg via INTRAVENOUS
  Filled 2013-02-27: qty 19

## 2013-02-27 MED ORDER — HEPARIN SOD (PORK) LOCK FLUSH 100 UNIT/ML IV SOLN
500.0000 [IU] | Freq: Once | INTRAVENOUS | Status: AC | PRN
  Administered 2013-02-27: 500 [IU]
  Filled 2013-02-27: qty 5

## 2013-02-27 MED ORDER — DEXAMETHASONE SODIUM PHOSPHATE 20 MG/5ML IJ SOLN
20.0000 mg | Freq: Once | INTRAMUSCULAR | Status: AC
  Administered 2013-02-27: 20 mg via INTRAVENOUS

## 2013-02-27 MED ORDER — SODIUM CHLORIDE 0.9 % IV SOLN
75.0000 mg/m2 | Freq: Once | INTRAVENOUS | Status: AC
  Administered 2013-02-27: 130 mg via INTRAVENOUS
  Filled 2013-02-27: qty 13

## 2013-02-27 MED ORDER — ONDANSETRON 16 MG/50ML IVPB (CHCC)
16.0000 mg | Freq: Once | INTRAVENOUS | Status: AC
  Administered 2013-02-27: 16 mg via INTRAVENOUS

## 2013-02-27 MED ORDER — SODIUM CHLORIDE 0.9 % IJ SOLN
10.0000 mL | INTRAMUSCULAR | Status: DC | PRN
  Administered 2013-02-27: 10 mL
  Filled 2013-02-27: qty 10

## 2013-02-27 MED ORDER — SODIUM CHLORIDE 0.9 % IV SOLN
420.0000 mg | Freq: Once | INTRAVENOUS | Status: AC
  Administered 2013-02-27: 420 mg via INTRAVENOUS
  Filled 2013-02-27: qty 14

## 2013-02-27 MED ORDER — LORAZEPAM 2 MG/ML IJ SOLN
0.5000 mg | Freq: Once | INTRAMUSCULAR | Status: AC
  Administered 2013-02-27: 0.5 mg via INTRAVENOUS

## 2013-02-27 MED ORDER — DIPHENHYDRAMINE HCL 25 MG PO CAPS
50.0000 mg | ORAL_CAPSULE | Freq: Once | ORAL | Status: AC
  Administered 2013-02-27: 50 mg via ORAL

## 2013-02-27 NOTE — Progress Notes (Signed)
I gave the patient the ph# for billing

## 2013-02-28 ENCOUNTER — Ambulatory Visit (HOSPITAL_BASED_OUTPATIENT_CLINIC_OR_DEPARTMENT_OTHER): Payer: BC Managed Care – PPO

## 2013-02-28 VITALS — BP 118/77 | HR 96 | Temp 97.6°F | Resp 20

## 2013-02-28 DIAGNOSIS — Z5189 Encounter for other specified aftercare: Secondary | ICD-10-CM

## 2013-02-28 DIAGNOSIS — C773 Secondary and unspecified malignant neoplasm of axilla and upper limb lymph nodes: Secondary | ICD-10-CM

## 2013-02-28 DIAGNOSIS — C50419 Malignant neoplasm of upper-outer quadrant of unspecified female breast: Secondary | ICD-10-CM

## 2013-02-28 MED ORDER — PEGFILGRASTIM INJECTION 6 MG/0.6ML
6.0000 mg | Freq: Once | SUBCUTANEOUS | Status: AC
  Administered 2013-02-28: 6 mg via SUBCUTANEOUS

## 2013-03-02 ENCOUNTER — Telehealth: Payer: Self-pay | Admitting: Oncology

## 2013-03-02 NOTE — Telephone Encounter (Signed)
Per 7/21 pof f/u as scheduled.

## 2013-03-02 NOTE — Progress Notes (Signed)
OFFICE PROGRESS NOTE  CC  [name], MD [address] [place] [address] Dr. Lurline Hare  Dr. 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 At that time she had mammogram performed and it was negative. However her breast continue to increase in size. Initially patient felt that it could have been cysts or something on and therefore she did not go to a physician. However subsequently patient's breast became quite painful and she also began to feel in knot under her right arm.because of this she went to her physician who did a physical exam and a mammogram was performed on 01/01/2013 the mammogram showed diffuse skin thickening generalized increased density within the right breast and enlarged right axillary lymph nodes. She went on to have an ultrasound of the right breast performed that showed a large spiculated hypoechoic mass in the right breast 11:00 2 cm from the nipple measuring at least 4.5 cm in size. There were abnormal lymph nodes with thickened cortex and the right axilla the largest one measuring 3.1 cm in dimension.  #2Patient was recommended ultrasound guided core biopsy of the right breast mass and the right axilla lymph node. The biopsy was performed on 01/01/2013. The right needle core biopsy of the primary mass showed invasive mammary carcinoma grade 2-3 was invasive ductal. The lymph node of the right axilla was positive for metastatic disease. The tumor was ER PR negative HER-2/neu positive with a Ki-67 of 76%. On 01/09/2013 patient had MRI of the breasts performed that showed diffuse right breast skin thickening and edema with right axillary lymph node. There was a 5 mm internal mammary lymph node that  was incompletely imaged. The right breast mass measured 8 x 5 x 4.7 cm. There were abnormal areas involving all 4 quadrants. There was however no involvement of the pectoralis minor for lower chest wall. The left breast showed 2 areas of nodular enhancement these were biopsied and were fibroadenomas  #3 it was recommended that patient begin neoadjuvant chemotherapy consisting of Taxotere carboplatinum Herceptin and perjeta. This would be given every 3 weeks with day 2 Neulasta. A total of 6 cycles of therapy were planned. Once she completes the treatment then she would proceed with MRI of the breasts again for evaluation a response to therapy and eventually a mastectomy.  #4 patient began Taxotere carboplatinum Herceptin and perjeta starting 01/16/2013.  CURRENT THERAPY: Here for cycle 3 TCH/perjeta with day 2 Neulasta   INTERVAL HISTORY: Jasmine Schwartz 47 y.o. female returns for followup visit today overall she is doing well she is without any complaints. Overall she's doing well she is tolerating her chemotherapy very nicely without any problems. Her blood work looks Community education officer. She denies any nausea vomiting fevers chills night sweats headaches shortness of breath chest pains or palpitations no peripheral paresthesias. No myalgias and arthralgias. Remainder of the 10 point review of systems is negative.  MEDICAL HISTORY: Past Medical History  Diagnosis Date  . Anxiety   . Depression   . Dyslipidemia   . Anxiety and depression 07/06/2011  . Breast cancer 01/01/13    /metastatic 1/1 lymph nodes  . Diabetes mellitus     IDDM  . Hx of insertion of insulin pump  ALLERGIES:  has No Known Allergies.  MEDICATIONS:  Current Outpatient Prescriptions  Medication Sig Dispense Refill  . ALPRAZolam (XANAX) 0.25 MG tablet Take 0.25 mg by mouth 3 (three) times daily as needed for sleep or anxiety.      Marland Kitchen glucose blood (NOVA MAX TEST) test strip Use as directed up to 8 times a day      .  Insulin Infusion Pump (MINIMED INSULIN PUMP) DEVI Inject 1 Units/hr into the vein continuous. Pt give extra per cards at meal times. Basic meals are about 5 units      . Insulin Infusion Pump Supplies (MINIMED INFUSION SET-MMT 397) MISC 1 Device by Does not apply route every 3 (three) days.      . Insulin Infusion Pump Supplies (PARADIGM PUMP RESERVOIR 1.76ML) MISC 1 Device by Does not apply route every 3 (three) days.      . insulin lispro (HUMALOG) 100 UNIT/ML injection Inject as directed into Insulin pump, total of 40 units/day      . lidocaine-prilocaine (EMLA) cream Apply topically as needed. Apply to port-a-cath site 1-2 hours before treatment.  30 g  3  . LORazepam (ATIVAN) 0.5 MG tablet Take 0.5 mg by mouth every 6 (six) hours as needed (Nausea or vomiting).      . ondansetron (ZOFRAN) 8 MG tablet Take 8 mg by mouth 2 (two) times daily. Take two times a day starting the day after chemo for 3 days. Then take two times a day as needed for nausea or vomiting.      . prochlorperazine (COMPAZINE) 10 MG tablet Take 1 tablet (10 mg total) by mouth every 6 (six) hours as needed (Nausea or vomiting).  30 tablet  1  . valACYclovir (VALTREX) 500 MG tablet Take 1 tablet (500 mg total) by mouth 2 (two) times daily.  30 tablet  4  . Alum & Mag Hydroxide-Simeth (MAGIC MOUTHWASH W/LIDOCAINE) SOLN Take 5 mLs by mouth 4 (four) times daily as needed. Swish and spit.  120 mL  1  . dexamethasone (DECADRON) 4 MG tablet Take 8 mg by mouth 2 (two) times daily with a meal. Take two times a day the day before Taxotere. Then take two times a day starting the day after chemo for 3 days.      . fluconazole (DIFLUCAN) 200 MG tablet Take 1 tablet (200 mg total) by mouth daily.  5 tablet  5  . prochlorperazine (COMPAZINE) 25 MG suppository Place 25 mg rectally every 12 (twelve) hours as needed for nausea.       No current facility-administered medications for this visit.    SURGICAL HISTORY:  Past Surgical History   Procedure Laterality Date  . Lumbar spine surgery  2009    Dr Jillyn Hidden  . Cesarean section  1989  . Tubal ligation  1991  . Breast biopsy Right 01/01/13    Invasive mammary ca,metastatic in 1/1 lymph node  . Breast biopsy Left 01/09/13    Fibroadenoma,microcalcifications    REVIEW OF SYSTEMS:  Pertinent items are noted in HPI.   HEALTH MAINTENANCE:  PHYSICAL EXAMINATION: Blood pressure 111/70, pulse 101, temperature 98.1 F (36.7 C), temperature source Oral, resp. rate 20, height 5\' 4"  (1.626 m), weight 141 lb 6.4 oz (64.139 kg). Body mass index is 24.26 kg/(m^2). ECOG PERFORMANCE STATUS: 0 - Asymptomatic   General appearance: alert, cooperative and appears stated age Resp: clear to auscultation bilaterally Cardio: regular rate and rhythm GI: soft, non-tender; bowel sounds normal; no masses,  no organomegaly Extremities: extremities normal, atraumatic, no cyanosis or edema Neurologic: Grossly normal Right breast reveals a large palpable mass without any nipple discharge it is softer Left breast no masses or nipple discharge  LABORATORY DATA: Lab Results  Component Value Date   WBC 18.4* 02/27/2013   HGB 12.0 02/27/2013   HCT 35.3 02/27/2013   MCV 90.1 02/27/2013   PLT 327 02/27/2013      Chemistry      Component Value Date/Time   NA 136 02/27/2013 1104   NA 140 03/31/2012 0934   K 4.3 02/27/2013 1104   K 4.7 03/31/2012 0934   CL 99 01/23/2013 1251   CL 106 03/31/2012 0934   CO2 23 02/27/2013 1104   CO2 27 03/31/2012 0934   BUN 20.0 02/27/2013 1104   BUN 13 03/31/2012 0934   CREATININE 0.8 02/27/2013 1104   CREATININE 0.6 03/31/2012 0934      Component Value Date/Time   CALCIUM 9.5 02/27/2013 1104   CALCIUM 8.9 03/31/2012 0934   ALKPHOS 75 02/27/2013 1104   ALKPHOS 42 03/31/2012 0934   AST 16 02/27/2013 1104   AST 21 03/31/2012 0934   ALT 16 02/27/2013 1104   ALT 15 03/31/2012 0934   BILITOT 0.62 02/27/2013 1104   BILITOT 0.7 03/31/2012 0934       RADIOGRAPHIC STUDIES:  Ct  Chest W Contrast  01/20/2013   *RADIOLOGY REPORT*  Clinical Data:  High risk breast cancer.  Evaluate for metastatic disease.  CT CHEST, ABDOMEN AND PELVIS WITH CONTRAST  Technique:  Multidetector CT imaging of the chest, abdomen and pelvis was performed following the standard protocol during bolus administration of intravenous contrast.  Contrast: OMNIPAQUE IOHEXOL 300 MG/ML  SOLN  Comparison:  None  CT CHEST  Findings:  Lungs/pleura: There is no pleural effusion.  No suspicious pulmonary nodule or mass noted.  Heart/Mediastinum: Heart size is normal.  No pericardial effusion. No mediastinal or hilar adenopathy.  Bones/Musculoskeletal:  Asymmetric increased soft tissue within the right breast parenchyma corresponding to patient's biopsy-proven invasive breast carcinoma.  There is associated overlying skin thickening.  Multiple enlarged right axillary lymph nodes are identified.  Index lymph node measures 1.7 cm, image 23/series 2. Several enhancing, sub centimeter retropectoral lymph nodes are identified.  IMPRESSION:  1.  No acute findings. 2.  Right breast invasive carcinoma with associated right axillary adenopathy.  CT ABDOMEN AND PELVIS  Findings:  There are no focal liver abnormalities identified.  The gallbladder appears normal.  There is no biliary dilatation.  The pancreas is unremarkable.  Normal appearance of the spleen.  The adrenal glands are both normal.  The kidneys are unremarkable. Urinary bladder appears normal.  The uterus is unremarkable. Corpus luteal cyst is noted in the left ovary measuring 1.6 cm.  No free fluid or fluid collections identified within the upper abdomen or pelvis.  The abdominal aorta has a normal caliber. There is no adenopathy identified within the upper abdomen.  No pelvic or inguinal adenopathy noted.  The stomach is normal.  The small bowel loops are unremarkable. Normal appearance of the colon.  Review of the visualized osseous structures is negative for  aggressive lytic or sclerotic bone lesion.  IMPRESSION:  1.  No acute findings within the abdomen or pelvis. 2.  No mass or adenopathy.   Original Report Authenticated By: Signa Kell, M.D.   US Breast Left  01/09/2013   *RADIOLOGY REPORT*  Clinical Data:  47 year old female with newly diagnosed right breast  cancer.  Left mammogram to compare to presurgical MRI.  DIGITAL DIAGNOSTIC LEFT MAMMOGRAM WITH CAD AND LEFT BREAST ULTRASOUND:  Comparison:  06/11/2012 mammogram and 01/09/2013 MRI  Findings:  ACR Breast Density Category 3: The breast tissue is heterogeneously dense.  A possible circumscribed mass in the upper outer left breast is identified. No suspicious calcifications or distortion identified.  Mammographic images were processed with CAD.  On physical exam, no palpable abnormalities identified within the upper left breast.  Ultrasound is performed, showing a 6 x 6 x 8 mm slightly irregular hypoechoic mass at the 11 o'clock position of the left breast 5 cm from the nipple An 11 x 7 x 9 mm slightly irregular hypoechoic mass at the 2 o'clock position of the left breast 4 cm from the nipple is also present. No enlarged or abnormal appearing left axillary lymph nodes are identified.  IMPRESSION: Indeterminate 6 x 6 x 8 mm hypoechoic mass at the 11 o'clock position of the left breast and 11 x 7 x 9 mm hypoechoic mass in the 2 o'clock position of the left breast.  The 11 o'clock position mass probably represents the enhancing nodule identified on today's MRI.  Tissue sampling of both masses is recommended.  BI-RADS CATEGORY 4:  Suspicious abnormality - biopsy should be considered.  RECOMMENDATION: Ultrasound guided biopsies of both left breast masses.  The findings and recommendations were discussed with the patient and she desires to proceed with ultrasound guided left breast biopsies. These biopsies will be performed today but dictated in a separate report.  Results were also provided in writing at the  conclusion of the visit.  If applicable, a reminder letter will be sent to the patient regarding the next appointment.   Original Report Authenticated By: Harmon Pier, M.D.   Ct Abdomen Pelvis W Contrast  01/22/2013   *RADIOLOGY REPORT*  Clinical Data:  High risk breast cancer.  Evaluate for metastatic disease.  CT CHEST, ABDOMEN AND PELVIS WITH CONTRAST  Technique:  Multidetector CT imaging of the chest, abdomen and pelvis was performed following the standard protocol during bolus administration of intravenous contrast.  Contrast: OMNIPAQUE IOHEXOL 300 MG/ML  SOLN  Comparison:  None  CT CHEST  Findings:  Lungs/pleura: There is no pleural effusion.  No suspicious pulmonary nodule or mass noted.  Heart/Mediastinum: Heart size is normal.  No pericardial effusion. No mediastinal or hilar adenopathy.  Bones/Musculoskeletal:  Asymmetric increased soft tissue within the right breast parenchyma corresponding to patient's biopsy-proven invasive breast carcinoma.  There is associated overlying skin thickening.  Multiple enlarged right axillary lymph nodes are identified.  Index lymph node measures 1.7 cm, image 23/series 2. Several enhancing, sub centimeter retropectoral lymph nodes are identified.  IMPRESSION:  1.  No acute findings. 2.  Right breast invasive carcinoma with associated right axillary adenopathy.  CT ABDOMEN AND PELVIS  Findings:  There are no focal liver abnormalities identified.  The gallbladder appears normal.  There is no biliary dilatation.  The pancreas is unremarkable.  Normal appearance of the spleen.  The adrenal glands are both normal.  The kidneys are unremarkable. Urinary bladder appears normal.  The uterus is unremarkable. Corpus luteal cyst is noted in the left ovary measuring 1.6 cm.  No free fluid or fluid collections identified within the upper abdomen or pelvis.  The abdominal aorta has a normal caliber. There is no adenopathy identified within the upper abdomen.  No pelvic or  inguinal adenopathy noted.  The stomach is normal.  The  small bowel loops are unremarkable. Normal appearance of the colon.  Review of the visualized osseous structures is negative for aggressive lytic or sclerotic bone lesion.  IMPRESSION:  1.  No acute findings within the abdomen or pelvis. 2.  No mass or adenopathy.   Original Report Authenticated By: Signa Kell, M.D.   Mr Breast Bilateral W Wo Contrast  01/09/2013   *RADIOLOGY REPORT*  Clinical Data: Biopsy-proven right breast inflammatory carcinoma with primary invasive mammary carcinoma and previous biopsy and known right axillary metastatic disease.  Preoperative staging MRI.  BUN and creatinine were obtained on site at [facility] at [address] Results:  BUN 10 mg/dL,  Creatinine 0.9 mg/dL.  BILATERAL BREAST MRI WITH AND WITHOUT CONTRAST  Technique: Multiplanar, multisequence MR images of both breasts were obtained prior to and following the intravenous administration of 13ml of Multihance.  Three dimensional images were evaluated at the independent DynaCad workstation.  Comparison:  Prior mammograms and ultrasound.  The patient underwent diagnostic mammography and ultrasound of the left breast today as no recent prior exam was available after the recent diagnosis in the right breast.  Findings: There is diffuse right breast skin thickening and edema, trabecular edema, and right axillary lymphadenopathy with maximal nodal measurement 3.1 cm short-axis diameter.  There is also a 5 mm internal mammary artery chain lymph node identified image 26 series 2. There is an incompletely visualized lymph node subjacent to the pectoralis minor muscle on the first image of the T2-weighted series.  There is a large irregular enhancing mass in the right breast 11 o'clock location with associated clip artifact measuring maximally 8.0 x 5.6 by 4.7 cm. This demonstrates washin/washout type enhancement kinetics.  This corresponds to the biopsy-proven  invasive mammary carcinoma.  There are abnormal nodular areas of trabecular has been throughout the right breast involving all four quadrants. No abnormal underlying pectoralis enhancement or chest wall involvement is identified.  In the left breast 11 o'clock location, there is an 8 mm enhancing nodule corresponding to that seen at the examination earlier today. This area demonstrates plateau type enhancement kinetics.  No left- sided axillary or internal mammary artery chain lymphadenopathy is identified.  There is mild inhomogeneous nodular enhancement in the left breast two o'clock location at the site of the mass seen in this location earlier today, measuring 9 mm, measuring progressive enhancement kinetics.  IMPRESSION: Large irregular enhancing right breast mass 11 o'clock location corresponding to biopsy-proven invasive mammary carcinoma, with metastatic right axillary lymphadenopathy as well as skin and trabecular thickening/enhancement compatible with the clinical diagnosis of inflammatory and/or locally advanced breast cancer.  5 mm abnormal right internal mammary artery chain lymph node.  8 mm enhancing mass left breast 11 o'clock location and mildly irregular area of mass-like enhancement in the left breast two o'clock location, corresponding to those seen at diagnostic examination earlier today.  Biopsy has been scheduled for today and will be dictated separately.  RECOMMENDATION: Treatment plan  THREE-DIMENSIONAL MR IMAGE RENDERING ON INDEPENDENT WORKSTATION:  Three-dimensional MR images were rendered by post-processing of the original MR data on an independent workstation.  The three- dimensional MR images were interpreted, and findings were reported in the accompanying complete MRI report for this study.  BI-RADS CATEGORY 6:  Known biopsy-proven malignancy - appropriate action should be taken.   Original Report Authenticated By: Christiana Pellant, M.D.   Nm Pet Image Initial (pi) Skull Base To  Thigh  01/20/2013   *RADIOLOGY REPORT*  Clinical Data: Initial  treatment strategy for breast cancer.  NUCLEAR MEDICINE PET SKULL BASE TO THIGH  Fasting Blood Glucose:  263  Technique:  19.1 mCi F-18 FDG was injected intravenously. CT data was obtained and used for attenuation correction and anatomic localization only.  (This was not acquired as a diagnostic CT examination.) Additional exam technical data entered on technologist worksheet.  Comparison:  None  Findings:  Neck: No hypermetabolic lymph nodes in the neck.  Chest:  No hypermetabolic mediastinal or hilar nodes.  No suspicious pulmonary nodules on the CT scan. Enlarged right axillary lymph nodes identified.  Index lymph node measures 1.7 cm and has an SUV max equal to 2.8, image 77.  There is asymmetric increased uptake throughout the right breast with associated overlying skin thickening.  This has an SUV max equal to 4.4, image 101.  Abdomen/Pelvis:  No abnormal hypermetabolic activity within the liver, pancreas, adrenal glands, or spleen.  No hypermetabolic lymph nodes in the abdomen or pelvis. There is a small focus of increased FDG uptake within the left ovary.  This has an SUV max equal to 13.1, image 217.  Skeleton:  No focal hypermetabolic activity to suggest skeletal metastasis.  IMPRESSION:  1.  Asymmetric increased uptake associated with the right breast and right breast skin thickening corresponding to biopsy-proven invasive breast cancer. 2.  Enlarged and hypermetabolic right axillary lymph nodes consistent with metastatic adenopathy. 3.  No evidence for distant metastatic disease. 4.  Focal area of increased uptake within the left axilla.  The patient is noted to have a corpus luteal cyst are not diagnostic CT images.  This activity is favored to represent physiologic activity.  Consider confirmatory imaging with pelvic sonogram.   Original Report Authenticated By: Signa Kell, M.D.   Ir Fluoro Guide Cv Line Left  01/15/2013   *RADIOLOGY  REPORT*  Clinical Data/Indication: Breast cancer  TUNNEL POWER PORT PLACEMENT WITH SUBCUTANEOUS POCKET UTILIZING ULTRASOUND & FLOUROSCOPY  Sedation: Versed 5.0 mg, Fentanyl 250 mcg.  Total Moderate Sedation Time: 35 minutes.  As antibiotic prophylaxis, Ancef  was ordered pre-procedure and administered intravenously within one hour of incision.  Fluoroscopy Time: 36 seconds.  Procedure:  After written informed consent was obtained, patient was placed in the supine position on angiographic table. The left neck and chest was prepped and draped in a sterile fashion. Lidocaine was utilized for local anesthesia.  The left internal jugular vein was noted to be patent initially with ultrasound. Under sonographic guidance, a micropuncture needle was inserted into the left IJ vein (Ultrasound and fluoroscopic image documentation was performed). The needle was removed over an 018 wire which was exchanged for a Amplatz.  This was advanced into the IVC.  An 8-French dilator was advanced over the Amplatz.  A small incision was made in the left upper chest over the anterior left second rib.  Utilizing blunt dissection, a subcutaneous pocket was created in the caudal direction. The pocket was irrigated with a copious amount of sterile normal saline.  The port catheter was tunneled from the chest incision, and out the neck incision.  The reservoir was inserted into the subcutaneous pocket and secured with two 3-0 Ethilon stitches.  A peel-away sheath was advanced over the Amplatz wire.  The port catheter was cut to measure length and inserted through the peel-away sheath.  The peel-away sheath was removed.  The chest incision was closed with 3-0 Vicryl interrupted stitches for the subcutaneous tissue and a running of 4- 0 Vicryl subcuticular stitch for the  skin.  The neck incision was closed with a 4-0 Vicryl subcuticular stitch.  Derma-bond was applied to both surgical incisions.  The port reservoir was flushed and instilled with  heparinized saline.  No complications.  Findings:  A left IJ vein Port-A-Cath is in place with its tip at the cavoatrial junction.  IMPRESSION: Successful 8 French left internal jugular vein power port placement with its tip at the SVC/RA junction.   Original Report Authenticated By: Jolaine Click, M.D.   Ir US Guide Vasc Access Left  01/15/2013   *RADIOLOGY REPORT*  Clinical Data/Indication: Breast cancer  TUNNEL POWER PORT PLACEMENT WITH SUBCUTANEOUS POCKET UTILIZING ULTRASOUND & FLOUROSCOPY  Sedation: Versed 5.0 mg, Fentanyl 250 mcg.  Total Moderate Sedation Time: 35 minutes.  As antibiotic prophylaxis, Ancef  was ordered pre-procedure and administered intravenously within one hour of incision.  Fluoroscopy Time: 36 seconds.  Procedure:  After written informed consent was obtained, patient was placed in the supine position on angiographic table. The left neck and chest was prepped and draped in a sterile fashion. Lidocaine was utilized for local anesthesia.  The left internal jugular vein was noted to be patent initially with ultrasound. Under sonographic guidance, a micropuncture needle was inserted into the left IJ vein (Ultrasound and fluoroscopic image documentation was performed). The needle was removed over an 018 wire which was exchanged for a Amplatz.  This was advanced into the IVC.  An 8-French dilator was advanced over the Amplatz.  A small incision was made in the left upper chest over the anterior left second rib.  Utilizing blunt dissection, a subcutaneous pocket was created in the caudal direction. The pocket was irrigated with a copious amount of sterile normal saline.  The port catheter was tunneled from the chest incision, and out the neck incision.  The reservoir was inserted into the subcutaneous pocket and secured with two 3-0 Ethilon stitches.  A peel-away sheath was advanced over the Amplatz wire.  The port catheter was cut to measure length and inserted through the peel-away sheath.  The  peel-away sheath was removed.  The chest incision was closed with 3-0 Vicryl interrupted stitches for the subcutaneous tissue and a running of 4- 0 Vicryl subcuticular stitch for the skin.  The neck incision was closed with a 4-0 Vicryl subcuticular stitch.  Derma-bond was applied to both surgical incisions.  The port reservoir was flushed and instilled with heparinized saline.  No complications.  Findings:  A left IJ vein Port-A-Cath is in place with its tip at the cavoatrial junction.  IMPRESSION: Successful 8 French left internal jugular vein power port placement with its tip at the SVC/RA junction.   Original Report Authenticated By: Jolaine Click, M.D.   Mm Digital Diagnostic Unilat L  01/09/2013   *RADIOLOGY REPORT*  Clinical Data:  Evaluate clip placement following two separate ultrasound-guided left breast biopsies.  DIGITAL DIAGNOSTIC LEFT MAMMOGRAM  Comparison:  Previous exams  Findings:  Films are performed following two separate ultrasound guided biopsies of the left breast - at the 11 o'clock position and 2 o'clock position. The ribbon shaped biopsy clip corresponds to the 11 o'clock mass and the T shaped biopsy clip corresponds to the 2 o'clock mass. No immediate complications are identified.  IMPRESSION: Satisfactory clip placement following ultrasound guided left breast biopsies.  Pathology will be followed.   Original Report Authenticated By: Harmon Pier, M.D.   Mm Digital Diagnostic Unilat L  01/09/2013   *RADIOLOGY REPORT*  Clinical Data:  48 year old female with  newly diagnosed right breast cancer.  Left mammogram to compare to presurgical MRI.  DIGITAL DIAGNOSTIC LEFT MAMMOGRAM WITH CAD AND LEFT BREAST ULTRASOUND:  Comparison:  06/11/2012 mammogram and 01/09/2013 MRI  Findings:  ACR Breast Density Category 3: The breast tissue is heterogeneously dense.  A possible circumscribed mass in the upper outer left breast is identified. No suspicious calcifications or distortion identified.   Mammographic images were processed with CAD.  On physical exam, no palpable abnormalities identified within the upper left breast.  Ultrasound is performed, showing a 6 x 6 x 8 mm slightly irregular hypoechoic mass at the 11 o'clock position of the left breast 5 cm from the nipple An 11 x 7 x 9 mm slightly irregular hypoechoic mass at the 2 o'clock position of the left breast 4 cm from the nipple is also present. No enlarged or abnormal appearing left axillary lymph nodes are identified.  IMPRESSION: Indeterminate 6 x 6 x 8 mm hypoechoic mass at the 11 o'clock position of the left breast and 11 x 7 x 9 mm hypoechoic mass in the 2 o'clock position of the left breast.  The 11 o'clock position mass probably represents the enhancing nodule identified on today's MRI.  Tissue sampling of both masses is recommended.  BI-RADS CATEGORY 4:  Suspicious abnormality - biopsy should be considered.  RECOMMENDATION: Ultrasound guided biopsies of both left breast masses.  The findings and recommendations were discussed with the patient and she desires to proceed with ultrasound guided left breast biopsies. These biopsies will be performed today but dictated in a separate report.  Results were also provided in writing at the conclusion of the visit.  If applicable, a reminder letter will be sent to the patient regarding the next appointment.   Original Report Authenticated By: Harmon Pier, M.D.   [place] Lt Breast Bx W Loc Dev 1st Lesion Img Bx Spec US Guide  01/12/2013   *RADIOLOGY REPORT*  Clinical Data:  47 year old female with newly diagnosed right breast cancer.  Two left breast masses identified sonographically - 6 x 8 mm at the 11 o'clock position and 9 x 11 mm at the 2 o'clock position. For tissue sampling of the 6 x 8 mm mass at the 11 o'clock position.  ULTRASOUND GUIDED VACUUM ASSISTED CORE BIOPSY OF THE LEFT BREAST  Comparison: Previous exams.  I met with the patient and we discussed the procedure of ultrasound- guided  biopsy, including benefits and alternatives.  We discussed the high likelihood of a successful procedure. We discussed the risks of the procedure including infection, bleeding, tissue injury, clip migration, and inadequate sampling.  Informed written consent was given.  Using sterile technique and 2% Lidocaine as local anesthetic, under direct ultrasound visualization, a12 gauge vacuum-assisteddevice was used to perform biopsy of the 6 x 8 mm hypoechoic mass at the 11 o'clock position of the left breast 5 cm from the nipple using a medial approach. At the conclusion of the procedure, a ribbon shaped tissue marker clip was deployed into the biopsy cavity. Follow-up 2-view mammogram was performed and dictated separately.  The usual time-out protocol was performed immediately prior to the procedure.  IMPRESSION: Ultrasound-guided biopsy of 6 x 8 mm hypoechoic mass at the 11 o'clock position of the left breast.  No apparent complications.  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  Histology correlates with imaging findings.  The patient was contacted by phone on 01/12/2013 and these results given to her which  she understood. Her questions were answered. The patient had no complaints with her biopsy site.  Recommend treatment plan.   Original Report Authenticated By: Harmon Pier, M.D.   [place] Lt Breast Bx W Loc Dev Ea Add Lesion Img Bx Spec US Guide  01/12/2013   *RADIOLOGY REPORT*  Clinical Data:  47 year old female with newly diagnosed right breast cancer.  Two left breast masses identified sonographically - 6 x 8 mm at the 11 o'clock position and 9 x 11 mm at the 2 o'clock position. For tissue sampling of the 9 x 11 mm mass at the 2 o'clock position.  ULTRASOUND GUIDED VACUUM ASSISTED CORE BIOPSY OF THE LEFT BREAST  Comparison: Previous exams.  I met with the patient and we discussed the procedure of ultrasound- guided biopsy, including benefits and  alternatives.  We discussed the high likelihood of a successful procedure. We discussed the risks of the procedure including infection, bleeding, tissue injury, clip migration, and inadequate sampling.  Informed written consent was given.  Using sterile technique and 2% Lidocaine as local anesthetic, under direct ultrasound visualization, a 12 gauge vacuum-assisteddevice was used to perform biopsy of the 9 x 11 mm hypoechoic mass at the 2 o'clock position of the left breast 4 cm from the nipple using a medial approach. At the conclusion of the procedure, a  tissue marker clip was deployed into the biopsy cavity.  Follow-up 2-view mammogram was performed and dictated separately.  The usual time-out protocol was performed immediately prior to the procedure.  IMPRESSION: Ultrasound-guided biopsy of 9 x 11 mm mass in the 2 o'clock position of the left breast.  No apparent complications.  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 Histology correlates with imaging findings.  The patient was contacted by phone on 01/12/2013 and these results given to her which she understood. Her questions were answered. The patient had no complaints with her biopsy site.  Recommend treatment plan.   Original Report Authenticated By: Harmon Pier, M.D.    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 Patient is receiving neoadjuvant chemotherapy and her to therapy. She is receiving double HER-2 therapy with Herceptin and perjeta. She receives chemotherapy consisting of Taxotere and carboplatinum. All drugs are given every 3 weeks with day 2 Neulasta. She began her treatment on 01/16/2013. Thus far she is tolerating it well.  #2 patient is set up to see the genetic counselor in the next few weeks since she does have  unde breast cancer under the  age of 12. We again today discussed the rationale for referring her to genetic counseling. 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   PLAN:   #1 patient will proceed with cycle 3 of her TCH/perjeta.  #2 she will return in one week for followup and labs  All questions were answered. The patient knows to call the clinic with any problems, questions or concerns. We can certainly see the patient much sooner if necessary.  I spent 25 minutes counseling the patient face to face. The total time spent in the appointment was 30 minutes.    Drue Second, MD Medical/Oncology [facility] [phone] (beeper) [phone] (Office)  03/02/2013, 9:04 AM

## 2013-03-03 ENCOUNTER — Other Ambulatory Visit: Payer: Self-pay | Admitting: Certified Registered Nurse Anesthetist

## 2013-03-05 ENCOUNTER — Other Ambulatory Visit: Payer: BC Managed Care – PPO | Admitting: Lab

## 2013-03-05 ENCOUNTER — Encounter: Payer: BC Managed Care – PPO | Admitting: Genetic Counselor

## 2013-03-06 ENCOUNTER — Other Ambulatory Visit (HOSPITAL_BASED_OUTPATIENT_CLINIC_OR_DEPARTMENT_OTHER): Payer: BC Managed Care – PPO | Admitting: Lab

## 2013-03-06 ENCOUNTER — Ambulatory Visit (HOSPITAL_BASED_OUTPATIENT_CLINIC_OR_DEPARTMENT_OTHER): Payer: BC Managed Care – PPO | Admitting: Oncology

## 2013-03-06 VITALS — BP 104/73 | HR 116 | Temp 98.0°F | Resp 20 | Ht 64.0 in | Wt 133.9 lb

## 2013-03-06 DIAGNOSIS — C50911 Malignant neoplasm of unspecified site of right female breast: Secondary | ICD-10-CM

## 2013-03-06 DIAGNOSIS — C50919 Malignant neoplasm of unspecified site of unspecified female breast: Secondary | ICD-10-CM

## 2013-03-06 DIAGNOSIS — F411 Generalized anxiety disorder: Secondary | ICD-10-CM

## 2013-03-06 LAB — COMPREHENSIVE METABOLIC PANEL (CC13)
ALT: 15 U/L (ref 0–55)
AST: 12 U/L (ref 5–34)
Albumin: 3.7 g/dL (ref 3.5–5.0)
Calcium: 9.5 mg/dL (ref 8.4–10.4)
Chloride: 98 mEq/L (ref 98–109)
Glucose: 136 mg/dl (ref 70–140)
Potassium: 4.4 mEq/L (ref 3.5–5.1)
Total Bilirubin: 0.68 mg/dL (ref 0.20–1.20)

## 2013-03-06 LAB — CBC WITH DIFFERENTIAL/PLATELET
BASO%: 0.5 % (ref 0.0–2.0)
EOS%: 0.2 % (ref 0.0–7.0)
HGB: 13.7 g/dL (ref 11.6–15.9)
LYMPH%: 20.8 % (ref 14.0–49.7)
MCH: 31.1 pg (ref 25.1–34.0)
MCHC: 34.7 g/dL (ref 31.5–36.0)
MONO%: 14.9 % — ABNORMAL HIGH (ref 0.0–14.0)
RDW: 15.1 % — ABNORMAL HIGH (ref 11.2–14.5)
lymph#: 1.9 10*3/uL (ref 0.9–3.3)

## 2013-03-06 MED ORDER — SUCRALFATE 1 GM/10ML PO SUSP: 1.0000 g | Freq: Four times a day (QID) | ORAL | Status: DC

## 2013-03-20 ENCOUNTER — Ambulatory Visit (HOSPITAL_BASED_OUTPATIENT_CLINIC_OR_DEPARTMENT_OTHER): Payer: BC Managed Care – PPO

## 2013-03-20 ENCOUNTER — Encounter: Payer: Self-pay | Admitting: Oncology

## 2013-03-20 ENCOUNTER — Ambulatory Visit (HOSPITAL_BASED_OUTPATIENT_CLINIC_OR_DEPARTMENT_OTHER): Payer: BC Managed Care – PPO | Admitting: Oncology

## 2013-03-20 ENCOUNTER — Other Ambulatory Visit (HOSPITAL_BASED_OUTPATIENT_CLINIC_OR_DEPARTMENT_OTHER): Payer: BC Managed Care – PPO | Admitting: Lab

## 2013-03-20 VITALS — BP 122/80 | HR 101 | Temp 98.0°F | Resp 18 | Ht 64.0 in | Wt 142.6 lb

## 2013-03-20 DIAGNOSIS — C773 Secondary and unspecified malignant neoplasm of axilla and upper limb lymph nodes: Secondary | ICD-10-CM

## 2013-03-20 DIAGNOSIS — C50911 Malignant neoplasm of unspecified site of right female breast: Secondary | ICD-10-CM

## 2013-03-20 DIAGNOSIS — R609 Edema, unspecified: Secondary | ICD-10-CM

## 2013-03-20 DIAGNOSIS — C50419 Malignant neoplasm of upper-outer quadrant of unspecified female breast: Secondary | ICD-10-CM

## 2013-03-20 DIAGNOSIS — Z170 Estrogen receptor positive status [ER+]: Secondary | ICD-10-CM

## 2013-03-20 DIAGNOSIS — Z5111 Encounter for antineoplastic chemotherapy: Secondary | ICD-10-CM

## 2013-03-20 DIAGNOSIS — Z5112 Encounter for antineoplastic immunotherapy: Secondary | ICD-10-CM

## 2013-03-20 LAB — COMPREHENSIVE METABOLIC PANEL (CC13)
ALT: 19 U/L (ref 0–55)
AST: 16 U/L (ref 5–34)
Alkaline Phosphatase: 71 U/L (ref 40–150)
Creatinine: 0.9 mg/dL (ref 0.6–1.1)
Sodium: 137 mEq/L (ref 136–145)
Total Bilirubin: 0.63 mg/dL (ref 0.20–1.20)
Total Protein: 6.5 g/dL (ref 6.4–8.3)

## 2013-03-20 LAB — CBC WITH DIFFERENTIAL/PLATELET
BASO%: 0.2 % (ref 0.0–2.0)
EOS%: 0 % (ref 0.0–7.0)
HCT: 32.5 % — ABNORMAL LOW (ref 34.8–46.6)
LYMPH%: 6.3 % — ABNORMAL LOW (ref 14.0–49.7)
MCH: 31.2 pg (ref 25.1–34.0)
MCHC: 34.1 g/dL (ref 31.5–36.0)
NEUT%: 88.4 % — ABNORMAL HIGH (ref 38.4–76.8)
Platelets: 275 10*3/uL (ref 145–400)
RBC: 3.55 10*6/uL — ABNORMAL LOW (ref 3.70–5.45)
WBC: 16.1 10*3/uL — ABNORMAL HIGH (ref 3.9–10.3)

## 2013-03-20 MED ORDER — DEXTROSE 5 % IV SOLN
75.0000 mg/m2 | Freq: Once | INTRAVENOUS | Status: AC
  Administered 2013-03-20: 130 mg via INTRAVENOUS
  Filled 2013-03-20: qty 13

## 2013-03-20 MED ORDER — DEXAMETHASONE 4 MG PO TABS: 8.0000 mg | ORAL_TABLET | Freq: Two times a day (BID) | ORAL | Status: DC

## 2013-03-20 MED ORDER — SODIUM CHLORIDE 0.9 % IV SOLN
420.0000 mg | Freq: Once | INTRAVENOUS | Status: AC
  Administered 2013-03-20: 420 mg via INTRAVENOUS
  Filled 2013-03-20: qty 14

## 2013-03-20 MED ORDER — LORAZEPAM 2 MG/ML IJ SOLN
0.5000 mg | Freq: Once | INTRAMUSCULAR | Status: AC
  Administered 2013-03-20: 0.5 mg via INTRAVENOUS

## 2013-03-20 MED ORDER — DIPHENHYDRAMINE HCL 25 MG PO CAPS
50.0000 mg | ORAL_CAPSULE | Freq: Once | ORAL | Status: AC
  Administered 2013-03-20: 50 mg via ORAL

## 2013-03-20 MED ORDER — SODIUM CHLORIDE 0.9 % IJ SOLN
10.0000 mL | INTRAMUSCULAR | Status: DC | PRN
  Filled 2013-03-20: qty 10

## 2013-03-20 MED ORDER — DEXAMETHASONE SODIUM PHOSPHATE 20 MG/5ML IJ SOLN
20.0000 mg | Freq: Once | INTRAMUSCULAR | Status: AC
  Administered 2013-03-20: 20 mg via INTRAVENOUS

## 2013-03-20 MED ORDER — ACETAMINOPHEN 325 MG PO TABS
650.0000 mg | ORAL_TABLET | Freq: Once | ORAL | Status: AC
  Administered 2013-03-20: 650 mg via ORAL

## 2013-03-20 MED ORDER — LORAZEPAM 0.5 MG PO TABS: 0.5000 mg | ORAL_TABLET | Freq: Four times a day (QID) | ORAL | Status: DC | PRN

## 2013-03-20 MED ORDER — ONDANSETRON 16 MG/50ML IVPB (CHCC)
16.0000 mg | Freq: Once | INTRAVENOUS | Status: AC
  Administered 2013-03-20: 16 mg via INTRAVENOUS

## 2013-03-20 MED ORDER — HEPARIN SOD (PORK) LOCK FLUSH 100 UNIT/ML IV SOLN
500.0000 [IU] | Freq: Once | INTRAVENOUS | Status: DC | PRN
  Filled 2013-03-20: qty 5

## 2013-03-20 MED ORDER — TRASTUZUMAB CHEMO INJECTION 440 MG
6.0000 mg/kg | Freq: Once | INTRAVENOUS | Status: AC
  Administered 2013-03-20: 399 mg via INTRAVENOUS
  Filled 2013-03-20: qty 19

## 2013-03-20 MED ORDER — SODIUM CHLORIDE 0.9 % IV SOLN
695.4000 mg | Freq: Once | INTRAVENOUS | Status: AC
  Administered 2013-03-20: 700 mg via INTRAVENOUS
  Filled 2013-03-20: qty 70

## 2013-03-20 MED ORDER — SODIUM CHLORIDE 0.9 % IV SOLN
Freq: Once | INTRAVENOUS | Status: AC
  Administered 2013-03-20: 13:00:00 via INTRAVENOUS

## 2013-03-20 NOTE — Progress Notes (Signed)
OFFICE PROGRESS NOTE  CC  Jasmine Belling, MD [address] [place] [address] Dr. Lurline Hare  Dr. 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 At that time she had mammogram performed and it was negative. However her breast continue to increase in size. Initially patient felt that it could have been cysts or something on and therefore she did not go to a physician. However subsequently patient's breast became quite painful and she also began to feel in knot under her right arm.because of this she went to her physician who did a physical exam and a mammogram was performed on 01/01/2013 the mammogram showed diffuse skin thickening generalized increased density within the right breast and enlarged right axillary lymph nodes. She went on to have an ultrasound of the right breast performed that showed a large spiculated hypoechoic mass in the right breast 11:00 2 cm from the nipple measuring at least 4.5 cm in size. There were abnormal lymph nodes with thickened cortex and the right axilla the largest one measuring 3.1 cm in dimension.  #2Patient was recommended ultrasound guided core biopsy of the right breast mass and the right axilla lymph node. The biopsy was performed on 01/01/2013. The right needle core biopsy of the primary mass showed invasive mammary carcinoma grade 2-3 was invasive ductal. The lymph node of the right axilla was positive for metastatic disease. The tumor was ER PR negative HER-2/neu positive with a Ki-67 of 76%. On 01/09/2013 patient had MRI of the breasts performed that showed diffuse right breast skin thickening and edema with right axillary lymph node. There was a 5 mm internal mammary lymph node that  was incompletely imaged. The right breast mass measured 8 x 5 x 4.7 cm. There were abnormal areas involving all 4 quadrants. There was however no involvement of the pectoralis minor for lower chest wall. The left breast showed 2 areas of nodular enhancement these were biopsied and were fibroadenomas  #3 it was recommended that patient begin neoadjuvant chemotherapy consisting of Taxotere carboplatinum Herceptin and perjeta. This would be given every 3 weeks with day 2 Neulasta. A total of 6 cycles of therapy were planned. Once she completes the treatment then she would proceed with MRI of the breasts again for evaluation a response to therapy and eventually a mastectomy.  #4 patient began Neoadjuvant with curative intentTaxotere carboplatinum Herceptin and perjeta starting 01/16/2013.  CURRENT THERAPY: Neoadjuvant cycle 4 TCH/perjeta with day 2 Neulasta   INTERVAL HISTORY: Jasmine Schwartz 47 y.o. female returns for followup visit today overall she is doing well she is without any complaints. Overall she's doing well she is tolerating her chemotherapy very nicely without any problems. Her blood work looks Community education officer. She denies any nausea vomiting fevers chills night sweats headaches shortness of breath chest pains or palpitations no peripheral paresthesias. No myalgias and arthralgias. Remainder of the 10 point review of systems is negative.  MEDICAL HISTORY: Past Medical History  Diagnosis Date  . Anxiety   . Depression   . Dyslipidemia   . Anxiety and depression 07/06/2011  . Breast cancer 01/01/13    /metastatic 1/1 lymph nodes  . Diabetes mellitus     IDDM  . Hx of insertion of insulin  pump     ALLERGIES:  has No Known Allergies.  MEDICATIONS:  Current Outpatient Prescriptions  Medication Sig Dispense Refill  . ALPRAZolam (XANAX) 0.25 MG tablet Take 0.25 mg by mouth 3 (three) times daily as needed for sleep or anxiety.      . Alum & Mag Hydroxide-Simeth (MAGIC MOUTHWASH  W/LIDOCAINE) SOLN Take 5 mLs by mouth 4 (four) times daily as needed. Swish and spit.  120 mL  1  . dexamethasone (DECADRON) 4 MG tablet Take 8 mg by mouth 2 (two) times daily with a meal. Take two times a day the day before Taxotere. Then take two times a day starting the day after chemo for 3 days.      Marland Kitchen glucose blood (NOVA MAX TEST) test strip Use as directed up to 8 times a day      . Insulin Infusion Pump (MINIMED INSULIN PUMP) DEVI Inject 1 Units/hr into the vein continuous. Pt give extra per cards at meal times. Basic meals are about 5 units      . Insulin Infusion Pump Supplies (MINIMED INFUSION SET-MMT 397) MISC 1 Device by Does not apply route every 3 (three) days.      . Insulin Infusion Pump Supplies (PARADIGM PUMP RESERVOIR 1.76ML) MISC 1 Device by Does not apply route every 3 (three) days.      . insulin lispro (HUMALOG) 100 UNIT/ML injection Inject as directed into Insulin pump, total of 40 units/day      . lidocaine-prilocaine (EMLA) cream Apply topically as needed. Apply to port-a-cath site 1-2 hours before treatment.  30 g  3  . LORazepam (ATIVAN) 0.5 MG tablet Take 0.5 mg by mouth every 6 (six) hours as needed (Nausea or vomiting).      . prochlorperazine (COMPAZINE) 10 MG tablet Take 1 tablet (10 mg total) by mouth every 6 (six) hours as needed (Nausea or vomiting).  30 tablet  1  . sucralfate (CARAFATE) 1 GM/10ML suspension Take 10 mLs (1 g total) by mouth 4 (four) times daily.  420 mL  6  . amoxicillin (AMOXIL) 875 MG tablet       . fluconazole (DIFLUCAN) 200 MG tablet Take 1 tablet (200 mg total) by mouth daily.  5 tablet  5  . ondansetron (ZOFRAN) 8 MG tablet Take 8 mg by mouth 2 (two) times daily. Take two times a day starting the day after chemo for 3 days. Then take two times a day as needed for nausea or vomiting.      . prochlorperazine (COMPAZINE) 25 MG suppository Place 25 mg rectally every 12 (twelve) hours as needed for nausea.      . valACYclovir (VALTREX) 500 MG  tablet Take 1 tablet (500 mg total) by mouth 2 (two) times daily.  30 tablet  4   No current facility-administered medications for this visit.    SURGICAL HISTORY:  Past Surgical History  Procedure Laterality Date  . Lumbar spine surgery  2009    Dr Jillyn Hidden  . Cesarean section  1989  . Tubal ligation  1991  . Breast biopsy Right 01/01/13    Invasive mammary ca,metastatic in 1/1 lymph node  . Breast biopsy Left 01/09/13    Fibroadenoma,microcalcifications    REVIEW OF SYSTEMS:  Pertinent items are noted in HPI.   HEALTH MAINTENANCE:  PHYSICAL EXAMINATION: Blood pressure 122/80, pulse 101, temperature 98 F (36.7 C), temperature source Oral, resp. rate 18, height 5\' 4"  (1.626 m), weight 142 lb 9.6 oz (64.683 kg).  Body mass index is 24.47 kg/(m^2). ECOG PERFORMANCE STATUS: 0 - Asymptomatic   General appearance: alert, cooperative and appears stated age Resp: clear to auscultation bilaterally Cardio: regular rate and rhythm GI: soft, non-tender; bowel sounds normal; no masses,  no organomegaly Extremities: extremities normal, atraumatic, no cyanosis or edema Neurologic: Grossly normal Right breast reveals a large palpable mass without any nipple discharge it is softer Left breast no masses or nipple discharge  LABORATORY DATA: Lab Results  Component Value Date   WBC 16.1* 03/20/2013   HGB 11.1* 03/20/2013   HCT 32.5* 03/20/2013   MCV 91.6 03/20/2013   PLT 275 03/20/2013      Chemistry      Component Value Date/Time   NA 135* 03/06/2013 1105   NA 140 03/31/2012 0934   K 4.4 03/06/2013 1105   K 4.7 03/31/2012 0934   CL 99 01/23/2013 1251   CL 106 03/31/2012 0934   CO2 28 03/06/2013 1105   CO2 27 03/31/2012 0934   BUN 13.8 03/06/2013 1105   BUN 13 03/31/2012 0934   CREATININE 0.8 03/06/2013 1105   CREATININE 0.6 03/31/2012 0934      Component Value Date/Time   CALCIUM 9.5 03/06/2013 1105   CALCIUM 8.9 03/31/2012 0934   ALKPHOS 103 03/06/2013 1105   ALKPHOS 42 03/31/2012 0934   AST 12  03/06/2013 1105   AST 21 03/31/2012 0934   ALT 15 03/06/2013 1105   ALT 15 03/31/2012 0934   BILITOT 0.68 03/06/2013 1105   BILITOT 0.7 03/31/2012 0934       RADIOGRAPHIC STUDIES:  Ct Chest W Contrast  01/20/2013   *RADIOLOGY REPORT*  Clinical Data:  High risk breast cancer.  Evaluate for metastatic disease.  CT CHEST, ABDOMEN AND PELVIS WITH CONTRAST  Technique:  Multidetector CT imaging of the chest, abdomen and pelvis was performed following the standard protocol during bolus administration of intravenous contrast.  Contrast: OMNIPAQUE IOHEXOL 300 MG/ML  SOLN  Comparison:  None  CT CHEST  Findings:  Lungs/pleura: There is no pleural effusion.  No suspicious pulmonary nodule or mass noted.  Heart/Mediastinum: Heart size is normal.  No pericardial effusion. No mediastinal or hilar adenopathy.  Bones/Musculoskeletal:  Asymmetric increased soft tissue within the right breast parenchyma corresponding to patient's biopsy-proven invasive breast carcinoma.  There is associated overlying skin thickening.  Multiple enlarged right axillary lymph nodes are identified.  Index lymph node measures 1.7 cm, image 23/series 2. Several enhancing, sub centimeter retropectoral lymph nodes are identified.  IMPRESSION:  1.  No acute findings. 2.  Right breast invasive carcinoma with associated right axillary adenopathy.  CT ABDOMEN AND PELVIS  Findings:  There are no focal liver abnormalities identified.  The gallbladder appears normal.  There is no biliary dilatation.  The pancreas is unremarkable.  Normal appearance of the spleen.  The adrenal glands are both normal.  The kidneys are unremarkable. Urinary bladder appears normal.  The uterus is unremarkable. Corpus luteal cyst is noted in the left ovary measuring 1.6 cm.  No free fluid or fluid collections identified within the upper abdomen or pelvis.  The abdominal aorta has a normal caliber. There is no adenopathy identified within the upper abdomen.  No pelvic or  inguinal adenopathy noted.  The stomach is normal.  The small bowel loops are unremarkable. Normal appearance of the colon.  Review of the visualized osseous structures is negative for aggressive lytic or sclerotic bone lesion.  IMPRESSION:  1.  No acute findings within  the abdomen or pelvis. 2.  No mass or adenopathy.   Original Report Authenticated By: Signa Kell, M.D.   US Breast Left  01/09/2013   *RADIOLOGY REPORT*  Clinical Data:  47 year old female with newly diagnosed right breast cancer.  Left mammogram to compare to presurgical MRI.  DIGITAL DIAGNOSTIC LEFT MAMMOGRAM WITH CAD AND LEFT BREAST ULTRASOUND:  Comparison:  06/11/2012 mammogram and 01/09/2013 MRI  Findings:  ACR Breast Density Category 3: The breast tissue is heterogeneously dense.  A possible circumscribed mass in the upper outer left breast is identified. No suspicious calcifications or distortion identified.  Mammographic images were processed with CAD.  On physical exam, no palpable abnormalities identified within the upper left breast.  Ultrasound is performed, showing a 6 x 6 x 8 mm slightly irregular hypoechoic mass at the 11 o'clock position of the left breast 5 cm from the nipple An 11 x 7 x 9 mm slightly irregular hypoechoic mass at the 2 o'clock position of the left breast 4 cm from the nipple is also present. No enlarged or abnormal appearing left axillary lymph nodes are identified.  IMPRESSION: Indeterminate 6 x 6 x 8 mm hypoechoic mass at the 11 o'clock position of the left breast and 11 x 7 x 9 mm hypoechoic mass in the 2 o'clock position of the left breast.  The 11 o'clock position mass probably represents the enhancing nodule identified on today's MRI.  Tissue sampling of both masses is recommended.  BI-RADS CATEGORY 4:  Suspicious abnormality - biopsy should be considered.  RECOMMENDATION: Ultrasound guided biopsies of both left breast masses.  The findings and recommendations were discussed with the patient and she  desires to proceed with ultrasound guided left breast biopsies. These biopsies will be performed today but dictated in a separate report.  Results were also provided in writing at the conclusion of the visit.  If applicable, a reminder letter will be sent to the patient regarding the next appointment.   Original Report Authenticated By: Harmon Pier, M.D.   Ct Abdomen Pelvis W Contrast  01/22/2013   *RADIOLOGY REPORT*  Clinical Data:  High risk breast cancer.  Evaluate for metastatic disease.  CT CHEST, ABDOMEN AND PELVIS WITH CONTRAST  Technique:  Multidetector CT imaging of the chest, abdomen and pelvis was performed following the standard protocol during bolus administration of intravenous contrast.  Contrast: OMNIPAQUE IOHEXOL 300 MG/ML  SOLN  Comparison:  None  CT CHEST  Findings:  Lungs/pleura: There is no pleural effusion.  No suspicious pulmonary nodule or mass noted.  Heart/Mediastinum: Heart size is normal.  No pericardial effusion. No mediastinal or hilar adenopathy.  Bones/Musculoskeletal:  Asymmetric increased soft tissue within the right breast parenchyma corresponding to patient's biopsy-proven invasive breast carcinoma.  There is associated overlying skin thickening.  Multiple enlarged right axillary lymph nodes are identified.  Index lymph node measures 1.7 cm, image 23/series 2. Several enhancing, sub centimeter retropectoral lymph nodes are identified.  IMPRESSION:  1.  No acute findings. 2.  Right breast invasive carcinoma with associated right axillary adenopathy.  CT ABDOMEN AND PELVIS  Findings:  There are no focal liver abnormalities identified.  The gallbladder appears normal.  There is no biliary dilatation.  The pancreas is unremarkable.  Normal appearance of the spleen.  The adrenal glands are both normal.  The kidneys are unremarkable. Urinary bladder appears normal.  The uterus is unremarkable. Corpus luteal cyst is noted in the left ovary measuring 1.6 cm.  No free fluid or  fluid collections identified within the upper abdomen or pelvis.  The abdominal aorta has a normal caliber. There is no adenopathy identified within the upper abdomen.  No pelvic or inguinal adenopathy noted.  The stomach is normal.  The small bowel loops are unremarkable. Normal appearance of the colon.  Review of the visualized osseous structures is negative for aggressive lytic or sclerotic bone lesion.  IMPRESSION:  1.  No acute findings within the abdomen or pelvis. 2.  No mass or adenopathy.   Original Report Authenticated By: Signa Kell, M.D.   Mr Breast Bilateral W Wo Contrast  01/09/2013   *RADIOLOGY REPORT*  Clinical Data: Biopsy-proven right breast inflammatory carcinoma with primary invasive mammary carcinoma and previous biopsy and known right axillary metastatic disease.  Preoperative staging MRI.  BUN and creatinine were obtained on site at [facility] at [address] Results:  BUN 10 mg/dL,  Creatinine 0.9 mg/dL.  BILATERAL BREAST MRI WITH AND WITHOUT CONTRAST  Technique: Multiplanar, multisequence MR images of both breasts were obtained prior to and following the intravenous administration of 13ml of Multihance.  Three dimensional images were evaluated at the independent DynaCad workstation.  Comparison:  Prior mammograms and ultrasound.  The patient underwent diagnostic mammography and ultrasound of the left breast today as no recent prior exam was available after the recent diagnosis in the right breast.  Findings: There is diffuse right breast skin thickening and edema, trabecular edema, and right axillary lymphadenopathy with maximal nodal measurement 3.1 cm short-axis diameter.  There is also a 5 mm internal mammary artery chain lymph node identified image 26 series 2. There is an incompletely visualized lymph node subjacent to the pectoralis minor muscle on the first image of the T2-weighted series.  There is a large irregular enhancing mass in the right breast 11 o'clock  location with associated clip artifact measuring maximally 8.0 x 5.6 by 4.7 cm. This demonstrates washin/washout type enhancement kinetics.  This corresponds to the biopsy-proven invasive mammary carcinoma.  There are abnormal nodular areas of trabecular has been throughout the right breast involving all four quadrants. No abnormal underlying pectoralis enhancement or chest wall involvement is identified.  In the left breast 11 o'clock location, there is an 8 mm enhancing nodule corresponding to that seen at the examination earlier today. This area demonstrates plateau type enhancement kinetics.  No left- sided axillary or internal mammary artery chain lymphadenopathy is identified.  There is mild inhomogeneous nodular enhancement in the left breast two o'clock location at the site of the mass seen in this location earlier today, measuring 9 mm, measuring progressive enhancement kinetics.  IMPRESSION: Large irregular enhancing right breast mass 11 o'clock location corresponding to biopsy-proven invasive mammary carcinoma, with metastatic right axillary lymphadenopathy as well as skin and trabecular thickening/enhancement compatible with the clinical diagnosis of inflammatory and/or locally advanced breast cancer.  5 mm abnormal right internal mammary artery chain lymph node.  8 mm enhancing mass left breast 11 o'clock location and mildly irregular area of mass-like enhancement in the left breast two o'clock location, corresponding to those seen at diagnostic examination earlier today.  Biopsy has been scheduled for today and will be dictated separately.  RECOMMENDATION: Treatment plan  THREE-DIMENSIONAL MR IMAGE RENDERING ON INDEPENDENT WORKSTATION:  Three-dimensional MR images were rendered by post-processing of the original MR data on an independent workstation.  The three- dimensional MR images were interpreted, and findings were reported in the accompanying complete MRI report for this study.  BI-RADS CATEGORY  6:  Known biopsy-proven malignancy - appropriate action should be taken.   Original Report Authenticated By: Christiana Pellant, M.D.   Nm Pet Image Initial (pi) Skull Base To Thigh  01/20/2013   *RADIOLOGY REPORT*  Clinical Data: Initial treatment strategy for breast cancer.  NUCLEAR MEDICINE PET SKULL BASE TO THIGH  Fasting Blood Glucose:  263  Technique:  19.1 mCi F-18 FDG was injected intravenously. CT data was obtained and used for attenuation correction and anatomic localization only.  (This was not acquired as a diagnostic CT examination.) Additional exam technical data entered on technologist worksheet.  Comparison:  None  Findings:  Neck: No hypermetabolic lymph nodes in the neck.  Chest:  No hypermetabolic mediastinal or hilar nodes.  No suspicious pulmonary nodules on the CT scan. Enlarged right axillary lymph nodes identified.  Index lymph node measures 1.7 cm and has an SUV max equal to 2.8, image 77.  There is asymmetric increased uptake throughout the right breast with associated overlying skin thickening.  This has an SUV max equal to 4.4, image 101.  Abdomen/Pelvis:  No abnormal hypermetabolic activity within the liver, pancreas, adrenal glands, or spleen.  No hypermetabolic lymph nodes in the abdomen or pelvis. There is a small focus of increased FDG uptake within the left ovary.  This has an SUV max equal to 13.1, image 217.  Skeleton:  No focal hypermetabolic activity to suggest skeletal metastasis.  IMPRESSION:  1.  Asymmetric increased uptake associated with the right breast and right breast skin thickening corresponding to biopsy-proven invasive breast cancer. 2.  Enlarged and hypermetabolic right axillary lymph nodes consistent with metastatic adenopathy. 3.  No evidence for distant metastatic disease. 4.  Focal area of increased uptake within the left axilla.  The patient is noted to have a corpus luteal cyst are not diagnostic CT images.  This activity is favored to represent physiologic  activity.  Consider confirmatory imaging with pelvic sonogram.   Original Report Authenticated By: Signa Kell, M.D.   Ir Fluoro Guide Cv Line Left  01/15/2013   *RADIOLOGY REPORT*  Clinical Data/Indication: Breast cancer  TUNNEL POWER PORT PLACEMENT WITH SUBCUTANEOUS POCKET UTILIZING ULTRASOUND & FLOUROSCOPY  Sedation: Versed 5.0 mg, Fentanyl 250 mcg.  Total Moderate Sedation Time: 35 minutes.  As antibiotic prophylaxis, Ancef  was ordered pre-procedure and administered intravenously within one hour of incision.  Fluoroscopy Time: 36 seconds.  Procedure:  After written informed consent was obtained, patient was placed in the supine position on angiographic table. The left neck and chest was prepped and draped in a sterile fashion. Lidocaine was utilized for local anesthesia.  The left internal jugular vein was noted to be patent initially with ultrasound. Under sonographic guidance, a micropuncture needle was inserted into the left IJ vein (Ultrasound and fluoroscopic image documentation was performed). The needle was removed over an 018 wire which was exchanged for a Amplatz.  This was advanced into the IVC.  An 8-French dilator was advanced over the Amplatz.  A small incision was made in the left upper chest over the anterior left second rib.  Utilizing blunt dissection, a subcutaneous pocket was created in the caudal direction. The pocket was irrigated with a copious amount of sterile normal saline.  The port catheter was tunneled from the chest incision, and out the neck incision.  The reservoir was inserted into the subcutaneous pocket and secured with two 3-0 Ethilon stitches.  A peel-away sheath was advanced over the Amplatz wire.  The port catheter was cut to measure  length and inserted through the peel-away sheath.  The peel-away sheath was removed.  The chest incision was closed with 3-0 Vicryl interrupted stitches for the subcutaneous tissue and a running of 4- 0 Vicryl subcuticular stitch for the  skin.  The neck incision was closed with a 4-0 Vicryl subcuticular stitch.  Derma-bond was applied to both surgical incisions.  The port reservoir was flushed and instilled with heparinized saline.  No complications.  Findings:  A left IJ vein Port-A-Cath is in place with its tip at the cavoatrial junction.  IMPRESSION: Successful 8 French left internal jugular vein power port placement with its tip at the SVC/RA junction.   Original Report Authenticated By: Jolaine Click, M.D.   Ir US Guide Vasc Access Left  01/15/2013   *RADIOLOGY REPORT*  Clinical Data/Indication: Breast cancer  TUNNEL POWER PORT PLACEMENT WITH SUBCUTANEOUS POCKET UTILIZING ULTRASOUND & FLOUROSCOPY  Sedation: Versed 5.0 mg, Fentanyl 250 mcg.  Total Moderate Sedation Time: 35 minutes.  As antibiotic prophylaxis, Ancef  was ordered pre-procedure and administered intravenously within one hour of incision.  Fluoroscopy Time: 36 seconds.  Procedure:  After written informed consent was obtained, patient was placed in the supine position on angiographic table. The left neck and chest was prepped and draped in a sterile fashion. Lidocaine was utilized for local anesthesia.  The left internal jugular vein was noted to be patent initially with ultrasound. Under sonographic guidance, a micropuncture needle was inserted into the left IJ vein (Ultrasound and fluoroscopic image documentation was performed). The needle was removed over an 018 wire which was exchanged for a Amplatz.  This was advanced into the IVC.  An 8-French dilator was advanced over the Amplatz.  A small incision was made in the left upper chest over the anterior left second rib.  Utilizing blunt dissection, a subcutaneous pocket was created in the caudal direction. The pocket was irrigated with a copious amount of sterile normal saline.  The port catheter was tunneled from the chest incision, and out the neck incision.  The reservoir was inserted into the subcutaneous pocket and secured  with two 3-0 Ethilon stitches.  A peel-away sheath was advanced over the Amplatz wire.  The port catheter was cut to measure length and inserted through the peel-away sheath.  The peel-away sheath was removed.  The chest incision was closed with 3-0 Vicryl interrupted stitches for the subcutaneous tissue and a running of 4- 0 Vicryl subcuticular stitch for the skin.  The neck incision was closed with a 4-0 Vicryl subcuticular stitch.  Derma-bond was applied to both surgical incisions.  The port reservoir was flushed and instilled with heparinized saline.  No complications.  Findings:  A left IJ vein Port-A-Cath is in place with its tip at the cavoatrial junction.  IMPRESSION: Successful 8 French left internal jugular vein power port placement with its tip at the SVC/RA junction.   Original Report Authenticated By: Jolaine Click, M.D.   Mm Digital Diagnostic Unilat L  01/09/2013   *RADIOLOGY REPORT*  Clinical Data:  Evaluate clip placement following two separate ultrasound-guided left breast biopsies.  DIGITAL DIAGNOSTIC LEFT MAMMOGRAM  Comparison:  Previous exams  Findings:  Films are performed following two separate ultrasound guided biopsies of the left breast - at the 11 o'clock position and 2 o'clock position. The ribbon shaped biopsy clip corresponds to the 11 o'clock mass and the T shaped biopsy clip corresponds to the 2 o'clock mass. No immediate complications are identified.  IMPRESSION: Satisfactory clip placement following  ultrasound guided left breast biopsies.  Pathology will be followed.   Original Report Authenticated By: Harmon Pier, M.D.   Mm Digital Diagnostic Unilat L  01/09/2013   *RADIOLOGY REPORT*  Clinical Data:  47 year old female with newly diagnosed right breast cancer.  Left mammogram to compare to presurgical MRI.  DIGITAL DIAGNOSTIC LEFT MAMMOGRAM WITH CAD AND LEFT BREAST ULTRASOUND:  Comparison:  06/11/2012 mammogram and 01/09/2013 MRI  Findings:  ACR Breast Density Category 3: The  breast tissue is heterogeneously dense.  A possible circumscribed mass in the upper outer left breast is identified. No suspicious calcifications or distortion identified.  Mammographic images were processed with CAD.  On physical exam, no palpable abnormalities identified within the upper left breast.  Ultrasound is performed, showing a 6 x 6 x 8 mm slightly irregular hypoechoic mass at the 11 o'clock position of the left breast 5 cm from the nipple An 11 x 7 x 9 mm slightly irregular hypoechoic mass at the 2 o'clock position of the left breast 4 cm from the nipple is also present. No enlarged or abnormal appearing left axillary lymph nodes are identified.  IMPRESSION: Indeterminate 6 x 6 x 8 mm hypoechoic mass at the 11 o'clock position of the left breast and 11 x 7 x 9 mm hypoechoic mass in the 2 o'clock position of the left breast.  The 11 o'clock position mass probably represents the enhancing nodule identified on today's MRI.  Tissue sampling of both masses is recommended.  BI-RADS CATEGORY 4:  Suspicious abnormality - biopsy should be considered.  RECOMMENDATION: Ultrasound guided biopsies of both left breast masses.  The findings and recommendations were discussed with the patient and she desires to proceed with ultrasound guided left breast biopsies. These biopsies will be performed today but dictated in a separate report.  Results were also provided in writing at the conclusion of the visit.  If applicable, a reminder letter will be sent to the patient regarding the next appointment.   Original Report Authenticated By: Harmon Pier, M.D.   [place] Lt Breast Bx W Loc Dev 1st Lesion Img Bx Spec US Guide  01/12/2013   *RADIOLOGY REPORT*  Clinical Data:  47 year old female with newly diagnosed right breast cancer.  Two left breast masses identified sonographically - 6 x 8 mm at the 11 o'clock position and 9 x 11 mm at the 2 o'clock position. For tissue sampling of the 6 x 8 mm mass at the 11 o'clock position.   ULTRASOUND GUIDED VACUUM ASSISTED CORE BIOPSY OF THE LEFT BREAST  Comparison: Previous exams.  I met with the patient and we discussed the procedure of ultrasound- guided biopsy, including benefits and alternatives.  We discussed the high likelihood of a successful procedure. We discussed the risks of the procedure including infection, bleeding, tissue injury, clip migration, and inadequate sampling.  Informed written consent was given.  Using sterile technique and 2% Lidocaine as local anesthetic, under direct ultrasound visualization, a12 gauge vacuum-assisteddevice was used to perform biopsy of the 6 x 8 mm hypoechoic mass at the 11 o'clock position of the left breast 5 cm from the nipple using a medial approach. At the conclusion of the procedure, a ribbon shaped tissue marker clip was deployed into the biopsy cavity. Follow-up 2-view mammogram was performed and dictated separately.  The usual time-out protocol was performed immediately prior to the procedure.  IMPRESSION: Ultrasound-guided biopsy of 6 x 8 mm hypoechoic mass at the 11 o'clock position of the left breast.  No  apparent complications.  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  Histology correlates with imaging findings.  The patient was contacted by phone on 01/12/2013 and these results given to her which she understood. Her questions were answered. The patient had no complaints with her biopsy site.  Recommend treatment plan.   Original Report Authenticated By: Harmon Pier, M.D.   [place] Lt Breast Bx W Loc Dev Ea Add Lesion Img Bx Spec US Guide  01/12/2013   *RADIOLOGY REPORT*  Clinical Data:  47 year old female with newly diagnosed right breast cancer.  Two left breast masses identified sonographically - 6 x 8 mm at the 11 o'clock position and 9 x 11 mm at the 2 o'clock position. For tissue sampling of the 9 x 11 mm mass at the 2 o'clock position.  ULTRASOUND GUIDED VACUUM ASSISTED CORE  BIOPSY OF THE LEFT BREAST  Comparison: Previous exams.  I met with the patient and we discussed the procedure of ultrasound- guided biopsy, including benefits and alternatives.  We discussed the high likelihood of a successful procedure. We discussed the risks of the procedure including infection, bleeding, tissue injury, clip migration, and inadequate sampling.  Informed written consent was given.  Using sterile technique and 2% Lidocaine as local anesthetic, under direct ultrasound visualization, a 12 gauge vacuum-assisteddevice was used to perform biopsy of the 9 x 11 mm hypoechoic mass at the 2 o'clock position of the left breast 4 cm from the nipple using a medial approach. At the conclusion of the procedure, a  tissue marker clip was deployed into the biopsy cavity.  Follow-up 2-view mammogram was performed and dictated separately.  The usual time-out protocol was performed immediately prior to the procedure.  IMPRESSION: Ultrasound-guided biopsy of 9 x 11 mm mass in the 2 o'clock position of the left breast.  No apparent complications.  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 Histology correlates with imaging findings.  The patient was contacted by phone on 01/12/2013 and these results given to her which she understood. Her questions were answered. The patient had no complaints with her biopsy site.  Recommend treatment plan.   Original Report Authenticated By: Harmon Pier, M.D.    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 Patient is receiving neoadjuvant chemotherapy and her to therapy. She is receiving double HER-2 therapy with Herceptin and perjeta. She receives chemotherapy consisting of Taxotere and carboplatinum. All drugs are given every 3 weeks with day 2 Neulasta. She began her treatment on  01/16/2013. Thus far she is tolerating it well.  #2 patient is set up to see the genetic counselor in the next few weeks since she does have  unde breast cancer under the age of 9. We again today discussed the rationale for referring her to genetic counseling. 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  #3 left lower extremity edema: Likely secondary to chemotherapy.  #4 nail bed changes: secondary to chemotherapy patient is reassured   PLAN:   #1 patient will proceed with cycle 4 of her TCH/perjeta.  #2 she will return in one week for followup and labs  All questions were answered. The patient knows to call the clinic with  any problems, questions or concerns. We can certainly see the patient much sooner if necessary.  I spent 25 minutes counseling the patient face to face. The total time spent in the appointment was 30 minutes.    Drue Second, MD Medical/Oncology [facility] [phone] (beeper) [phone] (Office)  03/20/2013, 12:40 PM

## 2013-03-20 NOTE — Patient Instructions (Addendum)
[facility] Discharge Instructions for Patients Receiving Chemotherapy  Today you received the following chemotherapy agents Taxotere,carboplatin To help prevent nausea and vomiting after your treatment, we encourage you to take your nausea medication Decadron, ondansetron, compazine   If you develop nausea and vomiting that is not controlled by your nausea medication, call the clinic.   BELOW ARE SYMPTOMS THAT SHOULD BE REPORTED IMMEDIATELY:  *FEVER GREATER THAN 100.5 F  *CHILLS WITH OR WITHOUT FEVER  NAUSEA AND VOMITING THAT IS NOT CONTROLLED WITH YOUR NAUSEA MEDICATION  *UNUSUAL SHORTNESS OF BREATH  *UNUSUAL BRUISING OR BLEEDING  TENDERNESS IN MOUTH AND THROAT WITH OR WITHOUT PRESENCE OF ULCERS  *URINARY PROBLEMS  *BOWEL PROBLEMS  UNUSUAL RASH Items with * indicate a potential emergency and should be followed up as soon as possible.  Feel free to call the clinic you have any questions or concerns. The clinic phone number is [phone].   Trastuzumab injection for infusion What is this medicine? TRASTUZUMAB (tras TOO zoo mab) is a monoclonal antibody. It targets a protein called HER2. This protein is found in some stomach and breast cancers. This medicine can stop cancer cell growth. This medicine may be used with other cancer treatments. This medicine may be used for other purposes; ask your health care provider or pharmacist if you have questions. What should I tell my health care provider before I take this medicine? They need to know if you have any of these conditions: -heart disease -heart failure -infection (especially a virus infection such as chickenpox, cold sores, or herpes) -lung or breathing disease, like asthma -recent or ongoing radiation therapy -an unusual or allergic reaction to trastuzumab, benzyl alcohol, or other medications, foods, dyes, or preservatives -pregnant or trying to get pregnant -breast-feeding How should I use this  medicine? This drug is given as an infusion into a vein. It is administered in a hospital or clinic by a specially trained health care professional. Talk to your pediatrician regarding the use of this medicine in children. This medicine is not approved for use in children. Overdosage: If you think you have taken too much of this medicine contact a poison control center or emergency room at once. NOTE: This medicine is only for you. Do not share this medicine with others. What if I miss a dose? It is important not to miss a dose. Call your doctor or health care professional if you are unable to keep an appointment. What may interact with this medicine? -cyclophosphamide -doxorubicin -warfarin This list may not describe all possible interactions. Give your health care provider a list of all the medicines, herbs, non-prescription drugs, or dietary supplements you use. Also tell them if you smoke, drink alcohol, or use illegal drugs. Some items may interact with your medicine. What should I watch for while using this medicine? Visit your doctor for checks on your progress. Report any side effects. Continue your course of treatment even though you feel ill unless your doctor tells you to stop. Call your doctor or health care professional for advice if you get a fever, chills or sore throat, or other symptoms of a cold or flu. Do not treat yourself. Try to avoid being around people who are sick. You may experience fever, chills and shaking during your first infusion. These effects are usually mild and can be treated with other medicines. Report any side effects during the infusion to your health care professional. Fever and chills usually do not happen with later infusions. What side  effects may I notice from receiving this medicine? Side effects that you should report to your doctor or other health care professional as soon as possible: -breathing difficulties -chest pain or  palpitations -cough -dizziness or fainting -fever or chills, sore throat -skin rash, itching or hives -swelling of the legs or ankles -unusually weak or tired Side effects that usually do not require medical attention (report to your doctor or other health care professional if they continue or are bothersome): -loss of appetite -headache -muscle aches -nausea This list may not describe all possible side effects. Call your doctor for medical advice about side effects. You may report side effects to FDA at 1-800-FDA-1088. Where should I keep my medicine? This drug is given in a hospital or clinic and will not be stored at home. NOTE: This sheet is a summary. It may not cover all possible information. If you have questions about this medicine, talk to your doctor, pharmacist, or health care provider.  2012, [employer]/Gold Standard. (06/03/2009 1:43:15 PM) Pertuzumab injection What is this medicine? PERTUZUMAB is a monoclonal antibody that targets a protein called HER2. HER2 is found in some breast cancers. This medicine can stop cancer cell growth. This medicine is used with other cancer treatments. This medicine may be used for other purposes; ask your health care provider or pharmacist if you have questions. What should I tell my health care provider before I take this medicine? They need to know if you have any of these conditions: -heart disease -heart failure -high blood pressure -history of irregular heart beat -recent or ongoing radiation therapy -an unusual or allergic reaction to pertuzumab, other medicines, foods, dyes, or preservatives -pregnant or trying to get pregnant -breast-feeding How should I use this medicine? This medicine is for infusion into a vein. It is given by a health care professional in a hospital or clinic setting. Talk to your pediatrician regarding the use of this medicine in children. Special care may be needed. Overdosage: If you think you've taken too  much of this medicine contact a poison control center or emergency room at once. Overdosage: If you think you have taken too much of this medicine contact a poison control center or emergency room at once. NOTE: This medicine is only for you. Do not share this medicine with others. What if I miss a dose? It is important not to miss your dose. Call your doctor or health care professional if you are unable to keep an appointment. What may interact with this medicine? Interactions are not expected. Give your health care provider a list of all the medicines, herbs, non-prescription drugs, or dietary supplements you use. Also tell them if you smoke, drink alcohol, or use illegal drugs. Some items may interact with your medicine. This list may not describe all possible interactions. Give your health care provider a list of all the medicines, herbs, non-prescription drugs, or dietary supplements you use. Also tell them if you smoke, drink alcohol, or use illegal drugs. Some items may interact with your medicine. What should I watch for while using this medicine? Your condition will be monitored carefully while you are receiving this medicine. Report any side effects. Continue your course of treatment even though you feel ill unless your doctor tells you to stop. Do not become pregnant while taking this medicine. Women should inform their doctor if they wish to become pregnant or think they might be pregnant. There is a potential for serious side effects to an unborn child. Talk to your  health care professional or pharmacist for more information. Do not breast-feed an infant while taking this medicine. Call your doctor or health care professional for advice if you get a fever, chills or sore throat, or other symptoms of a cold or flu. Do not treat yourself. Try to avoid being around people who are sick. You may experience fever, chills, and headache during the infusion. Report any side effects during the  infusion to your health care professional. What side effects may I notice from receiving this medicine? Side effects that you should report to your doctor or health care professional as soon as possible: -breathing problems -chest pain or palpitations -dizziness -feeling faint or lightheaded -fever or chills -skin rash, itching or hives -sore throat -swelling of the face, lips, or tongue -swelling of the legs or ankles -unusually weak or tired  Side effects that usually do not require medical attention (Report these to your doctor or health care professional if they continue or are bothersome.): -diarrhea -hair loss -nausea, vomiting -tiredness This list may not describe all possible side effects. Call your doctor for medical advice about side effects. You may report side effects to FDA at 1-800-FDA-1088. Where should I keep my medicine? This drug is given in a hospital or clinic and will not be stored at home. NOTE: This sheet is a summary. It may not cover all possible information. If you have questions about this medicine, talk to your doctor, pharmacist, or health care provider.  2013, [employer]/Gold Standard. (01/24/2011 4:45:56 PM)

## 2013-03-20 NOTE — Patient Instructions (Signed)
Proceed with chemotherapy   I will see you back in 1 week 

## 2013-03-21 ENCOUNTER — Ambulatory Visit (HOSPITAL_BASED_OUTPATIENT_CLINIC_OR_DEPARTMENT_OTHER): Payer: BC Managed Care – PPO

## 2013-03-21 VITALS — BP 111/67 | HR 84 | Temp 98.4°F | Resp 18

## 2013-03-21 DIAGNOSIS — C50911 Malignant neoplasm of unspecified site of right female breast: Secondary | ICD-10-CM

## 2013-03-21 DIAGNOSIS — Z5189 Encounter for other specified aftercare: Secondary | ICD-10-CM

## 2013-03-21 DIAGNOSIS — C50419 Malignant neoplasm of upper-outer quadrant of unspecified female breast: Secondary | ICD-10-CM

## 2013-03-21 MED ORDER — PEGFILGRASTIM INJECTION 6 MG/0.6ML
6.0000 mg | Freq: Once | SUBCUTANEOUS | Status: AC
  Administered 2013-03-21: 6 mg via SUBCUTANEOUS

## 2013-03-22 NOTE — Progress Notes (Signed)
OFFICE PROGRESS NOTE  CC  [name], MD [address] [place] [address] Dr. Lurline Hare  Dr. 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 At that time she had mammogram performed and it was negative. However her breast continue to increase in size. Initially patient felt that it could have been cysts or something on and therefore she did not go to a physician. However subsequently patient's breast became quite painful and she also began to feel in knot under her right arm.because of this she went to her physician who did a physical exam and a mammogram was performed on 01/01/2013 the mammogram showed diffuse skin thickening generalized increased density within the right breast and enlarged right axillary lymph nodes. She went on to have an ultrasound of the right breast performed that showed a large spiculated hypoechoic mass in the right breast 11:00 2 cm from the nipple measuring at least 4.5 cm in size. There were abnormal lymph nodes with thickened cortex and the right axilla the largest one measuring 3.1 cm in dimension.  #2Patient was recommended ultrasound guided core biopsy of the right breast mass and the right axilla lymph node. The biopsy was performed on 01/01/2013. The right needle core biopsy of the primary mass showed invasive mammary carcinoma grade 2-3 was invasive ductal. The lymph node of the right axilla was positive for metastatic disease. The tumor was ER PR negative HER-2/neu positive with a Ki-67 of 76%. On 01/09/2013 patient had MRI of the breasts performed that showed diffuse right breast skin thickening and edema with right axillary lymph node. There was a 5 mm internal mammary lymph node that  was incompletely imaged. The right breast mass measured 8 x 5 x 4.7 cm. There were abnormal areas involving all 4 quadrants. There was however no involvement of the pectoralis minor for lower chest wall. The left breast showed 2 areas of nodular enhancement these were biopsied and were fibroadenomas  #3 it was recommended that patient begin neoadjuvant chemotherapy consisting of Taxotere carboplatinum Herceptin and perjeta. This would be given every 3 weeks with day 2 Neulasta. A total of 6 cycles of therapy were planned. Once she completes the treatment then she would proceed with MRI of the breasts again for evaluation a response to therapy and eventually a mastectomy.  #4 patient began Taxotere carboplatinum Herceptin and perjeta starting 01/16/2013.  CURRENT THERAPY: Status post cycle 3 TCH/perjeta with day 2 Neulasta   INTERVAL HISTORY: Jasmine Schwartz 47 y.o. female returns for followup visit today overall she is doing well she is without any complaints. Overall she's doing well she is tolerating her chemotherapy very nicely without any problems. Her blood work looks Community education officer. She denies any nausea vomiting fevers chills night sweats headaches shortness of breath chest pains or palpitations no peripheral paresthesias. No myalgias and arthralgias. Remainder of the 10 point review of systems is negative.  MEDICAL HISTORY: Past Medical History  Diagnosis Date  . Anxiety   . Depression   . Dyslipidemia   . Anxiety and depression 07/06/2011  . Breast cancer 01/01/13    /metastatic 1/1 lymph nodes  . Diabetes mellitus     IDDM  . Hx of insertion of insulin pump  ALLERGIES:  has No Known Allergies.  MEDICATIONS:  Current Outpatient Prescriptions  Medication Sig Dispense Refill  . ALPRAZolam (XANAX) 0.25 MG tablet Take 0.25 mg by mouth 3 (three) times daily as needed for sleep or anxiety.      Marland Kitchen glucose blood (NOVA MAX TEST) test strip Use as directed up to 8 times a day      .  Insulin Infusion Pump (MINIMED INSULIN PUMP) DEVI Inject 1 Units/hr into the vein continuous. Pt give extra per cards at meal times. Basic meals are about 5 units      . Insulin Infusion Pump Supplies (MINIMED INFUSION SET-MMT 397) MISC 1 Device by Does not apply route every 3 (three) days.      . Insulin Infusion Pump Supplies (PARADIGM PUMP RESERVOIR 1.76ML) MISC 1 Device by Does not apply route every 3 (three) days.      . insulin lispro (HUMALOG) 100 UNIT/ML injection Inject as directed into Insulin pump, total of 40 units/day      . lidocaine-prilocaine (EMLA) cream Apply topically as needed. Apply to port-a-cath site 1-2 hours before treatment.  30 g  3  . ondansetron (ZOFRAN) 8 MG tablet Take 8 mg by mouth 2 (two) times daily. Take two times a day starting the day after chemo for 3 days. Then take two times a day as needed for nausea or vomiting.      . prochlorperazine (COMPAZINE) 10 MG tablet Take 1 tablet (10 mg total) by mouth every 6 (six) hours as needed (Nausea or vomiting).  30 tablet  1  . Alum & Mag Hydroxide-Simeth (MAGIC MOUTHWASH W/LIDOCAINE) SOLN Take 5 mLs by mouth 4 (four) times daily as needed. Swish and spit.  120 mL  1  . amoxicillin (AMOXIL) 875 MG tablet       . dexamethasone (DECADRON) 4 MG tablet Take 2 tablets (8 mg total) by mouth 2 (two) times daily with a meal. Take two times a day the day before Taxotere. Then take two times a day starting the day after chemo for 3 days.  40 tablet  3  . fluconazole (DIFLUCAN) 200 MG tablet Take 1 tablet (200 mg total) by mouth daily.  5 tablet  5  . LORazepam (ATIVAN) 0.5 MG tablet Take 1 tablet (0.5 mg total) by mouth every 6 (six) hours as needed (Nausea or vomiting).  30 tablet  2  . prochlorperazine (COMPAZINE) 25 MG suppository Place 25 mg rectally every 12 (twelve) hours as needed for nausea.      . sucralfate (CARAFATE) 1 GM/10ML suspension Take 10 mLs (1 g total) by mouth 4 (four) times daily.  420 mL  6  . valACYclovir  (VALTREX) 500 MG tablet Take 1 tablet (500 mg total) by mouth 2 (two) times daily.  30 tablet  4   No current facility-administered medications for this visit.    SURGICAL HISTORY:  Past Surgical History  Procedure Laterality Date  . Lumbar spine surgery  2009    Dr Jillyn Hidden  . Cesarean section  1989  . Tubal ligation  1991  . Breast biopsy Right 01/01/13    Invasive mammary ca,metastatic in 1/1 lymph node  . Breast biopsy Left 01/09/13    Fibroadenoma,microcalcifications    REVIEW OF SYSTEMS:  Pertinent items are noted in HPI.   HEALTH MAINTENANCE:  PHYSICAL EXAMINATION: Blood pressure 104/73, pulse 116, temperature 98 F (36.7 C), temperature source Oral, resp. rate 20, height 5\' 4"  (1.626 m), weight 133 lb 14.4  oz (60.737 kg). Body mass index is 22.97 kg/(m^2). ECOG PERFORMANCE STATUS: 0 - Asymptomatic   General appearance: alert, cooperative and appears stated age Resp: clear to auscultation bilaterally Cardio: regular rate and rhythm GI: soft, non-tender; bowel sounds normal; no masses,  no organomegaly Extremities: extremities normal, atraumatic, no cyanosis or edema Neurologic: Grossly normal Right breast reveals a large palpable mass without any nipple discharge it is softer Left breast no masses or nipple discharge  LABORATORY DATA: Lab Results  Component Value Date   WBC 16.1* 03/20/2013   HGB 11.1* 03/20/2013   HCT 32.5* 03/20/2013   MCV 91.6 03/20/2013   PLT 275 03/20/2013      Chemistry      Component Value Date/Time   NA 137 03/20/2013 1156   NA 140 03/31/2012 0934   K 4.1 03/20/2013 1156   K 4.7 03/31/2012 0934   CL 99 01/23/2013 1251   CL 106 03/31/2012 0934   CO2 23 03/20/2013 1156   CO2 27 03/31/2012 0934   BUN 17.5 03/20/2013 1156   BUN 13 03/31/2012 0934   CREATININE 0.9 03/20/2013 1156   CREATININE 0.6 03/31/2012 0934      Component Value Date/Time   CALCIUM 9.1 03/20/2013 1156   CALCIUM 8.9 03/31/2012 0934   ALKPHOS 71 03/20/2013 1156   ALKPHOS 42 03/31/2012 0934    AST 16 03/20/2013 1156   AST 21 03/31/2012 0934   ALT 19 03/20/2013 1156   ALT 15 03/31/2012 0934   BILITOT 0.63 03/20/2013 1156   BILITOT 0.7 03/31/2012 0934       RADIOGRAPHIC STUDIES:  Ct Chest W Contrast  01/20/2013   *RADIOLOGY REPORT*  Clinical Data:  High risk breast cancer.  Evaluate for metastatic disease.  CT CHEST, ABDOMEN AND PELVIS WITH CONTRAST  Technique:  Multidetector CT imaging of the chest, abdomen and pelvis was performed following the standard protocol during bolus administration of intravenous contrast.  Contrast: OMNIPAQUE IOHEXOL 300 MG/ML  SOLN  Comparison:  None  CT CHEST  Findings:  Lungs/pleura: There is no pleural effusion.  No suspicious pulmonary nodule or mass noted.  Heart/Mediastinum: Heart size is normal.  No pericardial effusion. No mediastinal or hilar adenopathy.  Bones/Musculoskeletal:  Asymmetric increased soft tissue within the right breast parenchyma corresponding to patient's biopsy-proven invasive breast carcinoma.  There is associated overlying skin thickening.  Multiple enlarged right axillary lymph nodes are identified.  Index lymph node measures 1.7 cm, image 23/series 2. Several enhancing, sub centimeter retropectoral lymph nodes are identified.  IMPRESSION:  1.  No acute findings. 2.  Right breast invasive carcinoma with associated right axillary adenopathy.  CT ABDOMEN AND PELVIS  Findings:  There are no focal liver abnormalities identified.  The gallbladder appears normal.  There is no biliary dilatation.  The pancreas is unremarkable.  Normal appearance of the spleen.  The adrenal glands are both normal.  The kidneys are unremarkable. Urinary bladder appears normal.  The uterus is unremarkable. Corpus luteal cyst is noted in the left ovary measuring 1.6 cm.  No free fluid or fluid collections identified within the upper abdomen or pelvis.  The abdominal aorta has a normal caliber. There is no adenopathy identified within the upper abdomen.  No pelvic or  inguinal adenopathy noted.  The stomach is normal.  The small bowel loops are unremarkable. Normal appearance of the colon.  Review of the visualized osseous structures is negative for aggressive lytic or sclerotic bone lesion.  IMPRESSION:  1.  No  acute findings within the abdomen or pelvis. 2.  No mass or adenopathy.   Original Report Authenticated By: Signa Kell, M.D.   US Breast Left  01/09/2013   *RADIOLOGY REPORT*  Clinical Data:  47 year old female with newly diagnosed right breast cancer.  Left mammogram to compare to presurgical MRI.  DIGITAL DIAGNOSTIC LEFT MAMMOGRAM WITH CAD AND LEFT BREAST ULTRASOUND:  Comparison:  06/11/2012 mammogram and 01/09/2013 MRI  Findings:  ACR Breast Density Category 3: The breast tissue is heterogeneously dense.  A possible circumscribed mass in the upper outer left breast is identified. No suspicious calcifications or distortion identified.  Mammographic images were processed with CAD.  On physical exam, no palpable abnormalities identified within the upper left breast.  Ultrasound is performed, showing a 6 x 6 x 8 mm slightly irregular hypoechoic mass at the 11 o'clock position of the left breast 5 cm from the nipple An 11 x 7 x 9 mm slightly irregular hypoechoic mass at the 2 o'clock position of the left breast 4 cm from the nipple is also present. No enlarged or abnormal appearing left axillary lymph nodes are identified.  IMPRESSION: Indeterminate 6 x 6 x 8 mm hypoechoic mass at the 11 o'clock position of the left breast and 11 x 7 x 9 mm hypoechoic mass in the 2 o'clock position of the left breast.  The 11 o'clock position mass probably represents the enhancing nodule identified on today's MRI.  Tissue sampling of both masses is recommended.  BI-RADS CATEGORY 4:  Suspicious abnormality - biopsy should be considered.  RECOMMENDATION: Ultrasound guided biopsies of both left breast masses.  The findings and recommendations were discussed with the patient and she  desires to proceed with ultrasound guided left breast biopsies. These biopsies will be performed today but dictated in a separate report.  Results were also provided in writing at the conclusion of the visit.  If applicable, a reminder letter will be sent to the patient regarding the next appointment.   Original Report Authenticated By: Harmon Pier, M.D.   Ct Abdomen Pelvis W Contrast  01/22/2013   *RADIOLOGY REPORT*  Clinical Data:  High risk breast cancer.  Evaluate for metastatic disease.  CT CHEST, ABDOMEN AND PELVIS WITH CONTRAST  Technique:  Multidetector CT imaging of the chest, abdomen and pelvis was performed following the standard protocol during bolus administration of intravenous contrast.  Contrast: OMNIPAQUE IOHEXOL 300 MG/ML  SOLN  Comparison:  None  CT CHEST  Findings:  Lungs/pleura: There is no pleural effusion.  No suspicious pulmonary nodule or mass noted.  Heart/Mediastinum: Heart size is normal.  No pericardial effusion. No mediastinal or hilar adenopathy.  Bones/Musculoskeletal:  Asymmetric increased soft tissue within the right breast parenchyma corresponding to patient's biopsy-proven invasive breast carcinoma.  There is associated overlying skin thickening.  Multiple enlarged right axillary lymph nodes are identified.  Index lymph node measures 1.7 cm, image 23/series 2. Several enhancing, sub centimeter retropectoral lymph nodes are identified.  IMPRESSION:  1.  No acute findings. 2.  Right breast invasive carcinoma with associated right axillary adenopathy.  CT ABDOMEN AND PELVIS  Findings:  There are no focal liver abnormalities identified.  The gallbladder appears normal.  There is no biliary dilatation.  The pancreas is unremarkable.  Normal appearance of the spleen.  The adrenal glands are both normal.  The kidneys are unremarkable. Urinary bladder appears normal.  The uterus is unremarkable. Corpus luteal cyst is noted in the left ovary measuring 1.6 cm.  No  free fluid or  fluid collections identified within the upper abdomen or pelvis.  The abdominal aorta has a normal caliber. There is no adenopathy identified within the upper abdomen.  No pelvic or inguinal adenopathy noted.  The stomach is normal.  The small bowel loops are unremarkable. Normal appearance of the colon.  Review of the visualized osseous structures is negative for aggressive lytic or sclerotic bone lesion.  IMPRESSION:  1.  No acute findings within the abdomen or pelvis. 2.  No mass or adenopathy.   Original Report Authenticated By: Signa Kell, M.D.   Mr Breast Bilateral W Wo Contrast  01/09/2013   *RADIOLOGY REPORT*  Clinical Data: Biopsy-proven right breast inflammatory carcinoma with primary invasive mammary carcinoma and previous biopsy and known right axillary metastatic disease.  Preoperative staging MRI.  BUN and creatinine were obtained on site at [facility] at [address] Results:  BUN 10 mg/dL,  Creatinine 0.9 mg/dL.  BILATERAL BREAST MRI WITH AND WITHOUT CONTRAST  Technique: Multiplanar, multisequence MR images of both breasts were obtained prior to and following the intravenous administration of 13ml of Multihance.  Three dimensional images were evaluated at the independent DynaCad workstation.  Comparison:  Prior mammograms and ultrasound.  The patient underwent diagnostic mammography and ultrasound of the left breast today as no recent prior exam was available after the recent diagnosis in the right breast.  Findings: There is diffuse right breast skin thickening and edema, trabecular edema, and right axillary lymphadenopathy with maximal nodal measurement 3.1 cm short-axis diameter.  There is also a 5 mm internal mammary artery chain lymph node identified image 26 series 2. There is an incompletely visualized lymph node subjacent to the pectoralis minor muscle on the first image of the T2-weighted series.  There is a large irregular enhancing mass in the right breast 11 o'clock  location with associated clip artifact measuring maximally 8.0 x 5.6 by 4.7 cm. This demonstrates washin/washout type enhancement kinetics.  This corresponds to the biopsy-proven invasive mammary carcinoma.  There are abnormal nodular areas of trabecular has been throughout the right breast involving all four quadrants. No abnormal underlying pectoralis enhancement or chest wall involvement is identified.  In the left breast 11 o'clock location, there is an 8 mm enhancing nodule corresponding to that seen at the examination earlier today. This area demonstrates plateau type enhancement kinetics.  No left- sided axillary or internal mammary artery chain lymphadenopathy is identified.  There is mild inhomogeneous nodular enhancement in the left breast two o'clock location at the site of the mass seen in this location earlier today, measuring 9 mm, measuring progressive enhancement kinetics.  IMPRESSION: Large irregular enhancing right breast mass 11 o'clock location corresponding to biopsy-proven invasive mammary carcinoma, with metastatic right axillary lymphadenopathy as well as skin and trabecular thickening/enhancement compatible with the clinical diagnosis of inflammatory and/or locally advanced breast cancer.  5 mm abnormal right internal mammary artery chain lymph node.  8 mm enhancing mass left breast 11 o'clock location and mildly irregular area of mass-like enhancement in the left breast two o'clock location, corresponding to those seen at diagnostic examination earlier today.  Biopsy has been scheduled for today and will be dictated separately.  RECOMMENDATION: Treatment plan  THREE-DIMENSIONAL MR IMAGE RENDERING ON INDEPENDENT WORKSTATION:  Three-dimensional MR images were rendered by post-processing of the original MR data on an independent workstation.  The three- dimensional MR images were interpreted, and findings were reported in the accompanying complete MRI report for this study.  BI-RADS CATEGORY  6:  Known biopsy-proven malignancy - appropriate action should be taken.   Original Report Authenticated By: Christiana Pellant, M.D.   Nm Pet Image Initial (pi) Skull Base To Thigh  01/20/2013   *RADIOLOGY REPORT*  Clinical Data: Initial treatment strategy for breast cancer.  NUCLEAR MEDICINE PET SKULL BASE TO THIGH  Fasting Blood Glucose:  263  Technique:  19.1 mCi F-18 FDG was injected intravenously. CT data was obtained and used for attenuation correction and anatomic localization only.  (This was not acquired as a diagnostic CT examination.) Additional exam technical data entered on technologist worksheet.  Comparison:  None  Findings:  Neck: No hypermetabolic lymph nodes in the neck.  Chest:  No hypermetabolic mediastinal or hilar nodes.  No suspicious pulmonary nodules on the CT scan. Enlarged right axillary lymph nodes identified.  Index lymph node measures 1.7 cm and has an SUV max equal to 2.8, image 77.  There is asymmetric increased uptake throughout the right breast with associated overlying skin thickening.  This has an SUV max equal to 4.4, image 101.  Abdomen/Pelvis:  No abnormal hypermetabolic activity within the liver, pancreas, adrenal glands, or spleen.  No hypermetabolic lymph nodes in the abdomen or pelvis. There is a small focus of increased FDG uptake within the left ovary.  This has an SUV max equal to 13.1, image 217.  Skeleton:  No focal hypermetabolic activity to suggest skeletal metastasis.  IMPRESSION:  1.  Asymmetric increased uptake associated with the right breast and right breast skin thickening corresponding to biopsy-proven invasive breast cancer. 2.  Enlarged and hypermetabolic right axillary lymph nodes consistent with metastatic adenopathy. 3.  No evidence for distant metastatic disease. 4.  Focal area of increased uptake within the left axilla.  The patient is noted to have a corpus luteal cyst are not diagnostic CT images.  This activity is favored to represent physiologic  activity.  Consider confirmatory imaging with pelvic sonogram.   Original Report Authenticated By: Signa Kell, M.D.   Ir Fluoro Guide Cv Line Left  01/15/2013   *RADIOLOGY REPORT*  Clinical Data/Indication: Breast cancer  TUNNEL POWER PORT PLACEMENT WITH SUBCUTANEOUS POCKET UTILIZING ULTRASOUND & FLOUROSCOPY  Sedation: Versed 5.0 mg, Fentanyl 250 mcg.  Total Moderate Sedation Time: 35 minutes.  As antibiotic prophylaxis, Ancef  was ordered pre-procedure and administered intravenously within one hour of incision.  Fluoroscopy Time: 36 seconds.  Procedure:  After written informed consent was obtained, patient was placed in the supine position on angiographic table. The left neck and chest was prepped and draped in a sterile fashion. Lidocaine was utilized for local anesthesia.  The left internal jugular vein was noted to be patent initially with ultrasound. Under sonographic guidance, a micropuncture needle was inserted into the left IJ vein (Ultrasound and fluoroscopic image documentation was performed). The needle was removed over an 018 wire which was exchanged for a Amplatz.  This was advanced into the IVC.  An 8-French dilator was advanced over the Amplatz.  A small incision was made in the left upper chest over the anterior left second rib.  Utilizing blunt dissection, a subcutaneous pocket was created in the caudal direction. The pocket was irrigated with a copious amount of sterile normal saline.  The port catheter was tunneled from the chest incision, and out the neck incision.  The reservoir was inserted into the subcutaneous pocket and secured with two 3-0 Ethilon stitches.  A peel-away sheath was advanced over the Amplatz wire.  The port  catheter was cut to measure length and inserted through the peel-away sheath.  The peel-away sheath was removed.  The chest incision was closed with 3-0 Vicryl interrupted stitches for the subcutaneous tissue and a running of 4- 0 Vicryl subcuticular stitch for the  skin.  The neck incision was closed with a 4-0 Vicryl subcuticular stitch.  Derma-bond was applied to both surgical incisions.  The port reservoir was flushed and instilled with heparinized saline.  No complications.  Findings:  A left IJ vein Port-A-Cath is in place with its tip at the cavoatrial junction.  IMPRESSION: Successful 8 French left internal jugular vein power port placement with its tip at the SVC/RA junction.   Original Report Authenticated By: Jolaine Click, M.D.   Ir US Guide Vasc Access Left  01/15/2013   *RADIOLOGY REPORT*  Clinical Data/Indication: Breast cancer  TUNNEL POWER PORT PLACEMENT WITH SUBCUTANEOUS POCKET UTILIZING ULTRASOUND & FLOUROSCOPY  Sedation: Versed 5.0 mg, Fentanyl 250 mcg.  Total Moderate Sedation Time: 35 minutes.  As antibiotic prophylaxis, Ancef  was ordered pre-procedure and administered intravenously within one hour of incision.  Fluoroscopy Time: 36 seconds.  Procedure:  After written informed consent was obtained, patient was placed in the supine position on angiographic table. The left neck and chest was prepped and draped in a sterile fashion. Lidocaine was utilized for local anesthesia.  The left internal jugular vein was noted to be patent initially with ultrasound. Under sonographic guidance, a micropuncture needle was inserted into the left IJ vein (Ultrasound and fluoroscopic image documentation was performed). The needle was removed over an 018 wire which was exchanged for a Amplatz.  This was advanced into the IVC.  An 8-French dilator was advanced over the Amplatz.  A small incision was made in the left upper chest over the anterior left second rib.  Utilizing blunt dissection, a subcutaneous pocket was created in the caudal direction. The pocket was irrigated with a copious amount of sterile normal saline.  The port catheter was tunneled from the chest incision, and out the neck incision.  The reservoir was inserted into the subcutaneous pocket and secured  with two 3-0 Ethilon stitches.  A peel-away sheath was advanced over the Amplatz wire.  The port catheter was cut to measure length and inserted through the peel-away sheath.  The peel-away sheath was removed.  The chest incision was closed with 3-0 Vicryl interrupted stitches for the subcutaneous tissue and a running of 4- 0 Vicryl subcuticular stitch for the skin.  The neck incision was closed with a 4-0 Vicryl subcuticular stitch.  Derma-bond was applied to both surgical incisions.  The port reservoir was flushed and instilled with heparinized saline.  No complications.  Findings:  A left IJ vein Port-A-Cath is in place with its tip at the cavoatrial junction.  IMPRESSION: Successful 8 French left internal jugular vein power port placement with its tip at the SVC/RA junction.   Original Report Authenticated By: Jolaine Click, M.D.   Mm Digital Diagnostic Unilat L  01/09/2013   *RADIOLOGY REPORT*  Clinical Data:  Evaluate clip placement following two separate ultrasound-guided left breast biopsies.  DIGITAL DIAGNOSTIC LEFT MAMMOGRAM  Comparison:  Previous exams  Findings:  Films are performed following two separate ultrasound guided biopsies of the left breast - at the 11 o'clock position and 2 o'clock position. The ribbon shaped biopsy clip corresponds to the 11 o'clock mass and the T shaped biopsy clip corresponds to the 2 o'clock mass. No immediate complications are identified.  IMPRESSION: Satisfactory clip placement following ultrasound guided left breast biopsies.  Pathology will be followed.   Original Report Authenticated By: Harmon Pier, M.D.   Mm Digital Diagnostic Unilat L  01/09/2013   *RADIOLOGY REPORT*  Clinical Data:  46 year old female with newly diagnosed right breast cancer.  Left mammogram to compare to presurgical MRI.  DIGITAL DIAGNOSTIC LEFT MAMMOGRAM WITH CAD AND LEFT BREAST ULTRASOUND:  Comparison:  06/11/2012 mammogram and 01/09/2013 MRI  Findings:  ACR Breast Density Category 3: The  breast tissue is heterogeneously dense.  A possible circumscribed mass in the upper outer left breast is identified. No suspicious calcifications or distortion identified.  Mammographic images were processed with CAD.  On physical exam, no palpable abnormalities identified within the upper left breast.  Ultrasound is performed, showing a 6 x 6 x 8 mm slightly irregular hypoechoic mass at the 11 o'clock position of the left breast 5 cm from the nipple An 11 x 7 x 9 mm slightly irregular hypoechoic mass at the 2 o'clock position of the left breast 4 cm from the nipple is also present. No enlarged or abnormal appearing left axillary lymph nodes are identified.  IMPRESSION: Indeterminate 6 x 6 x 8 mm hypoechoic mass at the 11 o'clock position of the left breast and 11 x 7 x 9 mm hypoechoic mass in the 2 o'clock position of the left breast.  The 11 o'clock position mass probably represents the enhancing nodule identified on today's MRI.  Tissue sampling of both masses is recommended.  BI-RADS CATEGORY 4:  Suspicious abnormality - biopsy should be considered.  RECOMMENDATION: Ultrasound guided biopsies of both left breast masses.  The findings and recommendations were discussed with the patient and she desires to proceed with ultrasound guided left breast biopsies. These biopsies will be performed today but dictated in a separate report.  Results were also provided in writing at the conclusion of the visit.  If applicable, a reminder letter will be sent to the patient regarding the next appointment.   Original Report Authenticated By: Harmon Pier, M.D.   [place] Lt Breast Bx W Loc Dev 1st Lesion Img Bx Spec US Guide  01/12/2013   *RADIOLOGY REPORT*  Clinical Data:  47 year old female with newly diagnosed right breast cancer.  Two left breast masses identified sonographically - 6 x 8 mm at the 11 o'clock position and 9 x 11 mm at the 2 o'clock position. For tissue sampling of the 6 x 8 mm mass at the 11 o'clock position.   ULTRASOUND GUIDED VACUUM ASSISTED CORE BIOPSY OF THE LEFT BREAST  Comparison: Previous exams.  I met with the patient and we discussed the procedure of ultrasound- guided biopsy, including benefits and alternatives.  We discussed the high likelihood of a successful procedure. We discussed the risks of the procedure including infection, bleeding, tissue injury, clip migration, and inadequate sampling.  Informed written consent was given.  Using sterile technique and 2% Lidocaine as local anesthetic, under direct ultrasound visualization, a12 gauge vacuum-assisteddevice was used to perform biopsy of the 6 x 8 mm hypoechoic mass at the 11 o'clock position of the left breast 5 cm from the nipple using a medial approach. At the conclusion of the procedure, a ribbon shaped tissue marker clip was deployed into the biopsy cavity. Follow-up 2-view mammogram was performed and dictated separately.  The usual time-out protocol was performed immediately prior to the procedure.  IMPRESSION: Ultrasound-guided biopsy of 6 x 8 mm hypoechoic mass at the 11 o'clock position of  the left breast.  No apparent complications.  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  Histology correlates with imaging findings.  The patient was contacted by phone on 01/12/2013 and these results given to her which she understood. Her questions were answered. The patient had no complaints with her biopsy site.  Recommend treatment plan.   Original Report Authenticated By: Harmon Pier, M.D.   [place] Lt Breast Bx W Loc Dev Ea Add Lesion Img Bx Spec US Guide  01/12/2013   *RADIOLOGY REPORT*  Clinical Data:  47 year old female with newly diagnosed right breast cancer.  Two left breast masses identified sonographically - 6 x 8 mm at the 11 o'clock position and 9 x 11 mm at the 2 o'clock position. For tissue sampling of the 9 x 11 mm mass at the 2 o'clock position.  ULTRASOUND GUIDED VACUUM ASSISTED CORE  BIOPSY OF THE LEFT BREAST  Comparison: Previous exams.  I met with the patient and we discussed the procedure of ultrasound- guided biopsy, including benefits and alternatives.  We discussed the high likelihood of a successful procedure. We discussed the risks of the procedure including infection, bleeding, tissue injury, clip migration, and inadequate sampling.  Informed written consent was given.  Using sterile technique and 2% Lidocaine as local anesthetic, under direct ultrasound visualization, a 12 gauge vacuum-assisteddevice was used to perform biopsy of the 9 x 11 mm hypoechoic mass at the 2 o'clock position of the left breast 4 cm from the nipple using a medial approach. At the conclusion of the procedure, a  tissue marker clip was deployed into the biopsy cavity.  Follow-up 2-view mammogram was performed and dictated separately.  The usual time-out protocol was performed immediately prior to the procedure.  IMPRESSION: Ultrasound-guided biopsy of 9 x 11 mm mass in the 2 o'clock position of the left breast.  No apparent complications.  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 Histology correlates with imaging findings.  The patient was contacted by phone on 01/12/2013 and these results given to her which she understood. Her questions were answered. The patient had no complaints with her biopsy site.  Recommend treatment plan.   Original Report Authenticated By: Harmon Pier, M.D.    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 Patient is receiving neoadjuvant chemotherapy and her to therapy. She is receiving double HER-2 therapy with Herceptin and perjeta. She receives chemotherapy consisting of Taxotere and carboplatinum. All drugs are given every 3 weeks with day 2 Neulasta. She began her treatment on  01/16/2013. Thus far she is tolerating it well.  #2 patient is set up to see the genetic counselor in the next few weeks since she does have  unde breast cancer under the age of 32. We again today discussed the rationale for referring her to genetic counseling. 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   PLAN:   #1 after receiving cycle 3 of TCH/perjeta patient is doing well.  #2she will return in 2 weeks' time for cycle 4 of her therapy.  All questions were answered. The patient knows to call the clinic with any problems, questions or concerns. We can certainly see the patient much sooner  if necessary.  I spent 25 minutes counseling the patient face to face. The total time spent in the appointment was 30 minutes.    Drue Second, MD Medical/Oncology [facility] [phone] (beeper) [phone] (Office)

## 2013-03-27 ENCOUNTER — Other Ambulatory Visit (HOSPITAL_BASED_OUTPATIENT_CLINIC_OR_DEPARTMENT_OTHER): Payer: BC Managed Care – PPO | Admitting: Lab

## 2013-03-27 ENCOUNTER — Encounter: Payer: Self-pay | Admitting: Oncology

## 2013-03-27 ENCOUNTER — Telehealth: Payer: Self-pay | Admitting: *Deleted

## 2013-03-27 ENCOUNTER — Ambulatory Visit (HOSPITAL_BASED_OUTPATIENT_CLINIC_OR_DEPARTMENT_OTHER): Payer: BC Managed Care – PPO | Admitting: Oncology

## 2013-03-27 VITALS — BP 108/73 | HR 118 | Temp 98.0°F | Resp 20 | Ht 64.0 in | Wt 136.7 lb

## 2013-03-27 DIAGNOSIS — C50911 Malignant neoplasm of unspecified site of right female breast: Secondary | ICD-10-CM

## 2013-03-27 DIAGNOSIS — R609 Edema, unspecified: Secondary | ICD-10-CM

## 2013-03-27 DIAGNOSIS — C773 Secondary and unspecified malignant neoplasm of axilla and upper limb lymph nodes: Secondary | ICD-10-CM

## 2013-03-27 DIAGNOSIS — C50419 Malignant neoplasm of upper-outer quadrant of unspecified female breast: Secondary | ICD-10-CM

## 2013-03-27 DIAGNOSIS — Z171 Estrogen receptor negative status [ER-]: Secondary | ICD-10-CM

## 2013-03-27 LAB — COMPREHENSIVE METABOLIC PANEL (CC13)
ALT: 14 U/L (ref 0–55)
Albumin: 3.4 g/dL — ABNORMAL LOW (ref 3.5–5.0)
CO2: 25 mEq/L (ref 22–29)
Calcium: 8.6 mg/dL (ref 8.4–10.4)
Chloride: 96 mEq/L — ABNORMAL LOW (ref 98–109)
Glucose: 255 mg/dl — ABNORMAL HIGH (ref 70–140)
Potassium: 4.5 mEq/L (ref 3.5–5.1)
Sodium: 130 mEq/L — ABNORMAL LOW (ref 136–145)
Total Bilirubin: 0.9 mg/dL (ref 0.20–1.20)
Total Protein: 6.1 g/dL — ABNORMAL LOW (ref 6.4–8.3)

## 2013-03-27 LAB — CBC WITH DIFFERENTIAL/PLATELET
BASO%: 0.2 % (ref 0.0–2.0)
Eosinophils Absolute: 0 10*3/uL (ref 0.0–0.5)
MCHC: 34.5 g/dL (ref 31.5–36.0)
MONO#: 1.2 10*3/uL — ABNORMAL HIGH (ref 0.1–0.9)
NEUT#: 6.5 10*3/uL (ref 1.5–6.5)
RBC: 4.09 10*6/uL (ref 3.70–5.45)
RDW: 16.5 % — ABNORMAL HIGH (ref 11.2–14.5)
WBC: 9.6 10*3/uL (ref 3.9–10.3)
lymph#: 2 10*3/uL (ref 0.9–3.3)

## 2013-03-27 NOTE — Telephone Encounter (Signed)
appts made and printed. Pt is aware that cs will call her w/ an appt for MRI of the breast. Pt is aware that tx will follow after her ov on 9/19. i emailed MW to add the tx...td

## 2013-03-27 NOTE — Telephone Encounter (Signed)
Per staff phone call and POF I have schedueld appts.  [name]  

## 2013-03-31 ENCOUNTER — Other Ambulatory Visit: Payer: Self-pay | Admitting: Certified Registered Nurse Anesthetist

## 2013-04-01 ENCOUNTER — Telehealth: Payer: Self-pay | Admitting: Medical Oncology

## 2013-04-01 NOTE — Telephone Encounter (Signed)
Patient LVMOM to report having increase in "eyes watering, both but mostly left is constantly watering and vision is blurry." States she read that it could be one of the s/e to chemotherapy and was reporting it.  Lab/MD/tx sched 08/29  mssg forwarded to MD/NP for review.

## 2013-04-06 NOTE — Progress Notes (Signed)
OFFICE PROGRESS NOTE  CC  Jasmine Belling, MD [address] [place] [address] Dr. Lurline Hare  Dr. 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 At that time she had mammogram performed and it was negative. However her breast continue to increase in size. Initially patient felt that it could have been cysts or something on and therefore she did not go to a physician. However subsequently patient's breast became quite painful and she also began to feel in knot under her right arm.because of this she went to her physician who did a physical exam and a mammogram was performed on 01/01/2013 the mammogram showed diffuse skin thickening generalized increased density within the right breast and enlarged right axillary lymph nodes. She went on to have an ultrasound of the right breast performed that showed a large spiculated hypoechoic mass in the right breast 11:00 2 cm from the nipple measuring at least 4.5 cm in size. There were abnormal lymph nodes with thickened cortex and the right axilla the largest one measuring 3.1 cm in dimension.  #2Patient was recommended ultrasound guided core biopsy of the right breast mass and the right axilla lymph node. The biopsy was performed on 01/01/2013. The right needle core biopsy of the primary mass showed invasive mammary carcinoma grade 2-3 was invasive ductal. The lymph node of the right axilla was positive for metastatic disease. The tumor was ER PR negative HER-2/neu positive with a Ki-67 of 76%. On 01/09/2013 patient had MRI of the breasts performed that showed diffuse right breast skin thickening and edema with right axillary lymph node. There was a 5 mm internal mammary lymph node that  was incompletely imaged. The right breast mass measured 8 x 5 x 4.7 cm. There were abnormal areas involving all 4 quadrants. There was however no involvement of the pectoralis minor for lower chest wall. The left breast showed 2 areas of nodular enhancement these were biopsied and were fibroadenomas  #3 it was recommended that patient begin neoadjuvant chemotherapy consisting of Taxotere carboplatinum Herceptin and perjeta. This would be given every 3 weeks with day 2 Neulasta. A total of 6 cycles of therapy were planned. Once she completes the treatment then she would proceed with MRI of the breasts again for evaluation a response to therapy and eventually a mastectomy.  #4 patient began Neoadjuvant with curative intentTaxotere carboplatinum Herceptin and perjeta starting 01/16/2013.  CURRENT THERAPY: Status post Neoadjuvant cycle 4 TCH/perjeta with day 2 Neulasta   INTERVAL HISTORY: Jasmine Schwartz 47 y.o. female returns for followup visit today overall she is doing well she is without any complaints. Overall she's doing well she is tolerating her chemotherapy very nicely without any problems. Her blood work looks Community education officer. She denies any nausea vomiting fevers chills night sweats headaches shortness of breath chest pains or palpitations no peripheral paresthesias. No myalgias and arthralgias. Remainder of the 10 point review of systems is negative.  MEDICAL HISTORY: Past Medical History  Diagnosis Date  . Anxiety   . Depression   . Dyslipidemia   . Anxiety and depression 07/06/2011  . Breast cancer 01/01/13    /metastatic 1/1 lymph nodes  . Diabetes mellitus     IDDM  . Hx of insertion  of insulin pump     ALLERGIES:  has No Known Allergies.  MEDICATIONS:  Current Outpatient Prescriptions  Medication Sig Dispense Refill  . ALPRAZolam (XANAX) 0.25 MG tablet Take 0.25 mg by mouth 3 (three) times daily as needed for sleep or anxiety.      . Alum & Mag Hydroxide-Simeth (MAGIC  MOUTHWASH W/LIDOCAINE) SOLN Take 5 mLs by mouth 4 (four) times daily as needed. Swish and spit.  120 mL  1  . amoxicillin (AMOXIL) 875 MG tablet       . dexamethasone (DECADRON) 4 MG tablet Take 2 tablets (8 mg total) by mouth 2 (two) times daily with a meal. Take two times a day the day before Taxotere. Then take two times a day starting the day after chemo for 3 days.  40 tablet  3  . fluconazole (DIFLUCAN) 200 MG tablet Take 1 tablet (200 mg total) by mouth daily.  5 tablet  5  . glucose blood (NOVA MAX TEST) test strip Use as directed up to 8 times a day      . Insulin Infusion Pump (MINIMED INSULIN PUMP) DEVI Inject 1 Units/hr into the vein continuous. Pt give extra per cards at meal times. Basic meals are about 5 units      . Insulin Infusion Pump Supplies (MINIMED INFUSION SET-MMT 397) MISC 1 Device by Does not apply route every 3 (three) days.      . Insulin Infusion Pump Supplies (PARADIGM PUMP RESERVOIR 1.76ML) MISC 1 Device by Does not apply route every 3 (three) days.      . insulin lispro (HUMALOG) 100 UNIT/ML injection Inject as directed into Insulin pump, total of 40 units/day      . lidocaine-prilocaine (EMLA) cream Apply topically as needed. Apply to port-a-cath site 1-2 hours before treatment.  30 g  3  . LORazepam (ATIVAN) 0.5 MG tablet Take 1 tablet (0.5 mg total) by mouth every 6 (six) hours as needed (Nausea or vomiting).  30 tablet  2  . ondansetron (ZOFRAN) 8 MG tablet Take 8 mg by mouth 2 (two) times daily. Take two times a day starting the day after chemo for 3 days. Then take two times a day as needed for nausea or vomiting.      . prochlorperazine (COMPAZINE) 10 MG tablet Take 1 tablet (10 mg total) by mouth every 6 (six) hours as needed (Nausea or vomiting).  30 tablet  1  . prochlorperazine (COMPAZINE) 25 MG suppository Place 25 mg rectally every 12 (twelve) hours as needed for nausea.      . sucralfate (CARAFATE) 1 GM/10ML suspension Take 10 mLs (1 g total) by mouth 4  (four) times daily.  420 mL  6  . valACYclovir (VALTREX) 500 MG tablet Take 1 tablet (500 mg total) by mouth 2 (two) times daily.  30 tablet  4   No current facility-administered medications for this visit.    SURGICAL HISTORY:  Past Surgical History  Procedure Laterality Date  . Lumbar spine surgery  2009    Dr Jillyn Hidden  . Cesarean section  1989  . Tubal ligation  1991  . Breast biopsy Right 01/01/13    Invasive mammary ca,metastatic in 1/1 lymph node  . Breast biopsy Left 01/09/13    Fibroadenoma,microcalcifications    REVIEW OF SYSTEMS:  Pertinent items are noted in HPI.   HEALTH MAINTENANCE:  PHYSICAL EXAMINATION: Blood pressure 108/73, pulse 118, temperature 98 F (36.7 C), temperature source Oral, resp. rate 20, height 5'  4" (1.626 m), weight 136 lb 11.2 oz (62.007 kg). Body mass index is 23.45 kg/(m^2). ECOG PERFORMANCE STATUS: 0 - Asymptomatic   General appearance: alert, cooperative and appears stated age Resp: clear to auscultation bilaterally Cardio: regular rate and rhythm GI: soft, non-tender; bowel sounds normal; no masses,  no organomegaly Extremities: extremities normal, atraumatic, no cyanosis or edema Neurologic: Grossly normal Right breast reveals a large palpable mass without any nipple discharge it is softer Left breast no masses or nipple discharge  LABORATORY DATA: Lab Results  Component Value Date   WBC 9.6 03/27/2013   HGB 12.9 03/27/2013   HCT 37.4 03/27/2013   MCV 91.3 03/27/2013   PLT 192 03/27/2013      Chemistry      Component Value Date/Time   NA 130* 03/27/2013 1404   NA 140 03/31/2012 0934   K 4.5 03/27/2013 1404   K 4.7 03/31/2012 0934   CL 99 01/23/2013 1251   CL 106 03/31/2012 0934   CO2 25 03/27/2013 1404   CO2 27 03/31/2012 0934   BUN 13.5 03/27/2013 1404   BUN 13 03/31/2012 0934   CREATININE 0.8 03/27/2013 1404   CREATININE 0.6 03/31/2012 0934      Component Value Date/Time   CALCIUM 8.6 03/27/2013 1404   CALCIUM 8.9 03/31/2012 0934    ALKPHOS 84 03/27/2013 1404   ALKPHOS 42 03/31/2012 0934   AST 14 03/27/2013 1404   AST 21 03/31/2012 0934   ALT 14 03/27/2013 1404   ALT 15 03/31/2012 0934   BILITOT 0.90 03/27/2013 1404   BILITOT 0.7 03/31/2012 0934       RADIOGRAPHIC STUDIES:  Ct Chest W Contrast  01/20/2013   *RADIOLOGY REPORT*  Clinical Data:  High risk breast cancer.  Evaluate for metastatic disease.  CT CHEST, ABDOMEN AND PELVIS WITH CONTRAST  Technique:  Multidetector CT imaging of the chest, abdomen and pelvis was performed following the standard protocol during bolus administration of intravenous contrast.  Contrast: OMNIPAQUE IOHEXOL 300 MG/ML  SOLN  Comparison:  None  CT CHEST  Findings:  Lungs/pleura: There is no pleural effusion.  No suspicious pulmonary nodule or mass noted.  Heart/Mediastinum: Heart size is normal.  No pericardial effusion. No mediastinal or hilar adenopathy.  Bones/Musculoskeletal:  Asymmetric increased soft tissue within the right breast parenchyma corresponding to patient's biopsy-proven invasive breast carcinoma.  There is associated overlying skin thickening.  Multiple enlarged right axillary lymph nodes are identified.  Index lymph node measures 1.7 cm, image 23/series 2. Several enhancing, sub centimeter retropectoral lymph nodes are identified.  IMPRESSION:  1.  No acute findings. 2.  Right breast invasive carcinoma with associated right axillary adenopathy.  CT ABDOMEN AND PELVIS  Findings:  There are no focal liver abnormalities identified.  The gallbladder appears normal.  There is no biliary dilatation.  The pancreas is unremarkable.  Normal appearance of the spleen.  The adrenal glands are both normal.  The kidneys are unremarkable. Urinary bladder appears normal.  The uterus is unremarkable. Corpus luteal cyst is noted in the left ovary measuring 1.6 cm.  No free fluid or fluid collections identified within the upper abdomen or pelvis.  The abdominal aorta has a normal caliber. There is no  adenopathy identified within the upper abdomen.  No pelvic or inguinal adenopathy noted.  The stomach is normal.  The small bowel loops are unremarkable. Normal appearance of the colon.  Review of the visualized osseous structures is negative for aggressive lytic or sclerotic bone  lesion.  IMPRESSION:  1.  No acute findings within the abdomen or pelvis. 2.  No mass or adenopathy.   Original Report Authenticated By: Signa Kell, M.D.   US Breast Left  01/09/2013   *RADIOLOGY REPORT*  Clinical Data:  47 year old female with newly diagnosed right breast cancer.  Left mammogram to compare to presurgical MRI.  DIGITAL DIAGNOSTIC LEFT MAMMOGRAM WITH CAD AND LEFT BREAST ULTRASOUND:  Comparison:  06/11/2012 mammogram and 01/09/2013 MRI  Findings:  ACR Breast Density Category 3: The breast tissue is heterogeneously dense.  A possible circumscribed mass in the upper outer left breast is identified. No suspicious calcifications or distortion identified.  Mammographic images were processed with CAD.  On physical exam, no palpable abnormalities identified within the upper left breast.  Ultrasound is performed, showing a 6 x 6 x 8 mm slightly irregular hypoechoic mass at the 11 o'clock position of the left breast 5 cm from the nipple An 11 x 7 x 9 mm slightly irregular hypoechoic mass at the 2 o'clock position of the left breast 4 cm from the nipple is also present. No enlarged or abnormal appearing left axillary lymph nodes are identified.  IMPRESSION: Indeterminate 6 x 6 x 8 mm hypoechoic mass at the 11 o'clock position of the left breast and 11 x 7 x 9 mm hypoechoic mass in the 2 o'clock position of the left breast.  The 11 o'clock position mass probably represents the enhancing nodule identified on today's MRI.  Tissue sampling of both masses is recommended.  BI-RADS CATEGORY 4:  Suspicious abnormality - biopsy should be considered.  RECOMMENDATION: Ultrasound guided biopsies of both left breast masses.  The findings and  recommendations were discussed with the patient and she desires to proceed with ultrasound guided left breast biopsies. These biopsies will be performed today but dictated in a separate report.  Results were also provided in writing at the conclusion of the visit.  If applicable, a reminder letter will be sent to the patient regarding the next appointment.   Original Report Authenticated By: Jasmine Schwartz, M.D.   Ct Abdomen Pelvis W Contrast  01/22/2013   *RADIOLOGY REPORT*  Clinical Data:  High risk breast cancer.  Evaluate for metastatic disease.  CT CHEST, ABDOMEN AND PELVIS WITH CONTRAST  Technique:  Multidetector CT imaging of the chest, abdomen and pelvis was performed following the standard protocol during bolus administration of intravenous contrast.  Contrast: OMNIPAQUE IOHEXOL 300 MG/ML  SOLN  Comparison:  None  CT CHEST  Findings:  Lungs/pleura: There is no pleural effusion.  No suspicious pulmonary nodule or mass noted.  Heart/Mediastinum: Heart size is normal.  No pericardial effusion. No mediastinal or hilar adenopathy.  Bones/Musculoskeletal:  Asymmetric increased soft tissue within the right breast parenchyma corresponding to patient's biopsy-proven invasive breast carcinoma.  There is associated overlying skin thickening.  Multiple enlarged right axillary lymph nodes are identified.  Index lymph node measures 1.7 cm, image 23/series 2. Several enhancing, sub centimeter retropectoral lymph nodes are identified.  IMPRESSION:  1.  No acute findings. 2.  Right breast invasive carcinoma with associated right axillary adenopathy.  CT ABDOMEN AND PELVIS  Findings:  There are no focal liver abnormalities identified.  The gallbladder appears normal.  There is no biliary dilatation.  The pancreas is unremarkable.  Normal appearance of the spleen.  The adrenal glands are both normal.  The kidneys are unremarkable. Urinary bladder appears normal.  The uterus is unremarkable. Corpus luteal cyst is noted in  the left ovary measuring 1.6 cm.  No free fluid or fluid collections identified within the upper abdomen or pelvis.  The abdominal aorta has a normal caliber. There is no adenopathy identified within the upper abdomen.  No pelvic or inguinal adenopathy noted.  The stomach is normal.  The small bowel loops are unremarkable. Normal appearance of the colon.  Review of the visualized osseous structures is negative for aggressive lytic or sclerotic bone lesion.  IMPRESSION:  1.  No acute findings within the abdomen or pelvis. 2.  No mass or adenopathy.   Original Report Authenticated By: Signa Kell, M.D.   Mr Breast Bilateral W Wo Contrast  01/09/2013   *RADIOLOGY REPORT*  Clinical Data: Biopsy-proven right breast inflammatory carcinoma with primary invasive mammary carcinoma and previous biopsy and known right axillary metastatic disease.  Preoperative staging MRI.  BUN and creatinine were obtained on site at [facility] at [address] Results:  BUN 10 mg/dL,  Creatinine 0.9 mg/dL.  BILATERAL BREAST MRI WITH AND WITHOUT CONTRAST  Technique: Multiplanar, multisequence MR images of both breasts were obtained prior to and following the intravenous administration of 13ml of Multihance.  Three dimensional images were evaluated at the independent DynaCad workstation.  Comparison:  Prior mammograms and ultrasound.  The patient underwent diagnostic mammography and ultrasound of the left breast today as no recent prior exam was available after the recent diagnosis in the right breast.  Findings: There is diffuse right breast skin thickening and edema, trabecular edema, and right axillary lymphadenopathy with maximal nodal measurement 3.1 cm short-axis diameter.  There is also a 5 mm internal mammary artery chain lymph node identified image 26 series 2. There is an incompletely visualized lymph node subjacent to the pectoralis minor muscle on the first image of the T2-weighted series.  There is a large  irregular enhancing mass in the right breast 11 o'clock location with associated clip artifact measuring maximally 8.0 x 5.6 by 4.7 cm. This demonstrates washin/washout type enhancement kinetics.  This corresponds to the biopsy-proven invasive mammary carcinoma.  There are abnormal nodular areas of trabecular has been throughout the right breast involving all four quadrants. No abnormal underlying pectoralis enhancement or chest wall involvement is identified.  In the left breast 11 o'clock location, there is an 8 mm enhancing nodule corresponding to that seen at the examination earlier today. This area demonstrates plateau type enhancement kinetics.  No left- sided axillary or internal mammary artery chain lymphadenopathy is identified.  There is mild inhomogeneous nodular enhancement in the left breast two o'clock location at the site of the mass seen in this location earlier today, measuring 9 mm, measuring progressive enhancement kinetics.  IMPRESSION: Large irregular enhancing right breast mass 11 o'clock location corresponding to biopsy-proven invasive mammary carcinoma, with metastatic right axillary lymphadenopathy as well as skin and trabecular thickening/enhancement compatible with the clinical diagnosis of inflammatory and/or locally advanced breast cancer.  5 mm abnormal right internal mammary artery chain lymph node.  8 mm enhancing mass left breast 11 o'clock location and mildly irregular area of mass-like enhancement in the left breast two o'clock location, corresponding to those seen at diagnostic examination earlier today.  Biopsy has been scheduled for today and will be dictated separately.  RECOMMENDATION: Treatment plan  THREE-DIMENSIONAL MR IMAGE RENDERING ON INDEPENDENT WORKSTATION:  Three-dimensional MR images were rendered by post-processing of the original MR data on an independent workstation.  The three- dimensional MR images were interpreted, and findings were reported in the accompanying  complete MRI report for this study.  BI-RADS CATEGORY 6:  Known biopsy-proven malignancy - appropriate action should be taken.   Original Report Authenticated By: Jasmine Schwartz, M.D.   Nm Pet Image Initial (pi) Skull Base To Thigh  01/20/2013   *RADIOLOGY REPORT*  Clinical Data: Initial treatment strategy for breast cancer.  NUCLEAR MEDICINE PET SKULL BASE TO THIGH  Fasting Blood Glucose:  263  Technique:  19.1 mCi F-18 FDG was injected intravenously. CT data was obtained and used for attenuation correction and anatomic localization only.  (This was not acquired as a diagnostic CT examination.) Additional exam technical data entered on technologist worksheet.  Comparison:  None  Findings:  Neck: No hypermetabolic lymph nodes in the neck.  Chest:  No hypermetabolic mediastinal or hilar nodes.  No suspicious pulmonary nodules on the CT scan. Enlarged right axillary lymph nodes identified.  Index lymph node measures 1.7 cm and has an SUV max equal to 2.8, image 77.  There is asymmetric increased uptake throughout the right breast with associated overlying skin thickening.  This has an SUV max equal to 4.4, image 101.  Abdomen/Pelvis:  No abnormal hypermetabolic activity within the liver, pancreas, adrenal glands, or spleen.  No hypermetabolic lymph nodes in the abdomen or pelvis. There is a small focus of increased FDG uptake within the left ovary.  This has an SUV max equal to 13.1, image 217.  Skeleton:  No focal hypermetabolic activity to suggest skeletal metastasis.  IMPRESSION:  1.  Asymmetric increased uptake associated with the right breast and right breast skin thickening corresponding to biopsy-proven invasive breast cancer. 2.  Enlarged and hypermetabolic right axillary lymph nodes consistent with metastatic adenopathy. 3.  No evidence for distant metastatic disease. 4.  Focal area of increased uptake within the left axilla.  The patient is noted to have a corpus luteal cyst are not diagnostic CT images.   This activity is favored to represent physiologic activity.  Consider confirmatory imaging with pelvic sonogram.   Original Report Authenticated By: Signa Kell, M.D.   Ir Fluoro Guide Cv Line Left  01/15/2013   *RADIOLOGY REPORT*  Clinical Data/Indication: Breast cancer  TUNNEL POWER PORT PLACEMENT WITH SUBCUTANEOUS POCKET UTILIZING ULTRASOUND & FLOUROSCOPY  Sedation: Versed 5.0 mg, Fentanyl 250 mcg.  Total Moderate Sedation Time: 35 minutes.  As antibiotic prophylaxis, Ancef  was ordered pre-procedure and administered intravenously within one hour of incision.  Fluoroscopy Time: 36 seconds.  Procedure:  After written informed consent was obtained, patient was placed in the supine position on angiographic table. The left neck and chest was prepped and draped in a sterile fashion. Lidocaine was utilized for local anesthesia.  The left internal jugular vein was noted to be patent initially with ultrasound. Under sonographic guidance, a micropuncture needle was inserted into the left IJ vein (Ultrasound and fluoroscopic image documentation was performed). The needle was removed over an 018 wire which was exchanged for a Amplatz.  This was advanced into the IVC.  An 8-French dilator was advanced over the Amplatz.  A small incision was made in the left upper chest over the anterior left Schwartz rib.  Utilizing blunt dissection, a subcutaneous pocket was created in the caudal direction. The pocket was irrigated with a copious amount of sterile normal saline.  The port catheter was tunneled from the chest incision, and out the neck incision.  The reservoir was inserted into the subcutaneous pocket and secured with two 3-0 Ethilon stitches.  A peel-away sheath was advanced over  the Amplatz wire.  The port catheter was cut to measure length and inserted through the peel-away sheath.  The peel-away sheath was removed.  The chest incision was closed with 3-0 Vicryl interrupted stitches for the subcutaneous tissue and a  running of 4- 0 Vicryl subcuticular stitch for the skin.  The neck incision was closed with a 4-0 Vicryl subcuticular stitch.  Derma-bond was applied to both surgical incisions.  The port reservoir was flushed and instilled with heparinized saline.  No complications.  Findings:  A left IJ vein Port-A-Cath is in place with its tip at the cavoatrial junction.  IMPRESSION: Successful 8 French left internal jugular vein power port placement with its tip at the SVC/RA junction.   Original Report Authenticated By: Jolaine Click, M.D.   Ir US Guide Vasc Access Left  01/15/2013   *RADIOLOGY REPORT*  Clinical Data/Indication: Breast cancer  TUNNEL POWER PORT PLACEMENT WITH SUBCUTANEOUS POCKET UTILIZING ULTRASOUND & FLOUROSCOPY  Sedation: Versed 5.0 mg, Fentanyl 250 mcg.  Total Moderate Sedation Time: 35 minutes.  As antibiotic prophylaxis, Ancef  was ordered pre-procedure and administered intravenously within one hour of incision.  Fluoroscopy Time: 36 seconds.  Procedure:  After written informed consent was obtained, patient was placed in the supine position on angiographic table. The left neck and chest was prepped and draped in a sterile fashion. Lidocaine was utilized for local anesthesia.  The left internal jugular vein was noted to be patent initially with ultrasound. Under sonographic guidance, a micropuncture needle was inserted into the left IJ vein (Ultrasound and fluoroscopic image documentation was performed). The needle was removed over an 018 wire which was exchanged for a Amplatz.  This was advanced into the IVC.  An 8-French dilator was advanced over the Amplatz.  A small incision was made in the left upper chest over the anterior left Schwartz rib.  Utilizing blunt dissection, a subcutaneous pocket was created in the caudal direction. The pocket was irrigated with a copious amount of sterile normal saline.  The port catheter was tunneled from the chest incision, and out the neck incision.  The reservoir was  inserted into the subcutaneous pocket and secured with two 3-0 Ethilon stitches.  A peel-away sheath was advanced over the Amplatz wire.  The port catheter was cut to measure length and inserted through the peel-away sheath.  The peel-away sheath was removed.  The chest incision was closed with 3-0 Vicryl interrupted stitches for the subcutaneous tissue and a running of 4- 0 Vicryl subcuticular stitch for the skin.  The neck incision was closed with a 4-0 Vicryl subcuticular stitch.  Derma-bond was applied to both surgical incisions.  The port reservoir was flushed and instilled with heparinized saline.  No complications.  Findings:  A left IJ vein Port-A-Cath is in place with its tip at the cavoatrial junction.  IMPRESSION: Successful 8 French left internal jugular vein power port placement with its tip at the SVC/RA junction.   Original Report Authenticated By: Jolaine Click, M.D.   Mm Digital Diagnostic Unilat L  01/09/2013   *RADIOLOGY REPORT*  Clinical Data:  Evaluate clip placement following two separate ultrasound-guided left breast biopsies.  DIGITAL DIAGNOSTIC LEFT MAMMOGRAM  Comparison:  Previous exams  Findings:  Films are performed following two separate ultrasound guided biopsies of the left breast - at the 11 o'clock position and 2 o'clock position. The ribbon shaped biopsy clip corresponds to the 11 o'clock mass and the T shaped biopsy clip corresponds to the 2 o'clock mass.  No immediate complications are identified.  IMPRESSION: Satisfactory clip placement following ultrasound guided left breast biopsies.  Pathology will be followed.   Original Report Authenticated By: Jasmine Schwartz, M.D.   Mm Digital Diagnostic Unilat L  01/09/2013   *RADIOLOGY REPORT*  Clinical Data:  46 year old female with newly diagnosed right breast cancer.  Left mammogram to compare to presurgical MRI.  DIGITAL DIAGNOSTIC LEFT MAMMOGRAM WITH CAD AND LEFT BREAST ULTRASOUND:  Comparison:  06/11/2012 mammogram and 01/09/2013 MRI   Findings:  ACR Breast Density Category 3: The breast tissue is heterogeneously dense.  A possible circumscribed mass in the upper outer left breast is identified. No suspicious calcifications or distortion identified.  Mammographic images were processed with CAD.  On physical exam, no palpable abnormalities identified within the upper left breast.  Ultrasound is performed, showing a 6 x 6 x 8 mm slightly irregular hypoechoic mass at the 11 o'clock position of the left breast 5 cm from the nipple An 11 x 7 x 9 mm slightly irregular hypoechoic mass at the 2 o'clock position of the left breast 4 cm from the nipple is also present. No enlarged or abnormal appearing left axillary lymph nodes are identified.  IMPRESSION: Indeterminate 6 x 6 x 8 mm hypoechoic mass at the 11 o'clock position of the left breast and 11 x 7 x 9 mm hypoechoic mass in the 2 o'clock position of the left breast.  The 11 o'clock position mass probably represents the enhancing nodule identified on today's MRI.  Tissue sampling of both masses is recommended.  BI-RADS CATEGORY 4:  Suspicious abnormality - biopsy should be considered.  RECOMMENDATION: Ultrasound guided biopsies of both left breast masses.  The findings and recommendations were discussed with the patient and she desires to proceed with ultrasound guided left breast biopsies. These biopsies will be performed today but dictated in a separate report.  Results were also provided in writing at the conclusion of the visit.  If applicable, a reminder letter will be sent to the patient regarding the next appointment.   Original Report Authenticated By: Jasmine Schwartz, M.D.   [place] Lt Breast Bx W Loc Dev 1st Lesion Img Bx Spec US Guide  01/12/2013   *RADIOLOGY REPORT*  Clinical Data:  47 year old female with newly diagnosed right breast cancer.  Two left breast masses identified sonographically - 6 x 8 mm at the 11 o'clock position and 9 x 11 mm at the 2 o'clock position. For tissue sampling of the  6 x 8 mm mass at the 11 o'clock position.  ULTRASOUND GUIDED VACUUM ASSISTED CORE BIOPSY OF THE LEFT BREAST  Comparison: Previous exams.  I met with the patient and we discussed the procedure of ultrasound- guided biopsy, including benefits and alternatives.  We discussed the high likelihood of a successful procedure. We discussed the risks of the procedure including infection, bleeding, tissue injury, clip migration, and inadequate sampling.  Informed written consent was given.  Using sterile technique and 2% Lidocaine as local anesthetic, under direct ultrasound visualization, a12 gauge vacuum-assisteddevice was used to perform biopsy of the 6 x 8 mm hypoechoic mass at the 11 o'clock position of the left breast 5 cm from the nipple using a medial approach. At the conclusion of the procedure, a ribbon shaped tissue marker clip was deployed into the biopsy cavity. Follow-up 2-view mammogram was performed and dictated separately.  The usual time-out protocol was performed immediately prior to the procedure.  IMPRESSION: Ultrasound-guided biopsy of 6 x 8 mm hypoechoic mass  at the 11 o'clock position of the left breast.  No apparent complications.  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  Histology correlates with imaging findings.  The patient was contacted by phone on 01/12/2013 and these results given to her which she understood. Her questions were answered. The patient had no complaints with her biopsy site.  Recommend treatment plan.   Original Report Authenticated By: Jasmine Schwartz, M.D.   [place] Lt Breast Bx W Loc Dev Ea Add Lesion Img Bx Spec US Guide  01/12/2013   *RADIOLOGY REPORT*  Clinical Data:  47 year old female with newly diagnosed right breast cancer.  Two left breast masses identified sonographically - 6 x 8 mm at the 11 o'clock position and 9 x 11 mm at the 2 o'clock position. For tissue sampling of the 9 x 11 mm mass at the 2 o'clock position.   ULTRASOUND GUIDED VACUUM ASSISTED CORE BIOPSY OF THE LEFT BREAST  Comparison: Previous exams.  I met with the patient and we discussed the procedure of ultrasound- guided biopsy, including benefits and alternatives.  We discussed the high likelihood of a successful procedure. We discussed the risks of the procedure including infection, bleeding, tissue injury, clip migration, and inadequate sampling.  Informed written consent was given.  Using sterile technique and 2% Lidocaine as local anesthetic, under direct ultrasound visualization, a 12 gauge vacuum-assisteddevice was used to perform biopsy of the 9 x 11 mm hypoechoic mass at the 2 o'clock position of the left breast 4 cm from the nipple using a medial approach. At the conclusion of the procedure, a  tissue marker clip was deployed into the biopsy cavity.  Follow-up 2-view mammogram was performed and dictated separately.  The usual time-out protocol was performed immediately prior to the procedure.  IMPRESSION: Ultrasound-guided biopsy of 9 x 11 mm mass in the 2 o'clock position of the left breast.  No apparent complications.  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 Histology correlates with imaging findings.  The patient was contacted by phone on 01/12/2013 and these results given to her which she understood. Her questions were answered. The patient had no complaints with her biopsy site.  Recommend treatment plan.   Original Report Authenticated By: Jasmine Schwartz, M.D.    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 Patient is receiving neoadjuvant chemotherapy and her to therapy. She is receiving double HER-2 therapy with Herceptin and perjeta. She receives chemotherapy consisting of Taxotere and carboplatinum. All drugs are given every 3 weeks with day 2  Neulasta. She began her treatment on 01/16/2013. Thus far she is tolerating it well.  #2 patient is set up to see the genetic counselor in the next few weeks since she does have  unde breast cancer under the age of 36. We again today discussed the rationale for referring her to genetic counseling. 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  #3 left lower extremity edema: Likely secondary to chemotherapy.  #4 nail bed changes: secondary to chemotherapy patient is reassured   PLAN:   #1 patient is doing well after cycle 4 of her treatment.  #2 she'll be seen back in 2 weeks' time for cycle 5  of her chemotherapy.  All questions were answered. The patient knows to call the clinic with any problems, questions or concerns. We can certainly see the patient much sooner if necessary.  I spent 25 minutes counseling the patient face to face. The total time spent in the appointment was 30 minutes.    Jasmine Second, MD Medical/Oncology [facility] [phone] (beeper) [phone] (Office)

## 2013-04-09 ENCOUNTER — Telehealth: Payer: Self-pay | Admitting: Endocrinology

## 2013-04-09 MED ORDER — INSULIN LISPRO 100 UNIT/ML ~~LOC~~ SOLN: SUBCUTANEOUS | Status: DC

## 2013-04-09 NOTE — Telephone Encounter (Signed)
It is fine to rx for either 30 or 90 days.

## 2013-04-10 ENCOUNTER — Telehealth: Payer: Self-pay | Admitting: Endocrinology

## 2013-04-10 ENCOUNTER — Ambulatory Visit (HOSPITAL_BASED_OUTPATIENT_CLINIC_OR_DEPARTMENT_OTHER): Payer: BC Managed Care – PPO | Admitting: Oncology

## 2013-04-10 ENCOUNTER — Other Ambulatory Visit: Payer: BC Managed Care – PPO | Admitting: Lab

## 2013-04-10 ENCOUNTER — Encounter: Payer: Self-pay | Admitting: Oncology

## 2013-04-10 ENCOUNTER — Telehealth: Payer: Self-pay | Admitting: Oncology

## 2013-04-10 ENCOUNTER — Ambulatory Visit (HOSPITAL_BASED_OUTPATIENT_CLINIC_OR_DEPARTMENT_OTHER): Payer: BC Managed Care – PPO

## 2013-04-10 ENCOUNTER — Ambulatory Visit: Payer: BC Managed Care – PPO | Admitting: Oncology

## 2013-04-10 ENCOUNTER — Other Ambulatory Visit (HOSPITAL_BASED_OUTPATIENT_CLINIC_OR_DEPARTMENT_OTHER): Payer: BC Managed Care – PPO | Admitting: Lab

## 2013-04-10 VITALS — BP 118/72 | HR 109 | Temp 97.7°F | Resp 20 | Ht 64.0 in | Wt 147.8 lb

## 2013-04-10 DIAGNOSIS — C773 Secondary and unspecified malignant neoplasm of axilla and upper limb lymph nodes: Secondary | ICD-10-CM

## 2013-04-10 DIAGNOSIS — R609 Edema, unspecified: Secondary | ICD-10-CM

## 2013-04-10 DIAGNOSIS — C50911 Malignant neoplasm of unspecified site of right female breast: Secondary | ICD-10-CM

## 2013-04-10 DIAGNOSIS — Z171 Estrogen receptor negative status [ER-]: Secondary | ICD-10-CM

## 2013-04-10 DIAGNOSIS — C50919 Malignant neoplasm of unspecified site of unspecified female breast: Secondary | ICD-10-CM

## 2013-04-10 DIAGNOSIS — Z5112 Encounter for antineoplastic immunotherapy: Secondary | ICD-10-CM

## 2013-04-10 LAB — COMPREHENSIVE METABOLIC PANEL (CC13)
AST: 15 U/L (ref 5–34)
Albumin: 3 g/dL — ABNORMAL LOW (ref 3.5–5.0)
Alkaline Phosphatase: 60 U/L (ref 40–150)
BUN: 15.4 mg/dL (ref 7.0–26.0)
Potassium: 4.5 mEq/L (ref 3.5–5.1)
Sodium: 138 mEq/L (ref 136–145)
Total Protein: 5.8 g/dL — ABNORMAL LOW (ref 6.4–8.3)

## 2013-04-10 LAB — CBC WITH DIFFERENTIAL/PLATELET
EOS%: 0 % (ref 0.0–7.0)
MCH: 32.2 pg (ref 25.1–34.0)
MCV: 94.3 fL (ref 79.5–101.0)
MONO%: 2 % (ref 0.0–14.0)
RBC: 3.31 10*6/uL — ABNORMAL LOW (ref 3.70–5.45)
RDW: 17.5 % — ABNORMAL HIGH (ref 11.2–14.5)

## 2013-04-10 MED ORDER — LORAZEPAM 2 MG/ML IJ SOLN
0.5000 mg | Freq: Once | INTRAMUSCULAR | Status: AC
  Administered 2013-04-10: 0.5 mg via INTRAVENOUS

## 2013-04-10 MED ORDER — SODIUM CHLORIDE 0.9 % IJ SOLN
10.0000 mL | INTRAMUSCULAR | Status: DC | PRN
  Filled 2013-04-10: qty 10

## 2013-04-10 MED ORDER — DEXAMETHASONE SODIUM PHOSPHATE 20 MG/5ML IJ SOLN
20.0000 mg | Freq: Once | INTRAMUSCULAR | Status: AC
  Administered 2013-04-10: 20 mg via INTRAVENOUS

## 2013-04-10 MED ORDER — DIPHENHYDRAMINE HCL 25 MG PO CAPS
50.0000 mg | ORAL_CAPSULE | Freq: Once | ORAL | Status: AC
  Administered 2013-04-10: 50 mg via ORAL

## 2013-04-10 MED ORDER — ACETAMINOPHEN 325 MG PO TABS
650.0000 mg | ORAL_TABLET | Freq: Once | ORAL | Status: AC
  Administered 2013-04-10: 650 mg via ORAL

## 2013-04-10 MED ORDER — SODIUM CHLORIDE 0.9 % IV SOLN
420.0000 mg | Freq: Once | INTRAVENOUS | Status: AC
  Administered 2013-04-10: 420 mg via INTRAVENOUS
  Filled 2013-04-10: qty 14

## 2013-04-10 MED ORDER — GABAPENTIN 100 MG PO CAPS: 100.0000 mg | ORAL_CAPSULE | Freq: Two times a day (BID) | ORAL | Status: DC

## 2013-04-10 MED ORDER — ONDANSETRON 16 MG/50ML IVPB (CHCC)
16.0000 mg | Freq: Once | INTRAVENOUS | Status: AC
  Administered 2013-04-10: 16 mg via INTRAVENOUS

## 2013-04-10 MED ORDER — TRASTUZUMAB CHEMO INJECTION 440 MG
6.0000 mg/kg | Freq: Once | INTRAVENOUS | Status: AC
  Administered 2013-04-10: 399 mg via INTRAVENOUS
  Filled 2013-04-10: qty 19

## 2013-04-10 MED ORDER — SODIUM CHLORIDE 0.9 % IV SOLN
695.4000 mg | Freq: Once | INTRAVENOUS | Status: AC
  Administered 2013-04-10: 700 mg via INTRAVENOUS
  Filled 2013-04-10: qty 70

## 2013-04-10 MED ORDER — DOCETAXEL CHEMO INJECTION 160 MG/16ML
75.0000 mg/m2 | Freq: Once | INTRAVENOUS | Status: AC
  Administered 2013-04-10: 130 mg via INTRAVENOUS
  Filled 2013-04-10: qty 13

## 2013-04-10 MED ORDER — SODIUM CHLORIDE 0.9 % IV SOLN
Freq: Once | INTRAVENOUS | Status: AC
  Administered 2013-04-10: 14:00:00 via INTRAVENOUS

## 2013-04-10 MED ORDER — HEPARIN SOD (PORK) LOCK FLUSH 100 UNIT/ML IV SOLN
500.0000 [IU] | Freq: Once | INTRAVENOUS | Status: DC | PRN
  Filled 2013-04-10: qty 5

## 2013-04-10 NOTE — Telephone Encounter (Signed)
X °

## 2013-04-10 NOTE — Telephone Encounter (Signed)
Pt advisesd she can p/u humalog samples

## 2013-04-10 NOTE — Patient Instructions (Addendum)
[facility] Discharge Instructions for Patients Receiving Chemotherapy  Today you received the following chemotherapy agents:  Perjeta, Herceptin, Taxotere and Carboplatin.  To help prevent nausea and vomiting after your treatment, we encourage you to take your nausea medication as ordered per MD.   If you develop nausea and vomiting that is not controlled by your nausea medication, call the clinic.   BELOW ARE SYMPTOMS THAT SHOULD BE REPORTED IMMEDIATELY:  *FEVER GREATER THAN 100.5 F  *CHILLS WITH OR WITHOUT FEVER  NAUSEA AND VOMITING THAT IS NOT CONTROLLED WITH YOUR NAUSEA MEDICATION  *UNUSUAL SHORTNESS OF BREATH  *UNUSUAL BRUISING OR BLEEDING  TENDERNESS IN MOUTH AND THROAT WITH OR WITHOUT PRESENCE OF ULCERS  *URINARY PROBLEMS  *BOWEL PROBLEMS  UNUSUAL RASH Items with * indicate a potential emergency and should be followed up as soon as possible.  Feel free to call the clinic you have any questions or concerns. The clinic phone number is [phone].

## 2013-04-10 NOTE — Telephone Encounter (Signed)
Per 8/28 pof f/u as scheduled

## 2013-04-11 ENCOUNTER — Ambulatory Visit (HOSPITAL_BASED_OUTPATIENT_CLINIC_OR_DEPARTMENT_OTHER): Payer: BC Managed Care – PPO

## 2013-04-11 VITALS — BP 126/82 | HR 106 | Temp 97.9°F

## 2013-04-11 DIAGNOSIS — Z5189 Encounter for other specified aftercare: Secondary | ICD-10-CM

## 2013-04-11 DIAGNOSIS — C50911 Malignant neoplasm of unspecified site of right female breast: Secondary | ICD-10-CM

## 2013-04-11 DIAGNOSIS — C773 Secondary and unspecified malignant neoplasm of axilla and upper limb lymph nodes: Secondary | ICD-10-CM

## 2013-04-11 DIAGNOSIS — C50419 Malignant neoplasm of upper-outer quadrant of unspecified female breast: Secondary | ICD-10-CM

## 2013-04-11 MED ORDER — PEGFILGRASTIM INJECTION 6 MG/0.6ML
6.0000 mg | Freq: Once | SUBCUTANEOUS | Status: AC
  Administered 2013-04-11: 6 mg via SUBCUTANEOUS
  Filled 2013-04-11: qty 0.6

## 2013-04-14 ENCOUNTER — Other Ambulatory Visit: Payer: Self-pay | Admitting: Certified Registered Nurse Anesthetist

## 2013-04-15 ENCOUNTER — Ambulatory Visit (INDEPENDENT_AMBULATORY_CARE_PROVIDER_SITE_OTHER): Payer: BC Managed Care – PPO | Admitting: General Surgery

## 2013-04-17 ENCOUNTER — Ambulatory Visit
Admission: RE | Admit: 2013-04-17 | Discharge: 2013-04-17 | Disposition: A | Discharge status: Home / Self Care | Payer: BC Managed Care – PPO | Source: Ambulatory Visit | Attending: Oncology | Admitting: Oncology

## 2013-04-17 ENCOUNTER — Other Ambulatory Visit (HOSPITAL_BASED_OUTPATIENT_CLINIC_OR_DEPARTMENT_OTHER): Payer: BC Managed Care – PPO | Admitting: Lab

## 2013-04-17 ENCOUNTER — Ambulatory Visit (HOSPITAL_BASED_OUTPATIENT_CLINIC_OR_DEPARTMENT_OTHER): Payer: BC Managed Care – PPO | Admitting: Oncology

## 2013-04-17 VITALS — BP 123/79 | HR 118 | Temp 97.8°F | Resp 18 | Ht 64.0 in | Wt 137.4 lb

## 2013-04-17 DIAGNOSIS — C50419 Malignant neoplasm of upper-outer quadrant of unspecified female breast: Secondary | ICD-10-CM

## 2013-04-17 DIAGNOSIS — C50911 Malignant neoplasm of unspecified site of right female breast: Secondary | ICD-10-CM

## 2013-04-17 DIAGNOSIS — C50919 Malignant neoplasm of unspecified site of unspecified female breast: Secondary | ICD-10-CM

## 2013-04-17 LAB — CBC WITH DIFFERENTIAL/PLATELET
Basophils Absolute: 0.1 10*3/uL (ref 0.0–0.1)
Eosinophils Absolute: 0 10*3/uL (ref 0.0–0.5)
HGB: 12.2 g/dL (ref 11.6–15.9)
MCV: 93.9 fL (ref 79.5–101.0)
MONO#: 1.6 10*3/uL — ABNORMAL HIGH (ref 0.1–0.9)
MONO%: 11.9 % (ref 0.0–14.0)
NEUT#: 9.3 10*3/uL — ABNORMAL HIGH (ref 1.5–6.5)
Platelets: 213 10*3/uL (ref 145–400)
RBC: 3.84 10*6/uL (ref 3.70–5.45)
RDW: 16.5 % — ABNORMAL HIGH (ref 11.2–14.5)
WBC: 13.6 10*3/uL — ABNORMAL HIGH (ref 3.9–10.3)

## 2013-04-17 LAB — COMPREHENSIVE METABOLIC PANEL (CC13)
Albumin: 3.4 g/dL — ABNORMAL LOW (ref 3.5–5.0)
Alkaline Phosphatase: 90 U/L (ref 40–150)
BUN: 14.4 mg/dL (ref 7.0–26.0)
CO2: 26 mEq/L (ref 22–29)
Calcium: 8.9 mg/dL (ref 8.4–10.4)
Glucose: 213 mg/dl — ABNORMAL HIGH (ref 70–140)
Potassium: 4.2 mEq/L (ref 3.5–5.1)
Sodium: 131 mEq/L — ABNORMAL LOW (ref 136–145)
Total Protein: 6.1 g/dL — ABNORMAL LOW (ref 6.4–8.3)

## 2013-04-17 MED ORDER — GADOBENATE DIMEGLUMINE 529 MG/ML IV SOLN
12.0000 mL | Freq: Once | INTRAVENOUS | Status: AC | PRN
  Administered 2013-04-17: 12 mL via INTRAVENOUS

## 2013-04-17 NOTE — Patient Instructions (Addendum)
Doing well   We discussed you MRI results  I will see you back in 2 weeks

## 2013-04-20 ENCOUNTER — Ambulatory Visit (INDEPENDENT_AMBULATORY_CARE_PROVIDER_SITE_OTHER): Payer: BC Managed Care – PPO | Admitting: General Surgery

## 2013-04-20 ENCOUNTER — Encounter (INDEPENDENT_AMBULATORY_CARE_PROVIDER_SITE_OTHER): Payer: Self-pay | Admitting: General Surgery

## 2013-04-20 VITALS — BP 124/68 | HR 117 | Temp 96.4°F | Ht 64.0 in | Wt 137.0 lb

## 2013-04-20 DIAGNOSIS — E109 Type 1 diabetes mellitus without complications: Secondary | ICD-10-CM

## 2013-04-20 DIAGNOSIS — C50911 Malignant neoplasm of unspecified site of right female breast: Secondary | ICD-10-CM

## 2013-04-20 DIAGNOSIS — C50919 Malignant neoplasm of unspecified site of unspecified female breast: Secondary | ICD-10-CM

## 2013-04-20 NOTE — Progress Notes (Addendum)
Patient ID: Jasmine Schwartz, female   DOB: May 05, 1966, 47 y.o.   MRN: [id number] History: This patient returns for surgical evaluation during her neoadjuvant chemotherapy for right breast cancer. This 47 year old female was referred to me by Dr. [name] for a very large discolored right breast mass, and she was initially seen on 12/31/2012.  She had  clinically positive lymph nodes. 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 There was metastatic cancer in the lymph nodes. The tumor was ER and PR negative but Her-2 positive. She had 2 small densities in the left breast on MRI. They have been biopsied and showed fibroadenomas. PET CT showed no systemic metastasis. Port-A-Cath has been inserted and she is now had several  rounds of Herceptin-based chemotherapy, and her last preop treatment is scheduled 05/01/13.    She says there has been a dramatic improvement in the right breast.  She has decided she wants bilateral mastectomies, and delayed reconstruction. She has requested to see Dr. Etter Sjogren because one of her friends had reconstruction with him. She has an appointment with genetic counseling  coming up.  MRI performed on September 5 shows that the right breast mass has essentially resolved radiographically. The surrounding satellite masses have also resolved resolved. Skin is much thinner. The right axillary lymph nodes are improved.  Exam: Patient alert. No distress. Very pleasant. Husband is with her. Neck no adenopathy or mass Lungs clear to auscultation bilaterally. Port in the left infraclavicular area looks good. Right breast reveals dramatic response. Skin is normal. Nipple areolar are normal. Vague thickening upper outer quadrant. Axilla reveals nonpalpable nodes. Left breast is symmetrical. No palpable mass or skin change.  ROS: 10 system review of systems is negative except as  described above  Assessment: Locally advanced right breast cancer, receptor-negative, HER-2 positive, pretreatment clinical stage T3, N1  Abnormal MRI left breast, status post biopsy at 11:00 and 2:00 position revealing benign fibroadenomas  Excellent clinical and radiographic response to neoadjuvant Herceptin-based chemotherapy. 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 We will do this in mid October. She will see me around September 30 for preop evaluation I discussed the indications, details, techniques, and numerous risks of the surgery with her and her husband. They understand all these issues and all their questions were answered and they agree with this plan.  Later,  she'll be referred to plastic surgery, Dr. Etter Sjogren, later on. She knows that she will need to complete her chemotherapy and radiation therapy prior to beginning her reconstructive journey. We will involve Dr. [name] perioperatively for management of her type 1 diabetes.   Angelia Mould. Derrell Lolling, M.D., [facility] General and Minimally invasive Surgery Breast and Colorectal Surgery Office:   [phone] Pager:   [phone]

## 2013-04-20 NOTE — Patient Instructions (Signed)
You have had an excellent response to your neoadjuvant chemotherapy. Your examination reveals and the skin and nipple have returned to normal, and we feel slight thickening in the upper outer quadrant of the right breast.  We have talked about the extent of surgery, and you have told me that you have decided to have bilateral mastectomies.  We have talked about plastic surgery and reconstruction, and I have told you that should be done in a delayed fashion after all of your radiation therapy and chemotherapy is completed.  You have stated that she would like to see Dr. Etter Sjogren for reconstruction, and you'll be referred to him at some point in the future when you are ready to begin that consultation.  You'll be scheduled for right modified radical mastectomy and left simple mastectomy in October.  Return to see Dr. Derrell Lolling bone approximately September 30 for a preop check.     Mastectomy, With or Without Reconstruction Mastectomy (removal of the breast) is a procedure most commonly used to treat cancer (tumor) of the breast. Different procedures are available for treatment. This depends on the stage of the tumor (abnormal growths). Discuss this with your caregiver, surgeon (a specialist for performing operations such as this), or oncologist (someone specialized in the treatment of cancer). With proper information, you can decide which treatment is best for you. Although the sound of the word cancer is frightening to all of [place], the new treatments and medications can be a source of reassurance and comfort. If there are things you are worried about, discuss them with your caregiver. He or she can help comfort you and your family. Some of the different procedures for treating breast cancer are:  Radical (extensive) mastectomy. This is an operation used to remove the entire breast, the muscles under the breast, and all of the glands (lymph nodes) under the arm. With all of the new treatments  available for cancer of the breast, this procedure has become less common.  Modified radical mastectomy. This is a similar operation to the radical mastectomy described above. In the modified radical mastectomy, the muscles of the chest wall are not removed unless one of the lessor muscles is removed. One of the lessor muscles may be removed to allow better removal of the lymph nodes. The axillary lymph nodes are also removed. Rarely, during an axillary node dissection nerves to this area are damaged. Radiation therapy is then often used to the area following this surgery.  A total mastectomy also known as a complete or simple mastectomy. It involves removal of only the breast. The lymph nodes and the muscles are left in place.  In a lumpectomy, the lump is removed from the breast. This is the simplest form of surgical treatment. A sentinel lymph node biopsy may also be done. Additional treatment may be required. RISKS AND COMPLICATIONS The main problems that follow removal of the breast include:  Infection (germs start growing in the wound). This can usually be treated with antibiotics (medications that kill germs).  Lymphedema. This means the arm on the side of the breast that was operated on swells because the lymph (tissue fluid) cannot follow the main channels back into the body. This only occurs when the lymph nodes have had to be removed under the arm.  There may be some areas of numbness to the upper arm and around the incision (cut by the surgeon) in the breast. This happens because of the cutting of or damage to some of the nerves  in the area. This is most often unavoidable.  There may be difficulty moving the arm in a full range of motion (moving in all directions) following surgery. This usually improves with time following use and exercise.  Recurrence of breast cancer may happen with the very best of surgery and follow up treatment. Sometimes small cancer cells that cannot be seen  with the naked eye have already spread at the time of surgery. When this happens other treatment is available. This treatment may be radiation, medications or a combination of both. RECONSTRUCTION Reconstruction of the breast may be done immediately if there is not going to be post-operative radiation. This surgery is done for cosmetic (improve appearance) purposes to improve the physical appearance after the operation. This may be done in two ways:  It can be done using a saline filled prosthetic (an artificial breast which is filled with salt water). Silicone breast implants are now re-approved by the FDA and are being commonly used.  Reconstruction can be done using the body's own muscle/fat/skin. Your caregiver will discuss your options with you. Depending upon your needs or choice, together you will be able to determine which procedure is best for you. Document Released: 04/24/2001 Document Revised: 04/23/2012 Document Reviewed: 12/16/2007 [facility] Patient Information 2014 [place], [place].       Total or Modified Radical Mastectomy  Care After Refer to this sheet in the next few weeks. These instructions provide you with information on caring for yourself after your procedure. Your caregiver may also give you more specific instructions. Your treatment has been planned according to current medical practices, but problems sometimes occur. Call your caregiver if you have any problems or questions after your procedure. ACTIVITY  Your caregiver will advise you when you may resume strenuous activities, driving, and sports.  After the drain(s) are removed, you may do light housework. Avoid heavy lifting, carrying, or pushing. You should not be lifting anything heavier than 5 lbs.  Take frequent rest periods. You may tire more easily than usual.  Always rest and elevate the arm affected by your surgery for a period of time equal to your activity time.  Continue doing the exercises given to  you by the physical therapist/occupational therapist even after full range of motion has returned. The amount of time this takes will vary from person to person.  After normal range of motion has returned, some stiffness and soreness may persist for 2-3 months. This is normal and will subside.  Begin sports or strenuous activities in moderation. This will give you a chance to rebuild your endurance. Continue to be cautious of heavy lifting or carrying (no more than 10 lbs.) with your affected arm.  You may return to work as recommended by your caregiver. NUTRITION  You may resume your normal diet.  Make sure you drink plenty of fluids (6-8 glasses a day).  Eat a well-balanced diet. Including daily portions of food from government recommended food groups:  Grains.  Vegetables.  Fruits.  Milk.  Meat & beans.  Oils. Visit [employer] for more information HYGIENE  You may wash your hair.  If your incision (cut from surgery) is closed, you may shower or tub bathe, unless instructed otherwise by your doctor. FEVER  If you feel feverish or have shaking chills, take your temperature. If your temperature is 102 F (38.9 C) or above, call your caregiver. The fever may mean there is an infection.  If you call early, infection can be treated with antibiotics and  hospitalization may be avoided. PAIN CONTROL  Mild discomfort may occur.  You may need to take an over-the-counter pain medication or a medication prescribed by your caregiver.  Call your caregiver if you experience increased pain. INCISION CARE  Check your incision daily for increased redness, drainage, swelling, or separation of skin.  Call your caregiver if any of the above are noted. ARM AND HAND CARE  If the lymph nodes under your arm were removed with a modified radical mastectomy, there may be a greater tendency for the arm to swell.  Try to avoid having blood pressures taken, blood drawn, or  injections given in the affected arm. This is the arm on the same side as the surgery.  Use hand lotion to soften cuticles instead of cutting them to avoid cutting yourself.  Be careful when shaving your under arms. Use an electric shaver if possible. You may use a deodorant after the incision has completely healed. Until then, clean under your arms with hydrogen peroxide.  Use reasonable precaution when cooking, sewing, and gardening to avoid burning or needle or thorn pricks.  Do not weigh your arm straight down with a package or your purse.  Follow the exercises and instructions given to you by the physical therapist/occupational therapist and your caregiver. FOLLOW-UP APPOINTMENT Call your caregiver for a follow-up appointment as directed. PROSTHESIS INFORMATION Wear your temporary prosthesis (artificial breast) until your caregiver gives you permission to purchase a permanent one. This will depend upon your rate of healing. We suggest you also wait until you are physically and emotionally ready to shop for one. The suitability depends on several individual factors. We do not endorse any particular prosthesis, but suggest you try several until you are satisfied with appearance and fit. A list of stores may be obtained from your local [employer] at www.cancer.org or 1-800-ACS-2345 (1-580-732-8962). A permanent prosthesis is medically necessary to restore balance. It is also income tax deductible. Be sure all receipts are marked "surgical". It is not essential to purchase a bra. You may sew a pocket into your regular bra. Note: Remember to take all of your medical insurance information with you when shopping for your prosthesis. SELECTING A PROSTHESIS FITTER You may want to ask the following questions when selecting a fitter:  What styles and brands of forms are carried in stock?  How long have the forms been on the market and have there been any problems with them?  Why would  one form be better than another?  How long should a particular form last?  May I wear the form for a trial period without obligation?  Do the forms require a prosthetic bra? If so, what is the price range? Must I always wear that style?  If alterations to the bra are necessary, can they be done at this location or be sent out?  Will I be charged for alterations?  Will I receive suggestions on how to alter my own wardrobe, if necessary?  Will you special order forms or bras if necessary?  Are fitters always available to meet my needs?  What kinds of garments should be worn for the fitting?  Are lounge wear, swim wear, and accessories available?  If I have insurance coverage or Medicare, will you suggest ways for processing the paper work?  Do you keep complete records so that mail reordering is possible?  How are warranty claims handled if I have a problem with the form? Document Released: 03/22/2004 Document Revised: 10/22/2011 Document  Reviewed: 11/25/2007 [employer] Patient Information 2014 [place], [place].

## 2013-04-26 NOTE — Progress Notes (Signed)
OFFICE PROGRESS NOTE  CC  [name], MD [address] [place] [address] Dr. Lurline Hare  Dr. 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 At that time she had mammogram performed and it was negative. However her breast continue to increase in size. Initially patient felt that it could have been cysts or something on and therefore she did not go to a physician. However subsequently patient's breast became quite painful and she also began to feel in knot under her right arm.because of this she went to her physician who did a physical exam and a mammogram was performed on 01/01/2013 the mammogram showed diffuse skin thickening generalized increased density within the right breast and enlarged right axillary lymph nodes. She went on to have an ultrasound of the right breast performed that showed a large spiculated hypoechoic mass in the right breast 11:00 2 cm from the nipple measuring at least 4.5 cm in size. There were abnormal lymph nodes with thickened cortex and the right axilla the largest one measuring 3.1 cm in dimension.  #2Patient was recommended ultrasound guided core biopsy of the right breast mass and the right axilla lymph node. The biopsy was performed on 01/01/2013. The right needle core biopsy of the primary mass showed invasive mammary carcinoma grade 2-3 was invasive ductal. The lymph node of the right axilla was positive for metastatic disease. The tumor was ER PR negative HER-2/neu positive with a Ki-67 of 76%. On 01/09/2013 patient had MRI of the breasts performed that showed diffuse right breast skin thickening and edema with right axillary lymph node. There was a 5 mm internal mammary lymph node that  was incompletely imaged. The right breast mass measured 8 x 5 x 4.7 cm. There were abnormal areas involving all 4 quadrants. There was however no involvement of the pectoralis minor for lower chest wall. The left breast showed 2 areas of nodular enhancement these were biopsied and were fibroadenomas  #3 it was recommended that patient begin neoadjuvant chemotherapy consisting of Taxotere carboplatinum Herceptin and perjeta. This would be given every 3 weeks with day 2 Neulasta. A total of 6 cycles of therapy were planned. Once she completes the treatment then she would proceed with MRI of the breasts again for evaluation a response to therapy and eventually a mastectomy.  #4 patient began Neoadjuvant with curative intentTaxotere carboplatinum Herceptin and perjeta starting 01/16/2013.  CURRENT THERAPY: Here for Neoadjuvant cycle 5 TCH/perjeta with day 2 Neulasta   INTERVAL HISTORY: Pearlina Friedly 47 y.o. female returns for followup visit today overall she is doing well she is without any complaints. Overall she's doing well she is tolerating her chemotherapy very nicely without any problems. Her blood work looks Community education officer. She denies any nausea vomiting fevers chills night sweats headaches shortness of breath chest pains or palpitations no peripheral paresthesias. No myalgias and arthralgias. Remainder of the 10 point review of systems is negative.  MEDICAL HISTORY: Past Medical History  Diagnosis Date  . Anxiety   . Depression   . Dyslipidemia   . Anxiety and depression 07/06/2011  . Breast cancer 01/01/13    /metastatic 1/1 lymph nodes  . Diabetes mellitus     IDDM  . Hx of insertion of  insulin pump     ALLERGIES:  has No Known Allergies.  MEDICATIONS:  Current Outpatient Prescriptions  Medication Sig Dispense Refill  . ALPRAZolam (XANAX) 0.25 MG tablet Take 0.25 mg by mouth 3 (three) times daily as needed for sleep or anxiety.      . Alum & Mag Hydroxide-Simeth (MAGIC MOUTHWASH  W/LIDOCAINE) SOLN Take 5 mLs by mouth 4 (four) times daily as needed. Swish and spit.  120 mL  1  . dexamethasone (DECADRON) 4 MG tablet Take 2 tablets (8 mg total) by mouth 2 (two) times daily with a meal. Take two times a day the day before Taxotere. Then take two times a day starting the day after chemo for 3 days.  40 tablet  3  . fluconazole (DIFLUCAN) 200 MG tablet Take 1 tablet (200 mg total) by mouth daily.  5 tablet  5  . gabapentin (NEURONTIN) 100 MG capsule Take 1 capsule (100 mg total) by mouth 2 (two) times daily.  60 capsule  6  . glucose blood (NOVA MAX TEST) test strip Use as directed up to 8 times a day      . Insulin Infusion Pump (MINIMED INSULIN PUMP) DEVI Inject 1 Units/hr into the vein continuous. Pt give extra per cards at meal times. Basic meals are about 5 units      . Insulin Infusion Pump Supplies (MINIMED INFUSION SET-MMT 397) MISC 1 Device by Does not apply route every 3 (three) days.      . Insulin Infusion Pump Supplies (PARADIGM PUMP RESERVOIR 1.76ML) MISC 1 Device by Does not apply route every 3 (three) days.      . insulin lispro (HUMALOG) 100 UNIT/ML injection Inject as directed into Insulin pump, total of 40 units/day  20 mL  2  . lidocaine-prilocaine (EMLA) cream Apply topically as needed. Apply to port-a-cath site 1-2 hours before treatment.  30 g  3  . LORazepam (ATIVAN) 0.5 MG tablet Take 1 tablet (0.5 mg total) by mouth every 6 (six) hours as needed (Nausea or vomiting).  30 tablet  2  . ondansetron (ZOFRAN) 8 MG tablet Take 8 mg by mouth 2 (two) times daily. Take two times a day starting the day after chemo for 3 days. Then take two times a day as needed for nausea or vomiting.      . prochlorperazine (COMPAZINE) 10 MG tablet Take 1 tablet (10 mg total) by mouth every 6 (six) hours as needed (Nausea or vomiting).  30 tablet  1  . prochlorperazine (COMPAZINE) 25 MG suppository Place 25 mg rectally every 12 (twelve) hours as needed for nausea.      . sucralfate  (CARAFATE) 1 GM/10ML suspension Take 10 mLs (1 g total) by mouth 4 (four) times daily.  420 mL  6  . valACYclovir (VALTREX) 500 MG tablet Take 1 tablet (500 mg total) by mouth 2 (two) times daily.  30 tablet  4   No current facility-administered medications for this visit.    SURGICAL HISTORY:  Past Surgical History  Procedure Laterality Date  . Lumbar spine surgery  2009    Dr Jillyn Hidden  . Cesarean section  1989  . Tubal ligation  1991  . Breast biopsy Right 01/01/13    Invasive mammary ca,metastatic in 1/1 lymph node  . Breast biopsy Left 01/09/13    Fibroadenoma,microcalcifications    REVIEW OF SYSTEMS:  Pertinent items are noted in HPI.   HEALTH MAINTENANCE:  PHYSICAL EXAMINATION: Blood pressure 118/72, pulse 109, temperature  97.7 F (36.5 C), temperature source Oral, resp. rate 20, height 5\' 4"  (1.626 m), weight 147 lb 12.8 oz (67.042 kg). Body mass index is 25.36 kg/(m^2). ECOG PERFORMANCE STATUS: 0 - Asymptomatic   General appearance: alert, cooperative and appears stated age Resp: clear to auscultation bilaterally Cardio: regular rate and rhythm GI: soft, non-tender; bowel sounds normal; no masses,  no organomegaly Extremities: extremities normal, atraumatic, no cyanosis or edema Neurologic: Grossly normal Right breast reveals a large palpable mass without any nipple discharge it is softer Left breast no masses or nipple discharge  LABORATORY DATA: Lab Results  Component Value Date   WBC 13.6* 04/17/2013   HGB 12.2 04/17/2013   HCT 36.1 04/17/2013   MCV 93.9 04/17/2013   PLT 213 04/17/2013      Chemistry      Component Value Date/Time   NA 131* 04/17/2013 1233   NA 140 03/31/2012 0934   K 4.2 04/17/2013 1233   K 4.7 03/31/2012 0934   CL 99 01/23/2013 1251   CL 106 03/31/2012 0934   CO2 26 04/17/2013 1233   CO2 27 03/31/2012 0934   BUN 14.4 04/17/2013 1233   BUN 13 03/31/2012 0934   CREATININE 0.7 04/17/2013 1233   CREATININE 0.6 03/31/2012 0934      Component Value Date/Time    CALCIUM 8.9 04/17/2013 1233   CALCIUM 8.9 03/31/2012 0934   ALKPHOS 90 04/17/2013 1233   ALKPHOS 42 03/31/2012 0934   AST 14 04/17/2013 1233   AST 21 03/31/2012 0934   ALT 15 04/17/2013 1233   ALT 15 03/31/2012 0934   BILITOT 0.74 04/17/2013 1233   BILITOT 0.7 03/31/2012 0934       RADIOGRAPHIC STUDIES:  Ct Chest W Contrast  01/20/2013   *RADIOLOGY REPORT*  Clinical Data:  High risk breast cancer.  Evaluate for metastatic disease.  CT CHEST, ABDOMEN AND PELVIS WITH CONTRAST  Technique:  Multidetector CT imaging of the chest, abdomen and pelvis was performed following the standard protocol during bolus administration of intravenous contrast.  Contrast: OMNIPAQUE IOHEXOL 300 MG/ML  SOLN  Comparison:  None  CT CHEST  Findings:  Lungs/pleura: There is no pleural effusion.  No suspicious pulmonary nodule or mass noted.  Heart/Mediastinum: Heart size is normal.  No pericardial effusion. No mediastinal or hilar adenopathy.  Bones/Musculoskeletal:  Asymmetric increased soft tissue within the right breast parenchyma corresponding to patient's biopsy-proven invasive breast carcinoma.  There is associated overlying skin thickening.  Multiple enlarged right axillary lymph nodes are identified.  Index lymph node measures 1.7 cm, image 23/series 2. Several enhancing, sub centimeter retropectoral lymph nodes are identified.  IMPRESSION:  1.  No acute findings. 2.  Right breast invasive carcinoma with associated right axillary adenopathy.  CT ABDOMEN AND PELVIS  Findings:  There are no focal liver abnormalities identified.  The gallbladder appears normal.  There is no biliary dilatation.  The pancreas is unremarkable.  Normal appearance of the spleen.  The adrenal glands are both normal.  The kidneys are unremarkable. Urinary bladder appears normal.  The uterus is unremarkable. Corpus luteal cyst is noted in the left ovary measuring 1.6 cm.  No free fluid or fluid collections identified within the upper abdomen or pelvis.   The abdominal aorta has a normal caliber. There is no adenopathy identified within the upper abdomen.  No pelvic or inguinal adenopathy noted.  The stomach is normal.  The small bowel loops are unremarkable. Normal appearance of the colon.  Review of  the visualized osseous structures is negative for aggressive lytic or sclerotic bone lesion.  IMPRESSION:  1.  No acute findings within the abdomen or pelvis. 2.  No mass or adenopathy.   Original Report Authenticated By: Signa Kell, M.D.   US Breast Left  01/09/2013   *RADIOLOGY REPORT*  Clinical Data:  47 year old female with newly diagnosed right breast cancer.  Left mammogram to compare to presurgical MRI.  DIGITAL DIAGNOSTIC LEFT MAMMOGRAM WITH CAD AND LEFT BREAST ULTRASOUND:  Comparison:  06/11/2012 mammogram and 01/09/2013 MRI  Findings:  ACR Breast Density Category 3: The breast tissue is heterogeneously dense.  A possible circumscribed mass in the upper outer left breast is identified. No suspicious calcifications or distortion identified.  Mammographic images were processed with CAD.  On physical exam, no palpable abnormalities identified within the upper left breast.  Ultrasound is performed, showing a 6 x 6 x 8 mm slightly irregular hypoechoic mass at the 11 o'clock position of the left breast 5 cm from the nipple An 11 x 7 x 9 mm slightly irregular hypoechoic mass at the 2 o'clock position of the left breast 4 cm from the nipple is also present. No enlarged or abnormal appearing left axillary lymph nodes are identified.  IMPRESSION: Indeterminate 6 x 6 x 8 mm hypoechoic mass at the 11 o'clock position of the left breast and 11 x 7 x 9 mm hypoechoic mass in the 2 o'clock position of the left breast.  The 11 o'clock position mass probably represents the enhancing nodule identified on today's MRI.  Tissue sampling of both masses is recommended.  BI-RADS CATEGORY 4:  Suspicious abnormality - biopsy should be considered.  RECOMMENDATION: Ultrasound guided  biopsies of both left breast masses.  The findings and recommendations were discussed with the patient and she desires to proceed with ultrasound guided left breast biopsies. These biopsies will be performed today but dictated in a separate report.  Results were also provided in writing at the conclusion of the visit.  If applicable, a reminder letter will be sent to the patient regarding the next appointment.   Original Report Authenticated By: Harmon Pier, M.D.   Ct Abdomen Pelvis W Contrast  01/22/2013   *RADIOLOGY REPORT*  Clinical Data:  High risk breast cancer.  Evaluate for metastatic disease.  CT CHEST, ABDOMEN AND PELVIS WITH CONTRAST  Technique:  Multidetector CT imaging of the chest, abdomen and pelvis was performed following the standard protocol during bolus administration of intravenous contrast.  Contrast: OMNIPAQUE IOHEXOL 300 MG/ML  SOLN  Comparison:  None  CT CHEST  Findings:  Lungs/pleura: There is no pleural effusion.  No suspicious pulmonary nodule or mass noted.  Heart/Mediastinum: Heart size is normal.  No pericardial effusion. No mediastinal or hilar adenopathy.  Bones/Musculoskeletal:  Asymmetric increased soft tissue within the right breast parenchyma corresponding to patient's biopsy-proven invasive breast carcinoma.  There is associated overlying skin thickening.  Multiple enlarged right axillary lymph nodes are identified.  Index lymph node measures 1.7 cm, image 23/series 2. Several enhancing, sub centimeter retropectoral lymph nodes are identified.  IMPRESSION:  1.  No acute findings. 2.  Right breast invasive carcinoma with associated right axillary adenopathy.  CT ABDOMEN AND PELVIS  Findings:  There are no focal liver abnormalities identified.  The gallbladder appears normal.  There is no biliary dilatation.  The pancreas is unremarkable.  Normal appearance of the spleen.  The adrenal glands are both normal.  The kidneys are unremarkable. Urinary bladder appears normal.  The  uterus is unremarkable. Corpus luteal cyst is noted in the left ovary measuring 1.6 cm.  No free fluid or fluid collections identified within the upper abdomen or pelvis.  The abdominal aorta has a normal caliber. There is no adenopathy identified within the upper abdomen.  No pelvic or inguinal adenopathy noted.  The stomach is normal.  The small bowel loops are unremarkable. Normal appearance of the colon.  Review of the visualized osseous structures is negative for aggressive lytic or sclerotic bone lesion.  IMPRESSION:  1.  No acute findings within the abdomen or pelvis. 2.  No mass or adenopathy.   Original Report Authenticated By: Signa Kell, M.D.   Mr Breast Bilateral W Wo Contrast  01/09/2013   *RADIOLOGY REPORT*  Clinical Data: Biopsy-proven right breast inflammatory carcinoma with primary invasive mammary carcinoma and previous biopsy and known right axillary metastatic disease.  Preoperative staging MRI.  BUN and creatinine were obtained on site at [facility] at [address] Results:  BUN 10 mg/dL,  Creatinine 0.9 mg/dL.  BILATERAL BREAST MRI WITH AND WITHOUT CONTRAST  Technique: Multiplanar, multisequence MR images of both breasts were obtained prior to and following the intravenous administration of 13ml of Multihance.  Three dimensional images were evaluated at the independent DynaCad workstation.  Comparison:  Prior mammograms and ultrasound.  The patient underwent diagnostic mammography and ultrasound of the left breast today as no recent prior exam was available after the recent diagnosis in the right breast.  Findings: There is diffuse right breast skin thickening and edema, trabecular edema, and right axillary lymphadenopathy with maximal nodal measurement 3.1 cm short-axis diameter.  There is also a 5 mm internal mammary artery chain lymph node identified image 26 series 2. There is an incompletely visualized lymph node subjacent to the pectoralis minor muscle on the first  image of the T2-weighted series.  There is a large irregular enhancing mass in the right breast 11 o'clock location with associated clip artifact measuring maximally 8.0 x 5.6 by 4.7 cm. This demonstrates washin/washout type enhancement kinetics.  This corresponds to the biopsy-proven invasive mammary carcinoma.  There are abnormal nodular areas of trabecular has been throughout the right breast involving all four quadrants. No abnormal underlying pectoralis enhancement or chest wall involvement is identified.  In the left breast 11 o'clock location, there is an 8 mm enhancing nodule corresponding to that seen at the examination earlier today. This area demonstrates plateau type enhancement kinetics.  No left- sided axillary or internal mammary artery chain lymphadenopathy is identified.  There is mild inhomogeneous nodular enhancement in the left breast two o'clock location at the site of the mass seen in this location earlier today, measuring 9 mm, measuring progressive enhancement kinetics.  IMPRESSION: Large irregular enhancing right breast mass 11 o'clock location corresponding to biopsy-proven invasive mammary carcinoma, with metastatic right axillary lymphadenopathy as well as skin and trabecular thickening/enhancement compatible with the clinical diagnosis of inflammatory and/or locally advanced breast cancer.  5 mm abnormal right internal mammary artery chain lymph node.  8 mm enhancing mass left breast 11 o'clock location and mildly irregular area of mass-like enhancement in the left breast two o'clock location, corresponding to those seen at diagnostic examination earlier today.  Biopsy has been scheduled for today and will be dictated separately.  RECOMMENDATION: Treatment plan  THREE-DIMENSIONAL MR IMAGE RENDERING ON INDEPENDENT WORKSTATION:  Three-dimensional MR images were rendered by post-processing of the original MR data on an independent workstation.  The three- dimensional  MR images were  interpreted, and findings were reported in the accompanying complete MRI report for this study.  BI-RADS CATEGORY 6:  Known biopsy-proven malignancy - appropriate action should be taken.   Original Report Authenticated By: Christiana Pellant, M.D.   Nm Pet Image Initial (pi) Skull Base To Thigh  01/20/2013   *RADIOLOGY REPORT*  Clinical Data: Initial treatment strategy for breast cancer.  NUCLEAR MEDICINE PET SKULL BASE TO THIGH  Fasting Blood Glucose:  263  Technique:  19.1 mCi F-18 FDG was injected intravenously. CT data was obtained and used for attenuation correction and anatomic localization only.  (This was not acquired as a diagnostic CT examination.) Additional exam technical data entered on technologist worksheet.  Comparison:  None  Findings:  Neck: No hypermetabolic lymph nodes in the neck.  Chest:  No hypermetabolic mediastinal or hilar nodes.  No suspicious pulmonary nodules on the CT scan. Enlarged right axillary lymph nodes identified.  Index lymph node measures 1.7 cm and has an SUV max equal to 2.8, image 77.  There is asymmetric increased uptake throughout the right breast with associated overlying skin thickening.  This has an SUV max equal to 4.4, image 101.  Abdomen/Pelvis:  No abnormal hypermetabolic activity within the liver, pancreas, adrenal glands, or spleen.  No hypermetabolic lymph nodes in the abdomen or pelvis. There is a small focus of increased FDG uptake within the left ovary.  This has an SUV max equal to 13.1, image 217.  Skeleton:  No focal hypermetabolic activity to suggest skeletal metastasis.  IMPRESSION:  1.  Asymmetric increased uptake associated with the right breast and right breast skin thickening corresponding to biopsy-proven invasive breast cancer. 2.  Enlarged and hypermetabolic right axillary lymph nodes consistent with metastatic adenopathy. 3.  No evidence for distant metastatic disease. 4.  Focal area of increased uptake within the left axilla.  The patient is noted  to have a corpus luteal cyst are not diagnostic CT images.  This activity is favored to represent physiologic activity.  Consider confirmatory imaging with pelvic sonogram.   Original Report Authenticated By: Signa Kell, M.D.   Ir Fluoro Guide Cv Line Left  01/15/2013   *RADIOLOGY REPORT*  Clinical Data/Indication: Breast cancer  TUNNEL POWER PORT PLACEMENT WITH SUBCUTANEOUS POCKET UTILIZING ULTRASOUND & FLOUROSCOPY  Sedation: Versed 5.0 mg, Fentanyl 250 mcg.  Total Moderate Sedation Time: 35 minutes.  As antibiotic prophylaxis, Ancef  was ordered pre-procedure and administered intravenously within one hour of incision.  Fluoroscopy Time: 36 seconds.  Procedure:  After written informed consent was obtained, patient was placed in the supine position on angiographic table. The left neck and chest was prepped and draped in a sterile fashion. Lidocaine was utilized for local anesthesia.  The left internal jugular vein was noted to be patent initially with ultrasound. Under sonographic guidance, a micropuncture needle was inserted into the left IJ vein (Ultrasound and fluoroscopic image documentation was performed). The needle was removed over an 018 wire which was exchanged for a Amplatz.  This was advanced into the IVC.  An 8-French dilator was advanced over the Amplatz.  A small incision was made in the left upper chest over the anterior left second rib.  Utilizing blunt dissection, a subcutaneous pocket was created in the caudal direction. The pocket was irrigated with a copious amount of sterile normal saline.  The port catheter was tunneled from the chest incision, and out the neck incision.  The reservoir was inserted into the subcutaneous pocket and secured  with two 3-0 Ethilon stitches.  A peel-away sheath was advanced over the Amplatz wire.  The port catheter was cut to measure length and inserted through the peel-away sheath.  The peel-away sheath was removed.  The chest incision was closed with 3-0  Vicryl interrupted stitches for the subcutaneous tissue and a running of 4- 0 Vicryl subcuticular stitch for the skin.  The neck incision was closed with a 4-0 Vicryl subcuticular stitch.  Derma-bond was applied to both surgical incisions.  The port reservoir was flushed and instilled with heparinized saline.  No complications.  Findings:  A left IJ vein Port-A-Cath is in place with its tip at the cavoatrial junction.  IMPRESSION: Successful 8 French left internal jugular vein power port placement with its tip at the SVC/RA junction.   Original Report Authenticated By: Jolaine Click, M.D.   Ir US Guide Vasc Access Left  01/15/2013   *RADIOLOGY REPORT*  Clinical Data/Indication: Breast cancer  TUNNEL POWER PORT PLACEMENT WITH SUBCUTANEOUS POCKET UTILIZING ULTRASOUND & FLOUROSCOPY  Sedation: Versed 5.0 mg, Fentanyl 250 mcg.  Total Moderate Sedation Time: 35 minutes.  As antibiotic prophylaxis, Ancef  was ordered pre-procedure and administered intravenously within one hour of incision.  Fluoroscopy Time: 36 seconds.  Procedure:  After written informed consent was obtained, patient was placed in the supine position on angiographic table. The left neck and chest was prepped and draped in a sterile fashion. Lidocaine was utilized for local anesthesia.  The left internal jugular vein was noted to be patent initially with ultrasound. Under sonographic guidance, a micropuncture needle was inserted into the left IJ vein (Ultrasound and fluoroscopic image documentation was performed). The needle was removed over an 018 wire which was exchanged for a Amplatz.  This was advanced into the IVC.  An 8-French dilator was advanced over the Amplatz.  A small incision was made in the left upper chest over the anterior left second rib.  Utilizing blunt dissection, a subcutaneous pocket was created in the caudal direction. The pocket was irrigated with a copious amount of sterile normal saline.  The port catheter was tunneled from the  chest incision, and out the neck incision.  The reservoir was inserted into the subcutaneous pocket and secured with two 3-0 Ethilon stitches.  A peel-away sheath was advanced over the Amplatz wire.  The port catheter was cut to measure length and inserted through the peel-away sheath.  The peel-away sheath was removed.  The chest incision was closed with 3-0 Vicryl interrupted stitches for the subcutaneous tissue and a running of 4- 0 Vicryl subcuticular stitch for the skin.  The neck incision was closed with a 4-0 Vicryl subcuticular stitch.  Derma-bond was applied to both surgical incisions.  The port reservoir was flushed and instilled with heparinized saline.  No complications.  Findings:  A left IJ vein Port-A-Cath is in place with its tip at the cavoatrial junction.  IMPRESSION: Successful 8 French left internal jugular vein power port placement with its tip at the SVC/RA junction.   Original Report Authenticated By: Jolaine Click, M.D.   Mm Digital Diagnostic Unilat L  01/09/2013   *RADIOLOGY REPORT*  Clinical Data:  Evaluate clip placement following two separate ultrasound-guided left breast biopsies.  DIGITAL DIAGNOSTIC LEFT MAMMOGRAM  Comparison:  Previous exams  Findings:  Films are performed following two separate ultrasound guided biopsies of the left breast - at the 11 o'clock position and 2 o'clock position. The ribbon shaped biopsy clip corresponds to the 11 o'clock mass  and the T shaped biopsy clip corresponds to the 2 o'clock mass. No immediate complications are identified.  IMPRESSION: Satisfactory clip placement following ultrasound guided left breast biopsies.  Pathology will be followed.   Original Report Authenticated By: Harmon Pier, M.D.   Mm Digital Diagnostic Unilat L  01/09/2013   *RADIOLOGY REPORT*  Clinical Data:  47 year old female with newly diagnosed right breast cancer.  Left mammogram to compare to presurgical MRI.  DIGITAL DIAGNOSTIC LEFT MAMMOGRAM WITH CAD AND LEFT BREAST  ULTRASOUND:  Comparison:  06/11/2012 mammogram and 01/09/2013 MRI  Findings:  ACR Breast Density Category 3: The breast tissue is heterogeneously dense.  A possible circumscribed mass in the upper outer left breast is identified. No suspicious calcifications or distortion identified.  Mammographic images were processed with CAD.  On physical exam, no palpable abnormalities identified within the upper left breast.  Ultrasound is performed, showing a 6 x 6 x 8 mm slightly irregular hypoechoic mass at the 11 o'clock position of the left breast 5 cm from the nipple An 11 x 7 x 9 mm slightly irregular hypoechoic mass at the 2 o'clock position of the left breast 4 cm from the nipple is also present. No enlarged or abnormal appearing left axillary lymph nodes are identified.  IMPRESSION: Indeterminate 6 x 6 x 8 mm hypoechoic mass at the 11 o'clock position of the left breast and 11 x 7 x 9 mm hypoechoic mass in the 2 o'clock position of the left breast.  The 11 o'clock position mass probably represents the enhancing nodule identified on today's MRI.  Tissue sampling of both masses is recommended.  BI-RADS CATEGORY 4:  Suspicious abnormality - biopsy should be considered.  RECOMMENDATION: Ultrasound guided biopsies of both left breast masses.  The findings and recommendations were discussed with the patient and she desires to proceed with ultrasound guided left breast biopsies. These biopsies will be performed today but dictated in a separate report.  Results were also provided in writing at the conclusion of the visit.  If applicable, a reminder letter will be sent to the patient regarding the next appointment.   Original Report Authenticated By: Harmon Pier, M.D.   [place] Lt Breast Bx W Loc Dev 1st Lesion Img Bx Spec US Guide  01/12/2013   *RADIOLOGY REPORT*  Clinical Data:  47 year old female with newly diagnosed right breast cancer.  Two left breast masses identified sonographically - 6 x 8 mm at the 11 o'clock position  and 9 x 11 mm at the 2 o'clock position. For tissue sampling of the 6 x 8 mm mass at the 11 o'clock position.  ULTRASOUND GUIDED VACUUM ASSISTED CORE BIOPSY OF THE LEFT BREAST  Comparison: Previous exams.  I met with the patient and we discussed the procedure of ultrasound- guided biopsy, including benefits and alternatives.  We discussed the high likelihood of a successful procedure. We discussed the risks of the procedure including infection, bleeding, tissue injury, clip migration, and inadequate sampling.  Informed written consent was given.  Using sterile technique and 2% Lidocaine as local anesthetic, under direct ultrasound visualization, a12 gauge vacuum-assisteddevice was used to perform biopsy of the 6 x 8 mm hypoechoic mass at the 11 o'clock position of the left breast 5 cm from the nipple using a medial approach. At the conclusion of the procedure, a ribbon shaped tissue marker clip was deployed into the biopsy cavity. Follow-up 2-view mammogram was performed and dictated separately.  The usual time-out protocol was performed immediately prior to the  procedure.  IMPRESSION: Ultrasound-guided biopsy of 6 x 8 mm hypoechoic mass at the 11 o'clock position of the left breast.  No apparent complications.  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  Histology correlates with imaging findings.  The patient was contacted by phone on 01/12/2013 and these results given to her which she understood. Her questions were answered. The patient had no complaints with her biopsy site.  Recommend treatment plan.   Original Report Authenticated By: Harmon Pier, M.D.   [place] Lt Breast Bx W Loc Dev Ea Add Lesion Img Bx Spec US Guide  01/12/2013   *RADIOLOGY REPORT*  Clinical Data:  47 year old female with newly diagnosed right breast cancer.  Two left breast masses identified sonographically - 6 x 8 mm at the 11 o'clock position and 9 x 11 mm at the 2 o'clock position.  For tissue sampling of the 9 x 11 mm mass at the 2 o'clock position.  ULTRASOUND GUIDED VACUUM ASSISTED CORE BIOPSY OF THE LEFT BREAST  Comparison: Previous exams.  I met with the patient and we discussed the procedure of ultrasound- guided biopsy, including benefits and alternatives.  We discussed the high likelihood of a successful procedure. We discussed the risks of the procedure including infection, bleeding, tissue injury, clip migration, and inadequate sampling.  Informed written consent was given.  Using sterile technique and 2% Lidocaine as local anesthetic, under direct ultrasound visualization, a 12 gauge vacuum-assisteddevice was used to perform biopsy of the 9 x 11 mm hypoechoic mass at the 2 o'clock position of the left breast 4 cm from the nipple using a medial approach. At the conclusion of the procedure, a  tissue marker clip was deployed into the biopsy cavity.  Follow-up 2-view mammogram was performed and dictated separately.  The usual time-out protocol was performed immediately prior to the procedure.  IMPRESSION: Ultrasound-guided biopsy of 9 x 11 mm mass in the 2 o'clock position of the left breast.  No apparent complications.  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 Histology correlates with imaging findings.  The patient was contacted by phone on 01/12/2013 and these results given to her which she understood. Her questions were answered. The patient had no complaints with her biopsy site.  Recommend treatment plan.   Original Report Authenticated By: Harmon Pier, M.D.    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 Patient is receiving neoadjuvant chemotherapy and her to therapy. She is receiving double HER-2 therapy with Herceptin and perjeta. She receives chemotherapy consisting of Taxotere  and carboplatinum. All drugs are given every 3 weeks with day 2 Neulasta. She began her treatment on 01/16/2013. Thus far she is tolerating it well.  #2 patient is set up to see the genetic counselor in the next few weeks since she does have  unde breast cancer under the age of 52. We again today discussed the rationale for referring her to genetic counseling. 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  #3 left lower extremity edema: Likely secondary to chemotherapy.  #4 nail bed changes: secondary to chemotherapy patient is reassured   PLAN:   #1 patient will proceed with cycle #5 of TCH/perjeta  #2 she  will need MRI of the breasts performed and will get her set up for these.  #3 she will be seen back in one week's time for interval lab and visit  All questions were answered. The patient knows to call the clinic with any problems, questions or concerns. We can certainly see the patient much sooner if necessary.  I spent 25 minutes counseling the patient face to face. The total time spent in the appointment was 30 minutes.    Drue Second, MD Medical/Oncology [facility] [phone] (beeper) [phone] (Office)

## 2013-05-01 ENCOUNTER — Ambulatory Visit (HOSPITAL_BASED_OUTPATIENT_CLINIC_OR_DEPARTMENT_OTHER): Payer: BC Managed Care – PPO | Admitting: Oncology

## 2013-05-01 ENCOUNTER — Other Ambulatory Visit (HOSPITAL_BASED_OUTPATIENT_CLINIC_OR_DEPARTMENT_OTHER): Payer: BC Managed Care – PPO | Admitting: Lab

## 2013-05-01 ENCOUNTER — Ambulatory Visit (HOSPITAL_BASED_OUTPATIENT_CLINIC_OR_DEPARTMENT_OTHER): Payer: BC Managed Care – PPO

## 2013-05-01 VITALS — BP 125/78 | HR 108 | Temp 97.6°F | Resp 20 | Ht 64.0 in | Wt 150.5 lb

## 2013-05-01 DIAGNOSIS — C50419 Malignant neoplasm of upper-outer quadrant of unspecified female breast: Secondary | ICD-10-CM

## 2013-05-01 DIAGNOSIS — Z5111 Encounter for antineoplastic chemotherapy: Secondary | ICD-10-CM

## 2013-05-01 DIAGNOSIS — C773 Secondary and unspecified malignant neoplasm of axilla and upper limb lymph nodes: Secondary | ICD-10-CM

## 2013-05-01 DIAGNOSIS — I890 Lymphedema, not elsewhere classified: Secondary | ICD-10-CM

## 2013-05-01 DIAGNOSIS — Z171 Estrogen receptor negative status [ER-]: Secondary | ICD-10-CM

## 2013-05-01 DIAGNOSIS — C50911 Malignant neoplasm of unspecified site of right female breast: Secondary | ICD-10-CM

## 2013-05-01 DIAGNOSIS — Z5112 Encounter for antineoplastic immunotherapy: Secondary | ICD-10-CM

## 2013-05-01 LAB — CBC WITH DIFFERENTIAL/PLATELET
BASO%: 0.4 % (ref 0.0–2.0)
Basophils Absolute: 0.1 10*3/uL (ref 0.0–0.1)
EOS%: 0 % (ref 0.0–7.0)
HGB: 10.7 g/dL — ABNORMAL LOW (ref 11.6–15.9)
MCH: 32.7 pg (ref 25.1–34.0)
MCHC: 33.9 g/dL (ref 31.5–36.0)
MCV: 96.6 fL (ref 79.5–101.0)
MONO%: 4.8 % (ref 0.0–14.0)
RBC: 3.26 10*6/uL — ABNORMAL LOW (ref 3.70–5.45)
RDW: 17.1 % — ABNORMAL HIGH (ref 11.2–14.5)
lymph#: 1.2 10*3/uL (ref 0.9–3.3)

## 2013-05-01 LAB — COMPREHENSIVE METABOLIC PANEL (CC13)
ALT: 14 U/L (ref 0–55)
AST: 16 U/L (ref 5–34)
Albumin: 2.9 g/dL — ABNORMAL LOW (ref 3.5–5.0)
Alkaline Phosphatase: 74 U/L (ref 40–150)
Calcium: 8.6 mg/dL (ref 8.4–10.4)
Chloride: 103 mEq/L (ref 98–109)
Potassium: 3.9 mEq/L (ref 3.5–5.1)
Sodium: 135 mEq/L — ABNORMAL LOW (ref 136–145)
Total Protein: 5.9 g/dL — ABNORMAL LOW (ref 6.4–8.3)

## 2013-05-01 MED ORDER — DIPHENHYDRAMINE HCL 25 MG PO CAPS
ORAL_CAPSULE | ORAL | Status: AC
  Filled 2013-05-01: qty 2

## 2013-05-01 MED ORDER — ONDANSETRON 16 MG/50ML IVPB (CHCC)
16.0000 mg | Freq: Once | INTRAVENOUS | Status: AC
  Administered 2013-05-01: 16 mg via INTRAVENOUS

## 2013-05-01 MED ORDER — ONDANSETRON 16 MG/50ML IVPB (CHCC)
INTRAVENOUS | Status: AC
  Filled 2013-05-01: qty 16

## 2013-05-01 MED ORDER — SODIUM CHLORIDE 0.9 % IJ SOLN
10.0000 mL | INTRAMUSCULAR | Status: DC | PRN
  Administered 2013-05-01: 10 mL
  Filled 2013-05-01: qty 10

## 2013-05-01 MED ORDER — PERTUZUMAB CHEMO INJECTION 420 MG/14ML
420.0000 mg | Freq: Once | INTRAVENOUS | Status: AC
  Administered 2013-05-01: 420 mg via INTRAVENOUS
  Filled 2013-05-01: qty 14

## 2013-05-01 MED ORDER — SODIUM CHLORIDE 0.9 % IV SOLN
Freq: Once | INTRAVENOUS | Status: AC
  Administered 2013-05-01: 13:00:00 via INTRAVENOUS

## 2013-05-01 MED ORDER — LORAZEPAM 2 MG/ML IJ SOLN
INTRAMUSCULAR | Status: AC
  Filled 2013-05-01: qty 1

## 2013-05-01 MED ORDER — DEXAMETHASONE SODIUM PHOSPHATE 20 MG/5ML IJ SOLN
20.0000 mg | Freq: Once | INTRAMUSCULAR | Status: AC
  Administered 2013-05-01: 20 mg via INTRAVENOUS

## 2013-05-01 MED ORDER — DIPHENHYDRAMINE HCL 25 MG PO CAPS
50.0000 mg | ORAL_CAPSULE | Freq: Once | ORAL | Status: AC
  Administered 2013-05-01: 50 mg via ORAL

## 2013-05-01 MED ORDER — HEPARIN SOD (PORK) LOCK FLUSH 100 UNIT/ML IV SOLN
500.0000 [IU] | Freq: Once | INTRAVENOUS | Status: AC | PRN
  Administered 2013-05-01: 500 [IU]
  Filled 2013-05-01: qty 5

## 2013-05-01 MED ORDER — ACETAMINOPHEN 325 MG PO TABS
ORAL_TABLET | ORAL | Status: AC
  Filled 2013-05-01: qty 2

## 2013-05-01 MED ORDER — DEXAMETHASONE SODIUM PHOSPHATE 20 MG/5ML IJ SOLN
INTRAMUSCULAR | Status: AC
  Filled 2013-05-01: qty 5

## 2013-05-01 MED ORDER — LORAZEPAM 2 MG/ML IJ SOLN
0.5000 mg | Freq: Once | INTRAMUSCULAR | Status: AC
  Administered 2013-05-01: 0.5 mg via INTRAVENOUS

## 2013-05-01 MED ORDER — ACETAMINOPHEN 325 MG PO TABS
650.0000 mg | ORAL_TABLET | Freq: Once | ORAL | Status: AC
  Administered 2013-05-01: 650 mg via ORAL

## 2013-05-01 MED ORDER — TRASTUZUMAB CHEMO INJECTION 440 MG
6.0000 mg/kg | Freq: Once | INTRAVENOUS | Status: AC
  Administered 2013-05-01: 399 mg via INTRAVENOUS
  Filled 2013-05-01: qty 19

## 2013-05-01 MED ORDER — SODIUM CHLORIDE 0.9 % IV SOLN
700.0000 mg | Freq: Once | INTRAVENOUS | Status: AC
  Administered 2013-05-01: 700 mg via INTRAVENOUS
  Filled 2013-05-01: qty 70

## 2013-05-01 MED ORDER — DOCETAXEL CHEMO INJECTION 160 MG/16ML
75.0000 mg/m2 | Freq: Once | INTRAVENOUS | Status: AC
  Administered 2013-05-01: 130 mg via INTRAVENOUS
  Filled 2013-05-01: qty 13

## 2013-05-01 NOTE — Patient Instructions (Addendum)
[facility] Discharge Instructions for Patients Receiving Chemotherapy  Today you received the following chemotherapy agents Herceptin, Perjeta, Taxotere, Carboplatin.  To help prevent nausea and vomiting after your treatment, we encourage you to take your nausea medication as prescribed.   If you develop nausea and vomiting that is not controlled by your nausea medication, call the clinic.   BELOW ARE SYMPTOMS THAT SHOULD BE REPORTED IMMEDIATELY:  *FEVER GREATER THAN 100.5 F  *CHILLS WITH OR WITHOUT FEVER  NAUSEA AND VOMITING THAT IS NOT CONTROLLED WITH YOUR NAUSEA MEDICATION  *UNUSUAL SHORTNESS OF BREATH  *UNUSUAL BRUISING OR BLEEDING  TENDERNESS IN MOUTH AND THROAT WITH OR WITHOUT PRESENCE OF ULCERS  *URINARY PROBLEMS  *BOWEL PROBLEMS  UNUSUAL RASH Items with * indicate a potential emergency and should be followed up as soon as possible.  Feel free to call the clinic you have any questions or concerns. The clinic phone number is [phone].    

## 2013-05-01 NOTE — Patient Instructions (Addendum)
Proceed with chemotherapy   I will see you back in 1 week 

## 2013-05-02 ENCOUNTER — Ambulatory Visit (HOSPITAL_BASED_OUTPATIENT_CLINIC_OR_DEPARTMENT_OTHER): Payer: BC Managed Care – PPO

## 2013-05-02 VITALS — BP 134/73 | HR 98 | Temp 98.0°F

## 2013-05-02 DIAGNOSIS — Z5189 Encounter for other specified aftercare: Secondary | ICD-10-CM

## 2013-05-02 DIAGNOSIS — C50419 Malignant neoplasm of upper-outer quadrant of unspecified female breast: Secondary | ICD-10-CM

## 2013-05-02 DIAGNOSIS — C50911 Malignant neoplasm of unspecified site of right female breast: Secondary | ICD-10-CM

## 2013-05-02 MED ORDER — PEGFILGRASTIM INJECTION 6 MG/0.6ML
6.0000 mg | Freq: Once | SUBCUTANEOUS | Status: AC
  Administered 2013-05-02: 6 mg via SUBCUTANEOUS

## 2013-05-03 NOTE — Progress Notes (Signed)
OFFICE PROGRESS NOTE  CC  [name], MD [address] [place] [address] Dr. Lurline Hare  Dr. 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 At that time she had mammogram performed and it was negative. However her breast continue to increase in size. Initially patient felt that it could have been cysts or something on and therefore she did not go to a physician. However subsequently patient's breast became quite painful and she also began to feel in knot under her right arm.because of this she went to her physician who did a physical exam and a mammogram was performed on 01/01/2013 the mammogram showed diffuse skin thickening generalized increased density within the right breast and enlarged right axillary lymph nodes. She went on to have an ultrasound of the right breast performed that showed a large spiculated hypoechoic mass in the right breast 11:00 2 cm from the nipple measuring at least 4.5 cm in size. There were abnormal lymph nodes with thickened cortex and the right axilla the largest one measuring 3.1 cm in dimension.  #2Patient was recommended ultrasound guided core biopsy of the right breast mass and the right axilla lymph node. The biopsy was performed on 01/01/2013. The right needle core biopsy of the primary mass showed invasive mammary carcinoma grade 2-3 was invasive ductal. The lymph node of the right axilla was positive for metastatic disease. The tumor was ER PR negative HER-2/neu positive with a Ki-67 of 76%. On 01/09/2013 patient had MRI of the breasts performed that showed diffuse right breast skin thickening and edema with right axillary lymph node. There was a 5 mm internal mammary lymph node that  was incompletely imaged. The right breast mass measured 8 x 5 x 4.7 cm. There were abnormal areas involving all 4 quadrants. There was however no involvement of the pectoralis minor for lower chest wall. The left breast showed 2 areas of nodular enhancement these were biopsied and were fibroadenomas  #3 it was recommended that patient begin neoadjuvant chemotherapy consisting of Taxotere carboplatinum Herceptin and perjeta. This would be given every 3 weeks with day 2 Neulasta. A total of 6 cycles of therapy were planned. Once she completes the treatment then she would proceed with MRI of the breasts again for evaluation a response to therapy and eventually a mastectomy.  #4 patient began Neoadjuvant with curative intentTaxotere carboplatinum Herceptin and perjeta starting 01/16/2013 - 05/01/2013.  CURRENT THERAPY:  Neoadjuvant cycle 6 TCH/perjeta with day 2 Neulasta   INTERVAL HISTORY: Jasmine Schwartz 47 y.o. female returns for followup visit today overall she is doing well she is without any complaints. Overall she's doing well she is tolerating her chemotherapy very nicely without any problems. Her blood work looks Community education officer. She denies any nausea vomiting fevers chills night sweats headaches shortness of breath chest pains or palpitations no peripheral paresthesias. No myalgias and arthralgias. Remainder of the 10 point review of systems is negative.  MEDICAL HISTORY: Past Medical History  Diagnosis Date  . Anxiety   . Depression   . Dyslipidemia   . Anxiety and depression 07/06/2011  . Breast cancer 01/01/13    /metastatic 1/1 lymph nodes  . Diabetes mellitus     IDDM  . Hx of insertion  of insulin pump     ALLERGIES:  has No Known Allergies.  MEDICATIONS:  Current Outpatient Prescriptions  Medication Sig Dispense Refill  . ALPRAZolam (XANAX) 0.25 MG tablet Take 0.25 mg by mouth 3 (three) times daily as needed for sleep or anxiety.      . Alum & Mag Hydroxide-Simeth (MAGIC  MOUTHWASH W/LIDOCAINE) SOLN Take 5 mLs by mouth 4 (four) times daily as needed. Swish and spit.  120 mL  1  . dexamethasone (DECADRON) 4 MG tablet Take 2 tablets (8 mg total) by mouth 2 (two) times daily with a meal. Take two times a day the day before Taxotere. Then take two times a day starting the day after chemo for 3 days.  40 tablet  3  . fluconazole (DIFLUCAN) 200 MG tablet Take 1 tablet (200 mg total) by mouth daily.  5 tablet  5  . gabapentin (NEURONTIN) 100 MG capsule Take 1 capsule (100 mg total) by mouth 2 (two) times daily.  60 capsule  6  . glucose blood (NOVA MAX TEST) test strip Use as directed up to 8 times a day      . Insulin Infusion Pump (MINIMED INSULIN PUMP) DEVI Inject 1 Units/hr into the vein continuous. Pt give extra per cards at meal times. Basic meals are about 5 units      . Insulin Infusion Pump Supplies (MINIMED INFUSION SET-MMT 397) MISC 1 Device by Does not apply route every 3 (three) days.      . Insulin Infusion Pump Supplies (PARADIGM PUMP RESERVOIR 1.76ML) MISC 1 Device by Does not apply route every 3 (three) days.      . insulin lispro (HUMALOG) 100 UNIT/ML injection Inject as directed into Insulin pump, total of 40 units/day  20 mL  2  . lidocaine-prilocaine (EMLA) cream Apply topically as needed. Apply to port-a-cath site 1-2 hours before treatment.  30 g  3  . LORazepam (ATIVAN) 0.5 MG tablet Take 1 tablet (0.5 mg total) by mouth every 6 (six) hours as needed (Nausea or vomiting).  30 tablet  2  . ondansetron (ZOFRAN) 8 MG tablet Take 8 mg by mouth 2 (two) times daily. Take two times a day starting the day after chemo for 3 days. Then take two times a day as needed for nausea or vomiting.      . prochlorperazine (COMPAZINE) 10 MG tablet Take 1 tablet (10 mg total) by mouth every 6 (six) hours as needed (Nausea or vomiting).  30 tablet  1  . prochlorperazine (COMPAZINE) 25 MG suppository Place 25 mg rectally every 12 (twelve) hours as needed for nausea.      .  sucralfate (CARAFATE) 1 GM/10ML suspension Take 10 mLs (1 g total) by mouth 4 (four) times daily.  420 mL  6  . valACYclovir (VALTREX) 500 MG tablet Take 1 tablet (500 mg total) by mouth 2 (two) times daily.  30 tablet  4   No current facility-administered medications for this visit.    SURGICAL HISTORY:  Past Surgical History  Procedure Laterality Date  . Lumbar spine surgery  2009    Dr Jillyn Hidden  . Cesarean section  1989  . Tubal ligation  1991  . Breast biopsy Right 01/01/13    Invasive mammary ca,metastatic in 1/1 lymph node  . Breast biopsy Left 01/09/13    Fibroadenoma,microcalcifications    REVIEW OF SYSTEMS:  Pertinent items are noted in HPI.   HEALTH MAINTENANCE:  PHYSICAL EXAMINATION: Blood pressure 125/78, pulse 108,  temperature 97.6 F (36.4 C), temperature source Oral, resp. rate 20, height 5\' 4"  (1.626 m), weight 150 lb 8 oz (68.266 kg). Body mass index is 25.82 kg/(m^2). ECOG PERFORMANCE STATUS: 0 - Asymptomatic   General appearance: alert, cooperative and appears stated age Resp: clear to auscultation bilaterally Cardio: regular rate and rhythm GI: soft, non-tender; bowel sounds normal; no masses,  no organomegaly Extremities: extremities normal, atraumatic, no cyanosis or edema Neurologic: Grossly normal Right breast reveals a large palpable mass without any nipple discharge it is softer Left breast no masses or nipple discharge  LABORATORY DATA: Lab Results  Component Value Date   WBC 21.9* 05/01/2013   HGB 10.7* 05/01/2013   HCT 31.5* 05/01/2013   MCV 96.6 05/01/2013   PLT 296 05/01/2013      Chemistry      Component Value Date/Time   NA 135* 05/01/2013 1105   NA 140 03/31/2012 0934   K 3.9 05/01/2013 1105   K 4.7 03/31/2012 0934   CL 99 01/23/2013 1251   CL 106 03/31/2012 0934   CO2 20* 05/01/2013 1105   CO2 27 03/31/2012 0934   BUN 17.7 05/01/2013 1105   BUN 13 03/31/2012 0934   CREATININE 0.9 05/01/2013 1105   CREATININE 0.6 03/31/2012 0934      Component  Value Date/Time   CALCIUM 8.6 05/01/2013 1105   CALCIUM 8.9 03/31/2012 0934   ALKPHOS 74 05/01/2013 1105   ALKPHOS 42 03/31/2012 0934   AST 16 05/01/2013 1105   AST 21 03/31/2012 0934   ALT 14 05/01/2013 1105   ALT 15 03/31/2012 0934   BILITOT 0.65 05/01/2013 1105   BILITOT 0.7 03/31/2012 0934       RADIOGRAPHIC STUDIES:  Ct Chest W Contrast  01/20/2013   *RADIOLOGY REPORT*  Clinical Data:  High risk breast cancer.  Evaluate for metastatic disease.  CT CHEST, ABDOMEN AND PELVIS WITH CONTRAST  Technique:  Multidetector CT imaging of the chest, abdomen and pelvis was performed following the standard protocol during bolus administration of intravenous contrast.  Contrast: OMNIPAQUE IOHEXOL 300 MG/ML  SOLN  Comparison:  None  CT CHEST  Findings:  Lungs/pleura: There is no pleural effusion.  No suspicious pulmonary nodule or mass noted.  Heart/Mediastinum: Heart size is normal.  No pericardial effusion. No mediastinal or hilar adenopathy.  Bones/Musculoskeletal:  Asymmetric increased soft tissue within the right breast parenchyma corresponding to patient's biopsy-proven invasive breast carcinoma.  There is associated overlying skin thickening.  Multiple enlarged right axillary lymph nodes are identified.  Index lymph node measures 1.7 cm, image 23/series 2. Several enhancing, sub centimeter retropectoral lymph nodes are identified.  IMPRESSION:  1.  No acute findings. 2.  Right breast invasive carcinoma with associated right axillary adenopathy.  CT ABDOMEN AND PELVIS  Findings:  There are no focal liver abnormalities identified.  The gallbladder appears normal.  There is no biliary dilatation.  The pancreas is unremarkable.  Normal appearance of the spleen.  The adrenal glands are both normal.  The kidneys are unremarkable. Urinary bladder appears normal.  The uterus is unremarkable. Corpus luteal cyst is noted in the left ovary measuring 1.6 cm.  No free fluid or fluid collections identified within the  upper abdomen or pelvis.  The abdominal aorta has a normal caliber. There is no adenopathy identified within the upper abdomen.  No pelvic or inguinal adenopathy noted.  The stomach is normal.  The small bowel loops are unremarkable. Normal appearance of the colon.  Review  of the visualized osseous structures is negative for aggressive lytic or sclerotic bone lesion.  IMPRESSION:  1.  No acute findings within the abdomen or pelvis. 2.  No mass or adenopathy.   Original Report Authenticated By: Signa Kell, M.D.   US Breast Left  01/09/2013   *RADIOLOGY REPORT*  Clinical Data:  47 year old female with newly diagnosed right breast cancer.  Left mammogram to compare to presurgical MRI.  DIGITAL DIAGNOSTIC LEFT MAMMOGRAM WITH CAD AND LEFT BREAST ULTRASOUND:  Comparison:  06/11/2012 mammogram and 01/09/2013 MRI  Findings:  ACR Breast Density Category 3: The breast tissue is heterogeneously dense.  A possible circumscribed mass in the upper outer left breast is identified. No suspicious calcifications or distortion identified.  Mammographic images were processed with CAD.  On physical exam, no palpable abnormalities identified within the upper left breast.  Ultrasound is performed, showing a 6 x 6 x 8 mm slightly irregular hypoechoic mass at the 11 o'clock position of the left breast 5 cm from the nipple An 11 x 7 x 9 mm slightly irregular hypoechoic mass at the 2 o'clock position of the left breast 4 cm from the nipple is also present. No enlarged or abnormal appearing left axillary lymph nodes are identified.  IMPRESSION: Indeterminate 6 x 6 x 8 mm hypoechoic mass at the 11 o'clock position of the left breast and 11 x 7 x 9 mm hypoechoic mass in the 2 o'clock position of the left breast.  The 11 o'clock position mass probably represents the enhancing nodule identified on today's MRI.  Tissue sampling of both masses is recommended.  BI-RADS CATEGORY 4:  Suspicious abnormality - biopsy should be considered.   RECOMMENDATION: Ultrasound guided biopsies of both left breast masses.  The findings and recommendations were discussed with the patient and she desires to proceed with ultrasound guided left breast biopsies. These biopsies will be performed today but dictated in a separate report.  Results were also provided in writing at the conclusion of the visit.  If applicable, a reminder letter will be sent to the patient regarding the next appointment.   Original Report Authenticated By: Harmon Pier, M.D.   Ct Abdomen Pelvis W Contrast  01/22/2013   *RADIOLOGY REPORT*  Clinical Data:  High risk breast cancer.  Evaluate for metastatic disease.  CT CHEST, ABDOMEN AND PELVIS WITH CONTRAST  Technique:  Multidetector CT imaging of the chest, abdomen and pelvis was performed following the standard protocol during bolus administration of intravenous contrast.  Contrast: OMNIPAQUE IOHEXOL 300 MG/ML  SOLN  Comparison:  None  CT CHEST  Findings:  Lungs/pleura: There is no pleural effusion.  No suspicious pulmonary nodule or mass noted.  Heart/Mediastinum: Heart size is normal.  No pericardial effusion. No mediastinal or hilar adenopathy.  Bones/Musculoskeletal:  Asymmetric increased soft tissue within the right breast parenchyma corresponding to patient's biopsy-proven invasive breast carcinoma.  There is associated overlying skin thickening.  Multiple enlarged right axillary lymph nodes are identified.  Index lymph node measures 1.7 cm, image 23/series 2. Several enhancing, sub centimeter retropectoral lymph nodes are identified.  IMPRESSION:  1.  No acute findings. 2.  Right breast invasive carcinoma with associated right axillary adenopathy.  CT ABDOMEN AND PELVIS  Findings:  There are no focal liver abnormalities identified.  The gallbladder appears normal.  There is no biliary dilatation.  The pancreas is unremarkable.  Normal appearance of the spleen.  The adrenal glands are both normal.  The kidneys are unremarkable.  Urinary bladder  appears normal.  The uterus is unremarkable. Corpus luteal cyst is noted in the left ovary measuring 1.6 cm.  No free fluid or fluid collections identified within the upper abdomen or pelvis.  The abdominal aorta has a normal caliber. There is no adenopathy identified within the upper abdomen.  No pelvic or inguinal adenopathy noted.  The stomach is normal.  The small bowel loops are unremarkable. Normal appearance of the colon.  Review of the visualized osseous structures is negative for aggressive lytic or sclerotic bone lesion.  IMPRESSION:  1.  No acute findings within the abdomen or pelvis. 2.  No mass or adenopathy.   Original Report Authenticated By: Signa Kell, M.D.   Mr Breast Bilateral W Wo Contrast  01/09/2013   *RADIOLOGY REPORT*  Clinical Data: Biopsy-proven right breast inflammatory carcinoma with primary invasive mammary carcinoma and previous biopsy and known right axillary metastatic disease.  Preoperative staging MRI.  BUN and creatinine were obtained on site at [facility] at [address] Results:  BUN 10 mg/dL,  Creatinine 0.9 mg/dL.  BILATERAL BREAST MRI WITH AND WITHOUT CONTRAST  Technique: Multiplanar, multisequence MR images of both breasts were obtained prior to and following the intravenous administration of 13ml of Multihance.  Three dimensional images were evaluated at the independent DynaCad workstation.  Comparison:  Prior mammograms and ultrasound.  The patient underwent diagnostic mammography and ultrasound of the left breast today as no recent prior exam was available after the recent diagnosis in the right breast.  Findings: There is diffuse right breast skin thickening and edema, trabecular edema, and right axillary lymphadenopathy with maximal nodal measurement 3.1 cm short-axis diameter.  There is also a 5 mm internal mammary artery chain lymph node identified image 26 series 2. There is an incompletely visualized lymph node subjacent to the  pectoralis minor muscle on the first image of the T2-weighted series.  There is a large irregular enhancing mass in the right breast 11 o'clock location with associated clip artifact measuring maximally 8.0 x 5.6 by 4.7 cm. This demonstrates washin/washout type enhancement kinetics.  This corresponds to the biopsy-proven invasive mammary carcinoma.  There are abnormal nodular areas of trabecular has been throughout the right breast involving all four quadrants. No abnormal underlying pectoralis enhancement or chest wall involvement is identified.  In the left breast 11 o'clock location, there is an 8 mm enhancing nodule corresponding to that seen at the examination earlier today. This area demonstrates plateau type enhancement kinetics.  No left- sided axillary or internal mammary artery chain lymphadenopathy is identified.  There is mild inhomogeneous nodular enhancement in the left breast two o'clock location at the site of the mass seen in this location earlier today, measuring 9 mm, measuring progressive enhancement kinetics.  IMPRESSION: Large irregular enhancing right breast mass 11 o'clock location corresponding to biopsy-proven invasive mammary carcinoma, with metastatic right axillary lymphadenopathy as well as skin and trabecular thickening/enhancement compatible with the clinical diagnosis of inflammatory and/or locally advanced breast cancer.  5 mm abnormal right internal mammary artery chain lymph node.  8 mm enhancing mass left breast 11 o'clock location and mildly irregular area of mass-like enhancement in the left breast two o'clock location, corresponding to those seen at diagnostic examination earlier today.  Biopsy has been scheduled for today and will be dictated separately.  RECOMMENDATION: Treatment plan  THREE-DIMENSIONAL MR IMAGE RENDERING ON INDEPENDENT WORKSTATION:  Three-dimensional MR images were rendered by post-processing of the original MR data on an independent workstation.  The  three- dimensional MR images were interpreted, and findings were reported in the accompanying complete MRI report for this study.  BI-RADS CATEGORY 6:  Known biopsy-proven malignancy - appropriate action should be taken.   Original Report Authenticated By: Christiana Pellant, M.D.   Nm Pet Image Initial (pi) Skull Base To Thigh  01/20/2013   *RADIOLOGY REPORT*  Clinical Data: Initial treatment strategy for breast cancer.  NUCLEAR MEDICINE PET SKULL BASE TO THIGH  Fasting Blood Glucose:  263  Technique:  19.1 mCi F-18 FDG was injected intravenously. CT data was obtained and used for attenuation correction and anatomic localization only.  (This was not acquired as a diagnostic CT examination.) Additional exam technical data entered on technologist worksheet.  Comparison:  None  Findings:  Neck: No hypermetabolic lymph nodes in the neck.  Chest:  No hypermetabolic mediastinal or hilar nodes.  No suspicious pulmonary nodules on the CT scan. Enlarged right axillary lymph nodes identified.  Index lymph node measures 1.7 cm and has an SUV max equal to 2.8, image 77.  There is asymmetric increased uptake throughout the right breast with associated overlying skin thickening.  This has an SUV max equal to 4.4, image 101.  Abdomen/Pelvis:  No abnormal hypermetabolic activity within the liver, pancreas, adrenal glands, or spleen.  No hypermetabolic lymph nodes in the abdomen or pelvis. There is a small focus of increased FDG uptake within the left ovary.  This has an SUV max equal to 13.1, image 217.  Skeleton:  No focal hypermetabolic activity to suggest skeletal metastasis.  IMPRESSION:  1.  Asymmetric increased uptake associated with the right breast and right breast skin thickening corresponding to biopsy-proven invasive breast cancer. 2.  Enlarged and hypermetabolic right axillary lymph nodes consistent with metastatic adenopathy. 3.  No evidence for distant metastatic disease. 4.  Focal area of increased uptake within the  left axilla.  The patient is noted to have a corpus luteal cyst are not diagnostic CT images.  This activity is favored to represent physiologic activity.  Consider confirmatory imaging with pelvic sonogram.   Original Report Authenticated By: Signa Kell, M.D.   Ir Fluoro Guide Cv Line Left  01/15/2013   *RADIOLOGY REPORT*  Clinical Data/Indication: Breast cancer  TUNNEL POWER PORT PLACEMENT WITH SUBCUTANEOUS POCKET UTILIZING ULTRASOUND & FLOUROSCOPY  Sedation: Versed 5.0 mg, Fentanyl 250 mcg.  Total Moderate Sedation Time: 35 minutes.  As antibiotic prophylaxis, Ancef  was ordered pre-procedure and administered intravenously within one hour of incision.  Fluoroscopy Time: 36 seconds.  Procedure:  After written informed consent was obtained, patient was placed in the supine position on angiographic table. The left neck and chest was prepped and draped in a sterile fashion. Lidocaine was utilized for local anesthesia.  The left internal jugular vein was noted to be patent initially with ultrasound. Under sonographic guidance, a micropuncture needle was inserted into the left IJ vein (Ultrasound and fluoroscopic image documentation was performed). The needle was removed over an 018 wire which was exchanged for a Amplatz.  This was advanced into the IVC.  An 8-French dilator was advanced over the Amplatz.  A small incision was made in the left upper chest over the anterior left second rib.  Utilizing blunt dissection, a subcutaneous pocket was created in the caudal direction. The pocket was irrigated with a copious amount of sterile normal saline.  The port catheter was tunneled from the chest incision, and out the neck incision.  The reservoir was inserted into the subcutaneous pocket and  secured with two 3-0 Ethilon stitches.  A peel-away sheath was advanced over the Amplatz wire.  The port catheter was cut to measure length and inserted through the peel-away sheath.  The peel-away sheath was removed.  The chest  incision was closed with 3-0 Vicryl interrupted stitches for the subcutaneous tissue and a running of 4- 0 Vicryl subcuticular stitch for the skin.  The neck incision was closed with a 4-0 Vicryl subcuticular stitch.  Derma-bond was applied to both surgical incisions.  The port reservoir was flushed and instilled with heparinized saline.  No complications.  Findings:  A left IJ vein Port-A-Cath is in place with its tip at the cavoatrial junction.  IMPRESSION: Successful 8 French left internal jugular vein power port placement with its tip at the SVC/RA junction.   Original Report Authenticated By: Jolaine Click, M.D.   Ir US Guide Vasc Access Left  01/15/2013   *RADIOLOGY REPORT*  Clinical Data/Indication: Breast cancer  TUNNEL POWER PORT PLACEMENT WITH SUBCUTANEOUS POCKET UTILIZING ULTRASOUND & FLOUROSCOPY  Sedation: Versed 5.0 mg, Fentanyl 250 mcg.  Total Moderate Sedation Time: 35 minutes.  As antibiotic prophylaxis, Ancef  was ordered pre-procedure and administered intravenously within one hour of incision.  Fluoroscopy Time: 36 seconds.  Procedure:  After written informed consent was obtained, patient was placed in the supine position on angiographic table. The left neck and chest was prepped and draped in a sterile fashion. Lidocaine was utilized for local anesthesia.  The left internal jugular vein was noted to be patent initially with ultrasound. Under sonographic guidance, a micropuncture needle was inserted into the left IJ vein (Ultrasound and fluoroscopic image documentation was performed). The needle was removed over an 018 wire which was exchanged for a Amplatz.  This was advanced into the IVC.  An 8-French dilator was advanced over the Amplatz.  A small incision was made in the left upper chest over the anterior left second rib.  Utilizing blunt dissection, a subcutaneous pocket was created in the caudal direction. The pocket was irrigated with a copious amount of sterile normal saline.  The port  catheter was tunneled from the chest incision, and out the neck incision.  The reservoir was inserted into the subcutaneous pocket and secured with two 3-0 Ethilon stitches.  A peel-away sheath was advanced over the Amplatz wire.  The port catheter was cut to measure length and inserted through the peel-away sheath.  The peel-away sheath was removed.  The chest incision was closed with 3-0 Vicryl interrupted stitches for the subcutaneous tissue and a running of 4- 0 Vicryl subcuticular stitch for the skin.  The neck incision was closed with a 4-0 Vicryl subcuticular stitch.  Derma-bond was applied to both surgical incisions.  The port reservoir was flushed and instilled with heparinized saline.  No complications.  Findings:  A left IJ vein Port-A-Cath is in place with its tip at the cavoatrial junction.  IMPRESSION: Successful 8 French left internal jugular vein power port placement with its tip at the SVC/RA junction.   Original Report Authenticated By: Jolaine Click, M.D.   Mm Digital Diagnostic Unilat L  01/09/2013   *RADIOLOGY REPORT*  Clinical Data:  Evaluate clip placement following two separate ultrasound-guided left breast biopsies.  DIGITAL DIAGNOSTIC LEFT MAMMOGRAM  Comparison:  Previous exams  Findings:  Films are performed following two separate ultrasound guided biopsies of the left breast - at the 11 o'clock position and 2 o'clock position. The ribbon shaped biopsy clip corresponds to the 11 o'clock  mass and the T shaped biopsy clip corresponds to the 2 o'clock mass. No immediate complications are identified.  IMPRESSION: Satisfactory clip placement following ultrasound guided left breast biopsies.  Pathology will be followed.   Original Report Authenticated By: Harmon Pier, M.D.   Mm Digital Diagnostic Unilat L  01/09/2013   *RADIOLOGY REPORT*  Clinical Data:  47 year old female with newly diagnosed right breast cancer.  Left mammogram to compare to presurgical MRI.  DIGITAL DIAGNOSTIC LEFT  MAMMOGRAM WITH CAD AND LEFT BREAST ULTRASOUND:  Comparison:  06/11/2012 mammogram and 01/09/2013 MRI  Findings:  ACR Breast Density Category 3: The breast tissue is heterogeneously dense.  A possible circumscribed mass in the upper outer left breast is identified. No suspicious calcifications or distortion identified.  Mammographic images were processed with CAD.  On physical exam, no palpable abnormalities identified within the upper left breast.  Ultrasound is performed, showing a 6 x 6 x 8 mm slightly irregular hypoechoic mass at the 11 o'clock position of the left breast 5 cm from the nipple An 11 x 7 x 9 mm slightly irregular hypoechoic mass at the 2 o'clock position of the left breast 4 cm from the nipple is also present. No enlarged or abnormal appearing left axillary lymph nodes are identified.  IMPRESSION: Indeterminate 6 x 6 x 8 mm hypoechoic mass at the 11 o'clock position of the left breast and 11 x 7 x 9 mm hypoechoic mass in the 2 o'clock position of the left breast.  The 11 o'clock position mass probably represents the enhancing nodule identified on today's MRI.  Tissue sampling of both masses is recommended.  BI-RADS CATEGORY 4:  Suspicious abnormality - biopsy should be considered.  RECOMMENDATION: Ultrasound guided biopsies of both left breast masses.  The findings and recommendations were discussed with the patient and she desires to proceed with ultrasound guided left breast biopsies. These biopsies will be performed today but dictated in a separate report.  Results were also provided in writing at the conclusion of the visit.  If applicable, a reminder letter will be sent to the patient regarding the next appointment.   Original Report Authenticated By: Harmon Pier, M.D.   [place] Lt Breast Bx W Loc Dev 1st Lesion Img Bx Spec US Guide  01/12/2013   *RADIOLOGY REPORT*  Clinical Data:  47 year old female with newly diagnosed right breast cancer.  Two left breast masses identified sonographically - 6 x  8 mm at the 11 o'clock position and 9 x 11 mm at the 2 o'clock position. For tissue sampling of the 6 x 8 mm mass at the 11 o'clock position.  ULTRASOUND GUIDED VACUUM ASSISTED CORE BIOPSY OF THE LEFT BREAST  Comparison: Previous exams.  I met with the patient and we discussed the procedure of ultrasound- guided biopsy, including benefits and alternatives.  We discussed the high likelihood of a successful procedure. We discussed the risks of the procedure including infection, bleeding, tissue injury, clip migration, and inadequate sampling.  Informed written consent was given.  Using sterile technique and 2% Lidocaine as local anesthetic, under direct ultrasound visualization, a12 gauge vacuum-assisteddevice was used to perform biopsy of the 6 x 8 mm hypoechoic mass at the 11 o'clock position of the left breast 5 cm from the nipple using a medial approach. At the conclusion of the procedure, a ribbon shaped tissue marker clip was deployed into the biopsy cavity. Follow-up 2-view mammogram was performed and dictated separately.  The usual time-out protocol was performed immediately prior to  the procedure.  IMPRESSION: Ultrasound-guided biopsy of 6 x 8 mm hypoechoic mass at the 11 o'clock position of the left breast.  No apparent complications.  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  Histology correlates with imaging findings.  The patient was contacted by phone on 01/12/2013 and these results given to her which she understood. Her questions were answered. The patient had no complaints with her biopsy site.  Recommend treatment plan.   Original Report Authenticated By: Harmon Pier, M.D.   [place] Lt Breast Bx W Loc Dev Ea Add Lesion Img Bx Spec US Guide  01/12/2013   *RADIOLOGY REPORT*  Clinical Data:  47 year old female with newly diagnosed right breast cancer.  Two left breast masses identified sonographically - 6 x 8 mm at the 11 o'clock position and 9 x  11 mm at the 2 o'clock position. For tissue sampling of the 9 x 11 mm mass at the 2 o'clock position.  ULTRASOUND GUIDED VACUUM ASSISTED CORE BIOPSY OF THE LEFT BREAST  Comparison: Previous exams.  I met with the patient and we discussed the procedure of ultrasound- guided biopsy, including benefits and alternatives.  We discussed the high likelihood of a successful procedure. We discussed the risks of the procedure including infection, bleeding, tissue injury, clip migration, and inadequate sampling.  Informed written consent was given.  Using sterile technique and 2% Lidocaine as local anesthetic, under direct ultrasound visualization, a 12 gauge vacuum-assisteddevice was used to perform biopsy of the 9 x 11 mm hypoechoic mass at the 2 o'clock position of the left breast 4 cm from the nipple using a medial approach. At the conclusion of the procedure, a  tissue marker clip was deployed into the biopsy cavity.  Follow-up 2-view mammogram was performed and dictated separately.  The usual time-out protocol was performed immediately prior to the procedure.  IMPRESSION: Ultrasound-guided biopsy of 9 x 11 mm mass in the 2 o'clock position of the left breast.  No apparent complications.  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 Histology correlates with imaging findings.  The patient was contacted by phone on 01/12/2013 and these results given to her which she understood. Her questions were answered. The patient had no complaints with her biopsy site.  Recommend treatment plan.   Original Report Authenticated By: Harmon Pier, M.D.    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 Patient is receiving neoadjuvant chemotherapy and her to therapy. She is receiving double HER-2 therapy with Herceptin and perjeta. She receives  chemotherapy consisting of Taxotere and carboplatinum. All drugs are given every 3 weeks with day 2 Neulasta. She began her treatment on 01/16/2013. Thus far she is tolerating it well.  #2 patient is set up to see the genetic counselor in the next few weeks since she does have  unde breast cancer under the age of 31. We again today discussed the rationale for referring her to genetic counseling. 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  #3 left lower extremity edema: Likely secondary to chemotherapy.  #4 nail bed changes: secondary to chemotherapy patient is reassured   PLAN:   #1 patient will proceed with final cycle but his cycle 6  of TCH/perjeta.  #2 I will see her back in one week's time for followup and labs.  #3 she has already been seen by Dr. Claud Kelp and will begin process of getting scheduled for definitive surgery. Patient is planning on having bilateral mastectomies.  All questions were answered. The patient knows to call the clinic with any problems, questions or concerns. We can certainly see the patient much sooner if necessary.  I spent 25 minutes counseling the patient face to face. The total time spent in the appointment was 30 minutes.    Drue Second, MD Medical/Oncology [facility] [phone] (beeper) [phone] (Office)

## 2013-05-03 NOTE — Progress Notes (Signed)
OFFICE PROGRESS NOTE  CC  [name], MD [address] [place] [address] Dr. Lurline Hare  Dr. 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 At that time she had mammogram performed and it was negative. However her breast continue to increase in size. Initially patient felt that it could have been cysts or something on and therefore she did not go to a physician. However subsequently patient's breast became quite painful and she also began to feel in knot under her right arm.because of this she went to her physician who did a physical exam and a mammogram was performed on 01/01/2013 the mammogram showed diffuse skin thickening generalized increased density within the right breast and enlarged right axillary lymph nodes. She went on to have an ultrasound of the right breast performed that showed a large spiculated hypoechoic mass in the right breast 11:00 2 cm from the nipple measuring at least 4.5 cm in size. There were abnormal lymph nodes with thickened cortex and the right axilla the largest one measuring 3.1 cm in dimension.  #2Patient was recommended ultrasound guided core biopsy of the right breast mass and the right axilla lymph node. The biopsy was performed on 01/01/2013. The right needle core biopsy of the primary mass showed invasive mammary carcinoma grade 2-3 was invasive ductal. The lymph node of the right axilla was positive for metastatic disease. The tumor was ER PR negative HER-2/neu positive with a Ki-67 of 76%. On 01/09/2013 patient had MRI of the breasts performed that showed diffuse right breast skin thickening and edema with right axillary lymph node. There was a 5 mm internal mammary lymph node that  was incompletely imaged. The right breast mass measured 8 x 5 x 4.7 cm. There were abnormal areas involving all 4 quadrants. There was however no involvement of the pectoralis minor for lower chest wall. The left breast showed 2 areas of nodular enhancement these were biopsied and were fibroadenomas  #3 it was recommended that patient begin neoadjuvant chemotherapy consisting of Taxotere carboplatinum Herceptin and perjeta. This would be given every 3 weeks with day 2 Neulasta. A total of 6 cycles of therapy were planned. Once she completes the treatment then she would proceed with MRI of the breasts again for evaluation a response to therapy and eventually a mastectomy.  #4 patient began Neoadjuvant with curative intentTaxotere carboplatinum Herceptin and perjeta starting 01/16/2013.  CURRENT THERAPY: s/p Neoadjuvant cycle 5 TCH/perjeta with day 2 Neulasta   INTERVAL HISTORY: Jasmine Schwartz 47 y.o. female returns for followup visit today overall she is doing well she is without any complaints. Overall she's doing well she is tolerating her chemotherapy very nicely without any problems. Her blood work looks Community education officer. She denies any nausea vomiting fevers chills night sweats headaches shortness of breath chest pains or palpitations no peripheral paresthesias. No myalgias and arthralgias. Remainder of the 10 point review of systems is negative.  MEDICAL HISTORY: Past Medical History  Diagnosis Date  . Anxiety   . Depression   . Dyslipidemia   . Anxiety and depression 07/06/2011  . Breast cancer 01/01/13    /metastatic 1/1 lymph nodes  . Diabetes mellitus     IDDM  . Hx of insertion of insulin  pump     ALLERGIES:  has No Known Allergies.  MEDICATIONS:  Current Outpatient Prescriptions  Medication Sig Dispense Refill  . ALPRAZolam (XANAX) 0.25 MG tablet Take 0.25 mg by mouth 3 (three) times daily as needed for sleep or anxiety.      . Alum & Mag Hydroxide-Simeth (MAGIC MOUTHWASH  W/LIDOCAINE) SOLN Take 5 mLs by mouth 4 (four) times daily as needed. Swish and spit.  120 mL  1  . gabapentin (NEURONTIN) 100 MG capsule Take 1 capsule (100 mg total) by mouth 2 (two) times daily.  60 capsule  6  . glucose blood (NOVA MAX TEST) test strip Use as directed up to 8 times a day      . Insulin Infusion Pump (MINIMED INSULIN PUMP) DEVI Inject 1 Units/hr into the vein continuous. Pt give extra per cards at meal times. Basic meals are about 5 units      . Insulin Infusion Pump Supplies (MINIMED INFUSION SET-MMT 397) MISC 1 Device by Does not apply route every 3 (three) days.      . Insulin Infusion Pump Supplies (PARADIGM PUMP RESERVOIR 1.76ML) MISC 1 Device by Does not apply route every 3 (three) days.      . insulin lispro (HUMALOG) 100 UNIT/ML injection Inject as directed into Insulin pump, total of 40 units/day  20 mL  2  . lidocaine-prilocaine (EMLA) cream Apply topically as needed. Apply to port-a-cath site 1-2 hours before treatment.  30 g  3  . LORazepam (ATIVAN) 0.5 MG tablet Take 1 tablet (0.5 mg total) by mouth every 6 (six) hours as needed (Nausea or vomiting).  30 tablet  2  . ondansetron (ZOFRAN) 8 MG tablet Take 8 mg by mouth 2 (two) times daily. Take two times a day starting the day after chemo for 3 days. Then take two times a day as needed for nausea or vomiting.      . prochlorperazine (COMPAZINE) 10 MG tablet Take 1 tablet (10 mg total) by mouth every 6 (six) hours as needed (Nausea or vomiting).  30 tablet  1  . valACYclovir (VALTREX) 500 MG tablet Take 1 tablet (500 mg total) by mouth 2 (two) times daily.  30 tablet  4  . dexamethasone (DECADRON) 4 MG tablet Take 2 tablets (8 mg total) by mouth 2 (two) times daily with a meal. Take two times a day the day before Taxotere. Then take two times a day starting the day after chemo for 3 days.  40 tablet  3  . fluconazole (DIFLUCAN) 200 MG tablet Take 1 tablet (200 mg total) by mouth daily.  5 tablet  5  . prochlorperazine  (COMPAZINE) 25 MG suppository Place 25 mg rectally every 12 (twelve) hours as needed for nausea.      . sucralfate (CARAFATE) 1 GM/10ML suspension Take 10 mLs (1 g total) by mouth 4 (four) times daily.  420 mL  6   No current facility-administered medications for this visit.    SURGICAL HISTORY:  Past Surgical History  Procedure Laterality Date  . Lumbar spine surgery  2009    Dr Jillyn Hidden  . Cesarean section  1989  . Tubal ligation  1991  . Breast biopsy Right 01/01/13    Invasive mammary ca,metastatic in 1/1 lymph node  . Breast biopsy Left 01/09/13    Fibroadenoma,microcalcifications    REVIEW OF SYSTEMS:  Pertinent items are noted in HPI.   HEALTH MAINTENANCE:  PHYSICAL EXAMINATION: Blood pressure 123/79, pulse 118, temperature 97.8  F (36.6 C), temperature source Oral, resp. rate 18, height 5\' 4"  (1.626 m), weight 137 lb 6.4 oz (62.324 kg). Body mass index is 23.57 kg/(m^2). ECOG PERFORMANCE STATUS: 0 - Asymptomatic   General appearance: alert, cooperative and appears stated age Resp: clear to auscultation bilaterally Cardio: regular rate and rhythm GI: soft, non-tender; bowel sounds normal; no masses,  no organomegaly Extremities: extremities normal, atraumatic, no cyanosis or edema Neurologic: Grossly normal Right breast reveals a large palpable mass without any nipple discharge it is softer Left breast no masses or nipple discharge  LABORATORY DATA: Lab Results  Component Value Date   WBC 21.9* 05/01/2013   HGB 10.7* 05/01/2013   HCT 31.5* 05/01/2013   MCV 96.6 05/01/2013   PLT 296 05/01/2013      Chemistry      Component Value Date/Time   NA 135* 05/01/2013 1105   NA 140 03/31/2012 0934   K 3.9 05/01/2013 1105   K 4.7 03/31/2012 0934   CL 99 01/23/2013 1251   CL 106 03/31/2012 0934   CO2 20* 05/01/2013 1105   CO2 27 03/31/2012 0934   BUN 17.7 05/01/2013 1105   BUN 13 03/31/2012 0934   CREATININE 0.9 05/01/2013 1105   CREATININE 0.6 03/31/2012 0934      Component Value  Date/Time   CALCIUM 8.6 05/01/2013 1105   CALCIUM 8.9 03/31/2012 0934   ALKPHOS 74 05/01/2013 1105   ALKPHOS 42 03/31/2012 0934   AST 16 05/01/2013 1105   AST 21 03/31/2012 0934   ALT 14 05/01/2013 1105   ALT 15 03/31/2012 0934   BILITOT 0.65 05/01/2013 1105   BILITOT 0.7 03/31/2012 0934       RADIOGRAPHIC STUDIES:  Ct Chest W Contrast  01/20/2013   *RADIOLOGY REPORT*  Clinical Data:  High risk breast cancer.  Evaluate for metastatic disease.  CT CHEST, ABDOMEN AND PELVIS WITH CONTRAST  Technique:  Multidetector CT imaging of the chest, abdomen and pelvis was performed following the standard protocol during bolus administration of intravenous contrast.  Contrast: OMNIPAQUE IOHEXOL 300 MG/ML  SOLN  Comparison:  None  CT CHEST  Findings:  Lungs/pleura: There is no pleural effusion.  No suspicious pulmonary nodule or mass noted.  Heart/Mediastinum: Heart size is normal.  No pericardial effusion. No mediastinal or hilar adenopathy.  Bones/Musculoskeletal:  Asymmetric increased soft tissue within the right breast parenchyma corresponding to patient's biopsy-proven invasive breast carcinoma.  There is associated overlying skin thickening.  Multiple enlarged right axillary lymph nodes are identified.  Index lymph node measures 1.7 cm, image 23/series 2. Several enhancing, sub centimeter retropectoral lymph nodes are identified.  IMPRESSION:  1.  No acute findings. 2.  Right breast invasive carcinoma with associated right axillary adenopathy.  CT ABDOMEN AND PELVIS  Findings:  There are no focal liver abnormalities identified.  The gallbladder appears normal.  There is no biliary dilatation.  The pancreas is unremarkable.  Normal appearance of the spleen.  The adrenal glands are both normal.  The kidneys are unremarkable. Urinary bladder appears normal.  The uterus is unremarkable. Corpus luteal cyst is noted in the left ovary measuring 1.6 cm.  No free fluid or fluid collections identified within the upper  abdomen or pelvis.  The abdominal aorta has a normal caliber. There is no adenopathy identified within the upper abdomen.  No pelvic or inguinal adenopathy noted.  The stomach is normal.  The small bowel loops are unremarkable. Normal appearance of the colon.  Review of the  visualized osseous structures is negative for aggressive lytic or sclerotic bone lesion.  IMPRESSION:  1.  No acute findings within the abdomen or pelvis. 2.  No mass or adenopathy.   Original Report Authenticated By: Signa Kell, M.D.   US Breast Left  01/09/2013   *RADIOLOGY REPORT*  Clinical Data:  47 year old female with newly diagnosed right breast cancer.  Left mammogram to compare to presurgical MRI.  DIGITAL DIAGNOSTIC LEFT MAMMOGRAM WITH CAD AND LEFT BREAST ULTRASOUND:  Comparison:  06/11/2012 mammogram and 01/09/2013 MRI  Findings:  ACR Breast Density Category 3: The breast tissue is heterogeneously dense.  A possible circumscribed mass in the upper outer left breast is identified. No suspicious calcifications or distortion identified.  Mammographic images were processed with CAD.  On physical exam, no palpable abnormalities identified within the upper left breast.  Ultrasound is performed, showing a 6 x 6 x 8 mm slightly irregular hypoechoic mass at the 11 o'clock position of the left breast 5 cm from the nipple An 11 x 7 x 9 mm slightly irregular hypoechoic mass at the 2 o'clock position of the left breast 4 cm from the nipple is also present. No enlarged or abnormal appearing left axillary lymph nodes are identified.  IMPRESSION: Indeterminate 6 x 6 x 8 mm hypoechoic mass at the 11 o'clock position of the left breast and 11 x 7 x 9 mm hypoechoic mass in the 2 o'clock position of the left breast.  The 11 o'clock position mass probably represents the enhancing nodule identified on today's MRI.  Tissue sampling of both masses is recommended.  BI-RADS CATEGORY 4:  Suspicious abnormality - biopsy should be considered.   RECOMMENDATION: Ultrasound guided biopsies of both left breast masses.  The findings and recommendations were discussed with the patient and she desires to proceed with ultrasound guided left breast biopsies. These biopsies will be performed today but dictated in a separate report.  Results were also provided in writing at the conclusion of the visit.  If applicable, a reminder letter will be sent to the patient regarding the next appointment.   Original Report Authenticated By: Harmon Pier, M.D.   Ct Abdomen Pelvis W Contrast  01/22/2013   *RADIOLOGY REPORT*  Clinical Data:  High risk breast cancer.  Evaluate for metastatic disease.  CT CHEST, ABDOMEN AND PELVIS WITH CONTRAST  Technique:  Multidetector CT imaging of the chest, abdomen and pelvis was performed following the standard protocol during bolus administration of intravenous contrast.  Contrast: OMNIPAQUE IOHEXOL 300 MG/ML  SOLN  Comparison:  None  CT CHEST  Findings:  Lungs/pleura: There is no pleural effusion.  No suspicious pulmonary nodule or mass noted.  Heart/Mediastinum: Heart size is normal.  No pericardial effusion. No mediastinal or hilar adenopathy.  Bones/Musculoskeletal:  Asymmetric increased soft tissue within the right breast parenchyma corresponding to patient's biopsy-proven invasive breast carcinoma.  There is associated overlying skin thickening.  Multiple enlarged right axillary lymph nodes are identified.  Index lymph node measures 1.7 cm, image 23/series 2. Several enhancing, sub centimeter retropectoral lymph nodes are identified.  IMPRESSION:  1.  No acute findings. 2.  Right breast invasive carcinoma with associated right axillary adenopathy.  CT ABDOMEN AND PELVIS  Findings:  There are no focal liver abnormalities identified.  The gallbladder appears normal.  There is no biliary dilatation.  The pancreas is unremarkable.  Normal appearance of the spleen.  The adrenal glands are both normal.  The kidneys are unremarkable.  Urinary bladder appears normal.  The uterus is unremarkable. Corpus luteal cyst is noted in the left ovary measuring 1.6 cm.  No free fluid or fluid collections identified within the upper abdomen or pelvis.  The abdominal aorta has a normal caliber. There is no adenopathy identified within the upper abdomen.  No pelvic or inguinal adenopathy noted.  The stomach is normal.  The small bowel loops are unremarkable. Normal appearance of the colon.  Review of the visualized osseous structures is negative for aggressive lytic or sclerotic bone lesion.  IMPRESSION:  1.  No acute findings within the abdomen or pelvis. 2.  No mass or adenopathy.   Original Report Authenticated By: Signa Kell, M.D.   Mr Breast Bilateral W Wo Contrast  01/09/2013   *RADIOLOGY REPORT*  Clinical Data: Biopsy-proven right breast inflammatory carcinoma with primary invasive mammary carcinoma and previous biopsy and known right axillary metastatic disease.  Preoperative staging MRI.  BUN and creatinine were obtained on site at [facility] at [address] Results:  BUN 10 mg/dL,  Creatinine 0.9 mg/dL.  BILATERAL BREAST MRI WITH AND WITHOUT CONTRAST  Technique: Multiplanar, multisequence MR images of both breasts were obtained prior to and following the intravenous administration of 13ml of Multihance.  Three dimensional images were evaluated at the independent DynaCad workstation.  Comparison:  Prior mammograms and ultrasound.  The patient underwent diagnostic mammography and ultrasound of the left breast today as no recent prior exam was available after the recent diagnosis in the right breast.  Findings: There is diffuse right breast skin thickening and edema, trabecular edema, and right axillary lymphadenopathy with maximal nodal measurement 3.1 cm short-axis diameter.  There is also a 5 mm internal mammary artery chain lymph node identified image 26 series 2. There is an incompletely visualized lymph node subjacent to the  pectoralis minor muscle on the first image of the T2-weighted series.  There is a large irregular enhancing mass in the right breast 11 o'clock location with associated clip artifact measuring maximally 8.0 x 5.6 by 4.7 cm. This demonstrates washin/washout type enhancement kinetics.  This corresponds to the biopsy-proven invasive mammary carcinoma.  There are abnormal nodular areas of trabecular has been throughout the right breast involving all four quadrants. No abnormal underlying pectoralis enhancement or chest wall involvement is identified.  In the left breast 11 o'clock location, there is an 8 mm enhancing nodule corresponding to that seen at the examination earlier today. This area demonstrates plateau type enhancement kinetics.  No left- sided axillary or internal mammary artery chain lymphadenopathy is identified.  There is mild inhomogeneous nodular enhancement in the left breast two o'clock location at the site of the mass seen in this location earlier today, measuring 9 mm, measuring progressive enhancement kinetics.  IMPRESSION: Large irregular enhancing right breast mass 11 o'clock location corresponding to biopsy-proven invasive mammary carcinoma, with metastatic right axillary lymphadenopathy as well as skin and trabecular thickening/enhancement compatible with the clinical diagnosis of inflammatory and/or locally advanced breast cancer.  5 mm abnormal right internal mammary artery chain lymph node.  8 mm enhancing mass left breast 11 o'clock location and mildly irregular area of mass-like enhancement in the left breast two o'clock location, corresponding to those seen at diagnostic examination earlier today.  Biopsy has been scheduled for today and will be dictated separately.  RECOMMENDATION: Treatment plan  THREE-DIMENSIONAL MR IMAGE RENDERING ON INDEPENDENT WORKSTATION:  Three-dimensional MR images were rendered by post-processing of the original MR data on an independent workstation.  The  three- dimensional  MR images were interpreted, and findings were reported in the accompanying complete MRI report for this study.  BI-RADS CATEGORY 6:  Known biopsy-proven malignancy - appropriate action should be taken.   Original Report Authenticated By: Christiana Pellant, M.D.   Nm Pet Image Initial (pi) Skull Base To Thigh  01/20/2013   *RADIOLOGY REPORT*  Clinical Data: Initial treatment strategy for breast cancer.  NUCLEAR MEDICINE PET SKULL BASE TO THIGH  Fasting Blood Glucose:  263  Technique:  19.1 mCi F-18 FDG was injected intravenously. CT data was obtained and used for attenuation correction and anatomic localization only.  (This was not acquired as a diagnostic CT examination.) Additional exam technical data entered on technologist worksheet.  Comparison:  None  Findings:  Neck: No hypermetabolic lymph nodes in the neck.  Chest:  No hypermetabolic mediastinal or hilar nodes.  No suspicious pulmonary nodules on the CT scan. Enlarged right axillary lymph nodes identified.  Index lymph node measures 1.7 cm and has an SUV max equal to 2.8, image 77.  There is asymmetric increased uptake throughout the right breast with associated overlying skin thickening.  This has an SUV max equal to 4.4, image 101.  Abdomen/Pelvis:  No abnormal hypermetabolic activity within the liver, pancreas, adrenal glands, or spleen.  No hypermetabolic lymph nodes in the abdomen or pelvis. There is a small focus of increased FDG uptake within the left ovary.  This has an SUV max equal to 13.1, image 217.  Skeleton:  No focal hypermetabolic activity to suggest skeletal metastasis.  IMPRESSION:  1.  Asymmetric increased uptake associated with the right breast and right breast skin thickening corresponding to biopsy-proven invasive breast cancer. 2.  Enlarged and hypermetabolic right axillary lymph nodes consistent with metastatic adenopathy. 3.  No evidence for distant metastatic disease. 4.  Focal area of increased uptake within the  left axilla.  The patient is noted to have a corpus luteal cyst are not diagnostic CT images.  This activity is favored to represent physiologic activity.  Consider confirmatory imaging with pelvic sonogram.   Original Report Authenticated By: Signa Kell, M.D.   Ir Fluoro Guide Cv Line Left  01/15/2013   *RADIOLOGY REPORT*  Clinical Data/Indication: Breast cancer  TUNNEL POWER PORT PLACEMENT WITH SUBCUTANEOUS POCKET UTILIZING ULTRASOUND & FLOUROSCOPY  Sedation: Versed 5.0 mg, Fentanyl 250 mcg.  Total Moderate Sedation Time: 35 minutes.  As antibiotic prophylaxis, Ancef  was ordered pre-procedure and administered intravenously within one hour of incision.  Fluoroscopy Time: 36 seconds.  Procedure:  After written informed consent was obtained, patient was placed in the supine position on angiographic table. The left neck and chest was prepped and draped in a sterile fashion. Lidocaine was utilized for local anesthesia.  The left internal jugular vein was noted to be patent initially with ultrasound. Under sonographic guidance, a micropuncture needle was inserted into the left IJ vein (Ultrasound and fluoroscopic image documentation was performed). The needle was removed over an 018 wire which was exchanged for a Amplatz.  This was advanced into the IVC.  An 8-French dilator was advanced over the Amplatz.  A small incision was made in the left upper chest over the anterior left second rib.  Utilizing blunt dissection, a subcutaneous pocket was created in the caudal direction. The pocket was irrigated with a copious amount of sterile normal saline.  The port catheter was tunneled from the chest incision, and out the neck incision.  The reservoir was inserted into the subcutaneous pocket and secured with  two 3-0 Ethilon stitches.  A peel-away sheath was advanced over the Amplatz wire.  The port catheter was cut to measure length and inserted through the peel-away sheath.  The peel-away sheath was removed.  The chest  incision was closed with 3-0 Vicryl interrupted stitches for the subcutaneous tissue and a running of 4- 0 Vicryl subcuticular stitch for the skin.  The neck incision was closed with a 4-0 Vicryl subcuticular stitch.  Derma-bond was applied to both surgical incisions.  The port reservoir was flushed and instilled with heparinized saline.  No complications.  Findings:  A left IJ vein Port-A-Cath is in place with its tip at the cavoatrial junction.  IMPRESSION: Successful 8 French left internal jugular vein power port placement with its tip at the SVC/RA junction.   Original Report Authenticated By: Jolaine Click, M.D.   Ir US Guide Vasc Access Left  01/15/2013   *RADIOLOGY REPORT*  Clinical Data/Indication: Breast cancer  TUNNEL POWER PORT PLACEMENT WITH SUBCUTANEOUS POCKET UTILIZING ULTRASOUND & FLOUROSCOPY  Sedation: Versed 5.0 mg, Fentanyl 250 mcg.  Total Moderate Sedation Time: 35 minutes.  As antibiotic prophylaxis, Ancef  was ordered pre-procedure and administered intravenously within one hour of incision.  Fluoroscopy Time: 36 seconds.  Procedure:  After written informed consent was obtained, patient was placed in the supine position on angiographic table. The left neck and chest was prepped and draped in a sterile fashion. Lidocaine was utilized for local anesthesia.  The left internal jugular vein was noted to be patent initially with ultrasound. Under sonographic guidance, a micropuncture needle was inserted into the left IJ vein (Ultrasound and fluoroscopic image documentation was performed). The needle was removed over an 018 wire which was exchanged for a Amplatz.  This was advanced into the IVC.  An 8-French dilator was advanced over the Amplatz.  A small incision was made in the left upper chest over the anterior left second rib.  Utilizing blunt dissection, a subcutaneous pocket was created in the caudal direction. The pocket was irrigated with a copious amount of sterile normal saline.  The port  catheter was tunneled from the chest incision, and out the neck incision.  The reservoir was inserted into the subcutaneous pocket and secured with two 3-0 Ethilon stitches.  A peel-away sheath was advanced over the Amplatz wire.  The port catheter was cut to measure length and inserted through the peel-away sheath.  The peel-away sheath was removed.  The chest incision was closed with 3-0 Vicryl interrupted stitches for the subcutaneous tissue and a running of 4- 0 Vicryl subcuticular stitch for the skin.  The neck incision was closed with a 4-0 Vicryl subcuticular stitch.  Derma-bond was applied to both surgical incisions.  The port reservoir was flushed and instilled with heparinized saline.  No complications.  Findings:  A left IJ vein Port-A-Cath is in place with its tip at the cavoatrial junction.  IMPRESSION: Successful 8 French left internal jugular vein power port placement with its tip at the SVC/RA junction.   Original Report Authenticated By: Jolaine Click, M.D.   Mm Digital Diagnostic Unilat L  01/09/2013   *RADIOLOGY REPORT*  Clinical Data:  Evaluate clip placement following two separate ultrasound-guided left breast biopsies.  DIGITAL DIAGNOSTIC LEFT MAMMOGRAM  Comparison:  Previous exams  Findings:  Films are performed following two separate ultrasound guided biopsies of the left breast - at the 11 o'clock position and 2 o'clock position. The ribbon shaped biopsy clip corresponds to the 11 o'clock mass and  the T shaped biopsy clip corresponds to the 2 o'clock mass. No immediate complications are identified.  IMPRESSION: Satisfactory clip placement following ultrasound guided left breast biopsies.  Pathology will be followed.   Original Report Authenticated By: Harmon Pier, M.D.   Mm Digital Diagnostic Unilat L  01/09/2013   *RADIOLOGY REPORT*  Clinical Data:  47 year old female with newly diagnosed right breast cancer.  Left mammogram to compare to presurgical MRI.  DIGITAL DIAGNOSTIC LEFT  MAMMOGRAM WITH CAD AND LEFT BREAST ULTRASOUND:  Comparison:  06/11/2012 mammogram and 01/09/2013 MRI  Findings:  ACR Breast Density Category 3: The breast tissue is heterogeneously dense.  A possible circumscribed mass in the upper outer left breast is identified. No suspicious calcifications or distortion identified.  Mammographic images were processed with CAD.  On physical exam, no palpable abnormalities identified within the upper left breast.  Ultrasound is performed, showing a 6 x 6 x 8 mm slightly irregular hypoechoic mass at the 11 o'clock position of the left breast 5 cm from the nipple An 11 x 7 x 9 mm slightly irregular hypoechoic mass at the 2 o'clock position of the left breast 4 cm from the nipple is also present. No enlarged or abnormal appearing left axillary lymph nodes are identified.  IMPRESSION: Indeterminate 6 x 6 x 8 mm hypoechoic mass at the 11 o'clock position of the left breast and 11 x 7 x 9 mm hypoechoic mass in the 2 o'clock position of the left breast.  The 11 o'clock position mass probably represents the enhancing nodule identified on today's MRI.  Tissue sampling of both masses is recommended.  BI-RADS CATEGORY 4:  Suspicious abnormality - biopsy should be considered.  RECOMMENDATION: Ultrasound guided biopsies of both left breast masses.  The findings and recommendations were discussed with the patient and she desires to proceed with ultrasound guided left breast biopsies. These biopsies will be performed today but dictated in a separate report.  Results were also provided in writing at the conclusion of the visit.  If applicable, a reminder letter will be sent to the patient regarding the next appointment.   Original Report Authenticated By: Harmon Pier, M.D.   [place] Lt Breast Bx W Loc Dev 1st Lesion Img Bx Spec US Guide  01/12/2013   *RADIOLOGY REPORT*  Clinical Data:  47 year old female with newly diagnosed right breast cancer.  Two left breast masses identified sonographically - 6 x  8 mm at the 11 o'clock position and 9 x 11 mm at the 2 o'clock position. For tissue sampling of the 6 x 8 mm mass at the 11 o'clock position.  ULTRASOUND GUIDED VACUUM ASSISTED CORE BIOPSY OF THE LEFT BREAST  Comparison: Previous exams.  I met with the patient and we discussed the procedure of ultrasound- guided biopsy, including benefits and alternatives.  We discussed the high likelihood of a successful procedure. We discussed the risks of the procedure including infection, bleeding, tissue injury, clip migration, and inadequate sampling.  Informed written consent was given.  Using sterile technique and 2% Lidocaine as local anesthetic, under direct ultrasound visualization, a12 gauge vacuum-assisteddevice was used to perform biopsy of the 6 x 8 mm hypoechoic mass at the 11 o'clock position of the left breast 5 cm from the nipple using a medial approach. At the conclusion of the procedure, a ribbon shaped tissue marker clip was deployed into the biopsy cavity. Follow-up 2-view mammogram was performed and dictated separately.  The usual time-out protocol was performed immediately prior to the procedure.  IMPRESSION: Ultrasound-guided biopsy of 6 x 8 mm hypoechoic mass at the 11 o'clock position of the left breast.  No apparent complications.  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  Histology correlates with imaging findings.  The patient was contacted by phone on 01/12/2013 and these results given to her which she understood. Her questions were answered. The patient had no complaints with her biopsy site.  Recommend treatment plan.   Original Report Authenticated By: Harmon Pier, M.D.   [place] Lt Breast Bx W Loc Dev Ea Add Lesion Img Bx Spec US Guide  01/12/2013   *RADIOLOGY REPORT*  Clinical Data:  46 year old female with newly diagnosed right breast cancer.  Two left breast masses identified sonographically - 6 x 8 mm at the 11 o'clock position and 9 x  11 mm at the 2 o'clock position. For tissue sampling of the 9 x 11 mm mass at the 2 o'clock position.  ULTRASOUND GUIDED VACUUM ASSISTED CORE BIOPSY OF THE LEFT BREAST  Comparison: Previous exams.  I met with the patient and we discussed the procedure of ultrasound- guided biopsy, including benefits and alternatives.  We discussed the high likelihood of a successful procedure. We discussed the risks of the procedure including infection, bleeding, tissue injury, clip migration, and inadequate sampling.  Informed written consent was given.  Using sterile technique and 2% Lidocaine as local anesthetic, under direct ultrasound visualization, a 12 gauge vacuum-assisteddevice was used to perform biopsy of the 9 x 11 mm hypoechoic mass at the 2 o'clock position of the left breast 4 cm from the nipple using a medial approach. At the conclusion of the procedure, a  tissue marker clip was deployed into the biopsy cavity.  Follow-up 2-view mammogram was performed and dictated separately.  The usual time-out protocol was performed immediately prior to the procedure.  IMPRESSION: Ultrasound-guided biopsy of 9 x 11 mm mass in the 2 o'clock position of the left breast.  No apparent complications.  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 Histology correlates with imaging findings.  The patient was contacted by phone on 01/12/2013 and these results given to her which she understood. Her questions were answered. The patient had no complaints with her biopsy site.  Recommend treatment plan.   Original Report Authenticated By: Harmon Pier, M.D.    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 Patient is receiving neoadjuvant chemotherapy and her to therapy. She is receiving double HER-2 therapy with Herceptin and perjeta. She receives  chemotherapy consisting of Taxotere and carboplatinum. All drugs are given every 3 weeks with day 2 Neulasta. She began her treatment on 01/16/2013. Thus far she is tolerating it well.  #2 patient is set up to see the genetic counselor in the next few weeks since she does have  unde breast cancer under the age of 55. We again today discussed the rationale for referring her to genetic counseling. 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  #3 left lower extremity edema: Likely secondary to chemotherapy.  #4 nail bed changes: secondary to chemotherapy patient is reassured   PLAN:   #1 overall patient tolerated cycle 5 of her chemotherapy very well she has no  complaints other than weakness.  #2 she is scheduled for breast MRI these.  #3 she'll be seen back in 2 weeks' time for her final cycle of her chemotherapy.  All questions were answered. The patient knows to call the clinic with any problems, questions or concerns. We can certainly see the patient much sooner if necessary.  I spent 25 minutes counseling the patient face to face. The total time spent in the appointment was 30 minutes.    Drue Second, MD Medical/Oncology [facility] [phone] (beeper) [phone] (Office)

## 2013-05-08 ENCOUNTER — Telehealth: Payer: Self-pay | Admitting: *Deleted

## 2013-05-08 ENCOUNTER — Ambulatory Visit (HOSPITAL_BASED_OUTPATIENT_CLINIC_OR_DEPARTMENT_OTHER): Payer: BC Managed Care – PPO | Admitting: Oncology

## 2013-05-08 ENCOUNTER — Other Ambulatory Visit (HOSPITAL_BASED_OUTPATIENT_CLINIC_OR_DEPARTMENT_OTHER): Payer: BC Managed Care – PPO | Admitting: Lab

## 2013-05-08 ENCOUNTER — Telehealth: Payer: Self-pay | Admitting: Oncology

## 2013-05-08 VITALS — BP 90/64 | HR 118 | Temp 98.0°F | Resp 20 | Ht 64.0 in | Wt 138.3 lb

## 2013-05-08 DIAGNOSIS — C50419 Malignant neoplasm of upper-outer quadrant of unspecified female breast: Secondary | ICD-10-CM

## 2013-05-08 DIAGNOSIS — Z171 Estrogen receptor negative status [ER-]: Secondary | ICD-10-CM

## 2013-05-08 DIAGNOSIS — C50911 Malignant neoplasm of unspecified site of right female breast: Secondary | ICD-10-CM

## 2013-05-08 DIAGNOSIS — C773 Secondary and unspecified malignant neoplasm of axilla and upper limb lymph nodes: Secondary | ICD-10-CM

## 2013-05-08 DIAGNOSIS — M7989 Other specified soft tissue disorders: Secondary | ICD-10-CM

## 2013-05-08 LAB — CBC WITH DIFFERENTIAL/PLATELET
BASO%: 0.7 % (ref 0.0–2.0)
EOS%: 0.5 % (ref 0.0–7.0)
HCT: 34.2 % — ABNORMAL LOW (ref 34.8–46.6)
LYMPH%: 22.9 % (ref 14.0–49.7)
MCH: 32.4 pg (ref 25.1–34.0)
MCHC: 34.4 g/dL (ref 31.5–36.0)
MCV: 94.3 fL (ref 79.5–101.0)
MONO%: 22.8 % — ABNORMAL HIGH (ref 0.0–14.0)
NEUT%: 53.1 % (ref 38.4–76.8)
Platelets: 209 10*3/uL (ref 145–400)

## 2013-05-08 LAB — COMPREHENSIVE METABOLIC PANEL (CC13)
ALT: 22 U/L (ref 0–55)
AST: 25 U/L (ref 5–34)
Alkaline Phosphatase: 80 U/L (ref 40–150)
CO2: 22 mEq/L (ref 22–29)
Creatinine: 0.8 mg/dL (ref 0.6–1.1)
Total Bilirubin: 0.97 mg/dL (ref 0.20–1.20)

## 2013-05-08 NOTE — Patient Instructions (Addendum)
Proceed with surgery on 10/28  We will see you back on 11/4 for follow up and first adjuvant herceptin

## 2013-05-08 NOTE — Telephone Encounter (Signed)
Per staff message and POF I have scheduled appts.  [name]  

## 2013-05-12 ENCOUNTER — Encounter (INDEPENDENT_AMBULATORY_CARE_PROVIDER_SITE_OTHER): Payer: BC Managed Care – PPO | Admitting: General Surgery

## 2013-05-20 NOTE — Progress Notes (Signed)
OFFICE PROGRESS NOTE  CC  [name], MD [address] [place] [address] Dr. Lurline Hare  Dr. 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 At that time she had mammogram performed and it was negative. However her breast continue to increase in size. Initially patient felt that it could have been cysts or something on and therefore she did not go to a physician. However subsequently patient's breast became quite painful and she also began to feel in knot under her right arm.because of this she went to her physician who did a physical exam and a mammogram was performed on 01/01/2013 the mammogram showed diffuse skin thickening generalized increased density within the right breast and enlarged right axillary lymph nodes. She went on to have an ultrasound of the right breast performed that showed a large spiculated hypoechoic mass in the right breast 11:00 2 cm from the nipple measuring at least 4.5 cm in size. There were abnormal lymph nodes with thickened cortex and the right axilla the largest one measuring 3.1 cm in dimension.  #2Patient was recommended ultrasound guided core biopsy of the right breast mass and the right axilla lymph node. The biopsy was performed on 01/01/2013. The right needle core biopsy of the primary mass showed invasive mammary carcinoma grade 2-3 was invasive ductal. The lymph node of the right axilla was positive for metastatic disease. The tumor was ER PR negative HER-2/neu positive with a Ki-67 of 76%. On 01/09/2013 patient had MRI of the breasts performed that showed diffuse right breast skin thickening and edema with right axillary lymph node. There was a 5 mm internal mammary lymph node that  was incompletely imaged. The right breast mass measured 8 x 5 x 4.7 cm. There were abnormal areas involving all 4 quadrants. There was however no involvement of the pectoralis minor for lower chest wall. The left breast showed 2 areas of nodular enhancement these were biopsied and were fibroadenomas  #3 it was recommended that patient begin neoadjuvant chemotherapy consisting of Taxotere carboplatinum Herceptin and perjeta. This would be given every 3 weeks with day 2 Neulasta. A total of 6 cycles of therapy were planned. Once she completes the treatment then she would proceed with MRI of the breasts again for evaluation a response to therapy and eventually a mastectomy.  #4 patient began Neoadjuvant with curative intentTaxotere carboplatinum Herceptin and perjeta starting 01/16/2013 - 05/01/2013.  CURRENT THERAPY:  Neoadjuvant cycle 6 day 8 TCH/perjeta   INTERVAL HISTORY: Jasmine Schwartz 47 y.o. female returns for followup visit today. With the final cycle of the chemotherapy patient does tell me that she is quite fatigued and tired. This cycle had a pretty hard. She spent most of her week basically doing nothing. She has not been eating as well as she had previously. However she denies having any nausea headaches double vision blurring of vision no difficulty swallowing no shortness of breath no chest pains or palpitations. She does have some peripheral paresthesias but they are now improving. She has no bleeding problems no urinary tract infections. Remainder of the 10 point review of systems is negative.  MEDICAL HISTORY: Past Medical History  Diagnosis Date  . Anxiety   . Depression   . Dyslipidemia   .  Anxiety and depression 07/06/2011  . Breast cancer 01/01/13    /metastatic 1/1 lymph nodes  . Diabetes mellitus     IDDM  . Hx of insertion of insulin pump     ALLERGIES:  has No Known Allergies.  MEDICATIONS:  Current Outpatient Prescriptions  Medication Sig Dispense Refill  .  ALPRAZolam (XANAX) 0.25 MG tablet Take 0.25 mg by mouth 3 (three) times daily as needed for sleep or anxiety.      . Alum & Mag Hydroxide-Simeth (MAGIC MOUTHWASH W/LIDOCAINE) SOLN Take 5 mLs by mouth 4 (four) times daily as needed. Swish and spit.  120 mL  1  . dexamethasone (DECADRON) 4 MG tablet Take 2 tablets (8 mg total) by mouth 2 (two) times daily with a meal. Take two times a day the day before Taxotere. Then take two times a day starting the day after chemo for 3 days.  40 tablet  3  . fluconazole (DIFLUCAN) 200 MG tablet Take 1 tablet (200 mg total) by mouth daily.  5 tablet  5  . gabapentin (NEURONTIN) 100 MG capsule Take 1 capsule (100 mg total) by mouth 2 (two) times daily.  60 capsule  6  . glucose blood (NOVA MAX TEST) test strip Use as directed up to 8 times a day      . Insulin Infusion Pump (MINIMED INSULIN PUMP) DEVI Inject 1 Units/hr into the vein continuous. Pt give extra per cards at meal times. Basic meals are about 5 units      . Insulin Infusion Pump Supplies (MINIMED INFUSION SET-MMT 397) MISC 1 Device by Does not apply route every 3 (three) days.      . Insulin Infusion Pump Supplies (PARADIGM PUMP RESERVOIR 1.76ML) MISC 1 Device by Does not apply route every 3 (three) days.      . insulin lispro (HUMALOG) 100 UNIT/ML injection Inject as directed into Insulin pump, total of 40 units/day  20 mL  2  . lidocaine-prilocaine (EMLA) cream Apply topically as needed. Apply to port-a-cath site 1-2 hours before treatment.  30 g  3  . LORazepam (ATIVAN) 0.5 MG tablet Take 1 tablet (0.5 mg total) by mouth every 6 (six) hours as needed (Nausea or vomiting).  30 tablet  2  . ondansetron (ZOFRAN) 8 MG tablet Take 8 mg by mouth 2 (two) times daily. Take two times a day starting the day after chemo for 3 days. Then take two times a day as needed for nausea or vomiting.      . prochlorperazine (COMPAZINE) 10 MG tablet Take 1 tablet (10 mg total) by mouth every 6 (six) hours as needed (Nausea or  vomiting).  30 tablet  1  . prochlorperazine (COMPAZINE) 25 MG suppository Place 25 mg rectally every 12 (twelve) hours as needed for nausea.      . sucralfate (CARAFATE) 1 GM/10ML suspension Take 10 mLs (1 g total) by mouth 4 (four) times daily.  420 mL  6  . valACYclovir (VALTREX) 500 MG tablet Take 1 tablet (500 mg total) by mouth 2 (two) times daily.  30 tablet  4   No current facility-administered medications for this visit.    SURGICAL HISTORY:  Past Surgical History  Procedure Laterality Date  . Lumbar spine surgery  2009    Dr Jillyn Hidden  . Cesarean section  1989  . Tubal ligation  1991  . Breast biopsy Right 01/01/13    Invasive mammary ca,metastatic in 1/1 lymph node  . Breast biopsy Left  01/09/13    Fibroadenoma,microcalcifications    REVIEW OF SYSTEMS:  Pertinent items are noted in HPI.   HEALTH MAINTENANCE:  PHYSICAL EXAMINATION: Blood pressure 90/64, pulse 118, temperature 98 F (36.7 C), temperature source Oral, resp. rate 20, height 5\' 4"  (1.626 m), weight 138 lb 4.8 oz (62.732 kg). Body mass index is 23.73 kg/(m^2). ECOG PERFORMANCE STATUS: 0 - Asymptomatic   General appearance: alert, cooperative and appears stated age Resp: clear to auscultation bilaterally Cardio: regular rate and rhythm GI: soft, non-tender; bowel sounds normal; no masses,  no organomegaly Extremities: extremities normal, atraumatic, no cyanosis or edema Neurologic: Grossly normal Right breast reveals a large palpable mass without any nipple discharge it is softer Left breast no masses or nipple discharge  LABORATORY DATA: Lab Results  Component Value Date   WBC 5.6 05/08/2013   HGB 11.8 05/08/2013   HCT 34.2* 05/08/2013   MCV 94.3 05/08/2013   PLT 209 05/08/2013      Chemistry      Component Value Date/Time   NA 130* 05/08/2013 1108   NA 140 03/31/2012 0934   K 4.7 05/08/2013 1108   K 4.7 03/31/2012 0934   CL 99 01/23/2013 1251   CL 106 03/31/2012 0934   CO2 22 05/08/2013 1108   CO2 27  03/31/2012 0934   BUN 9.1 05/08/2013 1108   BUN 13 03/31/2012 0934   CREATININE 0.8 05/08/2013 1108   CREATININE 0.6 03/31/2012 0934      Component Value Date/Time   CALCIUM 9.1 05/08/2013 1108   CALCIUM 8.9 03/31/2012 0934   ALKPHOS 80 05/08/2013 1108   ALKPHOS 42 03/31/2012 0934   AST 25 05/08/2013 1108   AST 21 03/31/2012 0934   ALT 22 05/08/2013 1108   ALT 15 03/31/2012 0934   BILITOT 0.97 05/08/2013 1108   BILITOT 0.7 03/31/2012 0934       RADIOGRAPHIC STUDIES:  Ct Chest W Contrast  01/20/2013   *RADIOLOGY REPORT*  Clinical Data:  High risk breast cancer.  Evaluate for metastatic disease.  CT CHEST, ABDOMEN AND PELVIS WITH CONTRAST  Technique:  Multidetector CT imaging of the chest, abdomen and pelvis was performed following the standard protocol during bolus administration of intravenous contrast.  Contrast: OMNIPAQUE IOHEXOL 300 MG/ML  SOLN  Comparison:  None  CT CHEST  Findings:  Lungs/pleura: There is no pleural effusion.  No suspicious pulmonary nodule or mass noted.  Heart/Mediastinum: Heart size is normal.  No pericardial effusion. No mediastinal or hilar adenopathy.  Bones/Musculoskeletal:  Asymmetric increased soft tissue within the right breast parenchyma corresponding to patient's biopsy-proven invasive breast carcinoma.  There is associated overlying skin thickening.  Multiple enlarged right axillary lymph nodes are identified.  Index lymph node measures 1.7 cm, image 23/series 2. Several enhancing, sub centimeter retropectoral lymph nodes are identified.  IMPRESSION:  1.  No acute findings. 2.  Right breast invasive carcinoma with associated right axillary adenopathy.  CT ABDOMEN AND PELVIS  Findings:  There are no focal liver abnormalities identified.  The gallbladder appears normal.  There is no biliary dilatation.  The pancreas is unremarkable.  Normal appearance of the spleen.  The adrenal glands are both normal.  The kidneys are unremarkable. Urinary bladder appears normal.  The  uterus is unremarkable. Corpus luteal cyst is noted in the left ovary measuring 1.6 cm.  No free fluid or fluid collections identified within the upper abdomen or pelvis.  The abdominal aorta has a normal caliber. There is no adenopathy identified  within the upper abdomen.  No pelvic or inguinal adenopathy noted.  The stomach is normal.  The small bowel loops are unremarkable. Normal appearance of the colon.  Review of the visualized osseous structures is negative for aggressive lytic or sclerotic bone lesion.  IMPRESSION:  1.  No acute findings within the abdomen or pelvis. 2.  No mass or adenopathy.   Original Report Authenticated By: Signa Kell, M.D.   US Breast Left  01/09/2013   *RADIOLOGY REPORT*  Clinical Data:  47 year old female with newly diagnosed right breast cancer.  Left mammogram to compare to presurgical MRI.  DIGITAL DIAGNOSTIC LEFT MAMMOGRAM WITH CAD AND LEFT BREAST ULTRASOUND:  Comparison:  06/11/2012 mammogram and 01/09/2013 MRI  Findings:  ACR Breast Density Category 3: The breast tissue is heterogeneously dense.  A possible circumscribed mass in the upper outer left breast is identified. No suspicious calcifications or distortion identified.  Mammographic images were processed with CAD.  On physical exam, no palpable abnormalities identified within the upper left breast.  Ultrasound is performed, showing a 6 x 6 x 8 mm slightly irregular hypoechoic mass at the 11 o'clock position of the left breast 5 cm from the nipple An 11 x 7 x 9 mm slightly irregular hypoechoic mass at the 2 o'clock position of the left breast 4 cm from the nipple is also present. No enlarged or abnormal appearing left axillary lymph nodes are identified.  IMPRESSION: Indeterminate 6 x 6 x 8 mm hypoechoic mass at the 11 o'clock position of the left breast and 11 x 7 x 9 mm hypoechoic mass in the 2 o'clock position of the left breast.  The 11 o'clock position mass probably represents the enhancing nodule identified on  today's MRI.  Tissue sampling of both masses is recommended.  BI-RADS CATEGORY 4:  Suspicious abnormality - biopsy should be considered.  RECOMMENDATION: Ultrasound guided biopsies of both left breast masses.  The findings and recommendations were discussed with the patient and she desires to proceed with ultrasound guided left breast biopsies. These biopsies will be performed today but dictated in a separate report.  Results were also provided in writing at the conclusion of the visit.  If applicable, a reminder letter will be sent to the patient regarding the next appointment.   Original Report Authenticated By: Harmon Pier, M.D.   Ct Abdomen Pelvis W Contrast  01/22/2013   *RADIOLOGY REPORT*  Clinical Data:  High risk breast cancer.  Evaluate for metastatic disease.  CT CHEST, ABDOMEN AND PELVIS WITH CONTRAST  Technique:  Multidetector CT imaging of the chest, abdomen and pelvis was performed following the standard protocol during bolus administration of intravenous contrast.  Contrast: OMNIPAQUE IOHEXOL 300 MG/ML  SOLN  Comparison:  None  CT CHEST  Findings:  Lungs/pleura: There is no pleural effusion.  No suspicious pulmonary nodule or mass noted.  Heart/Mediastinum: Heart size is normal.  No pericardial effusion. No mediastinal or hilar adenopathy.  Bones/Musculoskeletal:  Asymmetric increased soft tissue within the right breast parenchyma corresponding to patient's biopsy-proven invasive breast carcinoma.  There is associated overlying skin thickening.  Multiple enlarged right axillary lymph nodes are identified.  Index lymph node measures 1.7 cm, image 23/series 2. Several enhancing, sub centimeter retropectoral lymph nodes are identified.  IMPRESSION:  1.  No acute findings. 2.  Right breast invasive carcinoma with associated right axillary adenopathy.  CT ABDOMEN AND PELVIS  Findings:  There are no focal liver abnormalities identified.  The gallbladder appears normal.  There  is no biliary  dilatation.  The pancreas is unremarkable.  Normal appearance of the spleen.  The adrenal glands are both normal.  The kidneys are unremarkable. Urinary bladder appears normal.  The uterus is unremarkable. Corpus luteal cyst is noted in the left ovary measuring 1.6 cm.  No free fluid or fluid collections identified within the upper abdomen or pelvis.  The abdominal aorta has a normal caliber. There is no adenopathy identified within the upper abdomen.  No pelvic or inguinal adenopathy noted.  The stomach is normal.  The small bowel loops are unremarkable. Normal appearance of the colon.  Review of the visualized osseous structures is negative for aggressive lytic or sclerotic bone lesion.  IMPRESSION:  1.  No acute findings within the abdomen or pelvis. 2.  No mass or adenopathy.   Original Report Authenticated By: Signa Kell, M.D.   Mr Breast Bilateral W Wo Contrast  01/09/2013   *RADIOLOGY REPORT*  Clinical Data: Biopsy-proven right breast inflammatory carcinoma with primary invasive mammary carcinoma and previous biopsy and known right axillary metastatic disease.  Preoperative staging MRI.  BUN and creatinine were obtained on site at [facility] at [address] Results:  BUN 10 mg/dL,  Creatinine 0.9 mg/dL.  BILATERAL BREAST MRI WITH AND WITHOUT CONTRAST  Technique: Multiplanar, multisequence MR images of both breasts were obtained prior to and following the intravenous administration of 13ml of Multihance.  Three dimensional images were evaluated at the independent DynaCad workstation.  Comparison:  Prior mammograms and ultrasound.  The patient underwent diagnostic mammography and ultrasound of the left breast today as no recent prior exam was available after the recent diagnosis in the right breast.  Findings: There is diffuse right breast skin thickening and edema, trabecular edema, and right axillary lymphadenopathy with maximal nodal measurement 3.1 cm short-axis diameter.  There is  also a 5 mm internal mammary artery chain lymph node identified image 26 series 2. There is an incompletely visualized lymph node subjacent to the pectoralis minor muscle on the first image of the T2-weighted series.  There is a large irregular enhancing mass in the right breast 11 o'clock location with associated clip artifact measuring maximally 8.0 x 5.6 by 4.7 cm. This demonstrates washin/washout type enhancement kinetics.  This corresponds to the biopsy-proven invasive mammary carcinoma.  There are abnormal nodular areas of trabecular has been throughout the right breast involving all four quadrants. No abnormal underlying pectoralis enhancement or chest wall involvement is identified.  In the left breast 11 o'clock location, there is an 8 mm enhancing nodule corresponding to that seen at the examination earlier today. This area demonstrates plateau type enhancement kinetics.  No left- sided axillary or internal mammary artery chain lymphadenopathy is identified.  There is mild inhomogeneous nodular enhancement in the left breast two o'clock location at the site of the mass seen in this location earlier today, measuring 9 mm, measuring progressive enhancement kinetics.  IMPRESSION: Large irregular enhancing right breast mass 11 o'clock location corresponding to biopsy-proven invasive mammary carcinoma, with metastatic right axillary lymphadenopathy as well as skin and trabecular thickening/enhancement compatible with the clinical diagnosis of inflammatory and/or locally advanced breast cancer.  5 mm abnormal right internal mammary artery chain lymph node.  8 mm enhancing mass left breast 11 o'clock location and mildly irregular area of mass-like enhancement in the left breast two o'clock location, corresponding to those seen at diagnostic examination earlier today.  Biopsy has been scheduled for today and will be dictated separately.  RECOMMENDATION: Treatment plan  THREE-DIMENSIONAL MR IMAGE RENDERING ON  INDEPENDENT WORKSTATION:  Three-dimensional MR images were rendered by post-processing of the original MR data on an independent workstation.  The three- dimensional MR images were interpreted, and findings were reported in the accompanying complete MRI report for this study.  BI-RADS CATEGORY 6:  Known biopsy-proven malignancy - appropriate action should be taken.   Original Report Authenticated By: Christiana Pellant, M.D.   Nm Pet Image Initial (pi) Skull Base To Thigh  01/20/2013   *RADIOLOGY REPORT*  Clinical Data: Initial treatment strategy for breast cancer.  NUCLEAR MEDICINE PET SKULL BASE TO THIGH  Fasting Blood Glucose:  263  Technique:  19.1 mCi F-18 FDG was injected intravenously. CT data was obtained and used for attenuation correction and anatomic localization only.  (This was not acquired as a diagnostic CT examination.) Additional exam technical data entered on technologist worksheet.  Comparison:  None  Findings:  Neck: No hypermetabolic lymph nodes in the neck.  Chest:  No hypermetabolic mediastinal or hilar nodes.  No suspicious pulmonary nodules on the CT scan. Enlarged right axillary lymph nodes identified.  Index lymph node measures 1.7 cm and has an SUV max equal to 2.8, image 77.  There is asymmetric increased uptake throughout the right breast with associated overlying skin thickening.  This has an SUV max equal to 4.4, image 101.  Abdomen/Pelvis:  No abnormal hypermetabolic activity within the liver, pancreas, adrenal glands, or spleen.  No hypermetabolic lymph nodes in the abdomen or pelvis. There is a small focus of increased FDG uptake within the left ovary.  This has an SUV max equal to 13.1, image 217.  Skeleton:  No focal hypermetabolic activity to suggest skeletal metastasis.  IMPRESSION:  1.  Asymmetric increased uptake associated with the right breast and right breast skin thickening corresponding to biopsy-proven invasive breast cancer. 2.  Enlarged and hypermetabolic right  axillary lymph nodes consistent with metastatic adenopathy. 3.  No evidence for distant metastatic disease. 4.  Focal area of increased uptake within the left axilla.  The patient is noted to have a corpus luteal cyst are not diagnostic CT images.  This activity is favored to represent physiologic activity.  Consider confirmatory imaging with pelvic sonogram.   Original Report Authenticated By: Signa Kell, M.D.   Ir Fluoro Guide Cv Line Left  01/15/2013   *RADIOLOGY REPORT*  Clinical Data/Indication: Breast cancer  TUNNEL POWER PORT PLACEMENT WITH SUBCUTANEOUS POCKET UTILIZING ULTRASOUND & FLOUROSCOPY  Sedation: Versed 5.0 mg, Fentanyl 250 mcg.  Total Moderate Sedation Time: 35 minutes.  As antibiotic prophylaxis, Ancef  was ordered pre-procedure and administered intravenously within one hour of incision.  Fluoroscopy Time: 36 seconds.  Procedure:  After written informed consent was obtained, patient was placed in the supine position on angiographic table. The left neck and chest was prepped and draped in a sterile fashion. Lidocaine was utilized for local anesthesia.  The left internal jugular vein was noted to be patent initially with ultrasound. Under sonographic guidance, a micropuncture needle was inserted into the left IJ vein (Ultrasound and fluoroscopic image documentation was performed). The needle was removed over an 018 wire which was exchanged for a Amplatz.  This was advanced into the IVC.  An 8-French dilator was advanced over the Amplatz.  A small incision was made in the left upper chest over the anterior left second rib.  Utilizing blunt dissection, a subcutaneous pocket was created in the caudal direction. The pocket was irrigated with a  copious amount of sterile normal saline.  The port catheter was tunneled from the chest incision, and out the neck incision.  The reservoir was inserted into the subcutaneous pocket and secured with two 3-0 Ethilon stitches.  A peel-away sheath was advanced  over the Amplatz wire.  The port catheter was cut to measure length and inserted through the peel-away sheath.  The peel-away sheath was removed.  The chest incision was closed with 3-0 Vicryl interrupted stitches for the subcutaneous tissue and a running of 4- 0 Vicryl subcuticular stitch for the skin.  The neck incision was closed with a 4-0 Vicryl subcuticular stitch.  Derma-bond was applied to both surgical incisions.  The port reservoir was flushed and instilled with heparinized saline.  No complications.  Findings:  A left IJ vein Port-A-Cath is in place with its tip at the cavoatrial junction.  IMPRESSION: Successful 8 French left internal jugular vein power port placement with its tip at the SVC/RA junction.   Original Report Authenticated By: Jolaine Click, M.D.   Ir US Guide Vasc Access Left  01/15/2013   *RADIOLOGY REPORT*  Clinical Data/Indication: Breast cancer  TUNNEL POWER PORT PLACEMENT WITH SUBCUTANEOUS POCKET UTILIZING ULTRASOUND & FLOUROSCOPY  Sedation: Versed 5.0 mg, Fentanyl 250 mcg.  Total Moderate Sedation Time: 35 minutes.  As antibiotic prophylaxis, Ancef  was ordered pre-procedure and administered intravenously within one hour of incision.  Fluoroscopy Time: 36 seconds.  Procedure:  After written informed consent was obtained, patient was placed in the supine position on angiographic table. The left neck and chest was prepped and draped in a sterile fashion. Lidocaine was utilized for local anesthesia.  The left internal jugular vein was noted to be patent initially with ultrasound. Under sonographic guidance, a micropuncture needle was inserted into the left IJ vein (Ultrasound and fluoroscopic image documentation was performed). The needle was removed over an 018 wire which was exchanged for a Amplatz.  This was advanced into the IVC.  An 8-French dilator was advanced over the Amplatz.  A small incision was made in the left upper chest over the anterior left second rib.  Utilizing blunt  dissection, a subcutaneous pocket was created in the caudal direction. The pocket was irrigated with a copious amount of sterile normal saline.  The port catheter was tunneled from the chest incision, and out the neck incision.  The reservoir was inserted into the subcutaneous pocket and secured with two 3-0 Ethilon stitches.  A peel-away sheath was advanced over the Amplatz wire.  The port catheter was cut to measure length and inserted through the peel-away sheath.  The peel-away sheath was removed.  The chest incision was closed with 3-0 Vicryl interrupted stitches for the subcutaneous tissue and a running of 4- 0 Vicryl subcuticular stitch for the skin.  The neck incision was closed with a 4-0 Vicryl subcuticular stitch.  Derma-bond was applied to both surgical incisions.  The port reservoir was flushed and instilled with heparinized saline.  No complications.  Findings:  A left IJ vein Port-A-Cath is in place with its tip at the cavoatrial junction.  IMPRESSION: Successful 8 French left internal jugular vein power port placement with its tip at the SVC/RA junction.   Original Report Authenticated By: Jolaine Click, M.D.   Mm Digital Diagnostic Unilat L  01/09/2013   *RADIOLOGY REPORT*  Clinical Data:  Evaluate clip placement following two separate ultrasound-guided left breast biopsies.  DIGITAL DIAGNOSTIC LEFT MAMMOGRAM  Comparison:  Previous exams  Findings:  Films are  performed following two separate ultrasound guided biopsies of the left breast - at the 11 o'clock position and 2 o'clock position. The ribbon shaped biopsy clip corresponds to the 11 o'clock mass and the T shaped biopsy clip corresponds to the 2 o'clock mass. No immediate complications are identified.  IMPRESSION: Satisfactory clip placement following ultrasound guided left breast biopsies.  Pathology will be followed.   Original Report Authenticated By: Harmon Pier, M.D.   Mm Digital Diagnostic Unilat L  01/09/2013   *RADIOLOGY REPORT*   Clinical Data:  47 year old female with newly diagnosed right breast cancer.  Left mammogram to compare to presurgical MRI.  DIGITAL DIAGNOSTIC LEFT MAMMOGRAM WITH CAD AND LEFT BREAST ULTRASOUND:  Comparison:  06/11/2012 mammogram and 01/09/2013 MRI  Findings:  ACR Breast Density Category 3: The breast tissue is heterogeneously dense.  A possible circumscribed mass in the upper outer left breast is identified. No suspicious calcifications or distortion identified.  Mammographic images were processed with CAD.  On physical exam, no palpable abnormalities identified within the upper left breast.  Ultrasound is performed, showing a 6 x 6 x 8 mm slightly irregular hypoechoic mass at the 11 o'clock position of the left breast 5 cm from the nipple An 11 x 7 x 9 mm slightly irregular hypoechoic mass at the 2 o'clock position of the left breast 4 cm from the nipple is also present. No enlarged or abnormal appearing left axillary lymph nodes are identified.  IMPRESSION: Indeterminate 6 x 6 x 8 mm hypoechoic mass at the 11 o'clock position of the left breast and 11 x 7 x 9 mm hypoechoic mass in the 2 o'clock position of the left breast.  The 11 o'clock position mass probably represents the enhancing nodule identified on today's MRI.  Tissue sampling of both masses is recommended.  BI-RADS CATEGORY 4:  Suspicious abnormality - biopsy should be considered.  RECOMMENDATION: Ultrasound guided biopsies of both left breast masses.  The findings and recommendations were discussed with the patient and she desires to proceed with ultrasound guided left breast biopsies. These biopsies will be performed today but dictated in a separate report.  Results were also provided in writing at the conclusion of the visit.  If applicable, a reminder letter will be sent to the patient regarding the next appointment.   Original Report Authenticated By: Harmon Pier, M.D.   [place] Lt Breast Bx W Loc Dev 1st Lesion Img Bx Spec US Guide  01/12/2013    *RADIOLOGY REPORT*  Clinical Data:  47 year old female with newly diagnosed right breast cancer.  Two left breast masses identified sonographically - 6 x 8 mm at the 11 o'clock position and 9 x 11 mm at the 2 o'clock position. For tissue sampling of the 6 x 8 mm mass at the 11 o'clock position.  ULTRASOUND GUIDED VACUUM ASSISTED CORE BIOPSY OF THE LEFT BREAST  Comparison: Previous exams.  I met with the patient and we discussed the procedure of ultrasound- guided biopsy, including benefits and alternatives.  We discussed the high likelihood of a successful procedure. We discussed the risks of the procedure including infection, bleeding, tissue injury, clip migration, and inadequate sampling.  Informed written consent was given.  Using sterile technique and 2% Lidocaine as local anesthetic, under direct ultrasound visualization, a12 gauge vacuum-assisteddevice was used to perform biopsy of the 6 x 8 mm hypoechoic mass at the 11 o'clock position of the left breast 5 cm from the nipple using a medial approach. At the conclusion of the  procedure, a ribbon shaped tissue marker clip was deployed into the biopsy cavity. Follow-up 2-view mammogram was performed and dictated separately.  The usual time-out protocol was performed immediately prior to the procedure.  IMPRESSION: Ultrasound-guided biopsy of 6 x 8 mm hypoechoic mass at the 11 o'clock position of the left breast.  No apparent complications.  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  Histology correlates with imaging findings.  The patient was contacted by phone on 01/12/2013 and these results given to her which she understood. Her questions were answered. The patient had no complaints with her biopsy site.  Recommend treatment plan.   Original Report Authenticated By: Harmon Pier, M.D.   [place] Lt Breast Bx W Loc Dev Ea Add Lesion Img Bx Spec US Guide  01/12/2013   *RADIOLOGY REPORT*  Clinical Data:   47 year old female with newly diagnosed right breast cancer.  Two left breast masses identified sonographically - 6 x 8 mm at the 11 o'clock position and 9 x 11 mm at the 2 o'clock position. For tissue sampling of the 9 x 11 mm mass at the 2 o'clock position.  ULTRASOUND GUIDED VACUUM ASSISTED CORE BIOPSY OF THE LEFT BREAST  Comparison: Previous exams.  I met with the patient and we discussed the procedure of ultrasound- guided biopsy, including benefits and alternatives.  We discussed the high likelihood of a successful procedure. We discussed the risks of the procedure including infection, bleeding, tissue injury, clip migration, and inadequate sampling.  Informed written consent was given.  Using sterile technique and 2% Lidocaine as local anesthetic, under direct ultrasound visualization, a 12 gauge vacuum-assisteddevice was used to perform biopsy of the 9 x 11 mm hypoechoic mass at the 2 o'clock position of the left breast 4 cm from the nipple using a medial approach. At the conclusion of the procedure, a  tissue marker clip was deployed into the biopsy cavity.  Follow-up 2-view mammogram was performed and dictated separately.  The usual time-out protocol was performed immediately prior to the procedure.  IMPRESSION: Ultrasound-guided biopsy of 9 x 11 mm mass in the 2 o'clock position of the left breast.  No apparent complications.  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 Histology correlates with imaging findings.  The patient was contacted by phone on 01/12/2013 and these results given to her which she understood. Her questions were answered. The patient had no complaints with her biopsy site.  Recommend treatment plan.   Original Report Authenticated By: Harmon Pier, M.D.    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 Patient is receiving neoadjuvant chemotherapy and her to therapy. She is receiving double HER-2 therapy with Herceptin and perjeta. She receives chemotherapy consisting of Taxotere and carboplatinum. All drugs are given every 3 weeks with day 2 Neulasta. She began her treatment on 01/16/2013. Thus far she is tolerating it well.  #2 patient is set up to see the genetic counselor in the next few weeks since she does have  unde breast cancer under the age of 67. We again today discussed the rationale for referring her to genetic counseling. 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  #3 left lower extremity edema:  Likely secondary to chemotherapy.  #4 nail bed changes: secondary to chemotherapy patient is reassured   PLAN:   #1 patient has now completed 6 cycles of her chemotherapy consisting of TCH/perjeta. Overall she tolerated it very well. She will proceed with her definitive surgery. Once she has had her surgery I will plan on seeing her back and we will proceed with Herceptin only adjuvantly.  #2 she has already been seen by Dr. Claud Kelp and will begin process of getting scheduled for definitive surgery. Patient is planning on having bilateral mastectomies.  All questions were answered. The patient knows to call the clinic with any problems, questions or concerns. We can certainly see the patient much sooner if necessary.  I spent 25 minutes counseling the patient face to face. The total time spent in the appointment was 30 minutes.    Drue Second, MD Medical/Oncology [facility] [phone] (beeper) [phone] (Office)

## 2013-05-27 ENCOUNTER — Ambulatory Visit (HOSPITAL_COMMUNITY)
Admission: RE | Admit: 2013-05-27 | Discharge: 2013-05-27 | Disposition: A | Discharge status: Home / Self Care | Payer: BC Managed Care – PPO | Source: Ambulatory Visit | Attending: Oncology | Admitting: Oncology

## 2013-05-27 ENCOUNTER — Ambulatory Visit (HOSPITAL_BASED_OUTPATIENT_CLINIC_OR_DEPARTMENT_OTHER)
Admission: RE | Admit: 2013-05-27 | Discharge: 2013-05-27 | Disposition: A | Discharge status: Home / Self Care | Payer: BC Managed Care – PPO | Source: Ambulatory Visit | Attending: Internal Medicine | Admitting: Internal Medicine

## 2013-05-27 VITALS — BP 118/58 | HR 106 | Wt 158.5 lb

## 2013-05-27 DIAGNOSIS — R600 Localized edema: Secondary | ICD-10-CM | POA: Insufficient documentation

## 2013-05-27 DIAGNOSIS — C50919 Malignant neoplasm of unspecified site of unspecified female breast: Secondary | ICD-10-CM | POA: Insufficient documentation

## 2013-05-27 DIAGNOSIS — C50911 Malignant neoplasm of unspecified site of right female breast: Secondary | ICD-10-CM

## 2013-05-27 DIAGNOSIS — R609 Edema, unspecified: Secondary | ICD-10-CM

## 2013-05-27 DIAGNOSIS — Z09 Encounter for follow-up examination after completed treatment for conditions other than malignant neoplasm: Secondary | ICD-10-CM

## 2013-05-27 MED ORDER — POTASSIUM CHLORIDE ER 20 MEQ PO TBCR: EXTENDED_RELEASE_TABLET | ORAL | Status: DC

## 2013-05-27 MED ORDER — FUROSEMIDE 20 MG PO TABS: ORAL_TABLET | ORAL | Status: DC

## 2013-05-27 NOTE — Progress Notes (Signed)
Oncologist: Dr Welton Flakes  General Surgeon: Dr Derrell Lolling   HPI:    Jasmine Schwartz is a 47 year old with PMH of DM I, R breast cancer ER/PR negative HER-2 positive invasive ductal carcinoma the Ki-67 was 76% with no known coronary disease. She is referred by Dr. Welton Flakes for enrollment into the cardio-oncology clinic.   Finished chemotherapy and is having bilateral mastectomies and then will have radiation. Herceptin started 01/2013 and will continue for 1 year.    ECHOs 01/13/13 EF 60% Lateral S' 11.2 05/27/13 EF 60%, lateral S' 11.5 Global strain - 20.7  Finished chemo and steroids 4 weeks ago. Reports more fatigue and progressive LE edema over past few weeks says it started after 4th treatment of Herceptin. +DOE. Denies orthopnea or CP. Weight is trending up - now 20 pounds up.   ROS: All systems negative except as listed in HPI, PMH and Problem List.  Past Medical History  Diagnosis Date  . Anxiety   . Depression   . Dyslipidemia   . Anxiety and depression 07/06/2011  . Breast cancer 01/01/13    /metastatic 1/1 lymph nodes  . Diabetes mellitus     IDDM  . Hx of insertion of insulin pump     Current Outpatient Prescriptions  Medication Sig Dispense Refill  . ALPRAZolam (XANAX) 0.25 MG tablet Take 0.25 mg by mouth 3 (three) times daily as needed for sleep or anxiety.      . Alum & Mag Hydroxide-Simeth (MAGIC MOUTHWASH W/LIDOCAINE) SOLN Take 5 mLs by mouth 4 (four) times daily as needed. Swish and spit.  120 mL  1  . dexamethasone (DECADRON) 4 MG tablet Take 2 tablets (8 mg total) by mouth 2 (two) times daily with a meal. Take two times a day the day before Taxotere. Then take two times a day starting the day after chemo for 3 days.  40 tablet  3  . fluconazole (DIFLUCAN) 200 MG tablet Take 1 tablet (200 mg total) by mouth daily.  5 tablet  5  . gabapentin (NEURONTIN) 100 MG capsule Take 1 capsule (100 mg total) by mouth 2 (two) times daily.  60 capsule  6  . glucose blood (NOVA MAX TEST) test strip Use  as directed up to 8 times a day      . Insulin Infusion Pump (MINIMED INSULIN PUMP) DEVI Inject 1 Units/hr into the vein continuous. Pt give extra per cards at meal times. Basic meals are about 5 units      . Insulin Infusion Pump Supplies (MINIMED INFUSION SET-MMT 397) MISC 1 Device by Does not apply route every 3 (three) days.      . Insulin Infusion Pump Supplies (PARADIGM PUMP RESERVOIR 1.76ML) MISC 1 Device by Does not apply route every 3 (three) days.      . insulin lispro (HUMALOG) 100 UNIT/ML injection Inject as directed into Insulin pump, total of 40 units/day  20 mL  2  . lidocaine-prilocaine (EMLA) cream Apply topically as needed. Apply to port-a-cath site 1-2 hours before treatment.  30 g  3  . LORazepam (ATIVAN) 0.5 MG tablet Take 1 tablet (0.5 mg total) by mouth every 6 (six) hours as needed (Nausea or vomiting).  30 tablet  2  . ondansetron (ZOFRAN) 8 MG tablet Take 8 mg by mouth 2 (two) times daily. Take two times a day starting the day after chemo for 3 days. Then take two times a day as needed for nausea or vomiting.      Marland Kitchen  prochlorperazine (COMPAZINE) 10 MG tablet Take 1 tablet (10 mg total) by mouth every 6 (six) hours as needed (Nausea or vomiting).  30 tablet  1  . prochlorperazine (COMPAZINE) 25 MG suppository Place 25 mg rectally every 12 (twelve) hours as needed for nausea.      . sucralfate (CARAFATE) 1 GM/10ML suspension Take 10 mLs (1 g total) by mouth 4 (four) times daily.  420 mL  6  . valACYclovir (VALTREX) 500 MG tablet Take 1 tablet (500 mg total) by mouth 2 (two) times daily.  30 tablet  4   No current facility-administered medications for this encounter.    Filed Vitals:   05/27/13 1455  BP: 118/58  Pulse: 106  Weight: 158 lb 8 oz (71.895 kg)  SpO2: 100%   PHYSICAL EXAM: General:  Well appearing. No resp difficulty HEENT: normal Neck: supple. JVP 6-7. Carotids 2+ bilaterally; no bruits. No lymphadenopathy or thryomegaly appreciated. Cor: PMI normal.  Irregular rate & rhythm. No rubs, gallops or murmurs. Lungs: clear Abdomen: soft, nontender, nondistended. No hepatosplenomegaly. No bruits or masses. Good bowel sounds. Extremities: no cyanosis, clubbing, rash, 2-3+  Edema bilaterally  Neuro: alert & orientedx3, cranial nerves grossly intact. Moves all 4 extremities w/o difficulty. Affect pleasant.  ASSESSMENT & PLAN:  1) Breast Cancer - ECHO reviewed today by Dr. Gala Romney and EF 60%, lateral s' 11.5, and global strain -20.7. These parameters are stable for the patient. - She does have increased LE edema and is up about 20 lbs since visit in June. Will start lasix 20 mg daily and potassium 20 meq daily x3 days. Instructed to take lasix until swelling and weight starts coming down and then she can take PRN. Discussed cutting back on fluids to less than 2L a day. - Elevate legs and can wear compression stockings.  F/U 3 months with ECHO  Aundria Rud NP-C 3:18 PM   Patient seen and examined with Ulla Potash, NP. We discussed all aspects of the encounter. I agree with the assessment and plan as stated above. Overall doing well. I reviewed echos personally. EF and Doppler parameters stable. Continue Herceptin. She has evidence of LE edema likely related to venous insufficiency and fluid retention. Will give lasix 20mg  daily (and kcl 20 meq daily) for 3 days and then can use prn. Encouraged her to get compression stockings and keep legs elevated.   Sarajean Dessert,MD 3:59 PM

## 2013-05-27 NOTE — Progress Notes (Signed)
  Echocardiogram 2D Echocardiogram has been performed.  Georgian Co 05/27/2013, 3:02 PM

## 2013-05-27 NOTE — Patient Instructions (Signed)
Take lasix 20 mg for 3 days and then as needed for weight gain and swelling.  Take potassium 20 meq for 3 days and then only if you need to take your lasix.  Call if weight not decreasing or symptoms get worse [phone]  Follow up 3 months with ECHO.  Good luck on your surgery.   Try to cut fluids back to less than 2L a day.

## 2013-05-28 ENCOUNTER — Encounter (HOSPITAL_COMMUNITY): Payer: Self-pay | Admitting: Pharmacy Technician

## 2013-05-29 ENCOUNTER — Ambulatory Visit (INDEPENDENT_AMBULATORY_CARE_PROVIDER_SITE_OTHER): Payer: BC Managed Care – PPO | Admitting: General Surgery

## 2013-05-29 ENCOUNTER — Encounter (HOSPITAL_COMMUNITY): Payer: Self-pay

## 2013-05-29 ENCOUNTER — Encounter (INDEPENDENT_AMBULATORY_CARE_PROVIDER_SITE_OTHER): Payer: Self-pay | Admitting: General Surgery

## 2013-05-29 ENCOUNTER — Encounter (HOSPITAL_COMMUNITY)
Admission: RE | Admit: 2013-05-29 | Discharge: 2013-05-29 | Disposition: A | Discharge status: Home / Self Care | Payer: BC Managed Care – PPO | Source: Ambulatory Visit | Attending: General Surgery | Admitting: General Surgery

## 2013-05-29 ENCOUNTER — Telehealth (HOSPITAL_COMMUNITY): Payer: Self-pay | Admitting: Cardiology

## 2013-05-29 ENCOUNTER — Telehealth (INDEPENDENT_AMBULATORY_CARE_PROVIDER_SITE_OTHER): Payer: Self-pay

## 2013-05-29 ENCOUNTER — Telehealth: Payer: Self-pay

## 2013-05-29 ENCOUNTER — Telehealth (HOSPITAL_COMMUNITY): Payer: Self-pay | Admitting: Anesthesiology

## 2013-05-29 VITALS — BP 126/74 | HR 68 | Temp 97.6°F | Resp 14 | Ht 63.0 in | Wt 159.4 lb

## 2013-05-29 DIAGNOSIS — C50911 Malignant neoplasm of unspecified site of right female breast: Secondary | ICD-10-CM

## 2013-05-29 DIAGNOSIS — E109 Type 1 diabetes mellitus without complications: Secondary | ICD-10-CM

## 2013-05-29 DIAGNOSIS — Z01810 Encounter for preprocedural cardiovascular examination: Secondary | ICD-10-CM | POA: Insufficient documentation

## 2013-05-29 DIAGNOSIS — Z01812 Encounter for preprocedural laboratory examination: Secondary | ICD-10-CM | POA: Insufficient documentation

## 2013-05-29 DIAGNOSIS — C50919 Malignant neoplasm of unspecified site of unspecified female breast: Secondary | ICD-10-CM

## 2013-05-29 DIAGNOSIS — Z01818 Encounter for other preprocedural examination: Secondary | ICD-10-CM | POA: Insufficient documentation

## 2013-05-29 HISTORY — DX: Type 1 diabetes mellitus without complications: E10.9

## 2013-05-29 HISTORY — DX: Other specified postprocedural states: R11.2

## 2013-05-29 HISTORY — DX: Venous insufficiency (chronic) (peripheral): I87.2

## 2013-05-29 HISTORY — DX: Other specified postprocedural states: Z98.890

## 2013-05-29 HISTORY — DX: Localized edema: R60.0

## 2013-05-29 LAB — URINALYSIS, ROUTINE W REFLEX MICROSCOPIC
Ketones, ur: NEGATIVE mg/dL
Leukocytes, UA: NEGATIVE
Nitrite: NEGATIVE
Protein, ur: NEGATIVE mg/dL
Urobilinogen, UA: 1 mg/dL (ref 0.0–1.0)

## 2013-05-29 LAB — COMPREHENSIVE METABOLIC PANEL
AST: 24 U/L (ref 0–37)
Alkaline Phosphatase: 47 U/L (ref 39–117)
BUN: 16 mg/dL (ref 6–23)
CO2: 25 mEq/L (ref 19–32)
Chloride: 99 mEq/L (ref 96–112)
Creatinine, Ser: 0.61 mg/dL (ref 0.50–1.10)
GFR calc Af Amer: >90 mL/min (ref >90)
GFR calc non Af Amer: >90 mL/min (ref >90)
Glucose, Bld: 246 mg/dL — ABNORMAL HIGH (ref 70–99)
Potassium: 4.1 mEq/L (ref 3.5–5.1)
Total Bilirubin: 0.3 mg/dL (ref 0.3–1.2)

## 2013-05-29 LAB — CBC WITH DIFFERENTIAL/PLATELET
Basophils Absolute: 0 10*3/uL (ref 0.0–0.1)
Basophils Relative: 0 % (ref 0–1)
HCT: 34.2 % — ABNORMAL LOW (ref 36.0–46.0)
Hemoglobin: 11.4 g/dL — ABNORMAL LOW (ref 12.0–15.0)
Lymphocytes Relative: 17 % (ref 12–46)
Lymphs Abs: 1.5 10*3/uL (ref 0.7–4.0)
MCHC: 33.3 g/dL (ref 30.0–36.0)
Monocytes Absolute: 0.8 10*3/uL (ref 0.1–1.0)
Monocytes Relative: 9 % (ref 3–12)
Neutro Abs: 6.7 10*3/uL (ref 1.7–7.7)
RBC: 3.56 MIL/uL — ABNORMAL LOW (ref 3.87–5.11)
RDW: 16.3 % — ABNORMAL HIGH (ref 11.5–15.5)

## 2013-05-29 LAB — HCG, SERUM, QUALITATIVE: Preg, Serum: NEGATIVE

## 2013-05-29 NOTE — Telephone Encounter (Signed)
Message copied by Ivory Broad on Fri May 29, 2013  1:12 PM ------      Message from: Ernestene Mention      Created: Fri May 29, 2013 11:23 AM       Pre op glucose >250.   Notify her PCP and request advice/intervention for improved control.\            hmi                  ----- Message -----         From: [facility] [place] Interface         Sent: 05/29/2013   9:19 AM           To: Ernestene Mention, MD                   ------

## 2013-05-29 NOTE — Pre-Procedure Instructions (Signed)
Ivery Michalski  05/29/2013   Your procedure is scheduled on:  October 28  Report to [facility] Entrance "A" [address] at [employer] AM.  Call this number if you have problems the morning of surgery: [phone]   Remember:   Do not eat food or drink liquids after midnight.   Take these medicines the morning of surgery with A SIP OF WATER: Gabapentin, Ativan (if needed)   Follow Endocrinologist instructions regarding insulin pump   Do not take Aspirin, Aleve, Naproxen, Advil, Ibuprofen, Vitamin, Herbs, or Supplements starting today   Do not wear jewelry, make-up or nail polish.  Do not wear lotions, powders, or perfumes. You may wear deodorant.  Do not shave 48 hours prior to surgery. Men may shave face and neck.  Do not bring valuables to the hospital.  [facility] is not responsible                  for any belongings or valuables.               Contacts, dentures or bridgework may not be worn into surgery.  Leave suitcase in the car. After surgery it may be brought to your room.  For patients admitted to the hospital, discharge time is determined by your                treatment team.               Special Instructions: Shower using CHG 2 nights before surgery and the night before surgery.  If you shower the day of surgery use CHG.  Use special wash - you have one bottle of CHG for all showers.  You should use approximately 1/3 of the bottle for each shower.   Please read over the following fact sheets that you were given: Pain Booklet, Coughing and Deep Breathing and Surgical Site Infection Prevention

## 2013-05-29 NOTE — Telephone Encounter (Signed)
PT WAS TOLD TO CALL OFFICE IF SWELLING DID NOT GO DOWN BY TODAY. PT STILL HAS EDEMA WEIGHT HAS ALSO WENT UP SOME TODAY WEIGHT IS 159.4 PT IS TAKING LASIX 20MG  AND KCl  PLEASE ADVISE

## 2013-05-29 NOTE — Progress Notes (Signed)
Patient ID: Jasmine Schwartz, female   DOB: July 07, 1966, 47 y.o.   MRN: [id number]  Chief Complaint  Patient presents with  . Breast Cancer Long Term Follow Up    HPI Jasmine Schwartz is a 47 y.o. female.  This patient returns for final  surgical evaluation . She has completed her neoadjuvant chemotherapy. She is scheduled for surgery on October 28.  This 47 year old female was initially referred to me by Dr. [name] for a very large discolored right breast mass, and she was initially seen on 12/31/2012. She had clinically positive lymph nodes. 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 There was metastatic cancer in the lymph nodes. The tumor was ER and PR negative but Her-2 positive. She had 2 small densities in the left breast on MRI. They have been biopsied and showed fibroadenomas. PET CT showed no systemic metastasis. Port-A-Cath has been inserted and she has now had several rounds of Herceptin-based chemotherapy, and her last preop treatment was on 05/01/13. She says there has been a dramatic improvement in the right breast.  She has decided she wants bilateral mastectomies, and delayed reconstruction. She requests delayed reconstruction, and request that she see Dr. Etter Sjogren that that time. She knows that she will most likely need radiation therapy on the left side.  She has an appointment with genetic counseling coming up.  MRI performed on September 5 shows that the right breast mass has essentially resolved radiographically. The surrounding satellite masses have also resolved resolved. Skin is much thinner. The right axillary lymph nodes are improved.  She has had some fluid retention, gaining as much as 15 pounds following her chemotherapy. She's had echocardiograms and has been given Lasix by Dr. Teena Dunk none, her cardiologist.  Preop lab work was performed this morning revealing a  blood sugar of 246, albumin of 2.7, creatinine 0.61, hemoglobin 11.4. Large glucosamine urine. She remains on her insulin pump.  She is ready to go ahead with her surgery and is anxious to get it completed. She knows that she will need further Herceptin chemotherapy postop.  HPI  Past Medical History  Diagnosis Date  . Anxiety   . Depression   . Dyslipidemia   . Anxiety and depression 07/06/2011  . Breast cancer 01/01/13    /metastatic 1/1 lymph nodes  . Diabetes mellitus     IDDM  . Hx of insertion of insulin pump   . PONV (postoperative nausea and vomiting)   . Type I diabetes mellitus     uses insulin pump  . Venous insufficiency   . Lower extremity edema     Past Surgical History  Procedure Laterality Date  . Lumbar spine surgery  2009    Dr Jillyn Hidden  . Cesarean section  1989  . Tubal ligation  1991  . Breast biopsy Right 01/01/13    Invasive mammary ca,metastatic in 1/1 lymph node  . Breast biopsy Left 01/09/13    Fibroadenoma,microcalcifications  . Portacath placement      Family History  Problem Relation Age of Onset  . Cancer Neg Hx   . Diabetes Neg Hx   . Coronary artery disease Mother     Social History History  Substance Use Topics  . Smoking status: Never Smoker   . Smokeless tobacco: Not on file  . Alcohol Use: No    No Known Allergies  Current Outpatient Prescriptions  Medication Sig Dispense Refill  .  CARAFATE 1 GM/10ML suspension       . cyanocobalamin 500 MCG tablet Take 500 mcg by mouth daily.      Marland Kitchen dexamethasone (DECADRON) 4 MG tablet       . furosemide (LASIX) 20 MG tablet Take 20 mg by mouth daily as needed for fluid.      Marland Kitchen gabapentin (NEURONTIN) 100 MG capsule Take 1 capsule (100 mg total) by mouth 2 (two) times daily.  60 capsule  6  . glucose blood (NOVA MAX TEST) test strip Use as directed up to 8 times a day      . Insulin Infusion Pump (MINIMED INSULIN PUMP) DEVI Inject 1 Units/hr into the vein continuous. Pt give extra per cards at  meal times. Basic meals are about 5 units      . Insulin Infusion Pump Supplies (MINIMED INFUSION SET-MMT 397) MISC 1 Device by Does not apply route every 3 (three) days.      . Insulin Infusion Pump Supplies (PARADIGM PUMP RESERVOIR 1.76ML) MISC 1 Device by Does not apply route every 3 (three) days.      . insulin lispro (HUMALOG) 100 UNIT/ML injection Inject as directed into Insulin pump, total of 40 units/day  20 mL  2  . LORazepam (ATIVAN) 0.5 MG tablet Take 0.5 mg by mouth every 8 (eight) hours as needed for anxiety.      . ondansetron (ZOFRAN) 8 MG tablet       . potassium chloride SA (K-DUR,KLOR-CON) 20 MEQ tablet Take 20 mEq by mouth daily as needed (when taking Furosemide).       No current facility-administered medications for this visit.    Review of Systems Review of Systems  Constitutional: Negative for fever, chills and unexpected weight change.  HENT: Negative for congestion, hearing loss, sore throat, trouble swallowing and voice change.   Eyes: Negative for visual disturbance.  Respiratory: Negative for cough and wheezing.   Cardiovascular: Negative for chest pain, palpitations and leg swelling.  Gastrointestinal: Negative for nausea, vomiting, abdominal pain, diarrhea, constipation, blood in stool, abdominal distention and anal bleeding.  Genitourinary: Negative for hematuria, vaginal bleeding and difficulty urinating.  Musculoskeletal: Positive for joint swelling. Negative for arthralgias.  Skin: Negative for rash and wound.  Neurological: Negative for seizures, syncope and headaches.  Hematological: Negative for adenopathy. Does not bruise/bleed easily.  Psychiatric/Behavioral: Negative for confusion.    Blood pressure 126/74, pulse 68, temperature 97.6 F (36.4 C), temperature source Temporal, resp. rate 14, height 5\' 3"  (1.6 m), weight 159 lb 6.4 oz (72.303 kg), last menstrual period 01/11/2013.  Physical Exam Physical Exam  Constitutional: She is oriented to  person, place, and time. She appears well-developed and well-nourished. No distress.  Edema of lower extremities is much greater than upper extremities.  HENT:  Head: Normocephalic and atraumatic.  Nose: Nose normal.  Mouth/Throat: No oropharyngeal exudate.  Eyes: Conjunctivae and EOM are normal. Pupils are equal, round, and reactive to light. Left eye exhibits no discharge. No scleral icterus.  Neck: Neck supple. No JVD present. No tracheal deviation present. No thyromegaly present.  Cardiovascular: Normal rate, regular rhythm, normal heart sounds and intact distal pulses.   No murmur heard. Pulmonary/Chest: Effort normal and breath sounds normal. No respiratory distress. She has no wheezes. She has no rales. She exhibits no tenderness.  Right breast revealed dramatic response. Normal skin. Some vague  thickening upper outer quadrant. Nonpalpable nodes. Left breast is symmetrical. No mass or skin change.  Abdominal: Soft. Bowel sounds  are normal. She exhibits no distension and no mass. There is no tenderness. There is no rebound and no guarding.  Insulin pump  Musculoskeletal: She exhibits no edema and no tenderness.  Lymphadenopathy:    She has no cervical adenopathy.  Neurological: She is alert and oriented to person, place, and time. She exhibits normal muscle tone. Coordination normal.  Skin: Skin is warm. No rash noted. She is not diaphoretic. No erythema. No pallor.  Psychiatric: She has a normal mood and affect. Her behavior is normal. Judgment and thought content normal.    Data Reviewed Notes from [facility]. All imaging and pathologic studies.  Assessment    Locally advanced right breast cancer, receptor-negative, HER-2 positive, pretreatment clinical stage T3, N1   Abnormal MRI left breast, status post biopsy at 11:00 and 2:00 position revealing benign fibroadenomas   Excellent clinical and radiographic response to neoadjuvant Herceptin-based chemotherapy.    Type 1 - Insulin dependent diabetes mellitus on insulin pump   Fluid retention, followed by cardiology  Anxiety and depression   Hyperlipidemia     Plan    Proceed with surgery on October 28, a right modified radical mastectomy and left simple mastectomy. We will leave the Port-A-Cath in  We will send another message to Dr. Everardo All, but I presume that we will convert her insulin pump to an insulin drip and target of blood sugar of 140-180 in the perioperative period. We will convert this back to an insulin pump at discharge Later on, she will be referred to Etter Sjogren . After recovery and adjuvant chemotherapy and adjuvant radiation therapy   Once again,  we talked about the indications, details, techniques, and risks of surgery, which we have done multiple times. All of her questions are answered. She understands all these issues. She agrees with this plan.  She will check with her cardiologist to see if further Lasix is indicated.       Angelia Mould. Derrell Lolling, M.D., [facility] General and Minimally invasive Surgery Breast and Colorectal Surgery Office:   [phone] Pager:   [phone]  05/29/2013, 2:57 PM

## 2013-05-29 NOTE — Telephone Encounter (Signed)
[place] Dec with Dr. Prec[facility], left voicemail pt is having surgery next week , pt't pre-op cbg was 250, please advise [phone].

## 2013-05-29 NOTE — Telephone Encounter (Signed)
Patient called about continued weight gain and not having much UOP with lasix. Have asked her to double up on her lasix 40 mg daily until Monday along with 40 meq potassium. Told to call back on Monday and let us know if her weight is coming down.

## 2013-05-29 NOTE — Telephone Encounter (Signed)
Left a message to be given to Dr Everardo All regarding patient's preop blood glucose and advice to be given.  Await call back.  Surgery is 06/09/2013

## 2013-05-29 NOTE — Telephone Encounter (Signed)
Ok, i sent staff message to dr Derrell Lolling, about insulin during the perioperative period.

## 2013-05-29 NOTE — Progress Notes (Signed)
Patient Endocrinologist is Dr. Everardo All notified DM coordinator Boneta Lucks of anticipated surgery. Patient will make contact with Dr. Everardo All and ask if any adjustments need to be made with basal rate. Patient notified that insulin pump will probably be removed DOS and switched to insulin drip while in surgery.

## 2013-05-29 NOTE — Telephone Encounter (Signed)
[place] advised dr. Adair Patter sent staff message

## 2013-05-29 NOTE — Patient Instructions (Signed)
You seem to be doing well. Other than the fluid retention.  Be sure to contact your cardiologist to see if they can more aggressively get the fluid off your body prior to surgery  Surgery is scheduled on October 28. We plan a right modified radical mastectomy and a left simple mastectomy. The Port-A-Cath will be left in place.  Unless I hear from Dr. Everardo All, we will plan to convert you to an intravenous insulin drip preop and continued that postop.  We will target a blood sugar of 140-180.       Total or Modified Radical Mastectomy  Care After Refer to this sheet in the next few weeks. These instructions provide you with information on caring for yourself after your procedure. Your caregiver may also give you more specific instructions. Your treatment has been planned according to current medical practices, but problems sometimes occur. Call your caregiver if you have any problems or questions after your procedure. ACTIVITY  Your caregiver will advise you when you may resume strenuous activities, driving, and sports.  After the drain(s) are removed, you may do light housework. Avoid heavy lifting, carrying, or pushing. You should not be lifting anything heavier than 5 lbs.  Take frequent rest periods. You may tire more easily than usual.  Always rest and elevate the arm affected by your surgery for a period of time equal to your activity time.  Continue doing the exercises given to you by the physical therapist/occupational therapist even after full range of motion has returned. The amount of time this takes will vary from person to person.  After normal range of motion has returned, some stiffness and soreness may persist for 2-3 months. This is normal and will subside.  Begin sports or strenuous activities in moderation. This will give you a chance to rebuild your endurance. Continue to be cautious of heavy lifting or carrying (no more than 10 lbs.) with your affected arm.  You may  return to work as recommended by your caregiver. NUTRITION  You may resume your normal diet.  Make sure you drink plenty of fluids (6-8 glasses a day).  Eat a well-balanced diet. Including daily portions of food from government recommended food groups:  Grains.  Vegetables.  Fruits.  Milk.  Meat & beans.  Oils. Visit [employer] for more information HYGIENE  You may wash your hair.  If your incision (cut from surgery) is closed, you may shower or tub bathe, unless instructed otherwise by your doctor. FEVER  If you feel feverish or have shaking chills, take your temperature. If your temperature is 102 F (38.9 C) or above, call your caregiver. The fever may mean there is an infection.  If you call early, infection can be treated with antibiotics and hospitalization may be avoided. PAIN CONTROL  Mild discomfort may occur.  You may need to take an over-the-counter pain medication or a medication prescribed by your caregiver.  Call your caregiver if you experience increased pain. INCISION CARE  Check your incision daily for increased redness, drainage, swelling, or separation of skin.  Call your caregiver if any of the above are noted. ARM AND HAND CARE  If the lymph nodes under your arm were removed with a modified radical mastectomy, there may be a greater tendency for the arm to swell.  Try to avoid having blood pressures taken, blood drawn, or injections given in the affected arm. This is the arm on the same side as the surgery.  Use hand lotion to soften  cuticles instead of cutting them to avoid cutting yourself.  Be careful when shaving your under arms. Use an electric shaver if possible. You may use a deodorant after the incision has completely healed. Until then, clean under your arms with hydrogen peroxide.  Use reasonable precaution when cooking, sewing, and gardening to avoid burning or needle or thorn pricks.  Do not weigh your arm straight  down with a package or your purse.  Follow the exercises and instructions given to you by the physical therapist/occupational therapist and your caregiver. FOLLOW-UP APPOINTMENT Call your caregiver for a follow-up appointment as directed. PROSTHESIS INFORMATION Wear your temporary prosthesis (artificial breast) until your caregiver gives you permission to purchase a permanent one. This will depend upon your rate of healing. We suggest you also wait until you are physically and emotionally ready to shop for one. The suitability depends on several individual factors. We do not endorse any particular prosthesis, but suggest you try several until you are satisfied with appearance and fit. A list of stores may be obtained from your local [employer] at www.cancer.org or 1-800-ACS-2345 (1-(530)293-4204). A permanent prosthesis is medically necessary to restore balance. It is also income tax deductible. Be sure all receipts are marked "surgical". It is not essential to purchase a bra. You may sew a pocket into your regular bra. Note: Remember to take all of your medical insurance information with you when shopping for your prosthesis. SELECTING A PROSTHESIS FITTER You may want to ask the following questions when selecting a fitter:  What styles and brands of forms are carried in stock?  How long have the forms been on the market and have there been any problems with them?  Why would one form be better than another?  How long should a particular form last?  May I wear the form for a trial period without obligation?  Do the forms require a prosthetic bra? If so, what is the price range? Must I always wear that style?  If alterations to the bra are necessary, can they be done at this location or be sent out?  Will I be charged for alterations?  Will I receive suggestions on how to alter my own wardrobe, if necessary?  Will you special order forms or bras if necessary?  Are fitters  always available to meet my needs?  What kinds of garments should be worn for the fitting?  Are lounge wear, swim wear, and accessories available?  If I have insurance coverage or Medicare, will you suggest ways for processing the paper work?  Do you keep complete records so that mail reordering is possible?  How are warranty claims handled if I have a problem with the form? Document Released: 03/22/2004 Document Revised: 10/22/2011 Document Reviewed: 11/25/2007 [facility] Patient Information 2014 [place], [place].

## 2013-06-08 MED ORDER — CHLORHEXIDINE GLUCONATE 4 % EX LIQD: 1.0000 "application " | Freq: Once | CUTANEOUS | Status: DC

## 2013-06-08 MED ORDER — CEFAZOLIN SODIUM-DEXTROSE 2-3 GM-% IV SOLR
2.0000 g | INTRAVENOUS | Status: AC
  Administered 2013-06-09: 2 g via INTRAVENOUS
  Filled 2013-06-08: qty 50

## 2013-06-08 NOTE — H&P (Signed)
Jasmine Schwartz   MRN:  [id number]   Description: 47 year old female  Provider: Ernestene Mention, MD  Department: [facility]        Diagnoses    Breast cancer, right    -  Primary    174.9    Type I (juvenile type) diabetes mellitus without mention of complication, not stated as uncontrolled        250.01      Reason for Visit    Breast Cancer Long Term Follow Up        Current Vitals - Last Recorded    BP Pulse Temp(Src) Resp Ht Wt    126/74 68 97.6 F (36.4 C) (Temporal) 14 5\' 3"  (1.6 m) 159 lb 6.4 oz (72.303 kg)       BMI 28.24 kg/m2 01/11/2013               History and Physical   Ernestene Mention, MD    Status: Signed                        HPI Jasmine Schwartz is a 47 y.o. female.  This patient returns for final  surgical evaluation . She has completed her neoadjuvant chemotherapy. She is scheduled for surgery on October 28.   This 47 year old female was initially referred to me by Dr. [name] for a very large discolored right breast mass, and she was initially seen on 12/31/2012. She had clinically positive lymph nodes. 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 There was metastatic cancer in the lymph nodes. The tumor was ER and PR negative but Her-2 positive. She had 2 small densities in the left breast on MRI. They have been biopsied and showed fibroadenomas. PET CT showed no systemic metastasis. Port-A-Cath has been inserted and she has now had several rounds of Herceptin-based chemotherapy, and her last preop treatment was on 05/01/13. She says there has been a dramatic improvement in the right breast.   She has decided she wants bilateral mastectomies, and delayed reconstruction. She requests delayed reconstruction, and request that she see Dr. Etter Sjogren that that time. She knows that she will most likely need radiation therapy on the left  side.   She has an appointment with genetic counseling coming up.   MRI performed on September 5 shows that the right breast mass has essentially resolved radiographically. The surrounding satellite masses have also resolved resolved. Skin is much thinner. The right axillary lymph nodes are improved.   She has had some fluid retention, gaining as much as 15 pounds following her chemotherapy. She's had echocardiograms and has been given Lasix by Dr. Teena Dunk none, her cardiologist.   Preop lab work was performed this morning revealing a blood sugar of 246, albumin of 2.7, creatinine 0.61, hemoglobin 11.4. Large glucosamine urine. She remains on her insulin pump.   She is ready to go ahead with her surgery and is anxious to get it completed. She knows that she will need further Herceptin chemotherapy postop.       Past Medical History   Diagnosis  Date   .  Anxiety     .  Depression     .  Dyslipidemia     .  Anxiety and depression  07/06/2011   .  Breast cancer  01/01/13       /metastatic 1/1  lymph nodes   .  Diabetes mellitus         IDDM   .  Hx of insertion of insulin pump     .  PONV (postoperative nausea and vomiting)     .  Type I diabetes mellitus         uses insulin pump   .  Venous insufficiency     .  Lower extremity edema           Past Surgical History   Procedure  Laterality  Date   .  Lumbar spine surgery    2009       Dr Jillyn Hidden   .  Cesarean section    1989   .  Tubal ligation    1991   .  Breast biopsy  Right  01/01/13       Invasive mammary ca,metastatic in 1/1 lymph node   .  Breast biopsy  Left  01/09/13       Fibroadenoma,microcalcifications   .  Portacath placement             Family History   Problem  Relation  Age of Onset   .  Cancer  Neg Hx     .  Diabetes  Neg Hx     .  Coronary artery disease  Mother          Social History History   Substance Use Topics   .  Smoking status:  Never Smoker    .  Smokeless tobacco:  Not on file   .   Alcohol Use:  No        No Known Allergies    Current Outpatient Prescriptions   Medication  Sig  Dispense  Refill   .  CARAFATE 1 GM/10ML suspension           .  cyanocobalamin 500 MCG tablet  Take 500 mcg by mouth daily.         Marland Kitchen  dexamethasone (DECADRON) 4 MG tablet           .  furosemide (LASIX) 20 MG tablet  Take 20 mg by mouth daily as needed for fluid.         Marland Kitchen  gabapentin (NEURONTIN) 100 MG capsule  Take 1 capsule (100 mg total) by mouth 2 (two) times daily.   60 capsule   6   .  glucose blood (NOVA MAX TEST) test strip  Use as directed up to 8 times a day         .  Insulin Infusion Pump (MINIMED INSULIN PUMP) DEVI  Inject 1 Units/hr into the vein continuous. Pt give extra per cards at meal times. Basic meals are about 5 units         .  Insulin Infusion Pump Supplies (MINIMED INFUSION SET-MMT 397) MISC  1 Device by Does not apply route every 3 (three) days.         .  Insulin Infusion Pump Supplies (PARADIGM PUMP RESERVOIR 1.76ML) MISC  1 Device by Does not apply route every 3 (three) days.         .  insulin lispro (HUMALOG) 100 UNIT/ML injection  Inject as directed into Insulin pump, total of 40 units/day   20 mL   2   .  LORazepam (ATIVAN) 0.5 MG tablet  Take 0.5 mg by mouth every 8 (eight) hours as needed for anxiety.         Marland Kitchen  ondansetron (ZOFRAN) 8 MG tablet           .  potassium chloride SA (K-DUR,KLOR-CON) 20 MEQ tablet  Take 20 mEq by mouth daily as needed (when taking Furosemide).             No current facility-administered medications for this visit.        Review of Systems   Constitutional: Negative for fever, chills and unexpected weight change.  HENT: Negative for congestion, hearing loss, sore throat, trouble swallowing and voice change.   Eyes: Negative for visual disturbance.  Respiratory: Negative for cough and wheezing.   Cardiovascular: Negative for chest pain, palpitations and leg swelling.  Gastrointestinal: Negative for nausea, vomiting,  abdominal pain, diarrhea, constipation, blood in stool, abdominal distention and anal bleeding.  Genitourinary: Negative for hematuria, vaginal bleeding and difficulty urinating.  Musculoskeletal: Positive for joint swelling. Negative for arthralgias.  Skin: Negative for rash and wound.  Neurological: Negative for seizures, syncope and headaches.  Hematological: Negative for adenopathy. Does not bruise/bleed easily.  Psychiatric/Behavioral: Negative for confusion.      Blood pressure 126/74, pulse 68, temperature 97.6 F (36.4 C), temperature source Temporal, resp. rate 14, height 5\' 3"  (1.6 m), weight 159 lb 6.4 oz (72.303 kg), last menstrual period 01/11/2013.   Physical Exam   Constitutional: She is oriented to person, place, and time. She appears well-developed and well-nourished. No distress.  Edema of lower extremities is much greater than upper extremities.  HENT:   Head: Normocephalic and atraumatic.   Nose: Nose normal.   Mouth/Throat: No oropharyngeal exudate.  Eyes: Conjunctivae and EOM are normal. Pupils are equal, round, and reactive to light. Left eye exhibits no discharge. No scleral icterus.  Neck: Neck supple. No JVD present. No tracheal deviation present. No thyromegaly present.  Cardiovascular: Normal rate, regular rhythm, normal heart sounds and intact distal pulses.    No murmur heard. Pulmonary/Chest: Effort normal and breath sounds normal. No respiratory distress. She has no wheezes. She has no rales. She exhibits no tenderness.  Right breast revealed dramatic response. Normal skin. Some vague  thickening upper outer quadrant. Nonpalpable nodes. Left breast is symmetrical. No mass or skin change.  Abdominal: Soft. Bowel sounds are normal. She exhibits no distension and no mass. There is no tenderness. There is no rebound and no guarding.  Insulin pump  Musculoskeletal: She exhibits no edema and no tenderness.  Lymphadenopathy:    She has no cervical adenopathy.   Neurological: She is alert and oriented to person, place, and time. She exhibits normal muscle tone. Coordination normal.  Skin: Skin is warm. No rash noted. She is not diaphoretic. No erythema. No pallor.  Psychiatric: She has a normal mood and affect. Her behavior is normal. Judgment and thought content normal.      Data Reviewed Notes from [facility]. All imaging and pathologic studies.   Assessment     Locally advanced right breast cancer, receptor-negative, HER-2 positive, pretreatment clinical stage T3, N1    Abnormal MRI left breast, status post biopsy at 11:00 and 2:00 position revealing benign fibroadenomas    Excellent clinical and radiographic response to neoadjuvant Herceptin-based chemotherapy.    Type 1 - Insulin dependent diabetes mellitus on insulin pump    Fluid retention, followed by cardiology   Anxiety and depression    Hyperlipidemia       Plan    Proceed with surgery on October 28, a right modified radical mastectomy and left simple mastectomy. We  will leave the Port-A-Cath in   We will send another message to Dr. Everardo All, but I presume that we will convert her insulin pump to an insulin drip and target of blood sugar of 140-180 in the perioperative period. We will convert this back to an insulin pump at discharge Later on, she will be referred to Etter Sjogren . After recovery and adjuvant chemotherapy and adjuvant radiation therapy    Once again,  we talked about the indications, details, techniques, and risks of surgery, which we have done multiple times. All of her questions are answered. She understands all these issues. She agrees with this plan.   She will check with her cardiologist to see if further Lasix is indicated.          Angelia Mould. Derrell Lolling, M.D., [facility] General and Minimally invasive Surgery Breast and Colorectal Surgery Office:   [phone] Pager:   [phone]

## 2013-06-09 ENCOUNTER — Encounter (HOSPITAL_COMMUNITY)
Admission: RE | Disposition: A | Discharge status: Home / Self Care | Payer: Self-pay | Source: Ambulatory Visit | Attending: General Surgery

## 2013-06-09 ENCOUNTER — Ambulatory Visit (HOSPITAL_COMMUNITY): Payer: BC Managed Care – PPO | Admitting: Anesthesiology

## 2013-06-09 ENCOUNTER — Encounter (HOSPITAL_COMMUNITY): Payer: BC Managed Care – PPO | Admitting: Anesthesiology

## 2013-06-09 ENCOUNTER — Encounter (HOSPITAL_COMMUNITY): Payer: Self-pay | Admitting: *Deleted

## 2013-06-09 ENCOUNTER — Observation Stay (HOSPITAL_COMMUNITY)
Admission: RE | Admit: 2013-06-09 | Discharge: 2013-06-11 | Disposition: A | Discharge status: Home / Self Care | Payer: BC Managed Care – PPO | Source: Ambulatory Visit | Attending: General Surgery | Admitting: General Surgery

## 2013-06-09 DIAGNOSIS — C50911 Malignant neoplasm of unspecified site of right female breast: Secondary | ICD-10-CM | POA: Diagnosis present

## 2013-06-09 DIAGNOSIS — Z171 Estrogen receptor negative status [ER-]: Secondary | ICD-10-CM | POA: Insufficient documentation

## 2013-06-09 DIAGNOSIS — C50919 Malignant neoplasm of unspecified site of unspecified female breast: Secondary | ICD-10-CM

## 2013-06-09 DIAGNOSIS — N6019 Diffuse cystic mastopathy of unspecified breast: Secondary | ICD-10-CM

## 2013-06-09 DIAGNOSIS — R920 Mammographic microcalcification found on diagnostic imaging of breast: Secondary | ICD-10-CM

## 2013-06-09 DIAGNOSIS — C773 Secondary and unspecified malignant neoplasm of axilla and upper limb lymph nodes: Secondary | ICD-10-CM | POA: Insufficient documentation

## 2013-06-09 DIAGNOSIS — N6029 Fibroadenosis of unspecified breast: Secondary | ICD-10-CM | POA: Insufficient documentation

## 2013-06-09 DIAGNOSIS — Z794 Long term (current) use of insulin: Secondary | ICD-10-CM | POA: Insufficient documentation

## 2013-06-09 DIAGNOSIS — Z853 Personal history of malignant neoplasm of breast: Secondary | ICD-10-CM

## 2013-06-09 DIAGNOSIS — E109 Type 1 diabetes mellitus without complications: Secondary | ICD-10-CM | POA: Diagnosis present

## 2013-06-09 DIAGNOSIS — Z9641 Presence of insulin pump (external) (internal): Secondary | ICD-10-CM | POA: Insufficient documentation

## 2013-06-09 HISTORY — PX: MASTECTOMY MODIFIED RADICAL: SHX5962

## 2013-06-09 HISTORY — PX: MASTECTOMY COMPLETE / SIMPLE: SUR845

## 2013-06-09 HISTORY — PX: SIMPLE MASTECTOMY WITH AXILLARY SENTINEL NODE BIOPSY: SHX6098

## 2013-06-09 HISTORY — PX: MASTECTOMY MODIFIED RADICAL: SUR848

## 2013-06-09 LAB — POCT I-STAT GLUCOSE: Glucose, Bld: 142 mg/dL — ABNORMAL HIGH (ref 70–99)

## 2013-06-09 LAB — CBC
HCT: 30.8 % — ABNORMAL LOW (ref 36.0–46.0)
Hemoglobin: 10.6 g/dL — ABNORMAL LOW (ref 12.0–15.0)
MCH: 32.1 pg (ref 26.0–34.0)
MCHC: 34.4 g/dL (ref 30.0–36.0)
MCV: 93.3 fL (ref 78.0–100.0)
Platelets: 286 10*3/uL (ref 150–400)
RBC: 3.3 MIL/uL — ABNORMAL LOW (ref 3.87–5.11)

## 2013-06-09 LAB — GLUCOSE, CAPILLARY
Glucose-Capillary: 158 mg/dL — ABNORMAL HIGH (ref 70–99)
Glucose-Capillary: 119 mg/dL — ABNORMAL HIGH (ref 70–99)
Glucose-Capillary: 170 mg/dL — ABNORMAL HIGH (ref 70–99)
Glucose-Capillary: 207 mg/dL — ABNORMAL HIGH (ref 70–99)
Glucose-Capillary: 250 mg/dL — ABNORMAL HIGH (ref 70–99)
Glucose-Capillary: 148 mg/dL — ABNORMAL HIGH (ref 70–99)

## 2013-06-09 LAB — CREATININE, SERUM: GFR calc Af Amer: >90 mL/min (ref >90)

## 2013-06-09 SURGERY — MASTECTOMY, MODIFIED RADICAL
Anesthesia: General | Site: Chest | Laterality: Right | Wound class: Clean

## 2013-06-09 MED ORDER — ROCURONIUM BROMIDE 100 MG/10ML IV SOLN
INTRAVENOUS | Status: DC | PRN
  Administered 2013-06-09: 10 mg via INTRAVENOUS
  Administered 2013-06-09: 20 mg via INTRAVENOUS
  Administered 2013-06-09: 50 mg via INTRAVENOUS

## 2013-06-09 MED ORDER — HYDROMORPHONE HCL PF 1 MG/ML IJ SOLN
0.2500 mg | INTRAMUSCULAR | Status: DC | PRN
  Administered 2013-06-09 (×4): 0.5 mg via INTRAVENOUS

## 2013-06-09 MED ORDER — KCL IN DEXTROSE-NACL 20-5-0.45 MEQ/L-%-% IV SOLN
INTRAVENOUS | Status: DC
  Administered 2013-06-09: 14:00:00 via INTRAVENOUS
  Filled 2013-06-09 (×3): qty 1000

## 2013-06-09 MED ORDER — LACTATED RINGERS IV SOLN
INTRAVENOUS | Status: DC | PRN
  Administered 2013-06-09 (×3): via INTRAVENOUS

## 2013-06-09 MED ORDER — ONDANSETRON HCL 4 MG PO TABS: 4.0000 mg | ORAL_TABLET | Freq: Four times a day (QID) | ORAL | Status: DC | PRN

## 2013-06-09 MED ORDER — INSULIN REGULAR HUMAN 100 UNIT/ML IJ SOLN
INTRAMUSCULAR | Status: DC
  Administered 2013-06-09: 3.4 [IU]/h via INTRAVENOUS
  Filled 2013-06-09: qty 1

## 2013-06-09 MED ORDER — INSULIN PUMP
Freq: Three times a day (TID) | SUBCUTANEOUS | Status: DC
  Administered 2013-06-09 – 2013-06-11 (×2): via SUBCUTANEOUS
  Filled 2013-06-09: qty 1

## 2013-06-09 MED ORDER — 0.9 % SODIUM CHLORIDE (POUR BTL) OPTIME
TOPICAL | Status: DC | PRN
  Administered 2013-06-09 (×2): 1000 mL

## 2013-06-09 MED ORDER — LORAZEPAM 0.5 MG PO TABS: 0.5000 mg | ORAL_TABLET | Freq: Three times a day (TID) | ORAL | Status: DC | PRN

## 2013-06-09 MED ORDER — GABAPENTIN 100 MG PO CAPS
100.0000 mg | ORAL_CAPSULE | Freq: Two times a day (BID) | ORAL | Status: DC
  Administered 2013-06-09 – 2013-06-10 (×3): 100 mg via ORAL
  Filled 2013-06-09 (×7): qty 1

## 2013-06-09 MED ORDER — CEFAZOLIN SODIUM-DEXTROSE 2-3 GM-% IV SOLR
2.0000 g | Freq: Three times a day (TID) | INTRAVENOUS | Status: AC
  Administered 2013-06-09 – 2013-06-10 (×3): 2 g via INTRAVENOUS
  Filled 2013-06-09 (×3): qty 50

## 2013-06-09 MED ORDER — SCOPOLAMINE 1 MG/3DAYS TD PT72
MEDICATED_PATCH | TRANSDERMAL | Status: DC | PRN
  Administered 2013-06-09: 1 via TRANSDERMAL

## 2013-06-09 MED ORDER — HYDROMORPHONE HCL PF 1 MG/ML IJ SOLN
INTRAMUSCULAR | Status: AC
  Administered 2013-06-09: 0.5 mg via INTRAVENOUS
  Filled 2013-06-09: qty 1

## 2013-06-09 MED ORDER — PROPOFOL 10 MG/ML IV BOLUS
INTRAVENOUS | Status: DC | PRN
  Administered 2013-06-09: 150 mg via INTRAVENOUS

## 2013-06-09 MED ORDER — MIDAZOLAM HCL 5 MG/5ML IJ SOLN
INTRAMUSCULAR | Status: DC | PRN
  Administered 2013-06-09: 2 mg via INTRAVENOUS

## 2013-06-09 MED ORDER — PROMETHAZINE HCL 25 MG/ML IJ SOLN
6.2500 mg | INTRAMUSCULAR | Status: DC | PRN
  Administered 2013-06-09: 6.25 mg via INTRAVENOUS

## 2013-06-09 MED ORDER — ALBUMIN HUMAN 5 % IV SOLN
INTRAVENOUS | Status: DC | PRN
  Administered 2013-06-09 (×2): via INTRAVENOUS

## 2013-06-09 MED ORDER — SODIUM CHLORIDE 0.9 % IV SOLN
1.0000 [IU]/h | INTRAVENOUS | Status: AC
  Administered 2013-06-09: 2.9 [IU]/h via INTRAVENOUS
  Filled 2013-06-09 (×2): qty 1

## 2013-06-09 MED ORDER — OXYCODONE-ACETAMINOPHEN 5-325 MG PO TABS
1.0000 | ORAL_TABLET | ORAL | Status: DC | PRN
  Administered 2013-06-09 – 2013-06-11 (×8): 2 via ORAL
  Filled 2013-06-09 (×8): qty 2

## 2013-06-09 MED ORDER — ONDANSETRON HCL 4 MG/2ML IJ SOLN
INTRAMUSCULAR | Status: DC | PRN
  Administered 2013-06-09 (×2): 4 mg via INTRAVENOUS

## 2013-06-09 MED ORDER — PROMETHAZINE HCL 25 MG/ML IJ SOLN
INTRAMUSCULAR | Status: AC
  Filled 2013-06-09: qty 1

## 2013-06-09 MED ORDER — SODIUM CHLORIDE 0.9 % IV SOLN
INTRAVENOUS | Status: DC
  Administered 2013-06-09 – 2013-06-10 (×2): via INTRAVENOUS

## 2013-06-09 MED ORDER — NEOSTIGMINE METHYLSULFATE 1 MG/ML IJ SOLN
INTRAMUSCULAR | Status: DC | PRN
  Administered 2013-06-09: 4 mg via INTRAVENOUS

## 2013-06-09 MED ORDER — OXYCODONE HCL 5 MG PO TABS: 5.0000 mg | ORAL_TABLET | Freq: Once | ORAL | Status: DC | PRN

## 2013-06-09 MED ORDER — ONDANSETRON HCL 4 MG/2ML IJ SOLN: 4.0000 mg | Freq: Four times a day (QID) | INTRAMUSCULAR | Status: DC | PRN

## 2013-06-09 MED ORDER — FUROSEMIDE 20 MG PO TABS: 20.0000 mg | ORAL_TABLET | Freq: Every day | ORAL | Status: DC | PRN

## 2013-06-09 MED ORDER — SUCRALFATE 1 GM/10ML PO SUSP
1.0000 g | Freq: Two times a day (BID) | ORAL | Status: DC
  Filled 2013-06-09 (×6): qty 10

## 2013-06-09 MED ORDER — LIDOCAINE HCL (CARDIAC) 20 MG/ML IV SOLN
INTRAVENOUS | Status: DC | PRN
  Administered 2013-06-09: 100 mg via INTRAVENOUS

## 2013-06-09 MED ORDER — SCOPOLAMINE 1 MG/3DAYS TD PT72: 1.0000 | MEDICATED_PATCH | TRANSDERMAL | Status: DC

## 2013-06-09 MED ORDER — FENTANYL CITRATE 0.05 MG/ML IJ SOLN
25.0000 ug | INTRAMUSCULAR | Status: DC | PRN
  Administered 2013-06-09: 25 ug via INTRAVENOUS
  Filled 2013-06-09: qty 2

## 2013-06-09 MED ORDER — HYDROMORPHONE HCL PF 1 MG/ML IJ SOLN
INTRAMUSCULAR | Status: AC
  Filled 2013-06-09: qty 1

## 2013-06-09 MED ORDER — ARTIFICIAL TEARS OP OINT
TOPICAL_OINTMENT | OPHTHALMIC | Status: DC | PRN
  Administered 2013-06-09: 1 via OPHTHALMIC

## 2013-06-09 MED ORDER — OXYCODONE HCL 5 MG/5ML PO SOLN: 5.0000 mg | Freq: Once | ORAL | Status: DC | PRN

## 2013-06-09 MED ORDER — GLYCOPYRROLATE 0.2 MG/ML IJ SOLN
INTRAMUSCULAR | Status: DC | PRN
  Administered 2013-06-09: .7 mg via INTRAVENOUS

## 2013-06-09 MED ORDER — FENTANYL CITRATE 0.05 MG/ML IJ SOLN
INTRAMUSCULAR | Status: DC | PRN
  Administered 2013-06-09 (×2): 50 ug via INTRAVENOUS
  Administered 2013-06-09: 100 ug via INTRAVENOUS
  Administered 2013-06-09 (×3): 50 ug via INTRAVENOUS
  Administered 2013-06-09: 100 ug via INTRAVENOUS

## 2013-06-09 MED ORDER — POTASSIUM CHLORIDE CRYS ER 20 MEQ PO TBCR: 20.0000 meq | EXTENDED_RELEASE_TABLET | Freq: Every day | ORAL | Status: DC | PRN

## 2013-06-09 MED ORDER — ENOXAPARIN SODIUM 40 MG/0.4ML ~~LOC~~ SOLN
40.0000 mg | SUBCUTANEOUS | Status: DC
  Administered 2013-06-10 – 2013-06-11 (×2): 40 mg via SUBCUTANEOUS
  Filled 2013-06-09 (×4): qty 0.4

## 2013-06-09 SURGICAL SUPPLY — 55 items
ADH SKN CLS APL DERMABOND .7 (GAUZE/BANDAGES/DRESSINGS) ×4
APL SKNCLS STERI-STRIP NONHPOA (GAUZE/BANDAGES/DRESSINGS) ×2
APPLIER CLIP 9.375 MED OPEN (MISCELLANEOUS) ×6
APR CLP MED 9.3 20 MLT OPN (MISCELLANEOUS) ×4
BENZOIN TINCTURE PRP APPL 2/3 (GAUZE/BANDAGES/DRESSINGS) ×3 IMPLANT
BINDER BREAST LRG (GAUZE/BANDAGES/DRESSINGS) IMPLANT
BINDER BREAST XLRG (GAUZE/BANDAGES/DRESSINGS) ×2 IMPLANT
CANISTER SUCTION 2500CC (MISCELLANEOUS) ×3 IMPLANT
CHLORAPREP W/TINT 26ML (MISCELLANEOUS) ×4 IMPLANT
CLIP APPLIE 9.375 MED OPEN (MISCELLANEOUS) ×2 IMPLANT
CONT SPEC 4OZ CLIKSEAL STRL BL (MISCELLANEOUS) IMPLANT
COVER SURGICAL LIGHT HANDLE (MISCELLANEOUS) ×3 IMPLANT
DERMABOND ADVANCED (GAUZE/BANDAGES/DRESSINGS) ×2
DERMABOND ADVANCED .7 DNX12 (GAUZE/BANDAGES/DRESSINGS) ×2 IMPLANT
DRAIN CHANNEL 19F RND (DRAIN) ×3 IMPLANT
DRAPE LAPAROSCOPIC ABDOMINAL (DRAPES) ×3 IMPLANT
DRAPE UTILITY 15X26 W/TAPE STR (DRAPE) ×6 IMPLANT
DRSG ADAPTIC 3X8 NADH LF (GAUZE/BANDAGES/DRESSINGS) ×6 IMPLANT
DRSG PAD ABDOMINAL 8X10 ST (GAUZE/BANDAGES/DRESSINGS) ×6 IMPLANT
ELECT BLADE 4.0 EZ CLEAN MEGAD (MISCELLANEOUS) ×6
ELECT BLADE 6.5 EXT (BLADE) ×1 IMPLANT
ELECT CAUTERY BLADE 6.4 (BLADE) ×3 IMPLANT
ELECT REM PT RETURN 9FT ADLT (ELECTROSURGICAL) ×3
ELECTRODE BLDE 4.0 EZ CLN MEGD (MISCELLANEOUS) ×2 IMPLANT
ELECTRODE REM PT RTRN 9FT ADLT (ELECTROSURGICAL) ×2 IMPLANT
EVACUATOR SILICONE 100CC (DRAIN) ×6 IMPLANT
GLOVE BIOGEL PI IND STRL 6.5 (GLOVE) IMPLANT
GLOVE BIOGEL PI IND STRL 7.0 (GLOVE) IMPLANT
GLOVE BIOGEL PI IND STRL 7.5 (GLOVE) IMPLANT
GLOVE BIOGEL PI INDICATOR 6.5 (GLOVE) ×1
GLOVE BIOGEL PI INDICATOR 7.0 (GLOVE) ×1
GLOVE BIOGEL PI INDICATOR 7.5 (GLOVE) ×1
GLOVE EUDERMIC 7 POWDERFREE (GLOVE) ×4 IMPLANT
GOWN STRL NON-REIN LRG LVL3 (GOWN DISPOSABLE) ×6 IMPLANT
GOWN STRL REIN XL XLG (GOWN DISPOSABLE) ×4 IMPLANT
KIT BASIN OR (CUSTOM PROCEDURE TRAY) ×3 IMPLANT
KIT ROOM TURNOVER OR (KITS) ×3 IMPLANT
NS IRRIG 1000ML POUR BTL (IV SOLUTION) ×3 IMPLANT
PACK GENERAL/GYN (CUSTOM PROCEDURE TRAY) ×3 IMPLANT
PAD ARMBOARD 7.5X6 YLW CONV (MISCELLANEOUS) ×3 IMPLANT
PENCIL BUTTON HOLSTER BLD 10FT (ELECTRODE) ×1 IMPLANT
SPECIMEN JAR X LARGE (MISCELLANEOUS) ×6 IMPLANT
SPONGE GAUZE 4X4 12PLY (GAUZE/BANDAGES/DRESSINGS) ×6 IMPLANT
SPONGE LAP 18X18 X RAY DECT (DISPOSABLE) ×2 IMPLANT
SPONGE LAP 4X18 X RAY DECT (DISPOSABLE) ×1 IMPLANT
STAPLER VISISTAT 35W (STAPLE) ×3 IMPLANT
STRIP CLOSURE SKIN 1/2X4 (GAUZE/BANDAGES/DRESSINGS) ×3 IMPLANT
SUT ETHILON 3 0 FSL (SUTURE) ×8 IMPLANT
SUT MNCRL AB 4-0 PS2 18 (SUTURE) ×3 IMPLANT
SUT SILK 2 0 FS (SUTURE) ×3 IMPLANT
SUT VIC AB 3-0 SH 18 (SUTURE) ×5 IMPLANT
TOWEL OR 17X24 6PK STRL BLUE (TOWEL DISPOSABLE) ×3 IMPLANT
TOWEL OR 17X26 10 PK STRL BLUE (TOWEL DISPOSABLE) ×3 IMPLANT
TUBE CONNECTING 12X1/4 (SUCTIONS) ×1 IMPLANT
YANKAUER SUCT BULB TIP NO VENT (SUCTIONS) ×1 IMPLANT

## 2013-06-09 NOTE — Interval H&P Note (Signed)
History and Physical Interval Note:  06/09/2013 6:12 AM  Jasmine Schwartz  has presented today for surgery, with the diagnosis of breast cancer  The goals and the various methods of treatment have been discussed with the patient and family. After consideration of risks, benefits and other options for treatment, the patient has consented to  Procedure(s): RIGHT MASTECTOMY MODIFIED RADICAL (Right) LEFT SIMPLE MASTECTOMY (Left) as a surgical intervention .  The patient's history has been reviewed, patient examined, no change in status, stable for surgery.  I have reviewed the patient's chart and labs.  Questions were answered to the patient's satisfaction.     Ernestene Mention

## 2013-06-09 NOTE — Preoperative (Signed)
Beta Blockers   Reason not to administer Beta Blockers:Not Applicable 

## 2013-06-09 NOTE — Progress Notes (Signed)
Patient blood sugar was going up to low 200's and patient was concerned and stated that she is more alert now and would like to go back to her insulin pump rather than the insulin drip. Diabetes coordinator paged and asked for advice. Md on call made aware of patients request and status. New order given and noted to start patient on her insulin pump. Will continue to monitor.

## 2013-06-09 NOTE — Anesthesia Preprocedure Evaluation (Addendum)
Anesthesia Evaluation  Patient identified by MRN, date of birth, ID band Patient awake    Reviewed: Allergy & Precautions  History of Anesthesia Complications (+) PONV  Airway Mallampati: I  Neck ROM: Full    Dental  (+) Teeth Intact   Pulmonary shortness of breath,  breath sounds clear to auscultation        Cardiovascular + Peripheral Vascular Disease Rhythm:Regular Rate:Normal     Neuro/Psych    GI/Hepatic negative GI ROS, Neg liver ROS,   Endo/Other  diabetes  Renal/GU      Musculoskeletal negative musculoskeletal ROS (+)   Abdominal   Peds  Hematology negative hematology ROS (+)   Anesthesia Other Findings   Reproductive/Obstetrics                          Anesthesia Physical Anesthesia Plan  ASA: II  Anesthesia Plan: General   Post-op Pain Management:    Induction: Intravenous  Airway Management Planned: Oral ETT and LMA  Additional Equipment:   Intra-op Plan:   Post-operative Plan: Extubation in OR  Informed Consent: I have reviewed the patients History and Physical, chart, labs and discussed the procedure including the risks, benefits and alternatives for the proposed anesthesia with the patient or authorized representative who has indicated his/her understanding and acceptance.   Dental advisory given  Plan Discussed with: CRNA and Surgeon  Anesthesia Plan Comments:         Anesthesia Quick Evaluation

## 2013-06-09 NOTE — Progress Notes (Signed)
Pt. Has insulin pump at basal rate 1.05 u/hr. Cbg  158 at 0620.

## 2013-06-09 NOTE — Transfer of Care (Signed)
Immediate Anesthesia Transfer of Care Note  Patient: Jasmine Schwartz  Procedure(s) Performed: Procedure(s): RIGHT MASTECTOMY MODIFIED RADICAL (Right) LEFT SIMPLE MASTECTOMY (Left)  Patient Location: PACU  Anesthesia Type:General  Level of Consciousness: awake, alert , oriented and sedated  Airway & Oxygen Therapy: Patient Spontanous Breathing and Patient connected to nasal cannula oxygen  Post-op Assessment: Report given to PACU RN, Post -op Vital signs reviewed and stable and Patient moving all extremities  Post vital signs: Reviewed and stable  Complications: No apparent anesthesia complications

## 2013-06-09 NOTE — Op Note (Addendum)
Patient Name:           Jasmine Schwartz   Date of Surgery:        06/09/2013  Pre op Diagnosis:      Locally advanced right breast cancer, receptor-negative, HER-2 positive, pretreatment clinical stage T3, N1  Abnormal MRI left breast, status post biopsy at 11:00 and 2:00 position revealing benign fibroadenomas  Excellent clinical and radiographic response to neoadjuvant Herceptin-based chemotherapy.    Post op Diagnosis:    Same   Procedure:                 Right modified radical mastectomy, left total mastectomy  Surgeon:                     Angelia Mould. Derrell Lolling, M.D., FACS  Assistant:                      Cyndia Bent, M.D., [facility]  Operative Indications:   This 47 year old female was initially referred to me by Dr. [name] for a very large discolored right breast mass, and she was initially seen on 12/31/2012. She had clinically positive lymph nodes. 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 There was metastatic cancer in the lymph nodes. The tumor was ER and PR negative but Her-2 positive. She had 2 small densities in the left breast on MRI. They have been biopsied and showed fibroadenomas. PET CT showed no systemic metastasis. Port-A-Cath has been inserted and she has now had several rounds of Herceptin-based chemotherapy, and her last preop treatment was on 05/01/13. She says there has been a dramatic improvement in the right breast.  She has decided she wants bilateral mastectomies, and delayed reconstruction. .   MRI performed on September 5 shows that the right breast mass has essentially resolved radiographically. The surrounding satellite masses have also resolved resolved. Skin is much thinner. The right axillary lymph nodes are improved.  She remains on her insulin pump.  She is ready to go ahead with her surgery and is anxious to get it completed. She knows that she will  need further Herceptin chemotherapy postop.   Operative Findings:       The left mastectomy specimen looked normal. The right modified radical mastectomy specimen was a little bit more to dissect, probably from neoadjuvant chemotherapy effect. We removed level one and level II lymph nodes and we were able to preserve the long thoracic nerve and the thoracodorsal neurovascular bundle. The right axillary lymph nodes did not appear pathologically enlarged.  Procedure in Detail:          Following the induction of general endotracheal anesthesia the patient's neck, breast, lateral chest wall and upper abdomen were prepped and draped in a sterile fashion. Intravenous antibiotics were given. Surgical time out was performed. Using a marking pen I marked mirror-image transverse elliptical incisions which encompassed the nipple and areolar complexes.  I performed the left mastectomy first. Incision was made. Skin flaps were raised superiorly to the infraclavicular area, being careful to preserve the port, laterally to latissimus dorsi muscle, inferiorly to the anterior rectus sheath and medially to the parasternal area. The breast was dissected off of the pectoralis major and minor muscles with  electrocautery. The breast was removed with the tail of Spence. I marked the lateral skin margin with a silk suture. Wound was  irrigated with saline. Hemostasis  was excellent and achieved with electrocautery and a few metal clips. The wound was packed with saline gauze.  I then performed the right modified radical mastectomy with a mirror-image elliptical incision. Skin flaps were raised superiorly, medially, inferiorly, and laterally in similar fashion. The breast was dissected off of the pectoralis major and minor muscles in similar fashion. The clavipectoral fascia was incised taking  the dissection up into the axilla. We dissected up until we identified the axillary vein. A couple of venous tributaries were controlled  with metal clips and divided. We pulled the level I and  II lymph nodes down. We identified the long thoracic nerve and thoracodorsal neurovascular bundle. We removed all the remaining axillary contents preserving these structures. They were functioning at the end of the case. The specimen was marked with a silk suture to mark the lateral skin margin and the specimen was sent to the lab. Hemostasis was excellent and achieved with electrocautery. We used metal clips for some neurovascular structures in the axilla. Two 19 French drains were placed on the right, one up in the axilla and one across the skin flaps and they were brought out through separate stab incisions inferolaterally and sutured to skin with nylon sutures and placed to suction bottles. A single drain was placed over the left was placed across the skin flaps and then down into the lateral space. This was secured in a similar fashion. Both incisions were closed in 2 layers, subcutaneous layer with interrupted 3-0 Vicryl and skin with running subcuticular suture of 4-0 Monocryl and Dermabond. All the drains were charged and held in charge nicely. After the Dermabond dried the dry gauze and a breast binder were placed. The patient tolerated the procedure well taken to recovery in stable condition. EBL 150 cc. Counts correct. Complications none. Insulin drip was maintained through the case because of her type 1 diabetes.      Angelia Mould. Derrell Lolling, M.D., FACS General and Minimally Invasive Surgery Breast and Colorectal Surgery  06/09/2013 10:15 AM

## 2013-06-09 NOTE — Progress Notes (Signed)
Inpatient Diabetes Program Recommendations  AACE/ADA: New Consensus Statement on Inpatient Glycemic Control (2013)  Target Ranges:  Prepandial:   less than 140 mg/dL      Peak postprandial:   less than 180 mg/dL (1-2 hours)      Critically ill patients:  140 - 180 mg/dL     **Patient admitted for mastectomy.  Started on IV insulin drip at 0737 this morning.  Patient normally uses insulin pump at home to control CBGs.  Has + history of Type 1 DM.  **Spoke with patient in person around 1330 today.  Patient was awake and alert but told me she did not feel up to putting her insulin pump back on today.  Patient expressed desire to remain on IV insulin drip throughout the night and restart her insulin pump in the morning.  **Called Dr. Derrell Lolling to discuss.  Dr. Derrell Lolling gave orders to leave patient on IV insulin drip through the night and also gave orders to start IVF of D5 1/2 NS with 20 mEq of potassium at 75cc/hour.  Patient to restart insulin pump tomorrow morning.  When patient resumes her insulin pump, continue IV insulin drip for one additional hour and then d/c IV insulin drip.  Will reassess patient in AM. Ambrose Finland RN, MSN, CDE Diabetes Coordinator Inpatient Diabetes Program Team Pager: [phone] (8a-10p)

## 2013-06-09 NOTE — Progress Notes (Signed)
Patient's bedside nurse called about patient's blood sugars increasing since patient has been started on IVF with dextrose.  Talked with the RN and the patient and explained rationale for using IVF with dextrose if patient is not eating or drinking while on an insulin drip.  Patient states that she wants to keep blood glucose between 140-180 mg/dl as the doctor has suggested and she is concerned about the last 2 blood sugars greater than 200 mg/dl.  Patient reports that she is much more alert and is wanting to resume her insulin pump at this time.  Informed RN that she would need to get orders from the doctor to transition to the insulin pump.  Asked that bedside RN call Dr. Derrell Lolling and let him know that the patient has changed her mind and that she would like to resume her insulin pump now.  If orders are given for the insulin pump to be resumed, please have patient reconnect her insulin pump and check blood sugar so that a correction can be done at that time.  Recommend continuing the IV insulin drip for one hour after patient resumes her insulin pump.  Order will need to be entered for insulin pump order set and blood glucose will be check ACHS and at 2 am (per insulin pump order set).  Also, please discuss IVF with the doctor since they do have dextrose added.  RN states that she understands information discussed and she will call Dr. Derrell Lolling for orders.    Thanks, Orlando Penner, RN, MSN, CCRN Diabetes Coordinator Inpatient Diabetes Program [phone] (Team Pager) [phone] ([facility] office) [phone] [facility] office)

## 2013-06-09 NOTE — Anesthesia Postprocedure Evaluation (Signed)
  Anesthesia Post-op Note  Patient: Jasmine Schwartz  Procedure(s) Performed: Procedure(s): RIGHT MASTECTOMY MODIFIED RADICAL (Right) LEFT SIMPLE MASTECTOMY (Left)  Patient Location: PACU  Anesthesia Type:General  Level of Consciousness: awake and alert   Airway and Oxygen Therapy: Patient Spontanous Breathing  Post-op Pain: mild  Post-op Assessment: Post-op Vital signs reviewed  Post-op Vital Signs: stable  Complications: No apparent anesthesia complications

## 2013-06-10 ENCOUNTER — Encounter (HOSPITAL_COMMUNITY): Payer: Self-pay | Admitting: General Surgery

## 2013-06-10 LAB — CBC
Hemoglobin: 9.8 g/dL — ABNORMAL LOW (ref 12.0–15.0)
MCHC: 33.1 g/dL (ref 30.0–36.0)
MCV: 94 fL (ref 78.0–100.0)
Platelets: 257 10*3/uL (ref 150–400)
RDW: 15 % (ref 11.5–15.5)
WBC: 6.2 10*3/uL (ref 4.0–10.5)

## 2013-06-10 LAB — BASIC METABOLIC PANEL
BUN: 9 mg/dL (ref 6–23)
Calcium: 8.5 mg/dL (ref 8.4–10.5)
Creatinine, Ser: 0.64 mg/dL (ref 0.50–1.10)
GFR calc Af Amer: >90 mL/min (ref >90)
Sodium: 136 mEq/L (ref 135–145)

## 2013-06-10 LAB — GLUCOSE, CAPILLARY

## 2013-06-10 NOTE — Progress Notes (Signed)
1 Day Post-Op  Subjective: Feels fine this morning. Has been up to bathroom and able to B. Not much oral intake yet. No nausea or vomiting. Pain seems reasonably well-controlled.  IV insulin drip did not work well. Converted to her insulin pump and blood sugars back under control.  Objective: Vital signs in last 24 hours: Temp:  [97.2 F (36.2 C)-98.2 F (36.8 C)] 98.2 F (36.8 C) (10/29 0210) Pulse Rate:  [96-118] 96 (10/29 0210) Resp:  [12-18] 18 (10/29 0210) BP: (102-159)/(56-92) 102/57 mmHg (10/29 0210) SpO2:  [97 %-100 %] 97 % (10/29 0210) Weight:  [157 lb 11.2 oz (71.532 kg)] 157 lb 11.2 oz (71.532 kg) (10/28 1312) Last BM Date: 06/09/13  Intake/Output from previous day: 10/28 0701 - 10/29 0700 In: 3996.3 [P.O.:840; I.V.:2906.3; IV Piggyback:250] Out: 1665 [Urine:1050; Drains:315; Blood:300] Intake/Output this shift: Total I/O In: 1386.3 [P.O.:480; I.V.:906.3] Out: 1170 [Urine:1050; Drains:120]  General appearance: alert. Oriented. Cooperative. Mental status normal. Good spirits. Resp: clear to auscultation bilaterally Breasts:  bilateral mastectomy incisions look good. Skin flaps pink and viable. No hematoma or fluid collection. Drainage serosanguineous, moderate, all 3 drains functioning.  Lab Results:  Results for orders placed during the hospital encounter of 06/09/13 (from the past 24 hour(s))  GLUCOSE, CAPILLARY     Status: Abnormal   Collection Time    06/09/13  6:19 AM      Result Value Range   Glucose-Capillary 158 (*) 70 - 99 mg/dL  POCT I-STAT GLUCOSE     Status: Abnormal   Collection Time    06/09/13  9:25 AM      Result Value Range   Operator id [id number]     Glucose, Bld 142 (*) 70 - 99 mg/dL  GLUCOSE, CAPILLARY     Status: None   Collection Time    06/09/13 10:40 AM      Result Value Range   Glucose-Capillary 93  70 - 99 mg/dL   Comment 1 Notify RN    GLUCOSE, CAPILLARY     Status: Abnormal   Collection Time    06/09/13 11:46 AM      Result  Value Range   Glucose-Capillary 131 (*) 70 - 99 mg/dL   Comment 1 Notify RN    GLUCOSE, CAPILLARY     Status: Abnormal   Collection Time    06/09/13 12:50 PM      Result Value Range   Glucose-Capillary 136 (*) 70 - 99 mg/dL  CBC     Status: Abnormal   Collection Time    06/09/13  1:30 PM      Result Value Range   WBC 13.9 (*) 4.0 - 10.5 K/uL   RBC 3.30 (*) 3.87 - 5.11 MIL/uL   Hemoglobin 10.6 (*) 12.0 - 15.0 g/dL   HCT 16.1 (*) 09.6 - 04.5 %   MCV 93.3  78.0 - 100.0 fL   MCH 32.1  26.0 - 34.0 pg   MCHC 34.4  30.0 - 36.0 g/dL   RDW 40.9  81.1 - 91.4 %   Platelets 286  150 - 400 K/uL  CREATININE, SERUM     Status: None   Collection Time    06/09/13  1:30 PM      Result Value Range   Creatinine, Ser 0.60  0.50 - 1.10 mg/dL   GFR calc non Af Amer >90  >90 mL/min   GFR calc Af Amer >90  >90 mL/min  GLUCOSE, CAPILLARY     Status: Abnormal  Collection Time    06/09/13  1:56 PM      Result Value Range   Glucose-Capillary 119 (*) 70 - 99 mg/dL  GLUCOSE, CAPILLARY     Status: Abnormal   Collection Time    06/09/13  3:01 PM      Result Value Range   Glucose-Capillary 170 (*) 70 - 99 mg/dL  GLUCOSE, CAPILLARY     Status: Abnormal   Collection Time    06/09/13  4:02 PM      Result Value Range   Glucose-Capillary 207 (*) 70 - 99 mg/dL  GLUCOSE, CAPILLARY     Status: Abnormal   Collection Time    06/09/13  5:02 PM      Result Value Range   Glucose-Capillary 230 (*) 70 - 99 mg/dL  GLUCOSE, CAPILLARY     Status: Abnormal   Collection Time    06/09/13  5:55 PM      Result Value Range   Glucose-Capillary 250 (*) 70 - 99 mg/dL  GLUCOSE, CAPILLARY     Status: Abnormal   Collection Time    06/09/13 10:07 PM      Result Value Range   Glucose-Capillary 148 (*) 70 - 99 mg/dL   Comment 1 Notify RN     Comment 2 Documented in Chart    GLUCOSE, CAPILLARY     Status: None   Collection Time    06/10/13  2:15 AM      Result Value Range   Glucose-Capillary 76  70 - 99 mg/dL      Studies/Results: @RISRSLT24 @  . enoxaparin (LOVENOX) injection  40 mg Subcutaneous Q24H  . gabapentin  100 mg Oral BID  . insulin pump   Subcutaneous TID AC, HS, 0200  . sucralfate  1 g Oral BID     Assessment/Plan: s/p Procedure(s): RIGHT MASTECTOMY MODIFIED RADICAL LEFT SIMPLE MASTECTOMY  POD #1. Stable. Advance diet and activities. Teach drain care Check lab work, pending Probably home tomorrow morning.  Type 1 diabetes. Under good control with insulin pump. Continue to monitor.  @PROBHOSP @  LOS: 1 day    Jasmine Schwartz 06/10/2013  . .prob

## 2013-06-10 NOTE — Progress Notes (Signed)
Inpatient Diabetes Program Recommendations  AACE/ADA: New Consensus Statement on Inpatient Glycemic Control (2013)  Target Ranges:  Prepandial:   less than 140 mg/dL      Peak postprandial:   less than 180 mg/dL (1-2 hours)      Critically ill patients:  140 - 180 mg/dL     Results for Jasmine Schwartz, Jasmine Schwartz (MRN [id number]) as of 06/10/2013 10:26  Ref. Range 06/10/2013 02:15 06/10/2013 07:59  Glucose-Capillary Latest Range: 70-99 mg/dL 76 69 (L)    **Patient restarted her insulin pump last night.  IV insulin drip d/c'd.  **Spoke with Ladona Ridgel, RN this morning by phone.  Reminded RN to chart all CBGs taken with hospital meter and all insulin boluses given by patient with insulin pump.  Also reminded RN to complete insulin pump management flowsheet under Doc Flowsheets section of [facility].  **Spoke with patient yesterday.  Per patient her basal rates on her pump are 1.05 units per hour 24 hours a day for a total basal of 25.2 units basal insulin per 24 hour period.  Her carbohydrate coverage is 1 unit for every 15 grams of carbohydrate and her correction factor is 1 unit for every 50 mg/dl above her target CBG of 140 mg/dl.   Will follow. Ambrose Finland RN, MSN, CDE Diabetes Coordinator Inpatient Diabetes Program Team Pager: [phone] (8a-10p)

## 2013-06-10 NOTE — Progress Notes (Signed)
Nutrition Brief Note  Patient identified on the Malnutrition Screening Tool (MST) Report  Wt Readings from Last 15 Encounters:  06/09/13 157 lb 11.2 oz (71.532 kg)  06/09/13 157 lb 11.2 oz (71.532 kg)  05/29/13 159 lb 6.4 oz (72.303 kg)  05/29/13 158 lb 5 oz (71.81 kg)  05/27/13 158 lb 8 oz (71.895 kg)  05/08/13 138 lb 4.8 oz (62.732 kg)  05/01/13 150 lb 8 oz (68.266 kg)  04/20/13 137 lb (62.143 kg)  04/17/13 137 lb 6.4 oz (62.324 kg)  04/10/13 147 lb 12.8 oz (67.042 kg)  03/27/13 136 lb 11.2 oz (62.007 kg)  03/20/13 142 lb 9.6 oz (64.683 kg)  03/06/13 133 lb 14.4 oz (60.737 kg)  02/27/13 141 lb 6.4 oz (64.139 kg)  02/12/13 136 lb 8 oz (61.916 kg)    Body mass index is 27.94 kg/(m^2). Patient meets criteria for overweight based on current BMI.   Current diet order is CHO Mod Medium, patient is consuming approximately 75-100% of meals at this time. Labs and medications reviewed.   RD met with pt who reports she lost weight over the summer related to chemotherapy, but has since regained this lost wt.  Pt states she has a great appetite and typically eats well.  She has resumed a PO diet and is eating 75-100% of meals.  Pt is diabetic and typically wears an insulin pump.  She denies nutrition-related concerns and pump and CBGs are being monitored by staff and diabetes coordinator.  Pt is planning for upcoming radiation and reports she will be taking a receptor medication for approx 1 year, but no upcoming additional rounds of chemo.  No nutrition interventions warranted at this time. If nutrition issues arise, please consult RD.   Loyce Dys, MS RD LDN Clinical Inpatient Dietitian Pager: [phone] Weekend/After hours pager: [phone]

## 2013-06-11 ENCOUNTER — Telehealth (INDEPENDENT_AMBULATORY_CARE_PROVIDER_SITE_OTHER): Payer: Self-pay

## 2013-06-11 LAB — GLUCOSE, CAPILLARY

## 2013-06-11 MED ORDER — OXYCODONE-ACETAMINOPHEN 7.5-325 MG PO TABS: 1.0000 | ORAL_TABLET | ORAL | Status: DC | PRN

## 2013-06-11 NOTE — Telephone Encounter (Signed)
Message copied by Ivory Broad on Thu Jun 11, 2013  3:05 PM ------      Message from: Ernestene Mention      Created: Thu Jun 11, 2013  2:49 PM       Inform patient of Pathology report,.Good news!  Complete pathologic response. No residual cancer in breast or nodes. ------

## 2013-06-11 NOTE — Telephone Encounter (Signed)
The pt called in to schedule follow up.  She is doing well.  I notified her of the pathology results and told her we will give her a copy of the report when she comes in.   I scheduled an appointment for her to come in 06/18/2013 at 0815.  I will call her back if Dr Derrell Lolling decides to change it.

## 2013-06-11 NOTE — Discharge Summary (Signed)
Patient ID: Jasmine Schwartz [id number] 47 y.o. 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  Operations: Procedure(s): RIGHT MASTECTOMY MODIFIED RADICAL LEFT SIMPLE MASTECTOMY  Admission Condition: good  Discharged Condition: good  Indication for Admission:  This 47 year old female was initially referred to me by Dr. [name] for a very large discolored right breast mass, and she was initially seen on 12/31/2012. She had clinically positive lymph nodes. 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 There was metastatic cancer in the lymph nodes. The tumor was ER and PR negative but Her-2 positive. She had 2 small densities in the left breast on MRI. They have been biopsied and showed fibroadenomas. PET CT showed no systemic metastasis. Port-A-Cath has been inserted and she has now had several rounds of Herceptin-based chemotherapy, and her last preop treatment was on 05/01/13.  There has been a dramatic improvement in the right breast.  She has decided she wants bilateral mastectomies, and delayed reconstruction. .  MRI performed on September 5 shows that the right breast mass has essentially resolved radiographically. The surrounding satellite masses have also resolved resolved. Skin is much thinner. The right axillary lymph nodes are improved.  She remains on her  insulin pump.  She is ready to go ahead with her surgery and is anxious to get it completed. She knows that she will need further Herceptin chemotherapy postop.   Hospital Course: On the day of admission the patient was taken to the operating room. She underwent right modified radical mastectomy and left total mastectomy. Surgical pathology is pending at the time of this dictation. Postoperatively she did well. Diabetes was initially managed with an insulin infusion, this was transitioned back to her insulin pump as soon as possible, and her capillary glucoses were actually much better controlled with the insulin pump. She progressed in her diet and activities without much difficulty. She felt ready for discharge in the early morning hours of October 30. At the time of discharge her mastectomy skin flaps looked good. No fluid collection. No necrosis.. All 3 drains were functioning well draining serosanguineous fluid, approximately 50 cc in a 24-hour period and all the drains were left in place. She was given instructions in diet, activities, and drain care. She was asked to return to see me in the office in 7-8 days for a wound check. She was given a prescription for Percocet for pain. She was otherwise told to continue all of her usual medications without changes. We will call pathology report to her.  Consults: None  Significant Diagnostic Studies: Surgical pathology, pending.  Treatments: surgery: Right modified radical mastectomy, left total mastectomy.  Disposition: Home  Patient Instructions:    Medication List         CARAFATE 1 GM/10ML suspension  Generic drug:  sucralfate     cyanocobalamin 500 MCG tablet  Take 500 mcg by mouth daily.     dexamethasone 4 MG tablet  Commonly known as:  DECADRON  furosemide 20 MG tablet  Commonly known as:  LASIX  Take 20 mg by mouth daily as needed for fluid.     gabapentin 100 MG capsule  Commonly known as:  NEURONTIN  Take 1 capsule  (100 mg total) by mouth 2 (two) times daily.     insulin lispro 100 UNIT/ML injection  Commonly known as:  HUMALOG  Inject as directed into Insulin pump, total of 40 units/day     LORazepam 0.5 MG tablet  Commonly known as:  ATIVAN  Take 0.5 mg by mouth every 8 (eight) hours as needed for anxiety.     MINIMED INFUSION SET-MMT 397 Misc  1 Device by Does not apply route every 3 (three) days.     PARADIGM PUMP RESERVOIR 1.76ML Misc  1 Device by Does not apply route every 3 (three) days.     MINIMED INSULIN PUMP Devi  Inject 1 Units/hr into the vein continuous. Pt give extra per cards at meal times. Basic meals are about 5 units     NOVA MAX TEST test strip  Generic drug:  glucose blood  Use as directed up to 8 times a day     ondansetron 8 MG tablet  Commonly known as:  ZOFRAN     oxyCODONE-acetaminophen 7.5-325 MG per tablet  Commonly known as:  PERCOCET  Take 1 tablet by mouth every 4 (four) hours as needed for pain.     potassium chloride SA 20 MEQ tablet  Commonly known as:  K-DUR,KLOR-CON  Take 20 mEq by mouth daily as needed (when taking Furosemide).        Activity: activity as tolerated Diet: diabetic diet Wound Care: as directed  Follow-up:  With Dr. Derrell Lolling in 2 weeks.  Signed: Angelia Mould. Derrell Lolling, M.D., FACS General and minimally invasive surgery Breast and Colorectal Surgery  06/11/2013, 6:18 AM

## 2013-06-11 NOTE — Progress Notes (Signed)
Quick Note:  Inform patient of Pathology report,.Good news! Complete pathologic response. No residual cancer in breast or nodes. ______

## 2013-06-16 ENCOUNTER — Telehealth: Payer: Self-pay | Admitting: *Deleted

## 2013-06-16 ENCOUNTER — Encounter: Payer: Self-pay | Admitting: Oncology

## 2013-06-16 ENCOUNTER — Ambulatory Visit (HOSPITAL_BASED_OUTPATIENT_CLINIC_OR_DEPARTMENT_OTHER): Payer: BC Managed Care – PPO | Admitting: Oncology

## 2013-06-16 ENCOUNTER — Ambulatory Visit (HOSPITAL_BASED_OUTPATIENT_CLINIC_OR_DEPARTMENT_OTHER): Payer: BC Managed Care – PPO

## 2013-06-16 ENCOUNTER — Other Ambulatory Visit (HOSPITAL_BASED_OUTPATIENT_CLINIC_OR_DEPARTMENT_OTHER): Payer: BC Managed Care – PPO | Admitting: Lab

## 2013-06-16 VITALS — BP 148/71 | HR 91 | Temp 97.7°F | Resp 20 | Ht 63.0 in | Wt 149.5 lb

## 2013-06-16 DIAGNOSIS — C50911 Malignant neoplasm of unspecified site of right female breast: Secondary | ICD-10-CM

## 2013-06-16 DIAGNOSIS — C773 Secondary and unspecified malignant neoplasm of axilla and upper limb lymph nodes: Secondary | ICD-10-CM

## 2013-06-16 DIAGNOSIS — Z5112 Encounter for antineoplastic immunotherapy: Secondary | ICD-10-CM

## 2013-06-16 DIAGNOSIS — R110 Nausea: Secondary | ICD-10-CM

## 2013-06-16 DIAGNOSIS — Z171 Estrogen receptor negative status [ER-]: Secondary | ICD-10-CM

## 2013-06-16 DIAGNOSIS — C50419 Malignant neoplasm of upper-outer quadrant of unspecified female breast: Secondary | ICD-10-CM

## 2013-06-16 LAB — CBC WITH DIFFERENTIAL/PLATELET
Basophils Absolute: 0 10*3/uL (ref 0.0–0.1)
EOS%: 2.6 % (ref 0.0–7.0)
Eosinophils Absolute: 0.2 10*3/uL (ref 0.0–0.5)
HCT: 34.6 % — ABNORMAL LOW (ref 34.8–46.6)
HGB: 11.3 g/dL — ABNORMAL LOW (ref 11.6–15.9)
MCH: 30.2 pg (ref 25.1–34.0)
MCV: 92.5 fL (ref 79.5–101.0)
MONO#: 0.5 10*3/uL (ref 0.1–0.9)
MONO%: 5.9 % (ref 0.0–14.0)
NEUT%: 74.3 % (ref 38.4–76.8)
RBC: 3.74 10*6/uL (ref 3.70–5.45)
WBC: 8.2 10*3/uL (ref 3.9–10.3)
lymph#: 1.4 10*3/uL (ref 0.9–3.3)

## 2013-06-16 LAB — COMPREHENSIVE METABOLIC PANEL (CC13)
Albumin: 3.1 g/dL — ABNORMAL LOW (ref 3.5–5.0)
Alkaline Phosphatase: 55 U/L (ref 40–150)
Anion Gap: 11 mEq/L (ref 3–11)
BUN: 16.3 mg/dL (ref 7.0–26.0)
CO2: 21 mEq/L — ABNORMAL LOW (ref 22–29)
Glucose: 250 mg/dl — ABNORMAL HIGH (ref 70–140)
Potassium: 4.4 mEq/L (ref 3.5–5.1)
Total Bilirubin: 0.38 mg/dL (ref 0.20–1.20)

## 2013-06-16 MED ORDER — SODIUM CHLORIDE 0.9 % IV SOLN
Freq: Once | INTRAVENOUS | Status: AC
  Administered 2013-06-16: 12:00:00 via INTRAVENOUS

## 2013-06-16 MED ORDER — DIPHENHYDRAMINE HCL 25 MG PO CAPS
50.0000 mg | ORAL_CAPSULE | Freq: Once | ORAL | Status: AC
  Administered 2013-06-16: 50 mg via ORAL

## 2013-06-16 MED ORDER — DIPHENHYDRAMINE HCL 25 MG PO CAPS
ORAL_CAPSULE | ORAL | Status: AC
  Filled 2013-06-16: qty 2

## 2013-06-16 MED ORDER — ONDANSETRON 8 MG/50ML IVPB (CHCC): 8.0000 mg | Freq: Once | INTRAVENOUS | Status: DC

## 2013-06-16 MED ORDER — TRASTUZUMAB CHEMO INJECTION 440 MG
6.0000 mg/kg | Freq: Once | INTRAVENOUS | Status: AC
  Administered 2013-06-16: 399 mg via INTRAVENOUS
  Filled 2013-06-16: qty 19

## 2013-06-16 MED ORDER — ACETAMINOPHEN 325 MG PO TABS
ORAL_TABLET | ORAL | Status: AC
  Filled 2013-06-16: qty 2

## 2013-06-16 MED ORDER — SODIUM CHLORIDE 0.9 % IJ SOLN
10.0000 mL | INTRAMUSCULAR | Status: DC | PRN
  Administered 2013-06-16: 10 mL
  Filled 2013-06-16: qty 10

## 2013-06-16 MED ORDER — ACETAMINOPHEN 325 MG PO TABS
650.0000 mg | ORAL_TABLET | Freq: Once | ORAL | Status: AC
  Administered 2013-06-16: 650 mg via ORAL

## 2013-06-16 MED ORDER — HEPARIN SOD (PORK) LOCK FLUSH 100 UNIT/ML IV SOLN
500.0000 [IU] | Freq: Once | INTRAVENOUS | Status: AC | PRN
  Administered 2013-06-16: 500 [IU]
  Filled 2013-06-16: qty 5

## 2013-06-16 NOTE — Progress Notes (Signed)
Chaplain made follow-up visit. Patient was getting ready to leave infusion room. Chaplain chatted with patient about a book they had both been reading and also inquired about her health. Patient stated that she had had a drug reaction today and had to stay longer than she anticipated. She also said she would need to come back tomorrow for an infusion. Chaplain expressed hope that she would feel better tomorrow. Pt expressed appreciation for visit.

## 2013-06-16 NOTE — Patient Instructions (Signed)
[facility] Discharge Instructions for Patients Receiving Chemotherapy  Today you received the following chemotherapy agents Herceptin.  To help prevent nausea and vomiting after your treatment, we encourage you to take your nausea medication as prescribed.   If you develop nausea and vomiting that is not controlled by your nausea medication, call the clinic.   BELOW ARE SYMPTOMS THAT SHOULD BE REPORTED IMMEDIATELY:  *FEVER GREATER THAN 100.5 F  *CHILLS WITH OR WITHOUT FEVER  NAUSEA AND VOMITING THAT IS NOT CONTROLLED WITH YOUR NAUSEA MEDICATION  *UNUSUAL SHORTNESS OF BREATH  *UNUSUAL BRUISING OR BLEEDING  TENDERNESS IN MOUTH AND THROAT WITH OR WITHOUT PRESENCE OF ULCERS  *URINARY PROBLEMS  *BOWEL PROBLEMS  UNUSUAL RASH Items with * indicate a potential emergency and should be followed up as soon as possible.  Feel free to call the clinic you have any questions or concerns. The clinic phone number is [phone].    

## 2013-06-16 NOTE — Progress Notes (Signed)
OFFICE PROGRESS NOTE  CC  [name], MD [address] [place] [address] Dr. Lurline Hare  Dr. 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 At that time she had mammogram performed and it was negative. However her breast continue to increase in size. Initially patient felt that it could have been cysts or something on and therefore she did not go to a physician. However subsequently patient's breast became quite painful and she also began to feel in knot under her right arm.because of this she went to her physician who did a physical exam and a mammogram was performed on 01/01/2013 the mammogram showed diffuse skin thickening generalized increased density within the right breast and enlarged right axillary lymph nodes. She went on to have an ultrasound of the right breast performed that showed a large spiculated hypoechoic mass in the right breast 11:00 2 cm from the nipple measuring at least 4.5 cm in size. There were abnormal lymph nodes with thickened cortex and the right axilla the largest one measuring 3.1 cm in dimension.  #2Patient was recommended ultrasound guided core biopsy of the right breast mass and the right axilla lymph node. The biopsy was performed on 01/01/2013. The right needle core biopsy of the primary mass showed invasive mammary carcinoma grade 2-3 was invasive ductal. The lymph node of the right axilla was positive for metastatic disease. The tumor was ER PR negative HER-2/neu positive with a Ki-67 of 76%. On 01/09/2013 patient had MRI of the breasts performed that showed diffuse right breast skin thickening and edema with right axillary lymph node. There was a 5 mm internal mammary lymph node that  was incompletely imaged. The right breast mass measured 8 x 5 x 4.7 cm. There were abnormal areas involving all 4 quadrants. There was however no involvement of the pectoralis minor for lower chest wall. The left breast showed 2 areas of nodular enhancement these were biopsied and were fibroadenomas  #3 it was recommended that patient begin neoadjuvant chemotherapy consisting of Taxotere carboplatinum Herceptin and perjeta. This would be given every 3 weeks with day 2 Neulasta. A total of 6 cycles of therapy were planned. Once she completes the treatment then she would proceed with MRI of the breasts again for evaluation a response to therapy and eventually a mastectomy.  #4 patient is status post Neoadjuvant with curative intentTaxotere carboplatinum Herceptin and perjeta starting 01/16/2013 - 05/01/2013.  #5 patient is status post bilateral mastectomies with the left breast revealing no evidence of cancer. Right breast reveals no residual carcinoma 14 right axillary lymph nodes were negative for metastatic disease.  #6 patient began adjuvant Herceptin starting 06/16/2013. Patient will complete out 1 year of adjuvant Herceptin.  CURRENT THERAPY:  Adjuvant herceptin beginning 06/16/13 INTENT: Curative  INTERVAL HISTORY: Jasmine Schwartz 47 y.o. female returns for followup visit today. Patient has now had her mastectomies performed. She is recovering. She has minimal pain. She does have prescriptions for oxycodone from Dr. Derrell Lolling. She is taking them on as needed basis. She does not have any constipation. Patient denies any headaches double vision blurring of vision. She did experience some nausea today. I will plan on giving her some Zofran. She has no shortness of breath chest pains  or palpitations. She denies having diarrhea. No peripheral paresthesias no bleeding problems. Remainder of the 10 point review of systems is negative. MEDICAL HISTORY: Past Medical History  Diagnosis Date  . Anxiety   .  Depression   . Dyslipidemia   . Anxiety and depression 07/06/2011  . Hx of insertion of insulin pump ~ 2001 to present (06/09/2013)  . PONV (postoperative nausea and vomiting)   . Venous insufficiency   . Lower extremity edema   . Diabetes mellitus     IDDM  . Type I diabetes mellitus     uses insulin pump  . Breast cancer 01/01/13    /metastatic 1/1 lymph nodes    ALLERGIES:  has No Known Allergies.  MEDICATIONS:  Current Outpatient Prescriptions  Medication Sig Dispense Refill  . gabapentin (NEURONTIN) 100 MG capsule Take 1 capsule (100 mg total) by mouth 2 (two) times daily.  60 capsule  6  . glucose blood (NOVA MAX TEST) test strip Use as directed up to 8 times a day      . Insulin Infusion Pump (MINIMED INSULIN PUMP) DEVI Inject 1 Units/hr into the vein continuous. Pt give extra per cards at meal times. Basic meals are about 5 units      . Insulin Infusion Pump Supplies (MINIMED INFUSION SET-MMT 397) MISC 1 Device by Does not apply route every 3 (three) days.      . Insulin Infusion Pump Supplies (PARADIGM PUMP RESERVOIR 1.76ML) MISC 1 Device by Does not apply route every 3 (three) days.      . insulin lispro (HUMALOG) 100 UNIT/ML injection Inject as directed into Insulin pump, total of 40 units/day  20 mL  2  . oxyCODONE-acetaminophen (PERCOCET) 7.5-325 MG per tablet Take 1 tablet by mouth every 4 (four) hours as needed for pain.  30 tablet  0  . cyanocobalamin 500 MCG tablet Take 500 mcg by mouth daily.      . furosemide (LASIX) 20 MG tablet Take 20 mg by mouth daily as needed for fluid.      Marland Kitchen LORazepam (ATIVAN) 0.5 MG tablet Take 0.5 mg by mouth every 8 (eight) hours as needed for anxiety.      . ondansetron (ZOFRAN) 8 MG tablet       . potassium chloride SA (K-DUR,KLOR-CON) 20 MEQ tablet Take 20 mEq by mouth daily as needed (when taking Furosemide).       Current Facility-Administered Medications  Medication Dose Route Frequency Provider Last Rate Last Dose  . ondansetron  (ZOFRAN) IVPB 8 mg  8 mg Intravenous Once Victorino December, MD        SURGICAL HISTORY:  Past Surgical History  Procedure Laterality Date  . Lumbar disc surgery  2009    Dr Jillyn Hidden  . Cesarean section  1989  . Tubal ligation  1991  . Breast biopsy Right 01/01/13    Invasive mammary ca,metastatic in 1/1 lymph node  . Breast biopsy Left 01/09/13    Fibroadenoma,microcalcifications  . Portacath placement Left 01/2013  . Mastectomy modified radical Right 06/09/2013  . Mastectomy complete / simple Left 06/09/2013  . Mastectomy modified radical Right 06/09/2013    Procedure: RIGHT MASTECTOMY MODIFIED RADICAL;  Surgeon: Ernestene Mention, MD;  Location: [facility] OR;  Service: General;  Laterality: Right;  . Simple mastectomy with axillary sentinel node biopsy Left 06/09/2013    Procedure: LEFT SIMPLE MASTECTOMY;  Surgeon: Ernestene Mention, MD;  Location: [facility] OR;  Service: General;  Laterality: Left;  REVIEW OF SYSTEMS:  Pertinent items are noted in HPI.   HEALTH MAINTENANCE:  PHYSICAL EXAMINATION: Blood pressure 148/71, pulse 91, temperature 97.7 F (36.5 C), temperature source Oral, resp. rate 20, height 5\' 3"  (1.6 m), weight 149 lb 8 oz (67.813 kg), last menstrual period 01/11/2013. Body mass index is 26.49 kg/(m^2). ECOG PERFORMANCE STATUS: 0 - Asymptomatic   General appearance: alert, cooperative and appears stated age Resp: clear to auscultation bilaterally Cardio: regular rate and rhythm GI: soft, non-tender; bowel sounds normal; no masses,  no organomegaly Extremities: extremities normal, atraumatic, no cyanosis or edema Neurologic: Grossly normal Right breast reveals a large palpable mass without any nipple discharge it is softer Left breast no masses or nipple discharge  LABORATORY DATA: Lab Results  Component Value Date   WBC 8.2 06/16/2013   HGB 11.3* 06/16/2013   HCT 34.6* 06/16/2013   MCV 92.5 06/16/2013   PLT 279 06/16/2013      Chemistry      Component Value Date/Time   NA  136 06/10/2013 0600   NA 130* 05/08/2013 1108   K 3.5 06/10/2013 0600   K 4.7 05/08/2013 1108   CL 104 06/10/2013 0600   CL 99 01/23/2013 1251   CO2 26 06/10/2013 0600   CO2 22 05/08/2013 1108   BUN 9 06/10/2013 0600   BUN 9.1 05/08/2013 1108   CREATININE 0.64 06/10/2013 0600   CREATININE 0.8 05/08/2013 1108      Component Value Date/Time   CALCIUM 8.5 06/10/2013 0600   CALCIUM 9.1 05/08/2013 1108   ALKPHOS 47 05/29/2013 0858   ALKPHOS 80 05/08/2013 1108   AST 24 05/29/2013 0858   AST 25 05/08/2013 1108   ALT 16 05/29/2013 0858   ALT 22 05/08/2013 1108   BILITOT 0.3 05/29/2013 0858   BILITOT 0.97 05/08/2013 1108     Pathology 06/09/2013:  Diagnosis 1. Breast, simple mastectomy, Left - FIBROCYSTIC CHANGE WITH ADENOSIS AND CALCIFICATIONS. - NO MALIGNANCY IDENTIFIED. 2. Breast, modified radical mastectomy , Right - BENIGN BREAST TISSUE WITH EXTENSIVE FIBROSIS CONSISTENT WITH TREATMENT EFFECT. - NO RESIDUAL CARCINOMA IDENTIFIED. - FOURTEEN LYMPH NODES NEGATIVE FOR CARCINOMA WITH FOCAL HYLANIZATION (0/14). - SEE ONCOLOGY TABLE AND COMMENT. Microscopic Comment 2. BREAST, INVASIVE TUMOR, WITH LYMPH NODE SAMPLING Specimen, including laterality and lymph node sampling (sentinel, non-sentinel): Right breast and axillary contents. Procedure: Right modified radical mastectomy. Histologic type, tumor grade, tumor size, margins, lympovascular invasion, tumor focality, and extent of tumor can not be assessed, as there is no residual carcinoma. Ductal carcinoma in situ: Absent. Lobular neoplasia: Absent. Treatment effect: Present, extensive. If present, treatment effect in breast tissue, lymph nodes or both: Both. Lymph nodes: Examined: 14 Total Lymph nodes with metastasis: 0 Isolated tumor cells (< 0.2 mm): 0 Micrometastasis: (> 0.2 mm and < 2.0 mm): 0 Macrometastasis: (> 2.0 mm): 0 Extracapsular extension: 0 Breast prognostic profile: [id number] Estrogen receptor:  Negative. Progesterone receptor: Negative. Her 2 neu: Amplified. Ki-67: 76% Non-neoplastic breast: Fibrocystic change. 1 of 3 FINAL for RANDALYN, AHMED [phone]) Microscopic Comment(continued) TNM: ypT0, ypN0 Comments: There is no residual tumor in the current breast specimen, status post neoadjuvant chemotherapy, and therefore it is best staged as ypT0. Many of the nodes exhibit fibrosis consistent with treatment effect. Immunohistochemistry for pancytokeratin was performed on the two largest lymph nodes and was negative for carcinoma, resulting in a stage of ypN0. Valinda Hoar MD Pathologist, Electronic Signature (Case signed 06/11/2013) Specimen Gross and Clinical  RADIOGRAPHIC STUDIES:  Ct Chest W Contrast  01/20/2013   *RADIOLOGY REPORT*  Clinical Data:  High risk breast cancer.  Evaluate for metastatic disease.  CT CHEST, ABDOMEN AND PELVIS WITH CONTRAST  Technique:  Multidetector CT imaging of the chest, abdomen and pelvis was performed following the standard protocol during bolus administration of intravenous contrast.  Contrast: OMNIPAQUE IOHEXOL 300 MG/ML  SOLN  Comparison:  None  CT CHEST  Findings:  Lungs/pleura: There is no pleural effusion.  No suspicious pulmonary nodule or mass noted.  Heart/Mediastinum: Heart size is normal.  No pericardial effusion. No mediastinal or hilar adenopathy.  Bones/Musculoskeletal:  Asymmetric increased soft tissue within the right breast parenchyma corresponding to patient's biopsy-proven invasive breast carcinoma.  There is associated overlying skin thickening.  Multiple enlarged right axillary lymph nodes are identified.  Index lymph node measures 1.7 cm, image 23/series 2. Several enhancing, sub centimeter retropectoral lymph nodes are identified.  IMPRESSION:  1.  No acute findings. 2.  Right breast invasive carcinoma with associated right axillary adenopathy.  CT ABDOMEN AND PELVIS  Findings:  There are no focal liver abnormalities  identified.  The gallbladder appears normal.  There is no biliary dilatation.  The pancreas is unremarkable.  Normal appearance of the spleen.  The adrenal glands are both normal.  The kidneys are unremarkable. Urinary bladder appears normal.  The uterus is unremarkable. Corpus luteal cyst is noted in the left ovary measuring 1.6 cm.  No free fluid or fluid collections identified within the upper abdomen or pelvis.  The abdominal aorta has a normal caliber. There is no adenopathy identified within the upper abdomen.  No pelvic or inguinal adenopathy noted.  The stomach is normal.  The small bowel loops are unremarkable. Normal appearance of the colon.  Review of the visualized osseous structures is negative for aggressive lytic or sclerotic bone lesion.  IMPRESSION:  1.  No acute findings within the abdomen or pelvis. 2.  No mass or adenopathy.   Original Report Authenticated By: Signa Kell, M.D.   US Breast Left  01/09/2013   *RADIOLOGY REPORT*  Clinical Data:  47 year old female with newly diagnosed right breast cancer.  Left mammogram to compare to presurgical MRI.  DIGITAL DIAGNOSTIC LEFT MAMMOGRAM WITH CAD AND LEFT BREAST ULTRASOUND:  Comparison:  06/11/2012 mammogram and 01/09/2013 MRI  Findings:  ACR Breast Density Category 3: The breast tissue is heterogeneously dense.  A possible circumscribed mass in the upper outer left breast is identified. No suspicious calcifications or distortion identified.  Mammographic images were processed with CAD.  On physical exam, no palpable abnormalities identified within the upper left breast.  Ultrasound is performed, showing a 6 x 6 x 8 mm slightly irregular hypoechoic mass at the 11 o'clock position of the left breast 5 cm from the nipple An 11 x 7 x 9 mm slightly irregular hypoechoic mass at the 2 o'clock position of the left breast 4 cm from the nipple is also present. No enlarged or abnormal appearing left axillary lymph nodes are identified.  IMPRESSION:  Indeterminate 6 x 6 x 8 mm hypoechoic mass at the 11 o'clock position of the left breast and 11 x 7 x 9 mm hypoechoic mass in the 2 o'clock position of the left breast.  The 11 o'clock position mass probably represents the enhancing nodule identified on today's MRI.  Tissue sampling of both masses is recommended.  BI-RADS CATEGORY 4:  Suspicious abnormality - biopsy should be considered.  RECOMMENDATION: Ultrasound guided biopsies of both left breast masses.  The findings  and recommendations were discussed with the patient and she desires to proceed with ultrasound guided left breast biopsies. These biopsies will be performed today but dictated in a separate report.  Results were also provided in writing at the conclusion of the visit.  If applicable, a reminder letter will be sent to the patient regarding the next appointment.   Original Report Authenticated By: Harmon Pier, M.D.   Ct Abdomen Pelvis W Contrast  01/22/2013   *RADIOLOGY REPORT*  Clinical Data:  High risk breast cancer.  Evaluate for metastatic disease.  CT CHEST, ABDOMEN AND PELVIS WITH CONTRAST  Technique:  Multidetector CT imaging of the chest, abdomen and pelvis was performed following the standard protocol during bolus administration of intravenous contrast.  Contrast: OMNIPAQUE IOHEXOL 300 MG/ML  SOLN  Comparison:  None  CT CHEST  Findings:  Lungs/pleura: There is no pleural effusion.  No suspicious pulmonary nodule or mass noted.  Heart/Mediastinum: Heart size is normal.  No pericardial effusion. No mediastinal or hilar adenopathy.  Bones/Musculoskeletal:  Asymmetric increased soft tissue within the right breast parenchyma corresponding to patient's biopsy-proven invasive breast carcinoma.  There is associated overlying skin thickening.  Multiple enlarged right axillary lymph nodes are identified.  Index lymph node measures 1.7 cm, image 23/series 2. Several enhancing, sub centimeter retropectoral lymph nodes are identified.   IMPRESSION:  1.  No acute findings. 2.  Right breast invasive carcinoma with associated right axillary adenopathy.  CT ABDOMEN AND PELVIS  Findings:  There are no focal liver abnormalities identified.  The gallbladder appears normal.  There is no biliary dilatation.  The pancreas is unremarkable.  Normal appearance of the spleen.  The adrenal glands are both normal.  The kidneys are unremarkable. Urinary bladder appears normal.  The uterus is unremarkable. Corpus luteal cyst is noted in the left ovary measuring 1.6 cm.  No free fluid or fluid collections identified within the upper abdomen or pelvis.  The abdominal aorta has a normal caliber. There is no adenopathy identified within the upper abdomen.  No pelvic or inguinal adenopathy noted.  The stomach is normal.  The small bowel loops are unremarkable. Normal appearance of the colon.  Review of the visualized osseous structures is negative for aggressive lytic or sclerotic bone lesion.  IMPRESSION:  1.  No acute findings within the abdomen or pelvis. 2.  No mass or adenopathy.   Original Report Authenticated By: Signa Kell, M.D.   Mr Breast Bilateral W Wo Contrast  01/09/2013   *RADIOLOGY REPORT*  Clinical Data: Biopsy-proven right breast inflammatory carcinoma with primary invasive mammary carcinoma and previous biopsy and known right axillary metastatic disease.  Preoperative staging MRI.  BUN and creatinine were obtained on site at [facility] at [address] Results:  BUN 10 mg/dL,  Creatinine 0.9 mg/dL.  BILATERAL BREAST MRI WITH AND WITHOUT CONTRAST  Technique: Multiplanar, multisequence MR images of both breasts were obtained prior to and following the intravenous administration of 13ml of Multihance.  Three dimensional images were evaluated at the independent DynaCad workstation.  Comparison:  Prior mammograms and ultrasound.  The patient underwent diagnostic mammography and ultrasound of the left breast today as no recent prior exam  was available after the recent diagnosis in the right breast.  Findings: There is diffuse right breast skin thickening and edema, trabecular edema, and right axillary lymphadenopathy with maximal nodal measurement 3.1 cm short-axis diameter.  There is also a 5 mm internal mammary artery chain lymph node identified image 26  series 2. There is an incompletely visualized lymph node subjacent to the pectoralis minor muscle on the first image of the T2-weighted series.  There is a large irregular enhancing mass in the right breast 11 o'clock location with associated clip artifact measuring maximally 8.0 x 5.6 by 4.7 cm. This demonstrates washin/washout type enhancement kinetics.  This corresponds to the biopsy-proven invasive mammary carcinoma.  There are abnormal nodular areas of trabecular has been throughout the right breast involving all four quadrants. No abnormal underlying pectoralis enhancement or chest wall involvement is identified.  In the left breast 11 o'clock location, there is an 8 mm enhancing nodule corresponding to that seen at the examination earlier today. This area demonstrates plateau type enhancement kinetics.  No left- sided axillary or internal mammary artery chain lymphadenopathy is identified.  There is mild inhomogeneous nodular enhancement in the left breast two o'clock location at the site of the mass seen in this location earlier today, measuring 9 mm, measuring progressive enhancement kinetics.  IMPRESSION: Large irregular enhancing right breast mass 11 o'clock location corresponding to biopsy-proven invasive mammary carcinoma, with metastatic right axillary lymphadenopathy as well as skin and trabecular thickening/enhancement compatible with the clinical diagnosis of inflammatory and/or locally advanced breast cancer.  5 mm abnormal right internal mammary artery chain lymph node.  8 mm enhancing mass left breast 11 o'clock location and mildly irregular area of mass-like enhancement in the  left breast two o'clock location, corresponding to those seen at diagnostic examination earlier today.  Biopsy has been scheduled for today and will be dictated separately.  RECOMMENDATION: Treatment plan  THREE-DIMENSIONAL MR IMAGE RENDERING ON INDEPENDENT WORKSTATION:  Three-dimensional MR images were rendered by post-processing of the original MR data on an independent workstation.  The three- dimensional MR images were interpreted, and findings were reported in the accompanying complete MRI report for this study.  BI-RADS CATEGORY 6:  Known biopsy-proven malignancy - appropriate action should be taken.   Original Report Authenticated By: Christiana Pellant, M.D.   Nm Pet Image Initial (pi) Skull Base To Thigh  01/20/2013   *RADIOLOGY REPORT*  Clinical Data: Initial treatment strategy for breast cancer.  NUCLEAR MEDICINE PET SKULL BASE TO THIGH  Fasting Blood Glucose:  263  Technique:  19.1 mCi F-18 FDG was injected intravenously. CT data was obtained and used for attenuation correction and anatomic localization only.  (This was not acquired as a diagnostic CT examination.) Additional exam technical data entered on technologist worksheet.  Comparison:  None  Findings:  Neck: No hypermetabolic lymph nodes in the neck.  Chest:  No hypermetabolic mediastinal or hilar nodes.  No suspicious pulmonary nodules on the CT scan. Enlarged right axillary lymph nodes identified.  Index lymph node measures 1.7 cm and has an SUV max equal to 2.8, image 77.  There is asymmetric increased uptake throughout the right breast with associated overlying skin thickening.  This has an SUV max equal to 4.4, image 101.  Abdomen/Pelvis:  No abnormal hypermetabolic activity within the liver, pancreas, adrenal glands, or spleen.  No hypermetabolic lymph nodes in the abdomen or pelvis. There is a small focus of increased FDG uptake within the left ovary.  This has an SUV max equal to 13.1, image 217.  Skeleton:  No focal hypermetabolic  activity to suggest skeletal metastasis.  IMPRESSION:  1.  Asymmetric increased uptake associated with the right breast and right breast skin thickening corresponding to biopsy-proven invasive breast cancer. 2.  Enlarged and hypermetabolic right axillary lymph nodes consistent  with metastatic adenopathy. 3.  No evidence for distant metastatic disease. 4.  Focal area of increased uptake within the left axilla.  The patient is noted to have a corpus luteal cyst are not diagnostic CT images.  This activity is favored to represent physiologic activity.  Consider confirmatory imaging with pelvic sonogram.   Original Report Authenticated By: Signa Kell, M.D.   Ir Fluoro Guide Cv Line Left  01/15/2013   *RADIOLOGY REPORT*  Clinical Data/Indication: Breast cancer  TUNNEL POWER PORT PLACEMENT WITH SUBCUTANEOUS POCKET UTILIZING ULTRASOUND & FLOUROSCOPY  Sedation: Versed 5.0 mg, Fentanyl 250 mcg.  Total Moderate Sedation Time: 35 minutes.  As antibiotic prophylaxis, Ancef  was ordered pre-procedure and administered intravenously within one hour of incision.  Fluoroscopy Time: 36 seconds.  Procedure:  After written informed consent was obtained, patient was placed in the supine position on angiographic table. The left neck and chest was prepped and draped in a sterile fashion. Lidocaine was utilized for local anesthesia.  The left internal jugular vein was noted to be patent initially with ultrasound. Under sonographic guidance, a micropuncture needle was inserted into the left IJ vein (Ultrasound and fluoroscopic image documentation was performed). The needle was removed over an 018 wire which was exchanged for a Amplatz.  This was advanced into the IVC.  An 8-French dilator was advanced over the Amplatz.  A small incision was made in the left upper chest over the anterior left second rib.  Utilizing blunt dissection, a subcutaneous pocket was created in the caudal direction. The pocket was irrigated with a copious amount  of sterile normal saline.  The port catheter was tunneled from the chest incision, and out the neck incision.  The reservoir was inserted into the subcutaneous pocket and secured with two 3-0 Ethilon stitches.  A peel-away sheath was advanced over the Amplatz wire.  The port catheter was cut to measure length and inserted through the peel-away sheath.  The peel-away sheath was removed.  The chest incision was closed with 3-0 Vicryl interrupted stitches for the subcutaneous tissue and a running of 4- 0 Vicryl subcuticular stitch for the skin.  The neck incision was closed with a 4-0 Vicryl subcuticular stitch.  Derma-bond was applied to both surgical incisions.  The port reservoir was flushed and instilled with heparinized saline.  No complications.  Findings:  A left IJ vein Port-A-Cath is in place with its tip at the cavoatrial junction.  IMPRESSION: Successful 8 French left internal jugular vein power port placement with its tip at the SVC/RA junction.   Original Report Authenticated By: Jolaine Click, M.D.   Ir US Guide Vasc Access Left  01/15/2013   *RADIOLOGY REPORT*  Clinical Data/Indication: Breast cancer  TUNNEL POWER PORT PLACEMENT WITH SUBCUTANEOUS POCKET UTILIZING ULTRASOUND & FLOUROSCOPY  Sedation: Versed 5.0 mg, Fentanyl 250 mcg.  Total Moderate Sedation Time: 35 minutes.  As antibiotic prophylaxis, Ancef  was ordered pre-procedure and administered intravenously within one hour of incision.  Fluoroscopy Time: 36 seconds.  Procedure:  After written informed consent was obtained, patient was placed in the supine position on angiographic table. The left neck and chest was prepped and draped in a sterile fashion. Lidocaine was utilized for local anesthesia.  The left internal jugular vein was noted to be patent initially with ultrasound. Under sonographic guidance, a micropuncture needle was inserted into the left IJ vein (Ultrasound and fluoroscopic image documentation was performed). The needle was  removed over an 018 wire which was exchanged for a Amplatz.  This was advanced into the IVC.  An 8-French dilator was advanced over the Amplatz.  A small incision was made in the left upper chest over the anterior left second rib.  Utilizing blunt dissection, a subcutaneous pocket was created in the caudal direction. The pocket was irrigated with a copious amount of sterile normal saline.  The port catheter was tunneled from the chest incision, and out the neck incision.  The reservoir was inserted into the subcutaneous pocket and secured with two 3-0 Ethilon stitches.  A peel-away sheath was advanced over the Amplatz wire.  The port catheter was cut to measure length and inserted through the peel-away sheath.  The peel-away sheath was removed.  The chest incision was closed with 3-0 Vicryl interrupted stitches for the subcutaneous tissue and a running of 4- 0 Vicryl subcuticular stitch for the skin.  The neck incision was closed with a 4-0 Vicryl subcuticular stitch.  Derma-bond was applied to both surgical incisions.  The port reservoir was flushed and instilled with heparinized saline.  No complications.  Findings:  A left IJ vein Port-A-Cath is in place with its tip at the cavoatrial junction.  IMPRESSION: Successful 8 French left internal jugular vein power port placement with its tip at the SVC/RA junction.   Original Report Authenticated By: Jolaine Click, M.D.   Mm Digital Diagnostic Unilat L  01/09/2013   *RADIOLOGY REPORT*  Clinical Data:  Evaluate clip placement following two separate ultrasound-guided left breast biopsies.  DIGITAL DIAGNOSTIC LEFT MAMMOGRAM  Comparison:  Previous exams  Findings:  Films are performed following two separate ultrasound guided biopsies of the left breast - at the 11 o'clock position and 2 o'clock position. The ribbon shaped biopsy clip corresponds to the 11 o'clock mass and the T shaped biopsy clip corresponds to the 2 o'clock mass. No immediate complications are  identified.  IMPRESSION: Satisfactory clip placement following ultrasound guided left breast biopsies.  Pathology will be followed.   Original Report Authenticated By: Harmon Pier, M.D.   Mm Digital Diagnostic Unilat L  01/09/2013   *RADIOLOGY REPORT*  Clinical Data:  47 year old female with newly diagnosed right breast cancer.  Left mammogram to compare to presurgical MRI.  DIGITAL DIAGNOSTIC LEFT MAMMOGRAM WITH CAD AND LEFT BREAST ULTRASOUND:  Comparison:  06/11/2012 mammogram and 01/09/2013 MRI  Findings:  ACR Breast Density Category 3: The breast tissue is heterogeneously dense.  A possible circumscribed mass in the upper outer left breast is identified. No suspicious calcifications or distortion identified.  Mammographic images were processed with CAD.  On physical exam, no palpable abnormalities identified within the upper left breast.  Ultrasound is performed, showing a 6 x 6 x 8 mm slightly irregular hypoechoic mass at the 11 o'clock position of the left breast 5 cm from the nipple An 11 x 7 x 9 mm slightly irregular hypoechoic mass at the 2 o'clock position of the left breast 4 cm from the nipple is also present. No enlarged or abnormal appearing left axillary lymph nodes are identified.  IMPRESSION: Indeterminate 6 x 6 x 8 mm hypoechoic mass at the 11 o'clock position of the left breast and 11 x 7 x 9 mm hypoechoic mass in the 2 o'clock position of the left breast.  The 11 o'clock position mass probably represents the enhancing nodule identified on today's MRI.  Tissue sampling of both masses is recommended.  BI-RADS CATEGORY 4:  Suspicious abnormality - biopsy should be considered.  RECOMMENDATION: Ultrasound guided biopsies of both left breast masses.  The findings and recommendations were discussed with the patient and she desires to proceed with ultrasound guided left breast biopsies. These biopsies will be performed today but dictated in a separate report.  Results were also provided in writing at  the conclusion of the visit.  If applicable, a reminder letter will be sent to the patient regarding the next appointment.   Original Report Authenticated By: Harmon Pier, M.D.   [place] Lt Breast Bx W Loc Dev 1st Lesion Img Bx Spec US Guide  01/12/2013   *RADIOLOGY REPORT*  Clinical Data:  47 year old female with newly diagnosed right breast cancer.  Two left breast masses identified sonographically - 6 x 8 mm at the 11 o'clock position and 9 x 11 mm at the 2 o'clock position. For tissue sampling of the 6 x 8 mm mass at the 11 o'clock position.  ULTRASOUND GUIDED VACUUM ASSISTED CORE BIOPSY OF THE LEFT BREAST  Comparison: Previous exams.  I met with the patient and we discussed the procedure of ultrasound- guided biopsy, including benefits and alternatives.  We discussed the high likelihood of a successful procedure. We discussed the risks of the procedure including infection, bleeding, tissue injury, clip migration, and inadequate sampling.  Informed written consent was given.  Using sterile technique and 2% Lidocaine as local anesthetic, under direct ultrasound visualization, a12 gauge vacuum-assisteddevice was used to perform biopsy of the 6 x 8 mm hypoechoic mass at the 11 o'clock position of the left breast 5 cm from the nipple using a medial approach. At the conclusion of the procedure, a ribbon shaped tissue marker clip was deployed into the biopsy cavity. Follow-up 2-view mammogram was performed and dictated separately.  The usual time-out protocol was performed immediately prior to the procedure.  IMPRESSION: Ultrasound-guided biopsy of 6 x 8 mm hypoechoic mass at the 11 o'clock position of the left breast.  No apparent complications.  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  Histology correlates with imaging findings.  The patient was contacted by phone on 01/12/2013 and these results given to her which she understood. Her questions were answered.  The patient had no complaints with her biopsy site.  Recommend treatment plan.   Original Report Authenticated By: Harmon Pier, M.D.   [place] Lt Breast Bx W Loc Dev Ea Add Lesion Img Bx Spec US Guide  01/12/2013   *RADIOLOGY REPORT*  Clinical Data:  47 year old female with newly diagnosed right breast cancer.  Two left breast masses identified sonographically - 6 x 8 mm at the 11 o'clock position and 9 x 11 mm at the 2 o'clock position. For tissue sampling of the 9 x 11 mm mass at the 2 o'clock position.  ULTRASOUND GUIDED VACUUM ASSISTED CORE BIOPSY OF THE LEFT BREAST  Comparison: Previous exams.  I met with the patient and we discussed the procedure of ultrasound- guided biopsy, including benefits and alternatives.  We discussed the high likelihood of a successful procedure. We discussed the risks of the procedure including infection, bleeding, tissue injury, clip migration, and inadequate sampling.  Informed written consent was given.  Using sterile technique and 2% Lidocaine as local anesthetic, under direct ultrasound visualization, a 12 gauge vacuum-assisteddevice was used to perform biopsy of the 9 x 11 mm hypoechoic mass at the 2 o'clock position of the left breast 4 cm from the nipple using a medial approach. At the conclusion of the procedure, a  tissue marker clip was deployed into the biopsy cavity.  Follow-up 2-view mammogram was performed and dictated separately.  The usual time-out protocol was performed immediately prior to the procedure.  IMPRESSION: Ultrasound-guided biopsy of 9 x 11 mm mass in the 2 o'clock position of the left breast.  No apparent complications.  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 Histology correlates with imaging findings.  The patient was contacted by phone on 01/12/2013 and these results given to her which she understood. Her questions were answered. The patient had no complaints with her biopsy site.  Recommend  treatment plan.   Original Report Authenticated By: Harmon Pier, M.D.    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 (T3 N1) Patient was administered adjuvant chemotherapy consisting of Taxotere carboplatinum Herceptin and perjeta x6 cycles from 01/16/2013 through 05/01/2013. Overall she tolerated it well.  #2 patient is now status post bilateral mastectomies on 06/09/2013 with the final pathology revealing no evidence of residual carcinoma in the right breast 14 lymph nodes were negative for metastatic disease. With the final pathologic staging at T0 N0 with complete pathologic response to chemotherapy.  #3 patient to proceed with adjuvant Herceptin every 3 weeks to complete one year. She is followed by cardiology as well for ongoing echocardiograms and evaluation for Herceptin induced cardio toxicity  #4 patient will not receive antiestrogen therapy since her tumor is estrogen receptor and progesterone receptor negative  PLAN:  #1 proceed with Herceptin today.  #2 she will return in 3 weeks' time for followup   All questions were answered. The patient knows to call the clinic with any problems, questions or concerns. We can certainly see the patient much sooner if necessary.  I spent 25 minutes counseling the patient face to face. The total time spent in the appointment was 30 minutes.    Drue Second, MD Medical/Oncology [facility] [phone] (beeper) [phone] (Office)

## 2013-06-16 NOTE — Telephone Encounter (Signed)
sw pt made her aware that her arrival time for 07/07/13 has changed. gv appt for 07/07/13 w/l abs @ 9:45am and ov @ 10:15am.the patient is aware...td

## 2013-06-16 NOTE — Patient Instructions (Signed)
Proceed with herceptin today  We will see you back in 3 weeks 

## 2013-06-16 NOTE — Telephone Encounter (Signed)
appts made and printed. Pt is aware that i will give her a call for appt to see Jasmine Schwartz....td

## 2013-06-17 ENCOUNTER — Telehealth: Payer: Self-pay | Admitting: Oncology

## 2013-06-17 NOTE — Telephone Encounter (Signed)
Faxed pt medical records to [facility] °

## 2013-06-18 ENCOUNTER — Encounter (INDEPENDENT_AMBULATORY_CARE_PROVIDER_SITE_OTHER): Payer: Self-pay | Admitting: General Surgery

## 2013-06-18 ENCOUNTER — Ambulatory Visit (INDEPENDENT_AMBULATORY_CARE_PROVIDER_SITE_OTHER): Payer: BC Managed Care – PPO | Admitting: General Surgery

## 2013-06-18 ENCOUNTER — Other Ambulatory Visit: Payer: Self-pay

## 2013-06-18 VITALS — BP 146/70 | HR 68 | Temp 98.0°F | Resp 14 | Ht 63.0 in | Wt 149.6 lb

## 2013-06-18 DIAGNOSIS — E109 Type 1 diabetes mellitus without complications: Secondary | ICD-10-CM

## 2013-06-18 DIAGNOSIS — C50911 Malignant neoplasm of unspecified site of right female breast: Secondary | ICD-10-CM

## 2013-06-18 DIAGNOSIS — C50919 Malignant neoplasm of unspecified site of unspecified female breast: Secondary | ICD-10-CM

## 2013-06-18 NOTE — Patient Instructions (Signed)
You appear to be recovering from the bilateral mastectomy without any obvious surgical complications. We removed one drain on the right. We discussed the pathology report, which is excellent.  Continue to take a walk everyday and stay well hydrated.  Return to see Dr. Derrell Lolling in one week for a drain check.

## 2013-06-18 NOTE — Progress Notes (Signed)
Patient ID: Jasmine Schwartz, female   DOB: 05-20-1966, 47 y.o.   MRN: [id number]  History: This patient returns for her first postop visit. She initially presented with a locally advanced cancer in the right breast, clinical stage T3, N1, receptor negative, HER-2/neu-positive. Initial breast and axillary biopsies were positive.She had 2 small densities in the left breast on MRI which were biopsied and showed fibroadenomatous. PET CT showed no systemic metastasis. Port-A-Cath was placed and she was given neoadjuvant chemotherapy. She had excellent response. On 06/09/2013 she underwent right modified radical mastectomy and left total mastectomy. She had a complete pathologic response in the right breast. 0/14 lymph nodes positive.  YpT0, ypN0.   Left breast was benign. She knows that she will need to complete one year of Herceptin based chemotherapy. She is doing fairly well. Sleeping well. Minimal pain. Right axillary drain is draining a moderate amount of clear serous fluid. The skin flap drains on the right and the left have less drainage. Less than 30 cc per day.   Exam: Patient looks well. No distress. Husband is with her. Bilateral mastectomy skin flaps looked great. Healthy skin. No sign of infection. No sign of residual fluid. I removed the skin flap drain on the right but left the other 2 drains. Redressed.    Assessment: Locally advanced right breast cancer, receptor-negative, HER-2 positive, pretreatment clinical stage T3, N1  Abnormal MRI left breast, status post biopsy at 11:00 and 2:00 position revealing benign fibroadenomas  Doing well following recent bilateral total mastectomy and right axillary lymph node dissection. Complete pathologic response in right breast. No malignancy seen in left breast either. 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 Return to see me in one week for drain check. Hopefully the drains can come out soon. Refer to physical therapy after drains out. Complete one  month course of her Herceptin based chemotherapy Ultimately, consideration will need to be given to right chest wall radiation therapy. She has seen Dr. Michell Heinrich. Plastic surgery referral and Port-A-Cath removal after all adjuvant therapy is completed.       Angelia Mould. Derrell Lolling, M.D., [facility] General and Minimally invasive Surgery Breast and Colorectal Surgery Office:   [phone] Pager:   [phone]

## 2013-06-23 ENCOUNTER — Encounter (INDEPENDENT_AMBULATORY_CARE_PROVIDER_SITE_OTHER): Payer: BC Managed Care – PPO | Admitting: General Surgery

## 2013-06-25 ENCOUNTER — Encounter (INDEPENDENT_AMBULATORY_CARE_PROVIDER_SITE_OTHER): Payer: Self-pay | Admitting: General Surgery

## 2013-06-25 ENCOUNTER — Ambulatory Visit (INDEPENDENT_AMBULATORY_CARE_PROVIDER_SITE_OTHER): Payer: BC Managed Care – PPO | Admitting: General Surgery

## 2013-06-25 VITALS — BP 100/70 | HR 80 | Temp 99.1°F | Resp 15 | Ht 63.0 in | Wt 147.6 lb

## 2013-06-25 DIAGNOSIS — C50911 Malignant neoplasm of unspecified site of right female breast: Secondary | ICD-10-CM

## 2013-06-25 DIAGNOSIS — C50919 Malignant neoplasm of unspecified site of unspecified female breast: Secondary | ICD-10-CM

## 2013-06-25 NOTE — Progress Notes (Signed)
Patient ID: Jasmine Schwartz, female   DOB: 1966/05/08, 47 y.o.   MRN: [id number]  History:  This patient returns for her second postop visit.  She initially presented with a locally advanced cancer in the right breast, clinical stage T3, N1, receptor negative, HER-2/neu-positive. Initial breast and axillary biopsies were positive.She had 2 small densities in the left breast on MRI which were biopsied and showed fibroadenomatous. PET CT showed no systemic metastasis. Port-A-Cath was placed and she was given neoadjuvant chemotherapy. She had excellent response.  On 06/09/2013 she underwent right modified radical mastectomy and left total mastectomy. She had a complete pathologic response in the right breast. 0/14 lymph nodes positive. YpT0, ypN0. Left breast was benign.  She knows that she will need to complete one year of Herceptin based chemotherapy.  She is doing fairly well. Sleeping well. Minimal pain. The remaining drains have subsided.   Exam:  Patient looks well. No distress. Husband is with her.  Bilateral mastectomy skin flaps looked great. Healthy skin. No sign of infection. No sign of residual fluid. I removed the Remaining 2 drains.  Assessment: Locally advanced right breast cancer, receptor-negative, HER-2 positive, pretreatment clinical stage T3, N1  Abnormal MRI left breast, status post biopsy at 11:00 and 2:00 position revealing benign fibroadenomas  Doing well following recent bilateral total mastectomy and right axillary lymph node dissection.  Complete pathologic response in right breast. No malignancy seen in left breast either.  Type 1 - IDDM on insulin pump  Fluid retention, followed by cardiology  Anxiety and depression  Hyperlipidemia   Plan:  Diet and activities, shoulder exercises  discussed.  Drains out, referred to physical therapy See me in one month, sooner if there is any seroma accumulation. Prescription for postmastectomy bra and prosthesis given to patient. .   Complete  course of her Herceptin based chemotherapy  Ultimately, consideration will need to be given to right chest wall radiation therapy. She has seen Dr. Michell Heinrich.  Plastic surgery referral and Port-A-Cath removal after all adjuvant therapy is completed.   Angelia Mould. Derrell Lolling, M.D., [facility] General and Minimally invasive Surgery Breast and Colorectal Surgery Office:   [phone] Pager:   [phone]

## 2013-06-25 NOTE — Patient Instructions (Signed)
Your mastectomy wounds are healing normally. We were removed both drains today.  You may take a shower starting tomorrow.  You will be referred for a physical therapy class.  Return to see Dr. Derrell Lolling in one month. Call if you develop any fluid collections.

## 2013-06-26 ENCOUNTER — Other Ambulatory Visit (INDEPENDENT_AMBULATORY_CARE_PROVIDER_SITE_OTHER): Payer: Self-pay | Admitting: *Deleted

## 2013-06-26 MED ORDER — UNABLE TO FIND: Status: DC

## 2013-07-01 ENCOUNTER — Other Ambulatory Visit (INDEPENDENT_AMBULATORY_CARE_PROVIDER_SITE_OTHER): Payer: Self-pay | Admitting: *Deleted

## 2013-07-01 MED ORDER — UNABLE TO FIND: Status: DC

## 2013-07-06 ENCOUNTER — Telehealth (INDEPENDENT_AMBULATORY_CARE_PROVIDER_SITE_OTHER): Payer: Self-pay

## 2013-07-06 NOTE — Telephone Encounter (Signed)
I called the pt to ask if she can come in at 0815 on 12/11.  Dr Derrell Lolling has a surgery right after clinic.  She agreed and I changed her appointment time.

## 2013-07-07 ENCOUNTER — Telehealth: Payer: Self-pay | Admitting: *Deleted

## 2013-07-07 ENCOUNTER — Other Ambulatory Visit (HOSPITAL_BASED_OUTPATIENT_CLINIC_OR_DEPARTMENT_OTHER): Payer: BC Managed Care – PPO | Admitting: Lab

## 2013-07-07 ENCOUNTER — Other Ambulatory Visit: Payer: BC Managed Care – PPO | Admitting: Lab

## 2013-07-07 ENCOUNTER — Ambulatory Visit (HOSPITAL_BASED_OUTPATIENT_CLINIC_OR_DEPARTMENT_OTHER): Payer: BC Managed Care – PPO | Admitting: Adult Health

## 2013-07-07 ENCOUNTER — Encounter: Payer: Self-pay | Admitting: Adult Health

## 2013-07-07 ENCOUNTER — Ambulatory Visit (HOSPITAL_BASED_OUTPATIENT_CLINIC_OR_DEPARTMENT_OTHER): Payer: BC Managed Care – PPO

## 2013-07-07 ENCOUNTER — Encounter: Payer: Self-pay | Admitting: Oncology

## 2013-07-07 VITALS — BP 106/70 | HR 91 | Temp 97.8°F | Resp 18 | Wt 148.1 lb

## 2013-07-07 DIAGNOSIS — C50911 Malignant neoplasm of unspecified site of right female breast: Secondary | ICD-10-CM

## 2013-07-07 DIAGNOSIS — Z5112 Encounter for antineoplastic immunotherapy: Secondary | ICD-10-CM

## 2013-07-07 DIAGNOSIS — C773 Secondary and unspecified malignant neoplasm of axilla and upper limb lymph nodes: Secondary | ICD-10-CM

## 2013-07-07 DIAGNOSIS — G589 Mononeuropathy, unspecified: Secondary | ICD-10-CM

## 2013-07-07 DIAGNOSIS — Z171 Estrogen receptor negative status [ER-]: Secondary | ICD-10-CM

## 2013-07-07 DIAGNOSIS — G629 Polyneuropathy, unspecified: Secondary | ICD-10-CM

## 2013-07-07 DIAGNOSIS — C50419 Malignant neoplasm of upper-outer quadrant of unspecified female breast: Secondary | ICD-10-CM

## 2013-07-07 DIAGNOSIS — C50919 Malignant neoplasm of unspecified site of unspecified female breast: Secondary | ICD-10-CM

## 2013-07-07 LAB — COMPREHENSIVE METABOLIC PANEL (CC13)
ALT: 11 U/L (ref 0–55)
AST: 17 U/L (ref 5–34)
Albumin: 3.3 g/dL — ABNORMAL LOW (ref 3.5–5.0)
Alkaline Phosphatase: 53 U/L (ref 40–150)
Anion Gap: 9 mEq/L (ref 3–11)
Calcium: 9 mg/dL (ref 8.4–10.4)
Chloride: 105 mEq/L (ref 98–109)
Creatinine: 0.7 mg/dL (ref 0.6–1.1)
Sodium: 140 mEq/L (ref 136–145)
Total Bilirubin: 0.38 mg/dL (ref 0.20–1.20)
Total Protein: 6.6 g/dL (ref 6.4–8.3)

## 2013-07-07 LAB — CBC WITH DIFFERENTIAL/PLATELET
BASO%: 0.2 % (ref 0.0–2.0)
Basophils Absolute: 0 10*3/uL (ref 0.0–0.1)
EOS%: 2.7 % (ref 0.0–7.0)
HCT: 35.1 % (ref 34.8–46.6)
LYMPH%: 33.2 % (ref 14.0–49.7)
MCH: 29.2 pg (ref 25.1–34.0)
MCHC: 32.8 g/dL (ref 31.5–36.0)
MCV: 89.1 fL (ref 79.5–101.0)
MONO%: 8.5 % (ref 0.0–14.0)
NEUT%: 55.4 % (ref 38.4–76.8)
Platelets: 247 10*3/uL (ref 145–400)
RBC: 3.94 10*6/uL (ref 3.70–5.45)
WBC: 5.9 10*3/uL (ref 3.9–10.3)

## 2013-07-07 MED ORDER — DIPHENHYDRAMINE HCL 25 MG PO CAPS
ORAL_CAPSULE | ORAL | Status: AC
  Filled 2013-07-07: qty 2

## 2013-07-07 MED ORDER — SODIUM CHLORIDE 0.9 % IV SOLN
Freq: Once | INTRAVENOUS | Status: AC
  Administered 2013-07-07: 11:00:00 via INTRAVENOUS

## 2013-07-07 MED ORDER — ACETAMINOPHEN 325 MG PO TABS
650.0000 mg | ORAL_TABLET | Freq: Once | ORAL | Status: AC
  Administered 2013-07-07: 650 mg via ORAL

## 2013-07-07 MED ORDER — SODIUM CHLORIDE 0.9 % IJ SOLN
10.0000 mL | INTRAMUSCULAR | Status: DC | PRN
  Administered 2013-07-07: 10 mL
  Filled 2013-07-07: qty 10

## 2013-07-07 MED ORDER — TRASTUZUMAB CHEMO INJECTION 440 MG
6.0000 mg/kg | Freq: Once | INTRAVENOUS | Status: AC
  Administered 2013-07-07: 399 mg via INTRAVENOUS
  Filled 2013-07-07: qty 19

## 2013-07-07 MED ORDER — ACETAMINOPHEN 325 MG PO TABS
ORAL_TABLET | ORAL | Status: AC
  Filled 2013-07-07: qty 2

## 2013-07-07 MED ORDER — GABAPENTIN 100 MG PO CAPS: 200.0000 mg | ORAL_CAPSULE | Freq: Two times a day (BID) | ORAL | Status: DC

## 2013-07-07 MED ORDER — HEPARIN SOD (PORK) LOCK FLUSH 100 UNIT/ML IV SOLN
500.0000 [IU] | Freq: Once | INTRAVENOUS | Status: AC | PRN
  Administered 2013-07-07: 500 [IU]
  Filled 2013-07-07: qty 5

## 2013-07-07 MED ORDER — DIPHENHYDRAMINE HCL 25 MG PO CAPS
50.0000 mg | ORAL_CAPSULE | Freq: Once | ORAL | Status: AC
  Administered 2013-07-07: 50 mg via ORAL

## 2013-07-07 NOTE — Progress Notes (Signed)
OFFICE PROGRESS NOTE  CC  Jasmine Belling, MD [address] [place] [address] Dr. Lurline Hare  Dr. 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 At that time she had mammogram performed and it was negative. However her breast continue to increase in size. Initially patient felt that it could have been cysts or something on and therefore she did not go to a physician. However subsequently patient's breast became quite painful and she also began to feel in knot under her right arm.because of this she went to her physician who did a physical exam and a mammogram was performed on 01/01/2013 the mammogram showed diffuse skin thickening generalized increased density within the right breast and enlarged right axillary lymph nodes. She went on to have an ultrasound of the right breast performed that showed a large spiculated hypoechoic mass in the right breast 11:00 2 cm from the nipple measuring at least 4.5 cm in size. There were abnormal lymph nodes with thickened cortex and the right axilla the largest one measuring 3.1 cm in dimension.  #2Patient was recommended ultrasound guided core biopsy of the right breast mass and the right axilla lymph node. The biopsy was performed on 01/01/2013. The right needle core biopsy of the primary mass showed invasive mammary carcinoma grade 2-3 was invasive ductal. The lymph node of the right axilla was positive for metastatic disease. The tumor was ER PR negative HER-2/neu positive with a Ki-67 of 76%. On 01/09/2013 patient had MRI of the breasts performed that showed diffuse right breast skin thickening and edema with right axillary lymph node. There was a 5 mm internal mammary lymph node that  was incompletely imaged. The right breast mass measured 8 x 5 x 4.7 cm. There were abnormal areas involving all 4 quadrants. There was however no involvement of the pectoralis minor for lower chest wall. The left breast showed 2 areas of nodular enhancement these were biopsied and were fibroadenomas  #3 it was recommended that patient begin neoadjuvant chemotherapy consisting of Taxotere carboplatinum Herceptin and perjeta. This would be given every 3 weeks with day 2 Neulasta. A total of 6 cycles of therapy were planned. Once she completes the treatment then she would proceed with MRI of the breasts again for evaluation a response to therapy and eventually a mastectomy.  #4 patient is status post Neoadjuvant with curative intentTaxotere carboplatinum Herceptin and perjeta starting 01/16/2013 - 05/01/2013.  #5 patient is status post bilateral mastectomies with the left breast revealing no evidence of cancer. Right breast reveals no residual carcinoma 14 right axillary lymph nodes were negative for metastatic disease.  #6 patient began adjuvant Herceptin starting 06/16/2013. Patient will complete out 1 year of adjuvant Herceptin.  CURRENT THERAPY:  Adjuvant herceptin beginning 06/16/13 INTENT: Curative  INTERVAL HISTORY: Jasmine Schwartz 47 y.o. female returns for followup visit today. She is doing well today.  Her nails are starting to come off.  She continues to have difficulty with neuropathy.  She is taking Gabapentin 100mg  BID.  Otherwise, she denies fevers, chills, nausea, vomiting, chest pain, palpitations, DOE, PND, orthopnea, or any further concerns.   MEDICAL HISTORY: Past Medical History  Diagnosis Date  . Anxiety   . Depression   . Dyslipidemia   .  Anxiety and depression 07/06/2011  . Hx of insertion of insulin pump ~ 2001 to present (06/09/2013)  . PONV (postoperative nausea and vomiting)   . Venous insufficiency   . Lower extremity edema   . Diabetes mellitus     IDDM  . Type I  diabetes mellitus     uses insulin pump  . Breast cancer 01/01/13    /metastatic 1/1 lymph nodes    ALLERGIES:  has No Known Allergies.  MEDICATIONS:  Current Outpatient Prescriptions  Medication Sig Dispense Refill  . gabapentin (NEURONTIN) 100 MG capsule Take 2 capsules (200 mg total) by mouth 2 (two) times daily.  120 capsule  6  . glucose blood (NOVA MAX TEST) test strip Use as directed up to 8 times a day      . Insulin Infusion Pump (MINIMED INSULIN PUMP) DEVI Inject 1 Units/hr into the vein continuous. Pt give extra per cards at meal times. Basic meals are about 5 units      . Insulin Infusion Pump Supplies (MINIMED INFUSION SET-MMT 397) MISC 1 Device by Does not apply route every 3 (three) days.      . Insulin Infusion Pump Supplies (PARADIGM PUMP RESERVOIR 1.76ML) MISC 1 Device by Does not apply route every 3 (three) days.      . insulin lispro (HUMALOG) 100 UNIT/ML injection Inject as directed into Insulin pump, total of 40 units/day  20 mL  2  . LORazepam (ATIVAN) 0.5 MG tablet Take 0.5 mg by mouth every 8 (eight) hours as needed for anxiety.      . cyanocobalamin 500 MCG tablet Take 500 mcg by mouth daily.      . furosemide (LASIX) 20 MG tablet Take 20 mg by mouth daily as needed for fluid.      Marland Kitchen ondansetron (ZOFRAN) 8 MG tablet       . potassium chloride SA (K-DUR,KLOR-CON) 20 MEQ tablet Take 20 mEq by mouth daily as needed (when taking Furosemide).      Marland Kitchen UNABLE TO FIND Rx: R6045- Mastectomy Form, Bilateral (Quantity: 2) L8000- Mastectomy Bra (Quantity: 2) Dx: 174.9; Bilateral Mastectomy  1 each  0  . UNABLE TO FIND Rx: [id number]- Post Mastectomy Garment (Quantity: 2) Dx: 174.9; Bilateral Mastectomy  1 each  0   No current facility-administered medications for this visit.   Facility-Administered Medications Ordered in Other Visits  Medication Dose Route Frequency Provider Last Rate Last Dose  . sodium chloride 0.9 % injection 10 mL  10 mL Intracatheter PRN Victorino December, MD    10 mL at 07/07/13 1241    SURGICAL HISTORY:  Past Surgical History  Procedure Laterality Date  . Lumbar disc surgery  2009    Dr Jillyn Hidden  . Cesarean section  1989  . Tubal ligation  1991  . Breast biopsy Right 01/01/13    Invasive mammary ca,metastatic in 1/1 lymph node  . Breast biopsy Left 01/09/13    Fibroadenoma,microcalcifications  . Portacath placement Left 01/2013  . Mastectomy modified radical Right 06/09/2013  . Mastectomy complete / simple Left 06/09/2013  . Mastectomy modified radical Right 06/09/2013    Procedure: RIGHT MASTECTOMY MODIFIED RADICAL;  Surgeon: Ernestene Mention, MD;  Location: [facility] OR;  Service: General;  Laterality: Right;  . Simple mastectomy with axillary sentinel node biopsy Left 06/09/2013    Procedure: LEFT SIMPLE MASTECTOMY;  Surgeon: Ernestene Mention, MD;  Location: [facility] OR;  Service: General;  Laterality: Left;    REVIEW OF SYSTEMS:  A  10 point review of systems was conducted and is otherwise negative except for what is noted above.    PHYSICAL EXAMINATION: Blood pressure 106/70, pulse 91, temperature 97.8 F (36.6 C), temperature source Oral, resp. rate 18, weight 148 lb 2.4 oz (67.2 kg). Body mass index is 26.25 kg/(m^2). General: Patient is a well appearing female in no acute distress HEENT: PERRLA, sclerae anicteric no conjunctival pallor, MMM Neck: supple, no palpable adenopathy Lungs: clear to auscultation bilaterally, no wheezes, rhonchi, or rales Cardiovascular: regular rate rhythm, S1, S2, no murmurs, rubs or gallops Abdomen: Soft, non-tender, non-distended, normoactive bowel sounds, no HSM Extremities: warm and well perfused, no clubbing, cyanosis, or edema Skin: No rashes or lesions Neuro: Non-focal Breasts: Deferred ECOG PERFORMANCE STATUS: 0 - Asymptomatic  LABORATORY DATA: Lab Results  Component Value Date   WBC 5.9 07/07/2013   HGB 11.5* 07/07/2013   HCT 35.1 07/07/2013   MCV 89.1 07/07/2013   PLT 247 07/07/2013       Chemistry      Component Value Date/Time   NA 140 07/07/2013 0951   NA 136 06/10/2013 0600   K 4.2 07/07/2013 0951   K 3.5 06/10/2013 0600   CL 104 06/10/2013 0600   CL 99 01/23/2013 1251   CO2 26 07/07/2013 0951   CO2 26 06/10/2013 0600   BUN 14.1 07/07/2013 0951   BUN 9 06/10/2013 0600   CREATININE 0.7 07/07/2013 0951   CREATININE 0.64 06/10/2013 0600      Component Value Date/Time   CALCIUM 9.0 07/07/2013 0951   CALCIUM 8.5 06/10/2013 0600   ALKPHOS 53 07/07/2013 0951   ALKPHOS 47 05/29/2013 0858   AST 17 07/07/2013 0951   AST 24 05/29/2013 0858   ALT 11 07/07/2013 0951   ALT 16 05/29/2013 0858   BILITOT 0.38 07/07/2013 0951   BILITOT 0.3 05/29/2013 0858     Pathology 06/09/2013:  Diagnosis 1. Breast, simple mastectomy, Left - FIBROCYSTIC CHANGE WITH ADENOSIS AND CALCIFICATIONS. - NO MALIGNANCY IDENTIFIED. 2. Breast, modified radical mastectomy , Right - BENIGN BREAST TISSUE WITH EXTENSIVE FIBROSIS CONSISTENT WITH TREATMENT EFFECT. - NO RESIDUAL CARCINOMA IDENTIFIED. - FOURTEEN LYMPH NODES NEGATIVE FOR CARCINOMA WITH FOCAL HYLANIZATION (0/14). - SEE ONCOLOGY TABLE AND COMMENT. Microscopic Comment 2. BREAST, INVASIVE TUMOR, WITH LYMPH NODE SAMPLING Specimen, including laterality and lymph node sampling (sentinel, non-sentinel): Right breast and axillary contents. Procedure: Right modified radical mastectomy. Histologic type, tumor grade, tumor size, margins, lympovascular invasion, tumor focality, and extent of tumor can not be assessed, as there is no residual carcinoma. Ductal carcinoma in situ: Absent. Lobular neoplasia: Absent. Treatment effect: Present, extensive. If present, treatment effect in breast tissue, lymph nodes or both: Both. Lymph nodes: Examined: 14 Total Lymph nodes with metastasis: 0 Isolated tumor cells (< 0.2 mm): 0 Micrometastasis: (> 0.2 mm and < 2.0 mm): 0 Macrometastasis: (> 2.0 mm): 0 Extracapsular extension: 0 Breast prognostic  profile: [id number] Estrogen receptor: Negative. Progesterone receptor: Negative. Her 2 neu: Amplified. Ki-67: 76% Non-neoplastic breast: Fibrocystic change. 1 of 3 FINAL for ANAYA, BOVEE [phone]) Microscopic Comment(continued) TNM: ypT0, ypN0 Comments: There is no residual tumor in the current breast specimen, status post neoadjuvant chemotherapy, and therefore it is best staged as ypT0. Many of the nodes exhibit fibrosis consistent with treatment effect. Immunohistochemistry for pancytokeratin was performed on the two largest lymph nodes and was negative for carcinoma, resulting in a stage of ypN0. Valinda Hoar MD Pathologist, Electronic Signature (Case signed 06/11/2013) Specimen Gross and Clinical  RADIOGRAPHIC STUDIES:  Ct Chest W Contrast  01/20/2013   *RADIOLOGY REPORT*  Clinical Data:  High risk breast cancer.  Evaluate for metastatic disease.  CT CHEST, ABDOMEN AND PELVIS WITH CONTRAST  Technique:  Multidetector CT imaging of the chest, abdomen and pelvis was performed following the standard protocol during bolus administration of intravenous contrast.  Contrast: OMNIPAQUE IOHEXOL 300 MG/ML  SOLN  Comparison:  None  CT CHEST  Findings:  Lungs/pleura: There is no pleural effusion.  No suspicious pulmonary nodule or mass noted.  Heart/Mediastinum: Heart size is normal.  No pericardial effusion. No mediastinal or hilar adenopathy.  Bones/Musculoskeletal:  Asymmetric increased soft tissue within the right breast parenchyma corresponding to patient's biopsy-proven invasive breast carcinoma.  There is associated overlying skin thickening.  Multiple enlarged right axillary lymph nodes are identified.  Index lymph node measures 1.7 cm, image 23/series 2. Several enhancing, sub centimeter retropectoral lymph nodes are identified.  IMPRESSION:  1.  No acute findings. 2.  Right breast invasive carcinoma with associated right axillary adenopathy.  CT ABDOMEN AND PELVIS  Findings:  There  are no focal liver abnormalities identified.  The gallbladder appears normal.  There is no biliary dilatation.  The pancreas is unremarkable.  Normal appearance of the spleen.  The adrenal glands are both normal.  The kidneys are unremarkable. Urinary bladder appears normal.  The uterus is unremarkable. Corpus luteal cyst is noted in the left ovary measuring 1.6 cm.  No free fluid or fluid collections identified within the upper abdomen or pelvis.  The abdominal aorta has a normal caliber. There is no adenopathy identified within the upper abdomen.  No pelvic or inguinal adenopathy noted.  The stomach is normal.  The small bowel loops are unremarkable. Normal appearance of the colon.  Review of the visualized osseous structures is negative for aggressive lytic or sclerotic bone lesion.  IMPRESSION:  1.  No acute findings within the abdomen or pelvis. 2.  No mass or adenopathy.   Original Report Authenticated By: Signa Kell, M.D.   US Breast Left  01/09/2013   *RADIOLOGY REPORT*  Clinical Data:  47 year old female with newly diagnosed right breast cancer.  Left mammogram to compare to presurgical MRI.  DIGITAL DIAGNOSTIC LEFT MAMMOGRAM WITH CAD AND LEFT BREAST ULTRASOUND:  Comparison:  06/11/2012 mammogram and 01/09/2013 MRI  Findings:  ACR Breast Density Category 3: The breast tissue is heterogeneously dense.  A possible circumscribed mass in the upper outer left breast is identified. No suspicious calcifications or distortion identified.  Mammographic images were processed with CAD.  On physical exam, no palpable abnormalities identified within the upper left breast.  Ultrasound is performed, showing a 6 x 6 x 8 mm slightly irregular hypoechoic mass at the 11 o'clock position of the left breast 5 cm from the nipple An 11 x 7 x 9 mm slightly irregular hypoechoic mass at the 2 o'clock position of the left breast 4 cm from the nipple is also present. No enlarged or abnormal appearing left axillary lymph nodes  are identified.  IMPRESSION: Indeterminate 6 x 6 x 8 mm hypoechoic mass at the 11 o'clock position of the left breast and 11 x 7 x 9 mm hypoechoic mass in the 2 o'clock position of the left breast.  The 11 o'clock position mass probably represents the enhancing nodule identified on today's MRI.  Tissue sampling of both masses is recommended.  BI-RADS CATEGORY 4:  Suspicious abnormality - biopsy should be considered.  RECOMMENDATION: Ultrasound guided biopsies of both left  breast masses.  The findings and recommendations were discussed with the patient and she desires to proceed with ultrasound guided left breast biopsies. These biopsies will be performed today but dictated in a separate report.  Results were also provided in writing at the conclusion of the visit.  If applicable, a reminder letter will be sent to the patient regarding the next appointment.   Original Report Authenticated By: Harmon Pier, M.D.   Ct Abdomen Pelvis W Contrast  01/22/2013   *RADIOLOGY REPORT*  Clinical Data:  High risk breast cancer.  Evaluate for metastatic disease.  CT CHEST, ABDOMEN AND PELVIS WITH CONTRAST  Technique:  Multidetector CT imaging of the chest, abdomen and pelvis was performed following the standard protocol during bolus administration of intravenous contrast.  Contrast: OMNIPAQUE IOHEXOL 300 MG/ML  SOLN  Comparison:  None  CT CHEST  Findings:  Lungs/pleura: There is no pleural effusion.  No suspicious pulmonary nodule or mass noted.  Heart/Mediastinum: Heart size is normal.  No pericardial effusion. No mediastinal or hilar adenopathy.  Bones/Musculoskeletal:  Asymmetric increased soft tissue within the right breast parenchyma corresponding to patient's biopsy-proven invasive breast carcinoma.  There is associated overlying skin thickening.  Multiple enlarged right axillary lymph nodes are identified.  Index lymph node measures 1.7 cm, image 23/series 2. Several enhancing, sub centimeter retropectoral lymph  nodes are identified.  IMPRESSION:  1.  No acute findings. 2.  Right breast invasive carcinoma with associated right axillary adenopathy.  CT ABDOMEN AND PELVIS  Findings:  There are no focal liver abnormalities identified.  The gallbladder appears normal.  There is no biliary dilatation.  The pancreas is unremarkable.  Normal appearance of the spleen.  The adrenal glands are both normal.  The kidneys are unremarkable. Urinary bladder appears normal.  The uterus is unremarkable. Corpus luteal cyst is noted in the left ovary measuring 1.6 cm.  No free fluid or fluid collections identified within the upper abdomen or pelvis.  The abdominal aorta has a normal caliber. There is no adenopathy identified within the upper abdomen.  No pelvic or inguinal adenopathy noted.  The stomach is normal.  The small bowel loops are unremarkable. Normal appearance of the colon.  Review of the visualized osseous structures is negative for aggressive lytic or sclerotic bone lesion.  IMPRESSION:  1.  No acute findings within the abdomen or pelvis. 2.  No mass or adenopathy.   Original Report Authenticated By: Signa Kell, M.D.   Mr Breast Bilateral W Wo Contrast  01/09/2013   *RADIOLOGY REPORT*  Clinical Data: Biopsy-proven right breast inflammatory carcinoma with primary invasive mammary carcinoma and previous biopsy and known right axillary metastatic disease.  Preoperative staging MRI.  BUN and creatinine were obtained on site at [facility] at [address] Results:  BUN 10 mg/dL,  Creatinine 0.9 mg/dL.  BILATERAL BREAST MRI WITH AND WITHOUT CONTRAST  Technique: Multiplanar, multisequence MR images of both breasts were obtained prior to and following the intravenous administration of 13ml of Multihance.  Three dimensional images were evaluated at the independent DynaCad workstation.  Comparison:  Prior mammograms and ultrasound.  The patient underwent diagnostic mammography and ultrasound of the left breast today  as no recent prior exam was available after the recent diagnosis in the right breast.  Findings: There is diffuse right breast skin thickening and edema, trabecular edema, and right axillary lymphadenopathy with maximal nodal measurement 3.1 cm short-axis diameter.  There is also a 5 mm internal mammary artery chain  lymph node identified image 26 series 2. There is an incompletely visualized lymph node subjacent to the pectoralis minor muscle on the first image of the T2-weighted series.  There is a large irregular enhancing mass in the right breast 11 o'clock location with associated clip artifact measuring maximally 8.0 x 5.6 by 4.7 cm. This demonstrates washin/washout type enhancement kinetics.  This corresponds to the biopsy-proven invasive mammary carcinoma.  There are abnormal nodular areas of trabecular has been throughout the right breast involving all four quadrants. No abnormal underlying pectoralis enhancement or chest wall involvement is identified.  In the left breast 11 o'clock location, there is an 8 mm enhancing nodule corresponding to that seen at the examination earlier today. This area demonstrates plateau type enhancement kinetics.  No left- sided axillary or internal mammary artery chain lymphadenopathy is identified.  There is mild inhomogeneous nodular enhancement in the left breast two o'clock location at the site of the mass seen in this location earlier today, measuring 9 mm, measuring progressive enhancement kinetics.  IMPRESSION: Large irregular enhancing right breast mass 11 o'clock location corresponding to biopsy-proven invasive mammary carcinoma, with metastatic right axillary lymphadenopathy as well as skin and trabecular thickening/enhancement compatible with the clinical diagnosis of inflammatory and/or locally advanced breast cancer.  5 mm abnormal right internal mammary artery chain lymph node.  8 mm enhancing mass left breast 11 o'clock location and mildly irregular area of  mass-like enhancement in the left breast two o'clock location, corresponding to those seen at diagnostic examination earlier today.  Biopsy has been scheduled for today and will be dictated separately.  RECOMMENDATION: Treatment plan  THREE-DIMENSIONAL MR IMAGE RENDERING ON INDEPENDENT WORKSTATION:  Three-dimensional MR images were rendered by post-processing of the original MR data on an independent workstation.  The three- dimensional MR images were interpreted, and findings were reported in the accompanying complete MRI report for this study.  BI-RADS CATEGORY 6:  Known biopsy-proven malignancy - appropriate action should be taken.   Original Report Authenticated By: Christiana Pellant, M.D.   Nm Pet Image Initial (pi) Skull Base To Thigh  01/20/2013   *RADIOLOGY REPORT*  Clinical Data: Initial treatment strategy for breast cancer.  NUCLEAR MEDICINE PET SKULL BASE TO THIGH  Fasting Blood Glucose:  263  Technique:  19.1 mCi F-18 FDG was injected intravenously. CT data was obtained and used for attenuation correction and anatomic localization only.  (This was not acquired as a diagnostic CT examination.) Additional exam technical data entered on technologist worksheet.  Comparison:  None  Findings:  Neck: No hypermetabolic lymph nodes in the neck.  Chest:  No hypermetabolic mediastinal or hilar nodes.  No suspicious pulmonary nodules on the CT scan. Enlarged right axillary lymph nodes identified.  Index lymph node measures 1.7 cm and has an SUV max equal to 2.8, image 77.  There is asymmetric increased uptake throughout the right breast with associated overlying skin thickening.  This has an SUV max equal to 4.4, image 101.  Abdomen/Pelvis:  No abnormal hypermetabolic activity within the liver, pancreas, adrenal glands, or spleen.  No hypermetabolic lymph nodes in the abdomen or pelvis. There is a small focus of increased FDG uptake within the left ovary.  This has an SUV max equal to 13.1, image 217.  Skeleton:  No  focal hypermetabolic activity to suggest skeletal metastasis.  IMPRESSION:  1.  Asymmetric increased uptake associated with the right breast and right breast skin thickening corresponding to biopsy-proven invasive breast cancer. 2.  Enlarged and hypermetabolic  right axillary lymph nodes consistent with metastatic adenopathy. 3.  No evidence for distant metastatic disease. 4.  Focal area of increased uptake within the left axilla.  The patient is noted to have a corpus luteal cyst are not diagnostic CT images.  This activity is favored to represent physiologic activity.  Consider confirmatory imaging with pelvic sonogram.   Original Report Authenticated By: Signa Kell, M.D.   Ir Fluoro Guide Cv Line Left  01/15/2013   *RADIOLOGY REPORT*  Clinical Data/Indication: Breast cancer  TUNNEL POWER PORT PLACEMENT WITH SUBCUTANEOUS POCKET UTILIZING ULTRASOUND & FLOUROSCOPY  Sedation: Versed 5.0 mg, Fentanyl 250 mcg.  Total Moderate Sedation Time: 35 minutes.  As antibiotic prophylaxis, Ancef  was ordered pre-procedure and administered intravenously within one hour of incision.  Fluoroscopy Time: 36 seconds.  Procedure:  After written informed consent was obtained, patient was placed in the supine position on angiographic table. The left neck and chest was prepped and draped in a sterile fashion. Lidocaine was utilized for local anesthesia.  The left internal jugular vein was noted to be patent initially with ultrasound. Under sonographic guidance, a micropuncture needle was inserted into the left IJ vein (Ultrasound and fluoroscopic image documentation was performed). The needle was removed over an 018 wire which was exchanged for a Amplatz.  This was advanced into the IVC.  An 8-French dilator was advanced over the Amplatz.  A small incision was made in the left upper chest over the anterior left second rib.  Utilizing blunt dissection, a subcutaneous pocket was created in the caudal direction. The pocket was irrigated  with a copious amount of sterile normal saline.  The port catheter was tunneled from the chest incision, and out the neck incision.  The reservoir was inserted into the subcutaneous pocket and secured with two 3-0 Ethilon stitches.  A peel-away sheath was advanced over the Amplatz wire.  The port catheter was cut to measure length and inserted through the peel-away sheath.  The peel-away sheath was removed.  The chest incision was closed with 3-0 Vicryl interrupted stitches for the subcutaneous tissue and a running of 4- 0 Vicryl subcuticular stitch for the skin.  The neck incision was closed with a 4-0 Vicryl subcuticular stitch.  Derma-bond was applied to both surgical incisions.  The port reservoir was flushed and instilled with heparinized saline.  No complications.  Findings:  A left IJ vein Port-A-Cath is in place with its tip at the cavoatrial junction.  IMPRESSION: Successful 8 French left internal jugular vein power port placement with its tip at the SVC/RA junction.   Original Report Authenticated By: Jolaine Click, M.D.   Ir US Guide Vasc Access Left  01/15/2013   *RADIOLOGY REPORT*  Clinical Data/Indication: Breast cancer  TUNNEL POWER PORT PLACEMENT WITH SUBCUTANEOUS POCKET UTILIZING ULTRASOUND & FLOUROSCOPY  Sedation: Versed 5.0 mg, Fentanyl 250 mcg.  Total Moderate Sedation Time: 35 minutes.  As antibiotic prophylaxis, Ancef  was ordered pre-procedure and administered intravenously within one hour of incision.  Fluoroscopy Time: 36 seconds.  Procedure:  After written informed consent was obtained, patient was placed in the supine position on angiographic table. The left neck and chest was prepped and draped in a sterile fashion. Lidocaine was utilized for local anesthesia.  The left internal jugular vein was noted to be patent initially with ultrasound. Under sonographic guidance, a micropuncture needle was inserted into the left IJ vein (Ultrasound and fluoroscopic image documentation was performed).  The needle was removed over an 018 wire which  was exchanged for a Amplatz.  This was advanced into the IVC.  An 8-French dilator was advanced over the Amplatz.  A small incision was made in the left upper chest over the anterior left second rib.  Utilizing blunt dissection, a subcutaneous pocket was created in the caudal direction. The pocket was irrigated with a copious amount of sterile normal saline.  The port catheter was tunneled from the chest incision, and out the neck incision.  The reservoir was inserted into the subcutaneous pocket and secured with two 3-0 Ethilon stitches.  A peel-away sheath was advanced over the Amplatz wire.  The port catheter was cut to measure length and inserted through the peel-away sheath.  The peel-away sheath was removed.  The chest incision was closed with 3-0 Vicryl interrupted stitches for the subcutaneous tissue and a running of 4- 0 Vicryl subcuticular stitch for the skin.  The neck incision was closed with a 4-0 Vicryl subcuticular stitch.  Derma-bond was applied to both surgical incisions.  The port reservoir was flushed and instilled with heparinized saline.  No complications.  Findings:  A left IJ vein Port-A-Cath is in place with its tip at the cavoatrial junction.  IMPRESSION: Successful 8 French left internal jugular vein power port placement with its tip at the SVC/RA junction.   Original Report Authenticated By: Jolaine Click, M.D.   Mm Digital Diagnostic Unilat L  01/09/2013   *RADIOLOGY REPORT*  Clinical Data:  Evaluate clip placement following two separate ultrasound-guided left breast biopsies.  DIGITAL DIAGNOSTIC LEFT MAMMOGRAM  Comparison:  Previous exams  Findings:  Films are performed following two separate ultrasound guided biopsies of the left breast - at the 11 o'clock position and 2 o'clock position. The ribbon shaped biopsy clip corresponds to the 11 o'clock mass and the T shaped biopsy clip corresponds to the 2 o'clock mass. No immediate  complications are identified.  IMPRESSION: Satisfactory clip placement following ultrasound guided left breast biopsies.  Pathology will be followed.   Original Report Authenticated By: Harmon Pier, M.D.   Mm Digital Diagnostic Unilat L  01/09/2013   *RADIOLOGY REPORT*  Clinical Data:  47 year old female with newly diagnosed right breast cancer.  Left mammogram to compare to presurgical MRI.  DIGITAL DIAGNOSTIC LEFT MAMMOGRAM WITH CAD AND LEFT BREAST ULTRASOUND:  Comparison:  06/11/2012 mammogram and 01/09/2013 MRI  Findings:  ACR Breast Density Category 3: The breast tissue is heterogeneously dense.  A possible circumscribed mass in the upper outer left breast is identified. No suspicious calcifications or distortion identified.  Mammographic images were processed with CAD.  On physical exam, no palpable abnormalities identified within the upper left breast.  Ultrasound is performed, showing a 6 x 6 x 8 mm slightly irregular hypoechoic mass at the 11 o'clock position of the left breast 5 cm from the nipple An 11 x 7 x 9 mm slightly irregular hypoechoic mass at the 2 o'clock position of the left breast 4 cm from the nipple is also present. No enlarged or abnormal appearing left axillary lymph nodes are identified.  IMPRESSION: Indeterminate 6 x 6 x 8 mm hypoechoic mass at the 11 o'clock position of the left breast and 11 x 7 x 9 mm hypoechoic mass in the 2 o'clock position of the left breast.  The 11 o'clock position mass probably represents the enhancing nodule identified on today's MRI.  Tissue sampling of both masses is recommended.  BI-RADS CATEGORY 4:  Suspicious abnormality - biopsy should be considered.  RECOMMENDATION: Ultrasound guided  biopsies of both left breast masses.  The findings and recommendations were discussed with the patient and she desires to proceed with ultrasound guided left breast biopsies. These biopsies will be performed today but dictated in a separate report.  Results were also  provided in writing at the conclusion of the visit.  If applicable, a reminder letter will be sent to the patient regarding the next appointment.   Original Report Authenticated By: Harmon Pier, M.D.   [place] Lt Breast Bx W Loc Dev 1st Lesion Img Bx Spec US Guide  01/12/2013   *RADIOLOGY REPORT*  Clinical Data:  47 year old female with newly diagnosed right breast cancer.  Two left breast masses identified sonographically - 6 x 8 mm at the 11 o'clock position and 9 x 11 mm at the 2 o'clock position. For tissue sampling of the 6 x 8 mm mass at the 11 o'clock position.  ULTRASOUND GUIDED VACUUM ASSISTED CORE BIOPSY OF THE LEFT BREAST  Comparison: Previous exams.  I met with the patient and we discussed the procedure of ultrasound- guided biopsy, including benefits and alternatives.  We discussed the high likelihood of a successful procedure. We discussed the risks of the procedure including infection, bleeding, tissue injury, clip migration, and inadequate sampling.  Informed written consent was given.  Using sterile technique and 2% Lidocaine as local anesthetic, under direct ultrasound visualization, a12 gauge vacuum-assisteddevice was used to perform biopsy of the 6 x 8 mm hypoechoic mass at the 11 o'clock position of the left breast 5 cm from the nipple using a medial approach. At the conclusion of the procedure, a ribbon shaped tissue marker clip was deployed into the biopsy cavity. Follow-up 2-view mammogram was performed and dictated separately.  The usual time-out protocol was performed immediately prior to the procedure.  IMPRESSION: Ultrasound-guided biopsy of 6 x 8 mm hypoechoic mass at the 11 o'clock position of the left breast.  No apparent complications.  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  Histology correlates with imaging findings.  The patient was contacted by phone on 01/12/2013 and these results given to her which she understood. Her  questions were answered. The patient had no complaints with her biopsy site.  Recommend treatment plan.   Original Report Authenticated By: Harmon Pier, M.D.   [place] Lt Breast Bx W Loc Dev Ea Add Lesion Img Bx Spec US Guide  01/12/2013   *RADIOLOGY REPORT*  Clinical Data:  48 year old female with newly diagnosed right breast cancer.  Two left breast masses identified sonographically - 6 x 8 mm at the 11 o'clock position and 9 x 11 mm at the 2 o'clock position. For tissue sampling of the 9 x 11 mm mass at the 2 o'clock position.  ULTRASOUND GUIDED VACUUM ASSISTED CORE BIOPSY OF THE LEFT BREAST  Comparison: Previous exams.  I met with the patient and we discussed the procedure of ultrasound- guided biopsy, including benefits and alternatives.  We discussed the high likelihood of a successful procedure. We discussed the risks of the procedure including infection, bleeding, tissue injury, clip migration, and inadequate sampling.  Informed written consent was given.  Using sterile technique and 2% Lidocaine as local anesthetic, under direct ultrasound visualization, a 12 gauge vacuum-assisteddevice was used to perform biopsy of the 9 x 11 mm hypoechoic mass at the 2 o'clock position of the left breast 4 cm from the nipple using a medial approach. At the conclusion of the procedure, a  tissue marker  clip was deployed into the biopsy cavity.  Follow-up 2-view mammogram was performed and dictated separately.  The usual time-out protocol was performed immediately prior to the procedure.  IMPRESSION: Ultrasound-guided biopsy of 9 x 11 mm mass in the 2 o'clock position of the left breast.  No apparent complications.  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 Histology correlates with imaging findings.  The patient was contacted by phone on 01/12/2013 and these results given to her which she understood. Her questions were answered. The patient had no complaints with her  biopsy site.  Recommend treatment plan.   Original Report Authenticated By: Harmon Pier, M.D.    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 (T3 N1) Patient was administered adjuvant chemotherapy consisting of Taxotere carboplatinum Herceptin and perjeta x6 cycles from 01/16/2013 through 05/01/2013. Overall she tolerated it well.  #2 patient is now status post bilateral mastectomies on 06/09/2013 with the final pathology revealing no evidence of residual carcinoma in the right breast 14 lymph nodes were negative for metastatic disease. With the final pathologic staging at T0 N0 with complete pathologic response to chemotherapy.  #3 patient to proceed with adjuvant Herceptin every 3 weeks to complete one year. She is followed by cardiology as well for ongoing echocardiograms and evaluation for Herceptin induced cardio toxicity.  Her last appointment was on 05/27/13 and she was recommended to proceed with herceptin with 3 month follow up.    #4 patient will not receive antiestrogen therapy since her tumor is estrogen receptor and progesterone receptor negative  PLAN:   #1 Jasmine Schwartz will proceed with Herceptin today.  I reviewed her labs with her in detail.  She is planning on starting radiation therapy at [facility] the first week in December.   #2 She will take Gabapentin 200mg  BID for her neuropathy.  I counseled her on the neuropathy and prescribed this for her today.    #3 She will return in 3 weeks for her next appointment.    All questions were answered. The patient knows to call the clinic with any problems, questions or concerns. We can certainly see the patient much sooner if necessary.  I spent 25 minutes counseling the patient face to face. The total time spent in the appointment was 30 minutes.   Illa Level, NP Medical  Oncology [facility] [phone]

## 2013-07-07 NOTE — Telephone Encounter (Signed)
appts made and printed. Pt is aware that tx will be added. i emailed MW to add the tx...td 

## 2013-07-07 NOTE — Patient Instructions (Signed)
[facility] Discharge Instructions for Patients Receiving Chemotherapy  Today you received the following chemotherapy agents Herceptin.  To help prevent nausea and vomiting after your treatment, we encourage you to take your nausea medication as prescribed.   If you develop nausea and vomiting that is not controlled by your nausea medication, call the clinic.   BELOW ARE SYMPTOMS THAT SHOULD BE REPORTED IMMEDIATELY:  *FEVER GREATER THAN 100.5 F  *CHILLS WITH OR WITHOUT FEVER  NAUSEA AND VOMITING THAT IS NOT CONTROLLED WITH YOUR NAUSEA MEDICATION  *UNUSUAL SHORTNESS OF BREATH  *UNUSUAL BRUISING OR BLEEDING  TENDERNESS IN MOUTH AND THROAT WITH OR WITHOUT PRESENCE OF ULCERS  *URINARY PROBLEMS  *BOWEL PROBLEMS  UNUSUAL RASH Items with * indicate a potential emergency and should be followed up as soon as possible.  Feel free to call the clinic you have any questions or concerns. The clinic phone number is [phone].    

## 2013-07-07 NOTE — Patient Instructions (Signed)
[facility] Discharge Instructions for Patients Receiving Chemotherapy  Today you received the following chemotherapy agents Herceptin.  To help prevent nausea and vomiting after your treatment, we encourage you to take your nausea medication as prescribed.   If you develop nausea and vomiting that is not controlled by your nausea medication, call the clinic.   BELOW ARE SYMPTOMS THAT SHOULD BE REPORTED IMMEDIATELY:  *FEVER GREATER THAN 100.5 F  *CHILLS WITH OR WITHOUT FEVER  NAUSEA AND VOMITING THAT IS NOT CONTROLLED WITH YOUR NAUSEA MEDICATION  *UNUSUAL SHORTNESS OF BREATH  *UNUSUAL BRUISING OR BLEEDING  TENDERNESS IN MOUTH AND THROAT WITH OR WITHOUT PRESENCE OF ULCERS  *URINARY PROBLEMS  *BOWEL PROBLEMS  UNUSUAL RASH Items with * indicate a potential emergency and should be followed up as soon as possible.  Feel free to call the clinic you have any questions or concerns. The clinic phone number is [phone].    

## 2013-07-10 ENCOUNTER — Telehealth: Payer: Self-pay | Admitting: *Deleted

## 2013-07-10 NOTE — Telephone Encounter (Signed)
Per staff message and POF I have scheduled appts.  [name]  

## 2013-07-23 ENCOUNTER — Encounter (INDEPENDENT_AMBULATORY_CARE_PROVIDER_SITE_OTHER): Payer: Self-pay

## 2013-07-23 ENCOUNTER — Encounter (INDEPENDENT_AMBULATORY_CARE_PROVIDER_SITE_OTHER): Payer: Self-pay | Admitting: General Surgery

## 2013-07-23 ENCOUNTER — Ambulatory Visit (INDEPENDENT_AMBULATORY_CARE_PROVIDER_SITE_OTHER): Payer: BC Managed Care – PPO | Admitting: General Surgery

## 2013-07-23 VITALS — BP 118/68 | HR 88 | Temp 98.6°F | Resp 14 | Ht 63.0 in | Wt 144.2 lb

## 2013-07-23 DIAGNOSIS — C50919 Malignant neoplasm of unspecified site of unspecified female breast: Secondary | ICD-10-CM

## 2013-07-23 DIAGNOSIS — C50911 Malignant neoplasm of unspecified site of right female breast: Secondary | ICD-10-CM

## 2013-07-23 NOTE — Patient Instructions (Signed)
You are recovering from your bilateral mastectomy without any obvious surgical complications.  Continue physical therapy until you get full range of motion on your right shoulder.  You may return to work on 07/29/2013.  We will plan to remove your Port-A-Cath some time this summer after you complete your Herceptin treatments in June  Return to see Dr. Derrell Lolling in 6 months, sooner if there are problems.

## 2013-07-23 NOTE — Progress Notes (Signed)
Patient ID: Jasmine Schwartz, female   DOB: April 19, 1966, 47 y.o.   MRN: [id number]   History:  This patient returns for a postop visit.  She initially presented with a locally advanced cancer in the right breast, clinical stage T3, N1, receptor negative, HER-2/neu-positive. Initial breast and axillary biopsies were positive.She had 2 small densities in the left breast on MRI which were biopsied and showed fibroadenomatous. PET CT showed no systemic metastasis. Port-A-Cath was placed and she was given neoadjuvant chemotherapy. She had excellent response.  On 06/09/2013 she underwent right modified radical mastectomy and left total mastectomy. She had a complete pathologic response in the right breast. 0/14 lymph nodes positive. YpT0, ypN0. Left breast was benign.  She knows that she will need to complete one year of Herceptin based chemotherapy, which should end in June, 2015.Marland Kitchen  She has full range of motion of the left shoulder. She continues with physical therapy and still has a little bit of limitation in the right shoulder. 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   Exam:  Patient looks well. No distress.  Hair starting to come back in.  Bilateral mastectomy skin flaps looked great. Healthy skin. No sign of infection. No sign of residual fluid.  Range of motion right shoulder is about 160 of abduction. Range of motion left shoulder is normal.  Assessment:  Locally advanced right breast cancer, receptor-negative, HER-2 positive, pretreatment clinical stage T3, N1  Abnormal MRI left breast, status post biopsy at 11:00 and 2:00 position revealing benign fibroadenomas  Doing well following recent bilateral total mastectomy and right axillary lymph node dissection.  Complete pathologic response in right breast. No malignancy seen in left breast either.  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 May return to work December 17.   Continue physical therapy  See me in 6 months, sooner if there is any problem. . .  Complete course of her Herceptin based chemotherapy in June. She states that she will start radiation therapy to the right chest wall this month.  Plastic surgery referral. She will see Dr. Etter Sjogren on January 12 for a initial consultation.  Port-A-Cath removal after all adjuvant therapy is completed - she requests sedation for this.    Angelia Mould. Derrell Lolling, M.D., [facility]  General and Minimally invasive Surgery  Breast and Colorectal Surgery  Office: [phone]  Pager: [phone]

## 2013-07-28 ENCOUNTER — Other Ambulatory Visit (HOSPITAL_BASED_OUTPATIENT_CLINIC_OR_DEPARTMENT_OTHER): Payer: BC Managed Care – PPO

## 2013-07-28 ENCOUNTER — Telehealth: Payer: Self-pay | Admitting: Oncology

## 2013-07-28 ENCOUNTER — Encounter: Payer: Self-pay | Admitting: Oncology

## 2013-07-28 ENCOUNTER — Ambulatory Visit (HOSPITAL_BASED_OUTPATIENT_CLINIC_OR_DEPARTMENT_OTHER): Payer: BC Managed Care – PPO | Admitting: Oncology

## 2013-07-28 ENCOUNTER — Ambulatory Visit (HOSPITAL_BASED_OUTPATIENT_CLINIC_OR_DEPARTMENT_OTHER): Payer: BC Managed Care – PPO

## 2013-07-28 VITALS — BP 116/76 | HR 97 | Temp 98.3°F | Resp 20 | Ht 63.0 in | Wt 147.0 lb

## 2013-07-28 DIAGNOSIS — C50419 Malignant neoplasm of upper-outer quadrant of unspecified female breast: Secondary | ICD-10-CM

## 2013-07-28 DIAGNOSIS — C50919 Malignant neoplasm of unspecified site of unspecified female breast: Secondary | ICD-10-CM

## 2013-07-28 DIAGNOSIS — C50911 Malignant neoplasm of unspecified site of right female breast: Secondary | ICD-10-CM

## 2013-07-28 DIAGNOSIS — C773 Secondary and unspecified malignant neoplasm of axilla and upper limb lymph nodes: Secondary | ICD-10-CM

## 2013-07-28 DIAGNOSIS — G589 Mononeuropathy, unspecified: Secondary | ICD-10-CM

## 2013-07-28 DIAGNOSIS — Z171 Estrogen receptor negative status [ER-]: Secondary | ICD-10-CM

## 2013-07-28 DIAGNOSIS — Z5112 Encounter for antineoplastic immunotherapy: Secondary | ICD-10-CM

## 2013-07-28 LAB — CBC WITH DIFFERENTIAL/PLATELET
BASO%: 0.3 % (ref 0.0–2.0)
Basophils Absolute: 0 10*3/uL (ref 0.0–0.1)
Eosinophils Absolute: 0.2 10*3/uL (ref 0.0–0.5)
HCT: 38 % (ref 34.8–46.6)
HGB: 12.6 g/dL (ref 11.6–15.9)
MCH: 28.6 pg (ref 25.1–34.0)
MCHC: 33.2 g/dL (ref 31.5–36.0)
MONO#: 0.4 10*3/uL (ref 0.1–0.9)
NEUT#: 4.4 10*3/uL (ref 1.5–6.5)
NEUT%: 65 % (ref 38.4–76.8)
RBC: 4.41 10*6/uL (ref 3.70–5.45)
WBC: 6.8 10*3/uL (ref 3.9–10.3)
lymph#: 1.8 10*3/uL (ref 0.9–3.3)

## 2013-07-28 LAB — COMPREHENSIVE METABOLIC PANEL (CC13)
ALT: 17 U/L (ref 0–55)
AST: 21 U/L (ref 5–34)
Albumin: 3.6 g/dL (ref 3.5–5.0)
Anion Gap: 7 mEq/L (ref 3–11)
BUN: 17.7 mg/dL (ref 7.0–26.0)
Calcium: 9.6 mg/dL (ref 8.4–10.4)
Chloride: 103 mEq/L (ref 98–109)
Glucose: 163 mg/dl — ABNORMAL HIGH (ref 70–140)
Potassium: 4.4 mEq/L (ref 3.5–5.1)
Total Bilirubin: 0.4 mg/dL (ref 0.20–1.20)

## 2013-07-28 MED ORDER — SODIUM CHLORIDE 0.9 % IJ SOLN
10.0000 mL | INTRAMUSCULAR | Status: DC | PRN
  Administered 2013-07-28: 10 mL
  Filled 2013-07-28: qty 10

## 2013-07-28 MED ORDER — TRASTUZUMAB CHEMO INJECTION 440 MG
6.0000 mg/kg | Freq: Once | INTRAVENOUS | Status: AC
  Administered 2013-07-28: 399 mg via INTRAVENOUS
  Filled 2013-07-28: qty 19

## 2013-07-28 MED ORDER — SODIUM CHLORIDE 0.9 % IV SOLN
Freq: Once | INTRAVENOUS | Status: AC
  Administered 2013-07-28: 11:00:00 via INTRAVENOUS

## 2013-07-28 MED ORDER — HEPARIN SOD (PORK) LOCK FLUSH 100 UNIT/ML IV SOLN
500.0000 [IU] | Freq: Once | INTRAVENOUS | Status: AC | PRN
  Administered 2013-07-28: 500 [IU]
  Filled 2013-07-28: qty 5

## 2013-07-28 MED ORDER — ACETAMINOPHEN 325 MG PO TABS
650.0000 mg | ORAL_TABLET | Freq: Once | ORAL | Status: AC
  Administered 2013-07-28: 650 mg via ORAL

## 2013-07-28 MED ORDER — ACETAMINOPHEN 325 MG PO TABS
ORAL_TABLET | ORAL | Status: AC
  Filled 2013-07-28: qty 2

## 2013-07-28 MED ORDER — DIPHENHYDRAMINE HCL 25 MG PO CAPS
50.0000 mg | ORAL_CAPSULE | Freq: Once | ORAL | Status: AC
  Administered 2013-07-28: 50 mg via ORAL

## 2013-07-28 MED ORDER — DIPHENHYDRAMINE HCL 25 MG PO CAPS
ORAL_CAPSULE | ORAL | Status: AC
  Filled 2013-07-28: qty 2

## 2013-07-28 NOTE — Patient Instructions (Signed)
[facility] Discharge Instructions for Patients Receiving Chemotherapy  Today you received the following chemotherapy agent: Herceptin   To help prevent nausea and vomiting after your treatment, we encourage you to take your nausea medication as prescribed.    If you develop nausea and vomiting that is not controlled by your nausea medication, call the clinic.   BELOW ARE SYMPTOMS THAT SHOULD BE REPORTED IMMEDIATELY:  *FEVER GREATER THAN 100.5 F  *CHILLS WITH OR WITHOUT FEVER  NAUSEA AND VOMITING THAT IS NOT CONTROLLED WITH YOUR NAUSEA MEDICATION  *UNUSUAL SHORTNESS OF BREATH  *UNUSUAL BRUISING OR BLEEDING  TENDERNESS IN MOUTH AND THROAT WITH OR WITHOUT PRESENCE OF ULCERS  *URINARY PROBLEMS  *BOWEL PROBLEMS  UNUSUAL RASH Items with * indicate a potential emergency and should be followed up as soon as possible.  Feel free to call the clinic you have any questions or concerns. The clinic phone number is [phone].    

## 2013-07-28 NOTE — Progress Notes (Signed)
OFFICE PROGRESS NOTE  CC  [name], MD [address] [place] [address] Dr. Lurline Hare  Dr. 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 At that time she had mammogram performed and it was negative. However her breast continue to increase in size. Initially patient felt that it could have been cysts or something on and therefore she did not go to a physician. However subsequently patient's breast became quite painful and she also began to feel in knot under her right arm.because of this she went to her physician who did a physical exam and a mammogram was performed on 01/01/2013 the mammogram showed diffuse skin thickening generalized increased density within the right breast and enlarged right axillary lymph nodes. She went on to have an ultrasound of the right breast performed that showed a large spiculated hypoechoic mass in the right breast 11:00 2 cm from the nipple measuring at least 4.5 cm in size. There were abnormal lymph nodes with thickened cortex and the right axilla the largest one measuring 3.1 cm in dimension.  #2Patient was recommended ultrasound guided core biopsy of the right breast mass and the right axilla lymph node. The biopsy was performed on 01/01/2013. The right needle core biopsy of the primary mass showed invasive mammary carcinoma grade 2-3 was invasive ductal. The lymph node of the right axilla was positive for metastatic disease. The tumor was ER PR negative HER-2/neu positive with a Ki-67 of 76%. On 01/09/2013 patient had MRI of the breasts performed that showed diffuse right breast skin thickening and edema with right axillary lymph node. There was a 5 mm internal mammary lymph node that  was incompletely imaged. The right breast mass measured 8 x 5 x 4.7 cm. There were abnormal areas involving all 4 quadrants. There was however no involvement of the pectoralis minor for lower chest wall. The left breast showed 2 areas of nodular enhancement these were biopsied and were fibroadenomas  #3 it was recommended that patient begin neoadjuvant chemotherapy consisting of Taxotere carboplatinum Herceptin and perjeta. This would be given every 3 weeks with day 2 Neulasta. A total of 6 cycles of therapy were planned. Once she completes the treatment then she would proceed with MRI of the breasts again for evaluation a response to therapy and eventually a mastectomy.  #4 patient is status post Neoadjuvant with curative intentTaxotere carboplatinum Herceptin and perjeta starting 01/16/2013 - 05/01/2013.  #5 patient is status post bilateral mastectomies with the left breast revealing no evidence of cancer. Right breast reveals no residual carcinoma 14 right axillary lymph nodes were negative for metastatic disease.  #6 patient began adjuvant Herceptin starting 06/16/2013. Patient will complete out 1 year of adjuvant Herceptin.   CURRENT THERAPY:  Adjuvant herceptin beginning 06/16/13 INTENT: Curative  INTERVAL HISTORY: Jasmine Schwartz 47 y.o. female returns for followup visit today. She is doing well today.  She continues to have difficulty with neuropathy.  She is taking Gabapentin 100mg  BID.  Otherwise, she denies fevers, chills, nausea, vomiting, chest pain, palpitations, DOE, PND, orthopnea, or any further concerns.   MEDICAL HISTORY: Past Medical History  Diagnosis Date  . Anxiety   . Depression   . Dyslipidemia   . Anxiety and depression 07/06/2011  .  Hx of insertion of insulin pump ~ 2001 to present (06/09/2013)  . PONV (postoperative nausea and vomiting)   . Venous insufficiency   . Lower extremity edema   . Diabetes mellitus     IDDM  . Type I diabetes mellitus     uses insulin  pump  . Breast cancer 01/01/13    /metastatic 1/1 lymph nodes    ALLERGIES:  has No Known Allergies.  MEDICATIONS:  Current Outpatient Prescriptions  Medication Sig Dispense Refill  . cyanocobalamin 500 MCG tablet Take 500 mcg by mouth daily.      Marland Kitchen glucose blood (NOVA MAX TEST) test strip Use as directed up to 8 times a day      . Insulin Infusion Pump (MINIMED INSULIN PUMP) DEVI Inject 1 Units/hr into the vein continuous. Pt give extra per cards at meal times. Basic meals are about 5 units      . Insulin Infusion Pump Supplies (MINIMED INFUSION SET-MMT 397) MISC 1 Device by Does not apply route every 3 (three) days.      . Insulin Infusion Pump Supplies (PARADIGM PUMP RESERVOIR 1.76ML) MISC 1 Device by Does not apply route every 3 (three) days.      . insulin lispro (HUMALOG) 100 UNIT/ML injection Inject as directed into Insulin pump, total of 40 units/day  20 mL  2  . LORazepam (ATIVAN) 0.5 MG tablet Take 0.5 mg by mouth every 8 (eight) hours as needed for anxiety.      Marland Kitchen UNABLE TO FIND Rx: K7425- Mastectomy Form, Bilateral (Quantity: 2) L8000- Mastectomy Bra (Quantity: 2) Dx: 174.9; Bilateral Mastectomy  1 each  0  . UNABLE TO FIND Rx: [id number]- Post Mastectomy Garment (Quantity: 2) Dx: 174.9; Bilateral Mastectomy  1 each  0   No current facility-administered medications for this visit.    SURGICAL HISTORY:  Past Surgical History  Procedure Laterality Date  . Lumbar disc surgery  2009    Dr Jillyn Hidden  . Cesarean section  1989  . Tubal ligation  1991  . Breast biopsy Right 01/01/13    Invasive mammary ca,metastatic in 1/1 lymph node  . Breast biopsy Left 01/09/13    Fibroadenoma,microcalcifications  . Portacath placement Left 01/2013  . Mastectomy modified radical Right 06/09/2013  . Mastectomy complete / simple Left 06/09/2013  . Mastectomy modified radical Right 06/09/2013    Procedure: RIGHT MASTECTOMY MODIFIED RADICAL;  Surgeon: Ernestene Mention, MD;  Location: [facility] OR;  Service:  General;  Laterality: Right;  . Simple mastectomy with axillary sentinel node biopsy Left 06/09/2013    Procedure: LEFT SIMPLE MASTECTOMY;  Surgeon: Ernestene Mention, MD;  Location: [facility] OR;  Service: General;  Laterality: Left;    REVIEW OF SYSTEMS:  A 10 point review of systems was conducted and is otherwise negative except for what is noted above.    PHYSICAL EXAMINATION: Blood pressure 116/76, pulse 97, temperature 98.3 F (36.8 C), temperature source Oral, resp. rate 20, height 5\' 3"  (1.6 m), weight 147 lb (66.679 kg). Body mass index is 26.05 kg/(m^2). General: Patient is a well appearing female in no acute distress HEENT: PERRLA, sclerae anicteric no conjunctival pallor, MMM Neck: supple, no palpable adenopathy Lungs: clear to auscultation bilaterally, no wheezes, rhonchi, or rales Cardiovascular: regular rate rhythm, S1, S2, no murmurs, rubs or gallops Abdomen: Soft, non-tender, non-distended, normoactive bowel sounds, no HSM Extremities: warm and well perfused, no clubbing, cyanosis, or edema Skin: No rashes or lesions Neuro: Non-focal Breasts: Deferred ECOG PERFORMANCE STATUS:  0 - Asymptomatic  LABORATORY DATA: Lab Results  Component Value Date   WBC 6.8 07/28/2013   HGB 12.6 07/28/2013   HCT 38.0 07/28/2013   MCV 86.2 07/28/2013   PLT 285 07/28/2013      Chemistry      Component Value Date/Time   NA 140 07/07/2013 0951   NA 136 06/10/2013 0600   K 4.2 07/07/2013 0951   K 3.5 06/10/2013 0600   CL 104 06/10/2013 0600   CL 99 01/23/2013 1251   CO2 26 07/07/2013 0951   CO2 26 06/10/2013 0600   BUN 14.1 07/07/2013 0951   BUN 9 06/10/2013 0600   CREATININE 0.7 07/07/2013 0951   CREATININE 0.64 06/10/2013 0600      Component Value Date/Time   CALCIUM 9.0 07/07/2013 0951   CALCIUM 8.5 06/10/2013 0600   ALKPHOS 53 07/07/2013 0951   ALKPHOS 47 05/29/2013 0858   AST 17 07/07/2013 0951   AST 24 05/29/2013 0858   ALT 11 07/07/2013 0951   ALT 16 05/29/2013 0858    BILITOT 0.38 07/07/2013 0951   BILITOT 0.3 05/29/2013 0858     Pathology 06/09/2013:  Diagnosis 1. Breast, simple mastectomy, Left - FIBROCYSTIC CHANGE WITH ADENOSIS AND CALCIFICATIONS. - NO MALIGNANCY IDENTIFIED. 2. Breast, modified radical mastectomy , Right - BENIGN BREAST TISSUE WITH EXTENSIVE FIBROSIS CONSISTENT WITH TREATMENT EFFECT. - NO RESIDUAL CARCINOMA IDENTIFIED. - FOURTEEN LYMPH NODES NEGATIVE FOR CARCINOMA WITH FOCAL HYLANIZATION (0/14). - SEE ONCOLOGY TABLE AND COMMENT. Microscopic Comment 2. BREAST, INVASIVE TUMOR, WITH LYMPH NODE SAMPLING Specimen, including laterality and lymph node sampling (sentinel, non-sentinel): Right breast and axillary contents. Procedure: Right modified radical mastectomy. Histologic type, tumor grade, tumor size, margins, lympovascular invasion, tumor focality, and extent of tumor can not be assessed, as there is no residual carcinoma. Ductal carcinoma in situ: Absent. Lobular neoplasia: Absent. Treatment effect: Present, extensive. If present, treatment effect in breast tissue, lymph nodes or both: Both. Lymph nodes: Examined: 14 Total Lymph nodes with metastasis: 0 Isolated tumor cells (< 0.2 mm): 0 Micrometastasis: (> 0.2 mm and < 2.0 mm): 0 Macrometastasis: (> 2.0 mm): 0 Extracapsular extension: 0 Breast prognostic profile: [id number] Estrogen receptor: Negative. Progesterone receptor: Negative. Her 2 neu: Amplified. Ki-67: 76% Non-neoplastic breast: Fibrocystic change. 1 of 3 FINAL for Jasmine Schwartz, Jasmine Schwartz [phone]) Microscopic Comment(continued) TNM: ypT0, ypN0 Comments: There is no residual tumor in the current breast specimen, status post neoadjuvant chemotherapy, and therefore it is best staged as ypT0. Many of the nodes exhibit fibrosis consistent with treatment effect. Immunohistochemistry for pancytokeratin was performed on the two largest lymph nodes and was negative for carcinoma, resulting in a stage of ypN0. Valinda Hoar MD Pathologist, Electronic Signature (Case signed 06/11/2013) Specimen Gross and Clinical  RADIOGRAPHIC STUDIES:  Ct Chest W Contrast  01/20/2013   *RADIOLOGY REPORT*  Clinical Data:  High risk breast cancer.  Evaluate for metastatic disease.  CT CHEST, ABDOMEN AND PELVIS WITH CONTRAST  Technique:  Multidetector CT imaging of the chest, abdomen and pelvis was performed following the standard protocol during bolus administration of intravenous contrast.  Contrast: OMNIPAQUE IOHEXOL 300 MG/ML  SOLN  Comparison:  None  CT CHEST  Findings:  Lungs/pleura: There is no pleural effusion.  No suspicious pulmonary nodule or mass noted.  Heart/Mediastinum: Heart size is normal.  No pericardial effusion. No mediastinal or hilar adenopathy.  Bones/Musculoskeletal:  Asymmetric increased soft tissue within the right breast parenchyma corresponding to patient's biopsy-proven invasive breast carcinoma.  There is associated  overlying skin thickening.  Multiple enlarged right axillary lymph nodes are identified.  Index lymph node measures 1.7 cm, image 23/series 2. Several enhancing, sub centimeter retropectoral lymph nodes are identified.  IMPRESSION:  1.  No acute findings. 2.  Right breast invasive carcinoma with associated right axillary adenopathy.  CT ABDOMEN AND PELVIS  Findings:  There are no focal liver abnormalities identified.  The gallbladder appears normal.  There is no biliary dilatation.  The pancreas is unremarkable.  Normal appearance of the spleen.  The adrenal glands are both normal.  The kidneys are unremarkable. Urinary bladder appears normal.  The uterus is unremarkable. Corpus luteal cyst is noted in the left ovary measuring 1.6 cm.  No free fluid or fluid collections identified within the upper abdomen or pelvis.  The abdominal aorta has a normal caliber. There is no adenopathy identified within the upper abdomen.  No pelvic or inguinal adenopathy noted.  The stomach is normal.  The small  bowel loops are unremarkable. Normal appearance of the colon.  Review of the visualized osseous structures is negative for aggressive lytic or sclerotic bone lesion.  IMPRESSION:  1.  No acute findings within the abdomen or pelvis. 2.  No mass or adenopathy.   Original Report Authenticated By: Signa Kell, M.D.   US Breast Left  01/09/2013   *RADIOLOGY REPORT*  Clinical Data:  47 year old female with newly diagnosed right breast cancer.  Left mammogram to compare to presurgical MRI.  DIGITAL DIAGNOSTIC LEFT MAMMOGRAM WITH CAD AND LEFT BREAST ULTRASOUND:  Comparison:  06/11/2012 mammogram and 01/09/2013 MRI  Findings:  ACR Breast Density Category 3: The breast tissue is heterogeneously dense.  A possible circumscribed mass in the upper outer left breast is identified. No suspicious calcifications or distortion identified.  Mammographic images were processed with CAD.  On physical exam, no palpable abnormalities identified within the upper left breast.  Ultrasound is performed, showing a 6 x 6 x 8 mm slightly irregular hypoechoic mass at the 11 o'clock position of the left breast 5 cm from the nipple An 11 x 7 x 9 mm slightly irregular hypoechoic mass at the 2 o'clock position of the left breast 4 cm from the nipple is also present. No enlarged or abnormal appearing left axillary lymph nodes are identified.  IMPRESSION: Indeterminate 6 x 6 x 8 mm hypoechoic mass at the 11 o'clock position of the left breast and 11 x 7 x 9 mm hypoechoic mass in the 2 o'clock position of the left breast.  The 11 o'clock position mass probably represents the enhancing nodule identified on today's MRI.  Tissue sampling of both masses is recommended.  BI-RADS CATEGORY 4:  Suspicious abnormality - biopsy should be considered.  RECOMMENDATION: Ultrasound guided biopsies of both left breast masses.  The findings and recommendations were discussed with the patient and she desires to proceed with ultrasound guided left breast biopsies.  These biopsies will be performed today but dictated in a separate report.  Results were also provided in writing at the conclusion of the visit.  If applicable, a reminder letter will be sent to the patient regarding the next appointment.   Original Report Authenticated By: Harmon Pier, M.D.   Ct Abdomen Pelvis W Contrast  01/22/2013   *RADIOLOGY REPORT*  Clinical Data:  High risk breast cancer.  Evaluate for metastatic disease.  CT CHEST, ABDOMEN AND PELVIS WITH CONTRAST  Technique:  Multidetector CT imaging of the chest, abdomen and pelvis was performed following the standard protocol during  bolus administration of intravenous contrast.  Contrast: OMNIPAQUE IOHEXOL 300 MG/ML  SOLN  Comparison:  None  CT CHEST  Findings:  Lungs/pleura: There is no pleural effusion.  No suspicious pulmonary nodule or mass noted.  Heart/Mediastinum: Heart size is normal.  No pericardial effusion. No mediastinal or hilar adenopathy.  Bones/Musculoskeletal:  Asymmetric increased soft tissue within the right breast parenchyma corresponding to patient's biopsy-proven invasive breast carcinoma.  There is associated overlying skin thickening.  Multiple enlarged right axillary lymph nodes are identified.  Index lymph node measures 1.7 cm, image 23/series 2. Several enhancing, sub centimeter retropectoral lymph nodes are identified.  IMPRESSION:  1.  No acute findings. 2.  Right breast invasive carcinoma with associated right axillary adenopathy.  CT ABDOMEN AND PELVIS  Findings:  There are no focal liver abnormalities identified.  The gallbladder appears normal.  There is no biliary dilatation.  The pancreas is unremarkable.  Normal appearance of the spleen.  The adrenal glands are both normal.  The kidneys are unremarkable. Urinary bladder appears normal.  The uterus is unremarkable. Corpus luteal cyst is noted in the left ovary measuring 1.6 cm.  No free fluid or fluid collections identified within the upper abdomen or pelvis.   The abdominal aorta has a normal caliber. There is no adenopathy identified within the upper abdomen.  No pelvic or inguinal adenopathy noted.  The stomach is normal.  The small bowel loops are unremarkable. Normal appearance of the colon.  Review of the visualized osseous structures is negative for aggressive lytic or sclerotic bone lesion.  IMPRESSION:  1.  No acute findings within the abdomen or pelvis. 2.  No mass or adenopathy.   Original Report Authenticated By: Signa Kell, M.D.   Mr Breast Bilateral W Wo Contrast  01/09/2013   *RADIOLOGY REPORT*  Clinical Data: Biopsy-proven right breast inflammatory carcinoma with primary invasive mammary carcinoma and previous biopsy and known right axillary metastatic disease.  Preoperative staging MRI.  BUN and creatinine were obtained on site at [facility] at [address] Results:  BUN 10 mg/dL,  Creatinine 0.9 mg/dL.  BILATERAL BREAST MRI WITH AND WITHOUT CONTRAST  Technique: Multiplanar, multisequence MR images of both breasts were obtained prior to and following the intravenous administration of 13ml of Multihance.  Three dimensional images were evaluated at the independent DynaCad workstation.  Comparison:  Prior mammograms and ultrasound.  The patient underwent diagnostic mammography and ultrasound of the left breast today as no recent prior exam was available after the recent diagnosis in the right breast.  Findings: There is diffuse right breast skin thickening and edema, trabecular edema, and right axillary lymphadenopathy with maximal nodal measurement 3.1 cm short-axis diameter.  There is also a 5 mm internal mammary artery chain lymph node identified image 26 series 2. There is an incompletely visualized lymph node subjacent to the pectoralis minor muscle on the first image of the T2-weighted series.  There is a large irregular enhancing mass in the right breast 11 o'clock location with associated clip artifact measuring maximally 8.0 x  5.6 by 4.7 cm. This demonstrates washin/washout type enhancement kinetics.  This corresponds to the biopsy-proven invasive mammary carcinoma.  There are abnormal nodular areas of trabecular has been throughout the right breast involving all four quadrants. No abnormal underlying pectoralis enhancement or chest wall involvement is identified.  In the left breast 11 o'clock location, there is an 8 mm enhancing nodule corresponding to that seen at the examination earlier today. This area demonstrates  plateau type enhancement kinetics.  No left- sided axillary or internal mammary artery chain lymphadenopathy is identified.  There is mild inhomogeneous nodular enhancement in the left breast two o'clock location at the site of the mass seen in this location earlier today, measuring 9 mm, measuring progressive enhancement kinetics.  IMPRESSION: Large irregular enhancing right breast mass 11 o'clock location corresponding to biopsy-proven invasive mammary carcinoma, with metastatic right axillary lymphadenopathy as well as skin and trabecular thickening/enhancement compatible with the clinical diagnosis of inflammatory and/or locally advanced breast cancer.  5 mm abnormal right internal mammary artery chain lymph node.  8 mm enhancing mass left breast 11 o'clock location and mildly irregular area of mass-like enhancement in the left breast two o'clock location, corresponding to those seen at diagnostic examination earlier today.  Biopsy has been scheduled for today and will be dictated separately.  RECOMMENDATION: Treatment plan  THREE-DIMENSIONAL MR IMAGE RENDERING ON INDEPENDENT WORKSTATION:  Three-dimensional MR images were rendered by post-processing of the original MR data on an independent workstation.  The three- dimensional MR images were interpreted, and findings were reported in the accompanying complete MRI report for this study.  BI-RADS CATEGORY 6:  Known biopsy-proven malignancy - appropriate action should be  taken.   Original Report Authenticated By: Christiana Pellant, M.D.   Nm Pet Image Initial (pi) Skull Base To Thigh  01/20/2013   *RADIOLOGY REPORT*  Clinical Data: Initial treatment strategy for breast cancer.  NUCLEAR MEDICINE PET SKULL BASE TO THIGH  Fasting Blood Glucose:  263  Technique:  19.1 mCi F-18 FDG was injected intravenously. CT data was obtained and used for attenuation correction and anatomic localization only.  (This was not acquired as a diagnostic CT examination.) Additional exam technical data entered on technologist worksheet.  Comparison:  None  Findings:  Neck: No hypermetabolic lymph nodes in the neck.  Chest:  No hypermetabolic mediastinal or hilar nodes.  No suspicious pulmonary nodules on the CT scan. Enlarged right axillary lymph nodes identified.  Index lymph node measures 1.7 cm and has an SUV max equal to 2.8, image 77.  There is asymmetric increased uptake throughout the right breast with associated overlying skin thickening.  This has an SUV max equal to 4.4, image 101.  Abdomen/Pelvis:  No abnormal hypermetabolic activity within the liver, pancreas, adrenal glands, or spleen.  No hypermetabolic lymph nodes in the abdomen or pelvis. There is a small focus of increased FDG uptake within the left ovary.  This has an SUV max equal to 13.1, image 217.  Skeleton:  No focal hypermetabolic activity to suggest skeletal metastasis.  IMPRESSION:  1.  Asymmetric increased uptake associated with the right breast and right breast skin thickening corresponding to biopsy-proven invasive breast cancer. 2.  Enlarged and hypermetabolic right axillary lymph nodes consistent with metastatic adenopathy. 3.  No evidence for distant metastatic disease. 4.  Focal area of increased uptake within the left axilla.  The patient is noted to have a corpus luteal cyst are not diagnostic CT images.  This activity is favored to represent physiologic activity.  Consider confirmatory imaging with pelvic sonogram.    Original Report Authenticated By: Signa Kell, M.D.   Ir Fluoro Guide Cv Line Left  01/15/2013   *RADIOLOGY REPORT*  Clinical Data/Indication: Breast cancer  TUNNEL POWER PORT PLACEMENT WITH SUBCUTANEOUS POCKET UTILIZING ULTRASOUND & FLOUROSCOPY  Sedation: Versed 5.0 mg, Fentanyl 250 mcg.  Total Moderate Sedation Time: 35 minutes.  As antibiotic prophylaxis, Ancef  was ordered pre-procedure and administered intravenously  within one hour of incision.  Fluoroscopy Time: 36 seconds.  Procedure:  After written informed consent was obtained, patient was placed in the supine position on angiographic table. The left neck and chest was prepped and draped in a sterile fashion. Lidocaine was utilized for local anesthesia.  The left internal jugular vein was noted to be patent initially with ultrasound. Under sonographic guidance, a micropuncture needle was inserted into the left IJ vein (Ultrasound and fluoroscopic image documentation was performed). The needle was removed over an 018 wire which was exchanged for a Amplatz.  This was advanced into the IVC.  An 8-French dilator was advanced over the Amplatz.  A small incision was made in the left upper chest over the anterior left second rib.  Utilizing blunt dissection, a subcutaneous pocket was created in the caudal direction. The pocket was irrigated with a copious amount of sterile normal saline.  The port catheter was tunneled from the chest incision, and out the neck incision.  The reservoir was inserted into the subcutaneous pocket and secured with two 3-0 Ethilon stitches.  A peel-away sheath was advanced over the Amplatz wire.  The port catheter was cut to measure length and inserted through the peel-away sheath.  The peel-away sheath was removed.  The chest incision was closed with 3-0 Vicryl interrupted stitches for the subcutaneous tissue and a running of 4- 0 Vicryl subcuticular stitch for the skin.  The neck incision was closed with a 4-0 Vicryl subcuticular  stitch.  Derma-bond was applied to both surgical incisions.  The port reservoir was flushed and instilled with heparinized saline.  No complications.  Findings:  A left IJ vein Port-A-Cath is in place with its tip at the cavoatrial junction.  IMPRESSION: Successful 8 French left internal jugular vein power port placement with its tip at the SVC/RA junction.   Original Report Authenticated By: Jolaine Click, M.D.   Ir US Guide Vasc Access Left  01/15/2013   *RADIOLOGY REPORT*  Clinical Data/Indication: Breast cancer  TUNNEL POWER PORT PLACEMENT WITH SUBCUTANEOUS POCKET UTILIZING ULTRASOUND & FLOUROSCOPY  Sedation: Versed 5.0 mg, Fentanyl 250 mcg.  Total Moderate Sedation Time: 35 minutes.  As antibiotic prophylaxis, Ancef  was ordered pre-procedure and administered intravenously within one hour of incision.  Fluoroscopy Time: 36 seconds.  Procedure:  After written informed consent was obtained, patient was placed in the supine position on angiographic table. The left neck and chest was prepped and draped in a sterile fashion. Lidocaine was utilized for local anesthesia.  The left internal jugular vein was noted to be patent initially with ultrasound. Under sonographic guidance, a micropuncture needle was inserted into the left IJ vein (Ultrasound and fluoroscopic image documentation was performed). The needle was removed over an 018 wire which was exchanged for a Amplatz.  This was advanced into the IVC.  An 8-French dilator was advanced over the Amplatz.  A small incision was made in the left upper chest over the anterior left second rib.  Utilizing blunt dissection, a subcutaneous pocket was created in the caudal direction. The pocket was irrigated with a copious amount of sterile normal saline.  The port catheter was tunneled from the chest incision, and out the neck incision.  The reservoir was inserted into the subcutaneous pocket and secured with two 3-0 Ethilon stitches.  A peel-away sheath was advanced over  the Amplatz wire.  The port catheter was cut to measure length and inserted through the peel-away sheath.  The peel-away sheath was removed.  The chest incision was closed with 3-0 Vicryl interrupted stitches for the subcutaneous tissue and a running of 4- 0 Vicryl subcuticular stitch for the skin.  The neck incision was closed with a 4-0 Vicryl subcuticular stitch.  Derma-bond was applied to both surgical incisions.  The port reservoir was flushed and instilled with heparinized saline.  No complications.  Findings:  A left IJ vein Port-A-Cath is in place with its tip at the cavoatrial junction.  IMPRESSION: Successful 8 French left internal jugular vein power port placement with its tip at the SVC/RA junction.   Original Report Authenticated By: Jolaine Click, M.D.   Mm Digital Diagnostic Unilat L  01/09/2013   *RADIOLOGY REPORT*  Clinical Data:  Evaluate clip placement following two separate ultrasound-guided left breast biopsies.  DIGITAL DIAGNOSTIC LEFT MAMMOGRAM  Comparison:  Previous exams  Findings:  Films are performed following two separate ultrasound guided biopsies of the left breast - at the 11 o'clock position and 2 o'clock position. The ribbon shaped biopsy clip corresponds to the 11 o'clock mass and the T shaped biopsy clip corresponds to the 2 o'clock mass. No immediate complications are identified.  IMPRESSION: Satisfactory clip placement following ultrasound guided left breast biopsies.  Pathology will be followed.   Original Report Authenticated By: Harmon Pier, M.D.   Mm Digital Diagnostic Unilat L  01/09/2013   *RADIOLOGY REPORT*  Clinical Data:  47 year old female with newly diagnosed right breast cancer.  Left mammogram to compare to presurgical MRI.  DIGITAL DIAGNOSTIC LEFT MAMMOGRAM WITH CAD AND LEFT BREAST ULTRASOUND:  Comparison:  06/11/2012 mammogram and 01/09/2013 MRI  Findings:  ACR Breast Density Category 3: The breast tissue is heterogeneously dense.  A possible circumscribed mass  in the upper outer left breast is identified. No suspicious calcifications or distortion identified.  Mammographic images were processed with CAD.  On physical exam, no palpable abnormalities identified within the upper left breast.  Ultrasound is performed, showing a 6 x 6 x 8 mm slightly irregular hypoechoic mass at the 11 o'clock position of the left breast 5 cm from the nipple An 11 x 7 x 9 mm slightly irregular hypoechoic mass at the 2 o'clock position of the left breast 4 cm from the nipple is also present. No enlarged or abnormal appearing left axillary lymph nodes are identified.  IMPRESSION: Indeterminate 6 x 6 x 8 mm hypoechoic mass at the 11 o'clock position of the left breast and 11 x 7 x 9 mm hypoechoic mass in the 2 o'clock position of the left breast.  The 11 o'clock position mass probably represents the enhancing nodule identified on today's MRI.  Tissue sampling of both masses is recommended.  BI-RADS CATEGORY 4:  Suspicious abnormality - biopsy should be considered.  RECOMMENDATION: Ultrasound guided biopsies of both left breast masses.  The findings and recommendations were discussed with the patient and she desires to proceed with ultrasound guided left breast biopsies. These biopsies will be performed today but dictated in a separate report.  Results were also provided in writing at the conclusion of the visit.  If applicable, a reminder letter will be sent to the patient regarding the next appointment.   Original Report Authenticated By: Harmon Pier, M.D.   [place] Lt Breast Bx W Loc Dev 1st Lesion Img Bx Spec US Guide  01/12/2013   *RADIOLOGY REPORT*  Clinical Data:  47 year old female with newly diagnosed right breast cancer.  Two left breast masses identified sonographically - 6 x 8 mm at the 11  o'clock position and 9 x 11 mm at the 2 o'clock position. For tissue sampling of the 6 x 8 mm mass at the 11 o'clock position.  ULTRASOUND GUIDED VACUUM ASSISTED CORE BIOPSY OF THE LEFT BREAST   Comparison: Previous exams.  I met with the patient and we discussed the procedure of ultrasound- guided biopsy, including benefits and alternatives.  We discussed the high likelihood of a successful procedure. We discussed the risks of the procedure including infection, bleeding, tissue injury, clip migration, and inadequate sampling.  Informed written consent was given.  Using sterile technique and 2% Lidocaine as local anesthetic, under direct ultrasound visualization, a12 gauge vacuum-assisteddevice was used to perform biopsy of the 6 x 8 mm hypoechoic mass at the 11 o'clock position of the left breast 5 cm from the nipple using a medial approach. At the conclusion of the procedure, a ribbon shaped tissue marker clip was deployed into the biopsy cavity. Follow-up 2-view mammogram was performed and dictated separately.  The usual time-out protocol was performed immediately prior to the procedure.  IMPRESSION: Ultrasound-guided biopsy of 6 x 8 mm hypoechoic mass at the 11 o'clock position of the left breast.  No apparent complications.  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  Histology correlates with imaging findings.  The patient was contacted by phone on 01/12/2013 and these results given to her which she understood. Her questions were answered. The patient had no complaints with her biopsy site.  Recommend treatment plan.   Original Report Authenticated By: Harmon Pier, M.D.   [place] Lt Breast Bx W Loc Dev Ea Add Lesion Img Bx Spec US Guide  01/12/2013   *RADIOLOGY REPORT*  Clinical Data:  47 year old female with newly diagnosed right breast cancer.  Two left breast masses identified sonographically - 6 x 8 mm at the 11 o'clock position and 9 x 11 mm at the 2 o'clock position. For tissue sampling of the 9 x 11 mm mass at the 2 o'clock position.  ULTRASOUND GUIDED VACUUM ASSISTED CORE BIOPSY OF THE LEFT BREAST  Comparison: Previous exams.  I met with  the patient and we discussed the procedure of ultrasound- guided biopsy, including benefits and alternatives.  We discussed the high likelihood of a successful procedure. We discussed the risks of the procedure including infection, bleeding, tissue injury, clip migration, and inadequate sampling.  Informed written consent was given.  Using sterile technique and 2% Lidocaine as local anesthetic, under direct ultrasound visualization, a 12 gauge vacuum-assisteddevice was used to perform biopsy of the 9 x 11 mm hypoechoic mass at the 2 o'clock position of the left breast 4 cm from the nipple using a medial approach. At the conclusion of the procedure, a  tissue marker clip was deployed into the biopsy cavity.  Follow-up 2-view mammogram was performed and dictated separately.  The usual time-out protocol was performed immediately prior to the procedure.  IMPRESSION: Ultrasound-guided biopsy of 9 x 11 mm mass in the 2 o'clock position of the left breast.  No apparent complications.  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 Histology correlates with imaging findings.  The patient was contacted by phone on 01/12/2013 and these results given to her which she understood. Her questions were answered. The patient had no complaints with her biopsy site.  Recommend treatment plan.   Original Report Authenticated By: Harmon Pier, M.D.    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 (T3 N1) Patient was administered adjuvant chemotherapy consisting of Taxotere carboplatinum Herceptin and perjeta x6 cycles from 01/16/2013 through 05/01/2013. Overall she tolerated it well.  #2 patient is now status post bilateral mastectomies on 06/09/2013 with the final pathology revealing no evidence of residual carcinoma in the right breast 14 lymph  nodes were negative for metastatic disease. With the final pathologic staging at T0 N0 with complete pathologic response to chemotherapy.  #3 patient to proceed with adjuvant Herceptin every 3 weeks to complete one year. She is followed by cardiology as well for ongoing echocardiograms and evaluation for Herceptin induced cardio toxicity.  Her last appointment was on 05/27/13 and she was recommended to proceed with herceptin with 3 month follow up.    #4 patient will not receive antiestrogen therapy since her tumor is estrogen receptor and progesterone receptor negative  PLAN:   #1 Mrs. Bonneau will proceed with Herceptin today.   #2 She will take Gabapentin 200mg  BID for her neuropathy.  #3 She will return in 3 weeks for her next appointment.    All questions were answered. The patient knows to call the clinic with any problems, questions or concerns. We can certainly see the patient much sooner if necessary.  I spent 25 minutes counseling the patient face to face. The total time spent in the appointment was 30 minutes.

## 2013-08-18 ENCOUNTER — Other Ambulatory Visit (HOSPITAL_BASED_OUTPATIENT_CLINIC_OR_DEPARTMENT_OTHER): Payer: BC Managed Care – PPO

## 2013-08-18 ENCOUNTER — Ambulatory Visit (HOSPITAL_BASED_OUTPATIENT_CLINIC_OR_DEPARTMENT_OTHER): Payer: BC Managed Care – PPO | Admitting: Adult Health

## 2013-08-18 ENCOUNTER — Encounter: Payer: Self-pay | Admitting: Adult Health

## 2013-08-18 ENCOUNTER — Ambulatory Visit (HOSPITAL_BASED_OUTPATIENT_CLINIC_OR_DEPARTMENT_OTHER): Payer: BC Managed Care – PPO

## 2013-08-18 VITALS — BP 118/77 | HR 108 | Temp 98.0°F | Resp 18 | Ht 63.0 in | Wt 145.0 lb

## 2013-08-18 DIAGNOSIS — C50419 Malignant neoplasm of upper-outer quadrant of unspecified female breast: Secondary | ICD-10-CM

## 2013-08-18 DIAGNOSIS — Z171 Estrogen receptor negative status [ER-]: Secondary | ICD-10-CM

## 2013-08-18 DIAGNOSIS — C50911 Malignant neoplasm of unspecified site of right female breast: Secondary | ICD-10-CM

## 2013-08-18 DIAGNOSIS — C773 Secondary and unspecified malignant neoplasm of axilla and upper limb lymph nodes: Secondary | ICD-10-CM

## 2013-08-18 DIAGNOSIS — Z5112 Encounter for antineoplastic immunotherapy: Secondary | ICD-10-CM

## 2013-08-18 LAB — COMPREHENSIVE METABOLIC PANEL
ALK PHOS: 71 U/L (ref 39–117)
ALT: 21 U/L (ref 0–35)
AST: 22 U/L (ref 0–37)
Albumin: 3.6 g/dL (ref 3.5–5.2)
BILIRUBIN TOTAL: 0.3 mg/dL (ref 0.3–1.2)
BUN: 19 mg/dL (ref 6–23)
CO2: 24 meq/L (ref 19–32)
CREATININE: 0.68 mg/dL (ref 0.50–1.10)
Calcium: 9.3 mg/dL (ref 8.4–10.5)
Chloride: 101 mEq/L (ref 96–112)
GLUCOSE: 201 mg/dL — AB (ref 70–99)
Potassium: 4.3 mEq/L (ref 3.5–5.3)
Sodium: 139 mEq/L (ref 135–145)
Total Protein: 6.9 g/dL (ref 6.0–8.3)

## 2013-08-18 LAB — CBC WITH DIFFERENTIAL/PLATELET
BASO%: 0.2 % (ref 0.0–2.0)
Basophils Absolute: 0 10*3/uL (ref 0.0–0.1)
EOS ABS: 0.2 10*3/uL (ref 0.0–0.5)
EOS%: 2.8 % (ref 0.0–7.0)
HEMATOCRIT: 37.6 % (ref 34.8–46.6)
HGB: 12.7 g/dL (ref 11.6–15.9)
LYMPH%: 22.3 % (ref 14.0–49.7)
MCH: 28.3 pg (ref 25.1–34.0)
MCHC: 33.8 g/dL (ref 31.5–36.0)
MCV: 83.9 fL (ref 79.5–101.0)
MONO#: 0.5 10*3/uL (ref 0.1–0.9)
MONO%: 7.3 % (ref 0.0–14.0)
NEUT%: 67.4 % (ref 38.4–76.8)
NEUTROS ABS: 4.1 10*3/uL (ref 1.5–6.5)
Platelets: 239 10*3/uL (ref 145–400)
RBC: 4.48 10*6/uL (ref 3.70–5.45)
RDW: 13.4 % (ref 11.2–14.5)
WBC: 6.1 10*3/uL (ref 3.9–10.3)
lymph#: 1.4 10*3/uL (ref 0.9–3.3)

## 2013-08-18 MED ORDER — TRASTUZUMAB CHEMO INJECTION 440 MG
6.0000 mg/kg | Freq: Once | INTRAVENOUS | Status: AC
  Administered 2013-08-18: 399 mg via INTRAVENOUS
  Filled 2013-08-18: qty 19

## 2013-08-18 MED ORDER — SODIUM CHLORIDE 0.9 % IJ SOLN
10.0000 mL | INTRAMUSCULAR | Status: DC | PRN
  Administered 2013-08-18: 10 mL
  Filled 2013-08-18: qty 10

## 2013-08-18 MED ORDER — ACETAMINOPHEN 325 MG PO TABS
650.0000 mg | ORAL_TABLET | Freq: Once | ORAL | Status: AC
  Administered 2013-08-18: 650 mg via ORAL

## 2013-08-18 MED ORDER — HEPARIN SOD (PORK) LOCK FLUSH 100 UNIT/ML IV SOLN
500.0000 [IU] | Freq: Once | INTRAVENOUS | Status: AC | PRN
  Administered 2013-08-18: 500 [IU]
  Filled 2013-08-18: qty 5

## 2013-08-18 MED ORDER — SODIUM CHLORIDE 0.9 % IV SOLN
Freq: Once | INTRAVENOUS | Status: AC
  Administered 2013-08-18: 16:00:00 via INTRAVENOUS

## 2013-08-18 MED ORDER — DIPHENHYDRAMINE HCL 25 MG PO CAPS
50.0000 mg | ORAL_CAPSULE | Freq: Once | ORAL | Status: AC
  Administered 2013-08-18: 50 mg via ORAL

## 2013-08-18 MED ORDER — ACETAMINOPHEN 325 MG PO TABS
ORAL_TABLET | ORAL | Status: AC
  Filled 2013-08-18: qty 2

## 2013-08-18 NOTE — Patient Instructions (Signed)
[facility] Discharge Instructions for Patients Receiving Chemotherapy  Today you received the following chemotherapy agents Herceptin.  To help prevent nausea and vomiting after your treatment, we encourage you to take your nausea medication as prescribed.   If you develop nausea and vomiting that is not controlled by your nausea medication, call the clinic.   BELOW ARE SYMPTOMS THAT SHOULD BE REPORTED IMMEDIATELY:  *FEVER GREATER THAN 100.5 F  *CHILLS WITH OR WITHOUT FEVER  NAUSEA AND VOMITING THAT IS NOT CONTROLLED WITH YOUR NAUSEA MEDICATION  *UNUSUAL SHORTNESS OF BREATH  *UNUSUAL BRUISING OR BLEEDING  TENDERNESS IN MOUTH AND THROAT WITH OR WITHOUT PRESENCE OF ULCERS  *URINARY PROBLEMS  *BOWEL PROBLEMS  UNUSUAL RASH Items with * indicate a potential emergency and should be followed up as soon as possible.  Feel free to call the clinic you have any questions or concerns. The clinic phone number is [phone].    

## 2013-08-18 NOTE — Progress Notes (Signed)
OFFICE PROGRESS NOTE  CC  [name], MD [address] [place] [address] Dr. Thea Silversmith  Dr. 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 At that time she had mammogram performed and it was negative. However her breast continue to increase in size. Initially patient felt that it could have been cysts or something on and therefore she did not go to a physician. However subsequently patient's breast became quite painful and she also began to feel in knot under her right arm.because of this she went to her physician who did a physical exam and a mammogram was performed on 01/01/2013 the mammogram showed diffuse skin thickening generalized increased density within the right breast and enlarged right axillary lymph nodes. She went on to have an ultrasound of the right breast performed that showed a large spiculated hypoechoic mass in the right breast 11:00 2 cm from the nipple measuring at least 4.5 cm in size. There were abnormal lymph nodes with thickened cortex and the right axilla the largest one measuring 3.1 cm in dimension.  #2Patient was recommended ultrasound guided core biopsy of the right breast mass and the right axilla lymph node. The biopsy was performed on 01/01/2013. The right needle core biopsy of the primary mass showed invasive mammary carcinoma grade 2-3 was invasive ductal. The lymph node of the right axilla was positive for metastatic disease. The tumor was ER PR negative HER-2/neu positive with a Ki-67 of 76%. On 01/09/2013 patient had MRI of the breasts performed that showed diffuse right breast skin thickening and edema with right axillary lymph node. There was a 5 mm internal mammary lymph node that  was incompletely imaged. The right breast mass measured 8 x 5 x 4.7 cm. There were abnormal areas involving all 4 quadrants. There was however no involvement of the pectoralis minor for lower chest wall. The left breast showed 2 areas of nodular enhancement these were biopsied and were fibroadenomas  #3 it was recommended that patient begin neoadjuvant chemotherapy consisting of Taxotere carboplatinum Herceptin and perjeta. This would be given every 3 weeks with day 2 Neulasta. A total of 6 cycles of therapy were planned. Once she completes the treatment then she would proceed with MRI of the breasts again for evaluation a response to therapy and eventually a mastectomy.  #4 patient is status post Neoadjuvant with curative intent Taxotere carboplatinum Herceptin and perjeta starting 01/16/2013 - 05/01/2013.  #5 patient is status post bilateral mastectomies with the left breast revealing no evidence of cancer. Right breast reveals no residual carcinoma 14 right axillary lymph nodes were negative for metastatic disease.  #6 patient began adjuvant Herceptin starting 06/16/2013. Patient will complete out 1 year of adjuvant Herceptin.   CURRENT THERAPY:  Adjuvant herceptin beginning 06/16/13  INTENT: Curative  INTERVAL HISTORY: Rida Loudin 48 y.o. female returns for followup visit today.  She is doing well.  She is doing well today.  She denies fevers, chills, nausea, vomiting, constipation, diarrhea, numbness, shortness of breath, DOE, fatigue, orthopnea, PND, swelling, or any other concerns.  A 10 point ROS is negative.    MEDICAL HISTORY: Past Medical History  Diagnosis Date  . Anxiety   . Depression   . Dyslipidemia   .  Anxiety and depression 07/06/2011  . Hx of insertion of insulin pump ~ 2001 to present (06/09/2013)  . PONV (postoperative nausea and vomiting)   . Venous insufficiency   . Lower extremity edema   . Diabetes mellitus     IDDM  . Type I diabetes mellitus     uses insulin  pump  . Breast cancer 01/01/13    /metastatic 1/1 lymph nodes    ALLERGIES:  has No Known Allergies.  MEDICATIONS:  Current Outpatient Prescriptions  Medication Sig Dispense Refill  . glucose blood (NOVA MAX TEST) test strip Use as directed up to 8 times a day      . Insulin Infusion Pump (MINIMED INSULIN PUMP) DEVI Inject 1 Units/hr into the vein continuous. Pt give extra per cards at meal times. Basic meals are about 5 units      . Insulin Infusion Pump Supplies (MINIMED INFUSION SET-MMT 397) MISC 1 Device by Does not apply route every 3 (three) days.      . Insulin Infusion Pump Supplies (PARADIGM PUMP RESERVOIR 1.76ML) MISC 1 Device by Does not apply route every 3 (three) days.      . insulin lispro (HUMALOG) 100 UNIT/ML injection Inject as directed into Insulin pump, total of 40 units/day  20 mL  2  . LORazepam (ATIVAN) 0.5 MG tablet Take 0.5 mg by mouth every 8 (eight) hours as needed for anxiety.      . cyanocobalamin 500 MCG tablet Take 500 mcg by mouth daily.      Marland Kitchen UNABLE TO FIND Rx: Q0347- Mastectomy Form, Bilateral (Quantity: 2) L8000- Mastectomy Bra (Quantity: 2) Dx: 174.9; Bilateral Mastectomy  1 each  0  . UNABLE TO FIND Rx: Q2595- Post Mastectomy Garment (Quantity: 2) Dx: 174.9; Bilateral Mastectomy  1 each  0   No current facility-administered medications for this visit.    SURGICAL HISTORY:  Past Surgical History  Procedure Laterality Date  . Lumbar disc surgery  2009    Dr Maxie Better  . Cesarean section  1989  . Tubal ligation  1991  . Breast biopsy Right 01/01/13    Invasive mammary ca,metastatic in 1/1 lymph node  . Breast biopsy Left 01/09/13    Fibroadenoma,microcalcifications  . Portacath placement Left 01/2013  . Mastectomy modified radical Right 06/09/2013  . Mastectomy complete / simple Left 06/09/2013  . Mastectomy modified radical Right 06/09/2013    Procedure: RIGHT MASTECTOMY MODIFIED RADICAL;  Surgeon: Adin Hector, MD;  Location: [facility];  Service:  General;  Laterality: Right;  . Simple mastectomy with axillary sentinel node biopsy Left 06/09/2013    Procedure: LEFT SIMPLE MASTECTOMY;  Surgeon: Adin Hector, MD;  Location: [facility];  Service: General;  Laterality: Left;    REVIEW OF SYSTEMS:  A 10 point review of systems was conducted and is otherwise negative except for what is noted above.    PHYSICAL EXAMINATION: Blood pressure 118/77, pulse 108, temperature 98 F (36.7 C), temperature source Oral, resp. rate 18, height 5' 3" (1.6 m), weight 145 lb (65.772 kg). Body mass index is 25.69 kg/(m^2). GENERAL: Patient is a well appearing female in no acute distress HEENT:  Sclerae anicteric.  Oropharynx clear and moist. No ulcerations or evidence of oropharyngeal candidiasis. Neck is supple.  NODES:  No cervical, supraclavicular, or axillary lymphadenopathy palpated.  BREAST EXAM:  Deferred. LUNGS:  Clear to auscultation bilaterally.  No wheezes or rhonchi. HEART:  Regular rate and rhythm. No murmur appreciated. ABDOMEN:  Soft, nontender.  Positive, normoactive bowel sounds. No organomegaly palpated. MSK:  No focal spinal tenderness to palpation. Full range of motion bilaterally in the upper extremities. EXTREMITIES:  No peripheral edema.   SKIN:  Clear with no obvious rashes or skin changes. No nail dyscrasia. NEURO:  Nonfocal. Well oriented.  Appropriate affect. ECOG PERFORMANCE STATUS: 0 - Asymptomatic  LABORATORY DATA: Lab Results  Component Value Date   WBC 6.1 08/18/2013   HGB 12.7 08/18/2013   HCT 37.6 08/18/2013   MCV 83.9 08/18/2013   PLT 239 08/18/2013      Chemistry      Component Value Date/Time   NA 139 08/18/2013 1501   NA 137 07/28/2013 0946   K 4.3 08/18/2013 1501   K 4.4 07/28/2013 0946   CL 101 08/18/2013 1501   CL 99 01/23/2013 1251   CO2 24 08/18/2013 1501   CO2 27 07/28/2013 0946   BUN 19 08/18/2013 1501   BUN 17.7 07/28/2013 0946   CREATININE 0.68 08/18/2013 1501   CREATININE 0.7 07/28/2013 0946      Component  Value Date/Time   CALCIUM 9.3 08/18/2013 1501   CALCIUM 9.6 07/28/2013 0946   ALKPHOS 71 08/18/2013 1501   ALKPHOS 65 07/28/2013 0946   AST 22 08/18/2013 1501   AST 21 07/28/2013 0946   ALT 21 08/18/2013 1501   ALT 17 07/28/2013 0946   BILITOT 0.3 08/18/2013 1501   BILITOT 0.40 07/28/2013 0946     Pathology 06/09/2013:  Diagnosis 1. Breast, simple mastectomy, Left - FIBROCYSTIC CHANGE WITH ADENOSIS AND CALCIFICATIONS. - NO MALIGNANCY IDENTIFIED. 2. Breast, modified radical mastectomy , Right - BENIGN BREAST TISSUE WITH EXTENSIVE FIBROSIS CONSISTENT WITH TREATMENT EFFECT. - NO RESIDUAL CARCINOMA IDENTIFIED. - FOURTEEN LYMPH NODES NEGATIVE FOR CARCINOMA WITH FOCAL HYLANIZATION (0/14). - SEE ONCOLOGY TABLE AND COMMENT. Microscopic Comment 2. BREAST, INVASIVE TUMOR, WITH LYMPH NODE SAMPLING Specimen, including laterality and lymph node sampling (sentinel, non-sentinel): Right breast and axillary contents. Procedure: Right modified radical mastectomy. Histologic type, tumor grade, tumor size, margins, lympovascular invasion, tumor focality, and extent of tumor can not be assessed, as there is no residual carcinoma. Ductal carcinoma in situ: Absent. Lobular neoplasia: Absent. Treatment effect: Present, extensive. If present, treatment effect in breast tissue, lymph nodes or both: Both. Lymph nodes: Examined: 14 Total Lymph nodes with metastasis: 0 Isolated tumor cells (< 0.2 mm): 0 Micrometastasis: (> 0.2 mm and < 2.0 mm): 0 Macrometastasis: (> 2.0 mm): 0 Extracapsular extension: 0 Breast prognostic profile: [id number] Estrogen receptor: Negative. Progesterone receptor: Negative. Her 2 neu: Amplified. Ki-67: 76% Non-neoplastic breast: Fibrocystic change. 1 of 3 FINAL for [place], NELLUMS [phone]) Microscopic Comment(continued) TNM: ypT0, ypN0 Comments: There is no residual tumor in the current breast specimen, status post neoadjuvant chemotherapy, and therefore it is best  staged as ypT0. Many of the nodes exhibit fibrosis consistent with treatment effect. Immunohistochemistry for pancytokeratin was performed on the two largest lymph nodes and was negative for carcinoma, resulting in a stage of ypN0. Vicente Males MD Pathologist, Electronic Signature (Case signed 06/11/2013) Specimen Gross and Clinical  RADIOGRAPHIC STUDIES:  Ct Chest W Contrast  01/20/2013   *RADIOLOGY REPORT*  Clinical Data:  High risk breast cancer.  Evaluate for metastatic disease.  CT CHEST, ABDOMEN AND PELVIS WITH CONTRAST  Technique:  Multidetector CT imaging of the chest, abdomen and pelvis was performed following the standard protocol during bolus administration of intravenous contrast.  Contrast: 131m OMNIPAQUE IOHEXOL 300 MG/ML  SOLN  Comparison:  None  CT CHEST  Findings:  Lungs/pleura: There is no pleural effusion.  No suspicious pulmonary nodule or mass noted.  Heart/Mediastinum: Heart size is normal.  No pericardial effusion. No mediastinal or hilar adenopathy.  Bones/Musculoskeletal:  Asymmetric increased soft tissue within the right breast parenchyma corresponding to patient's biopsy-proven invasive breast carcinoma.  There is associated overlying skin thickening.  Multiple enlarged right axillary lymph nodes are identified.  Index lymph node measures 1.7 cm, image 23/series 2. Several enhancing, sub centimeter retropectoral lymph nodes are identified.  IMPRESSION:  1.  No acute findings. 2.  Right breast invasive carcinoma with associated right axillary adenopathy.  CT ABDOMEN AND PELVIS  Findings:  There are no focal liver abnormalities identified.  The gallbladder appears normal.  There is no biliary dilatation.  The pancreas is unremarkable.  Normal appearance of the spleen.  The adrenal glands are both normal.  The kidneys are unremarkable. Urinary bladder appears normal.  The uterus is unremarkable. Corpus luteal cyst is noted in the left ovary measuring 1.6 cm.  No free fluid or fluid  collections identified within the upper abdomen or pelvis.  The abdominal aorta has a normal caliber. There is no adenopathy identified within the upper abdomen.  No pelvic or inguinal adenopathy noted.  The stomach is normal.  The small bowel loops are unremarkable. Normal appearance of the colon.  Review of the visualized osseous structures is negative for aggressive lytic or sclerotic bone lesion.  IMPRESSION:  1.  No acute findings within the abdomen or pelvis. 2.  No mass or adenopathy.   Original Report Authenticated By: Kerby Moors, M.D.   US Breast Left  01/09/2013   *RADIOLOGY REPORT*  Clinical Data:  48 year old female with newly diagnosed right breast cancer.  Left mammogram to compare to presurgical MRI.  DIGITAL DIAGNOSTIC LEFT MAMMOGRAM WITH CAD AND LEFT BREAST ULTRASOUND:  Comparison:  06/11/2012 mammogram and 01/09/2013 MRI  Findings:  ACR Breast Density Category 3: The breast tissue is heterogeneously dense.  A possible circumscribed mass in the upper outer left breast is identified. No suspicious calcifications or distortion identified.  Mammographic images were processed with CAD.  On physical exam, no palpable abnormalities identified within the upper left breast.  Ultrasound is performed, showing a 6 x 6 x 8 mm slightly irregular hypoechoic mass at the 11 o'clock position of the left breast 5 cm from the nipple An 11 x 7 x 9 mm slightly irregular hypoechoic mass at the 2 o'clock position of the left breast 4 cm from the nipple is also present. No enlarged or abnormal appearing left axillary lymph nodes are identified.  IMPRESSION: Indeterminate 6 x 6 x 8 mm hypoechoic mass at the 11 o'clock position of the left breast and 11 x 7 x 9 mm hypoechoic mass in the 2 o'clock position of the left breast.  The 11 o'clock position mass probably represents the enhancing nodule identified on today's MRI.  Tissue sampling of both masses is recommended.  BI-RADS CATEGORY 4:  Suspicious abnormality -  biopsy should be considered.  RECOMMENDATION: Ultrasound guided biopsies of both left breast masses.  The findings and recommendations were discussed with the patient and she desires to proceed with ultrasound guided left breast biopsies. These biopsies will be performed today but dictated in a separate report.  Results were also provided in writing at the conclusion of the visit.  If applicable, a reminder letter will be sent to the patient regarding the next appointment.   Original Report Authenticated By: Margarette Canada,  M.D.   Ct Abdomen Pelvis W Contrast  01/22/2013   *RADIOLOGY REPORT*  Clinical Data:  High risk breast cancer.  Evaluate for metastatic disease.  CT CHEST, ABDOMEN AND PELVIS WITH CONTRAST  Technique:  Multidetector CT imaging of the chest, abdomen and pelvis was performed following the standard protocol during bolus administration of intravenous contrast.  Contrast: 161m OMNIPAQUE IOHEXOL 300 MG/ML  SOLN  Comparison:  None  CT CHEST  Findings:  Lungs/pleura: There is no pleural effusion.  No suspicious pulmonary nodule or mass noted.  Heart/Mediastinum: Heart size is normal.  No pericardial effusion. No mediastinal or hilar adenopathy.  Bones/Musculoskeletal:  Asymmetric increased soft tissue within the right breast parenchyma corresponding to patient's biopsy-proven invasive breast carcinoma.  There is associated overlying skin thickening.  Multiple enlarged right axillary lymph nodes are identified.  Index lymph node measures 1.7 cm, image 23/series 2. Several enhancing, sub centimeter retropectoral lymph nodes are identified.  IMPRESSION:  1.  No acute findings. 2.  Right breast invasive carcinoma with associated right axillary adenopathy.  CT ABDOMEN AND PELVIS  Findings:  There are no focal liver abnormalities identified.  The gallbladder appears normal.  There is no biliary dilatation.  The pancreas is unremarkable.  Normal appearance of the spleen.  The adrenal glands are both normal.   The kidneys are unremarkable. Urinary bladder appears normal.  The uterus is unremarkable. Corpus luteal cyst is noted in the left ovary measuring 1.6 cm.  No free fluid or fluid collections identified within the upper abdomen or pelvis.  The abdominal aorta has a normal caliber. There is no adenopathy identified within the upper abdomen.  No pelvic or inguinal adenopathy noted.  The stomach is normal.  The small bowel loops are unremarkable. Normal appearance of the colon.  Review of the visualized osseous structures is negative for aggressive lytic or sclerotic bone lesion.  IMPRESSION:  1.  No acute findings within the abdomen or pelvis. 2.  No mass or adenopathy.   Original Report Authenticated By: TKerby Moors M.D.   Mr Breast Bilateral W Wo Contrast  01/09/2013   *RADIOLOGY REPORT*  Clinical Data: Biopsy-proven right breast inflammatory carcinoma with primary invasive mammary carcinoma and previous biopsy and known right axillary metastatic disease.  Preoperative staging MRI.  BUN and creatinine were obtained on site at [facility] [address] Results:  BUN 10 mg/dL,  Creatinine 0.9 mg/dL.  BILATERAL BREAST MRI WITH AND WITHOUT CONTRAST  Technique: Multiplanar, multisequence MR images of both breasts were obtained prior to and following the intravenous administration of 120mof Multihance.  Three dimensional images were evaluated at the independent DynaCad workstation.  Comparison:  Prior mammograms and ultrasound.  The patient underwent diagnostic mammography and ultrasound of the left breast today as no recent prior exam was available after the recent diagnosis in the right breast.  Findings: There is diffuse right breast skin thickening and edema, trabecular edema, and right axillary lymphadenopathy with maximal nodal measurement 3.1 cm short-axis diameter.  There is also a 5 mm internal mammary artery chain lymph node identified image 26 series 2. There is an incompletely visualized  lymph node subjacent to the pectoralis minor muscle on the first image of the T2-weighted series.  There is a large irregular enhancing mass in the right breast 11 o'clock location with associated clip artifact measuring maximally 8.0 x 5.6 by 4.7 cm. This demonstrates washin/washout type enhancement kinetics.  This corresponds to the biopsy-proven invasive mammary carcinoma.  There  are abnormal nodular areas of trabecular has been throughout the right breast involving all four quadrants. No abnormal underlying pectoralis enhancement or chest wall involvement is identified.  In the left breast 11 o'clock location, there is an 8 mm enhancing nodule corresponding to that seen at the examination earlier today. This area demonstrates plateau type enhancement kinetics.  No left- sided axillary or internal mammary artery chain lymphadenopathy is identified.  There is mild inhomogeneous nodular enhancement in the left breast two o'clock location at the site of the mass seen in this location earlier today, measuring 9 mm, measuring progressive enhancement kinetics.  IMPRESSION: Large irregular enhancing right breast mass 11 o'clock location corresponding to biopsy-proven invasive mammary carcinoma, with metastatic right axillary lymphadenopathy as well as skin and trabecular thickening/enhancement compatible with the clinical diagnosis of inflammatory and/or locally advanced breast cancer.  5 mm abnormal right internal mammary artery chain lymph node.  8 mm enhancing mass left breast 11 o'clock location and mildly irregular area of mass-like enhancement in the left breast two o'clock location, corresponding to those seen at diagnostic examination earlier today.  Biopsy has been scheduled for today and will be dictated separately.  RECOMMENDATION: Treatment plan  THREE-DIMENSIONAL MR IMAGE RENDERING ON INDEPENDENT WORKSTATION:  Three-dimensional MR images were rendered by post-processing of the original MR data on an  independent workstation.  The three- dimensional MR images were interpreted, and findings were reported in the accompanying complete MRI report for this study.  BI-RADS CATEGORY 6:  Known biopsy-proven malignancy - appropriate action should be taken.   Original Report Authenticated By: Conchita Paris, M.D.   Nm Pet Image Initial (pi) Skull Base To Thigh  01/20/2013   *RADIOLOGY REPORT*  Clinical Data: Initial treatment strategy for breast cancer.  NUCLEAR MEDICINE PET SKULL BASE TO THIGH  Fasting Blood Glucose:  263  Technique:  19.1 mCi F-18 FDG was injected intravenously. CT data was obtained and used for attenuation correction and anatomic localization only.  (This was not acquired as a diagnostic CT examination.) Additional exam technical data entered on technologist worksheet.  Comparison:  None  Findings:  Neck: No hypermetabolic lymph nodes in the neck.  Chest:  No hypermetabolic mediastinal or hilar nodes.  No suspicious pulmonary nodules on the CT scan. Enlarged right axillary lymph nodes identified.  Index lymph node measures 1.7 cm and has an SUV max equal to 2.8, image 77.  There is asymmetric increased uptake throughout the right breast with associated overlying skin thickening.  This has an SUV max equal to 4.4, image 101.  Abdomen/Pelvis:  No abnormal hypermetabolic activity within the liver, pancreas, adrenal glands, or spleen.  No hypermetabolic lymph nodes in the abdomen or pelvis. There is a small focus of increased FDG uptake within the left ovary.  This has an SUV max equal to 13.1, image 217.  Skeleton:  No focal hypermetabolic activity to suggest skeletal metastasis.  IMPRESSION:  1.  Asymmetric increased uptake associated with the right breast and right breast skin thickening corresponding to biopsy-proven invasive breast cancer. 2.  Enlarged and hypermetabolic right axillary lymph nodes consistent with metastatic adenopathy. 3.  No evidence for distant metastatic disease. 4.  Focal area  of increased uptake within the left axilla.  The patient is noted to have a corpus luteal cyst are not diagnostic CT images.  This activity is favored to represent physiologic activity.  Consider confirmatory imaging with pelvic sonogram.   Original Report Authenticated By: Kerby Moors, M.D.   Ir Fluoro  Guide Cv Line Left  01/15/2013   *RADIOLOGY REPORT*  Clinical Data/Indication: Breast cancer  TUNNEL POWER PORT PLACEMENT WITH SUBCUTANEOUS POCKET UTILIZING ULTRASOUND & FLOUROSCOPY  Sedation: Versed 5.0 mg, Fentanyl 250 mcg.  Total Moderate Sedation Time: 35 minutes.  As antibiotic prophylaxis, Ancef  was ordered pre-procedure and administered intravenously within one hour of incision.  Fluoroscopy Time: 36 seconds.  Procedure:  After written informed consent was obtained, patient was placed in the supine position on angiographic table. The left neck and chest was prepped and draped in a sterile fashion. Lidocaine was utilized for local anesthesia.  The left internal jugular vein was noted to be patent initially with ultrasound. Under sonographic guidance, a micropuncture needle was inserted into the left IJ vein (Ultrasound and fluoroscopic image documentation was performed). The needle was removed over an 018 wire which was exchanged for a Amplatz.  This was advanced into the IVC.  An 8-French dilator was advanced over the Amplatz.  A small incision was made in the left upper chest over the anterior left second rib.  Utilizing blunt dissection, a subcutaneous pocket was created in the caudal direction. The pocket was irrigated with a copious amount of sterile normal saline.  The port catheter was tunneled from the chest incision, and out the neck incision.  The reservoir was inserted into the subcutaneous pocket and secured with two 3-0 Ethilon stitches.  A peel-away sheath was advanced over the Amplatz wire.  The port catheter was cut to measure length and inserted through the peel-away sheath.  The peel-away  sheath was removed.  The chest incision was closed with 3-0 Vicryl interrupted stitches for the subcutaneous tissue and a running of 4- 0 Vicryl subcuticular stitch for the skin.  The neck incision was closed with a 4-0 Vicryl subcuticular stitch.  Derma-bond was applied to both surgical incisions.  The port reservoir was flushed and instilled with heparinized saline.  No complications.  Findings:  A left IJ vein Port-A-Cath is in place with its tip at the cavoatrial junction.  IMPRESSION: Successful 8 French left internal jugular vein power port placement with its tip at the SVC/RA junction.   Original Report Authenticated By: Marybelle Killings, M.D.   Ir US Guide Vasc Access Left  01/15/2013   *RADIOLOGY REPORT*  Clinical Data/Indication: Breast cancer  TUNNEL POWER PORT PLACEMENT WITH SUBCUTANEOUS POCKET UTILIZING ULTRASOUND & FLOUROSCOPY  Sedation: Versed 5.0 mg, Fentanyl 250 mcg.  Total Moderate Sedation Time: 35 minutes.  As antibiotic prophylaxis, Ancef  was ordered pre-procedure and administered intravenously within one hour of incision.  Fluoroscopy Time: 36 seconds.  Procedure:  After written informed consent was obtained, patient was placed in the supine position on angiographic table. The left neck and chest was prepped and draped in a sterile fashion. Lidocaine was utilized for local anesthesia.  The left internal jugular vein was noted to be patent initially with ultrasound. Under sonographic guidance, a micropuncture needle was inserted into the left IJ vein (Ultrasound and fluoroscopic image documentation was performed). The needle was removed over an 018 wire which was exchanged for a Amplatz.  This was advanced into the IVC.  An 8-French dilator was advanced over the Amplatz.  A small incision was made in the left upper chest over the anterior left second rib.  Utilizing blunt dissection, a subcutaneous pocket was created in the caudal direction. The pocket was irrigated with a copious amount of  sterile normal saline.  The port catheter was tunneled from the chest  incision, and out the neck incision.  The reservoir was inserted into the subcutaneous pocket and secured with two 3-0 Ethilon stitches.  A peel-away sheath was advanced over the Amplatz wire.  The port catheter was cut to measure length and inserted through the peel-away sheath.  The peel-away sheath was removed.  The chest incision was closed with 3-0 Vicryl interrupted stitches for the subcutaneous tissue and a running of 4- 0 Vicryl subcuticular stitch for the skin.  The neck incision was closed with a 4-0 Vicryl subcuticular stitch.  Derma-bond was applied to both surgical incisions.  The port reservoir was flushed and instilled with heparinized saline.  No complications.  Findings:  A left IJ vein Port-A-Cath is in place with its tip at the cavoatrial junction.  IMPRESSION: Successful 8 French left internal jugular vein power port placement with its tip at the SVC/RA junction.   Original Report Authenticated By: Marybelle Killings, M.D.   Mm Digital Diagnostic Unilat L  01/09/2013   *RADIOLOGY REPORT*  Clinical Data:  Evaluate clip placement following two separate ultrasound-guided left breast biopsies.  DIGITAL DIAGNOSTIC LEFT MAMMOGRAM  Comparison:  Previous exams  Findings:  Films are performed following two separate ultrasound guided biopsies of the left breast - at the 11 o'clock position and 2 o'clock position. The ribbon shaped biopsy clip corresponds to the 11 o'clock mass and the T shaped biopsy clip corresponds to the 2 o'clock mass. No immediate complications are identified.  IMPRESSION: Satisfactory clip placement following ultrasound guided left breast biopsies.  Pathology will be followed.   Original Report Authenticated By: Margarette Canada, M.D.   Mm Digital Diagnostic Unilat L  01/09/2013   *RADIOLOGY REPORT*  Clinical Data:  48 year old female with newly diagnosed right breast cancer.  Left mammogram to compare to presurgical MRI.   DIGITAL DIAGNOSTIC LEFT MAMMOGRAM WITH CAD AND LEFT BREAST ULTRASOUND:  Comparison:  06/11/2012 mammogram and 01/09/2013 MRI  Findings:  ACR Breast Density Category 3: The breast tissue is heterogeneously dense.  A possible circumscribed mass in the upper outer left breast is identified. No suspicious calcifications or distortion identified.  Mammographic images were processed with CAD.  On physical exam, no palpable abnormalities identified within the upper left breast.  Ultrasound is performed, showing a 6 x 6 x 8 mm slightly irregular hypoechoic mass at the 11 o'clock position of the left breast 5 cm from the nipple An 11 x 7 x 9 mm slightly irregular hypoechoic mass at the 2 o'clock position of the left breast 4 cm from the nipple is also present. No enlarged or abnormal appearing left axillary lymph nodes are identified.  IMPRESSION: Indeterminate 6 x 6 x 8 mm hypoechoic mass at the 11 o'clock position of the left breast and 11 x 7 x 9 mm hypoechoic mass in the 2 o'clock position of the left breast.  The 11 o'clock position mass probably represents the enhancing nodule identified on today's MRI.  Tissue sampling of both masses is recommended.  BI-RADS CATEGORY 4:  Suspicious abnormality - biopsy should be considered.  RECOMMENDATION: Ultrasound guided biopsies of both left breast masses.  The findings and recommendations were discussed with the patient and she desires to proceed with ultrasound guided left breast biopsies. These biopsies will be performed today but dictated in a separate report.  Results were also provided in writing at the conclusion of the visit.  If applicable, a reminder letter will be sent to the patient regarding the next appointment.   Original Report  Authenticated By: Margarette Canada, M.D.   [place] Lt Breast Bx W Loc Dev 1st Lesion Img Bx Spec US Guide  01/12/2013   *RADIOLOGY REPORT*  Clinical Data:  48 year old female with newly diagnosed right breast cancer.  Two left breast masses  identified sonographically - 6 x 8 mm at the 11 o'clock position and 9 x 11 mm at the 2 o'clock position. For tissue sampling of the 6 x 8 mm mass at the 11 o'clock position.  ULTRASOUND GUIDED VACUUM ASSISTED CORE BIOPSY OF THE LEFT BREAST  Comparison: Previous exams.  I met with the patient and we discussed the procedure of ultrasound- guided biopsy, including benefits and alternatives.  We discussed the high likelihood of a successful procedure. We discussed the risks of the procedure including infection, bleeding, tissue injury, clip migration, and inadequate sampling.  Informed written consent was given.  Using sterile technique and 2% Lidocaine as local anesthetic, under direct ultrasound visualization, a12 gauge vacuum-assisteddevice was used to perform biopsy of the 6 x 8 mm hypoechoic mass at the 11 o'clock position of the left breast 5 cm from the nipple using a medial approach. At the conclusion of the procedure, a ribbon shaped tissue marker clip was deployed into the biopsy cavity. Follow-up 2-view mammogram was performed and dictated separately.  The usual time-out protocol was performed immediately prior to the procedure.  IMPRESSION: Ultrasound-guided biopsy of 6 x 8 mm hypoechoic mass at the 11 o'clock position of the left breast.  No apparent complications.  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  Histology correlates with imaging findings.  The patient was contacted by phone on 01/12/2013 and these results given to her which she understood. Her questions were answered. The patient had no complaints with her biopsy site.  Recommend treatment plan.   Original Report Authenticated By: Margarette Canada, M.D.   [place] Lt Breast Bx W Loc Dev Ea Add Lesion Img Bx Spec US Guide  01/12/2013   *RADIOLOGY REPORT*  Clinical Data:  48 year old female with newly diagnosed right breast cancer.  Two left breast masses identified sonographically - 6 x 8 mm at  the 11 o'clock position and 9 x 11 mm at the 2 o'clock position. For tissue sampling of the 9 x 11 mm mass at the 2 o'clock position.  ULTRASOUND GUIDED VACUUM ASSISTED CORE BIOPSY OF THE LEFT BREAST  Comparison: Previous exams.  I met with the patient and we discussed the procedure of ultrasound- guided biopsy, including benefits and alternatives.  We discussed the high likelihood of a successful procedure. We discussed the risks of the procedure including infection, bleeding, tissue injury, clip migration, and inadequate sampling.  Informed written consent was given.  Using sterile technique and 2% Lidocaine as local anesthetic, under direct ultrasound visualization, a 12 gauge vacuum-assisteddevice was used to perform biopsy of the 9 x 11 mm hypoechoic mass at the 2 o'clock position of the left breast 4 cm from the nipple using a medial approach. At the conclusion of the procedure, a  tissue marker clip was deployed into the biopsy cavity.  Follow-up 2-view mammogram was performed and dictated separately.  The usual time-out protocol was performed immediately prior to the procedure.  IMPRESSION: Ultrasound-guided biopsy of 9 x 11 mm mass in the 2 o'clock position of the left breast.  No apparent complications.  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 Histology correlates with imaging findings.  The patient was contacted by phone on 01/12/2013 and these results given to her which she understood. Her questions were answered. The patient had no complaints with her biopsy site.  Recommend treatment plan.   Original Report Authenticated By: Margarette Canada, M.D.    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 (T3 N1) Patient was administered adjuvant chemotherapy consisting of Taxotere carboplatinum Herceptin and perjeta x6  cycles from 01/16/2013 through 05/01/2013. Overall she tolerated it well.  #2 patient is now status post bilateral mastectomies on 06/09/2013 with the final pathology revealing no evidence of residual carcinoma in the right breast 14 lymph nodes were negative for metastatic disease. With the final pathologic staging at T0 N0 with complete pathologic response to chemotherapy.  #3 patient to proceed with adjuvant Herceptin every 3 weeks to complete one year. She is followed by cardiology as well for ongoing echocardiograms and evaluation for Herceptin induced cardio toxicity.  Her last appointment was on 05/27/13 and she was recommended to proceed with herceptin with 3 month follow up.  This is scheduled on 09/08/13.  #4 patient will not receive antiestrogen therapy since her tumor is estrogen receptor and progesterone receptor negative  PLAN:   #1 Ms. Dicola is doing well and is tolerating Herceptin without difficulty.  Her CBC is stable and I reviewed it with her in detail.  She will proceed with Herceptin today. A CMP is pending.     #2  She will return in 3 weeks for labs, evaluation, and her next Herceptin infusion.    All questions were answered. The patient knows to call the clinic with any problems, questions or concerns. We can certainly see the patient much sooner if necessary.  I spent 15 minutes counseling the patient face to face. The total time spent in the appointment was 30 minutes.  Minette Headland, [place] [phone]

## 2013-08-19 ENCOUNTER — Telehealth: Payer: Self-pay

## 2013-08-19 MED ORDER — INSULIN ASPART 100 UNIT/ML ~~LOC~~ SOLN: SUBCUTANEOUS | Status: DC

## 2013-08-19 NOTE — Telephone Encounter (Signed)
They now prefer novolog, which is very similar. i have sent a prescription to your pharmacy

## 2013-08-19 NOTE — Telephone Encounter (Signed)
Pt informed of changes.

## 2013-08-19 NOTE — Telephone Encounter (Signed)
Pt called stating that her insulin (humalog) is now requiring a PA. I haven't received PA for drug yet. Pt wanted to know if another type of insulin could be used. Please advise, Thanks!

## 2013-08-25 ENCOUNTER — Encounter (HOSPITAL_COMMUNITY): Payer: BC Managed Care – PPO

## 2013-08-28 ENCOUNTER — Telehealth (INDEPENDENT_AMBULATORY_CARE_PROVIDER_SITE_OTHER): Payer: Self-pay

## 2013-08-28 NOTE — Telephone Encounter (Signed)
Patient states [facility] has sent a request for prosthesis , I advise for her to ask them to call with the request .

## 2013-09-03 ENCOUNTER — Telehealth (INDEPENDENT_AMBULATORY_CARE_PROVIDER_SITE_OTHER): Payer: Self-pay | Admitting: General Surgery

## 2013-09-03 NOTE — Telephone Encounter (Signed)
Patient called and stated that she called [facility] twice about her bras and prosthesis. They told the patient that they stated that they did not receive to orders so I called over to [facility] and talked to [place] and told her that we have their orders in our log book and that Dr Dalbert Batman sign off on it. They stated that they did not see the orders or I re-fax the orders and called the patient. And told her if she goes back to the office and ask for Davy or Deneise Lever

## 2013-09-07 ENCOUNTER — Telehealth: Payer: Self-pay | Admitting: *Deleted

## 2013-09-07 NOTE — Telephone Encounter (Signed)
Called patient to ask question, lvm for a return call 

## 2013-09-08 ENCOUNTER — Ambulatory Visit (HOSPITAL_BASED_OUTPATIENT_CLINIC_OR_DEPARTMENT_OTHER)
Admission: RE | Admit: 2013-09-08 | Discharge: 2013-09-08 | Disposition: A | Discharge status: Home / Self Care | Payer: BC Managed Care – PPO | Source: Ambulatory Visit | Attending: Oncology | Admitting: Oncology

## 2013-09-08 ENCOUNTER — Telehealth: Payer: Self-pay | Admitting: *Deleted

## 2013-09-08 ENCOUNTER — Encounter: Payer: Self-pay | Admitting: Adult Health

## 2013-09-08 ENCOUNTER — Telehealth: Payer: Self-pay | Admitting: Oncology

## 2013-09-08 ENCOUNTER — Ambulatory Visit (HOSPITAL_BASED_OUTPATIENT_CLINIC_OR_DEPARTMENT_OTHER): Payer: BC Managed Care – PPO | Admitting: Adult Health

## 2013-09-08 ENCOUNTER — Other Ambulatory Visit (HOSPITAL_BASED_OUTPATIENT_CLINIC_OR_DEPARTMENT_OTHER): Payer: BC Managed Care – PPO

## 2013-09-08 ENCOUNTER — Ambulatory Visit (HOSPITAL_BASED_OUTPATIENT_CLINIC_OR_DEPARTMENT_OTHER): Payer: BC Managed Care – PPO

## 2013-09-08 ENCOUNTER — Ambulatory Visit (HOSPITAL_COMMUNITY)
Admission: RE | Admit: 2013-09-08 | Discharge: 2013-09-08 | Disposition: A | Discharge status: Home / Self Care | Payer: BC Managed Care – PPO | Source: Ambulatory Visit | Attending: Oncology | Admitting: Oncology

## 2013-09-08 VITALS — BP 125/82 | HR 109 | Temp 97.7°F | Resp 19 | Ht 63.0 in | Wt 143.6 lb

## 2013-09-08 VITALS — BP 102/58 | HR 94 | Resp 16 | Wt 144.5 lb

## 2013-09-08 DIAGNOSIS — C50919 Malignant neoplasm of unspecified site of unspecified female breast: Secondary | ICD-10-CM | POA: Insufficient documentation

## 2013-09-08 DIAGNOSIS — C50911 Malignant neoplasm of unspecified site of right female breast: Secondary | ICD-10-CM

## 2013-09-08 DIAGNOSIS — C773 Secondary and unspecified malignant neoplasm of axilla and upper limb lymph nodes: Secondary | ICD-10-CM

## 2013-09-08 DIAGNOSIS — E109 Type 1 diabetes mellitus without complications: Secondary | ICD-10-CM | POA: Insufficient documentation

## 2013-09-08 DIAGNOSIS — R600 Localized edema: Secondary | ICD-10-CM

## 2013-09-08 DIAGNOSIS — R609 Edema, unspecified: Secondary | ICD-10-CM

## 2013-09-08 DIAGNOSIS — C50419 Malignant neoplasm of upper-outer quadrant of unspecified female breast: Secondary | ICD-10-CM

## 2013-09-08 DIAGNOSIS — Z171 Estrogen receptor negative status [ER-]: Secondary | ICD-10-CM

## 2013-09-08 DIAGNOSIS — R519 Headache, unspecified: Secondary | ICD-10-CM

## 2013-09-08 DIAGNOSIS — Z9221 Personal history of antineoplastic chemotherapy: Secondary | ICD-10-CM | POA: Insufficient documentation

## 2013-09-08 DIAGNOSIS — E785 Hyperlipidemia, unspecified: Secondary | ICD-10-CM | POA: Insufficient documentation

## 2013-09-08 DIAGNOSIS — Z5112 Encounter for antineoplastic immunotherapy: Secondary | ICD-10-CM

## 2013-09-08 DIAGNOSIS — Z794 Long term (current) use of insulin: Secondary | ICD-10-CM | POA: Insufficient documentation

## 2013-09-08 DIAGNOSIS — I059 Rheumatic mitral valve disease, unspecified: Secondary | ICD-10-CM | POA: Insufficient documentation

## 2013-09-08 DIAGNOSIS — R51 Headache: Secondary | ICD-10-CM

## 2013-09-08 DIAGNOSIS — I519 Heart disease, unspecified: Secondary | ICD-10-CM

## 2013-09-08 LAB — COMPREHENSIVE METABOLIC PANEL (CC13)
ALT: 19 U/L (ref 0–55)
ANION GAP: 10 meq/L (ref 3–11)
AST: 20 U/L (ref 5–34)
Albumin: 3.3 g/dL — ABNORMAL LOW (ref 3.5–5.0)
Alkaline Phosphatase: 71 U/L (ref 40–150)
BUN: 18.2 mg/dL (ref 7.0–26.0)
CALCIUM: 9.2 mg/dL (ref 8.4–10.4)
CHLORIDE: 105 meq/L (ref 98–109)
CO2: 25 meq/L (ref 22–29)
CREATININE: 0.7 mg/dL (ref 0.6–1.1)
GLUCOSE: 120 mg/dL (ref 70–140)
Potassium: 4.2 mEq/L (ref 3.5–5.1)
Sodium: 139 mEq/L (ref 136–145)
TOTAL PROTEIN: 6.7 g/dL (ref 6.4–8.3)
Total Bilirubin: 0.39 mg/dL (ref 0.20–1.20)

## 2013-09-08 LAB — CBC WITH DIFFERENTIAL/PLATELET
BASO%: 0.4 % (ref 0.0–2.0)
Basophils Absolute: 0 10*3/uL (ref 0.0–0.1)
EOS%: 3.5 % (ref 0.0–7.0)
Eosinophils Absolute: 0.2 10*3/uL (ref 0.0–0.5)
HCT: 35 % (ref 34.8–46.6)
HGB: 12 g/dL (ref 11.6–15.9)
LYMPH%: 20.2 % (ref 14.0–49.7)
MCH: 28.1 pg (ref 25.1–34.0)
MCHC: 34.3 g/dL (ref 31.5–36.0)
MCV: 82 fL (ref 79.5–101.0)
MONO#: 0.5 10*3/uL (ref 0.1–0.9)
MONO%: 8.8 % (ref 0.0–14.0)
NEUT#: 3.4 10*3/uL (ref 1.5–6.5)
NEUT%: 67.1 % (ref 38.4–76.8)
NRBC: 0 % (ref 0–0)
Platelets: 242 10*3/uL (ref 145–400)
RBC: 4.27 10*6/uL (ref 3.70–5.45)
RDW: 13.8 % (ref 11.2–14.5)
WBC: 5.1 10*3/uL (ref 3.9–10.3)
lymph#: 1 10*3/uL (ref 0.9–3.3)

## 2013-09-08 MED ORDER — TRASTUZUMAB CHEMO INJECTION 440 MG
6.0000 mg/kg | Freq: Once | INTRAVENOUS | Status: AC
  Administered 2013-09-08: 399 mg via INTRAVENOUS
  Filled 2013-09-08: qty 19

## 2013-09-08 MED ORDER — LORAZEPAM 0.5 MG PO TABS: 0.5000 mg | ORAL_TABLET | Freq: Three times a day (TID) | ORAL | Status: DC | PRN

## 2013-09-08 MED ORDER — SODIUM CHLORIDE 0.9 % IJ SOLN
10.0000 mL | INTRAMUSCULAR | Status: DC | PRN
  Administered 2013-09-08: 10 mL
  Filled 2013-09-08: qty 10

## 2013-09-08 MED ORDER — DIPHENHYDRAMINE HCL 25 MG PO CAPS
ORAL_CAPSULE | ORAL | Status: AC
  Filled 2013-09-08: qty 2

## 2013-09-08 MED ORDER — HEPARIN SOD (PORK) LOCK FLUSH 100 UNIT/ML IV SOLN
500.0000 [IU] | Freq: Once | INTRAVENOUS | Status: AC | PRN
  Administered 2013-09-08: 500 [IU]
  Filled 2013-09-08: qty 5

## 2013-09-08 MED ORDER — ACETAMINOPHEN 325 MG PO TABS
650.0000 mg | ORAL_TABLET | Freq: Once | ORAL | Status: AC
  Administered 2013-09-08: 650 mg via ORAL

## 2013-09-08 MED ORDER — DIPHENHYDRAMINE HCL 25 MG PO CAPS
50.0000 mg | ORAL_CAPSULE | Freq: Once | ORAL | Status: AC
Start: 2013-09-08 — End: 2013-09-08
  Administered 2013-09-08: 50 mg via ORAL

## 2013-09-08 MED ORDER — SODIUM CHLORIDE 0.9 % IV SOLN
Freq: Once | INTRAVENOUS | Status: AC
  Administered 2013-09-08: 16:00:00 via INTRAVENOUS

## 2013-09-08 MED ORDER — ACETAMINOPHEN 325 MG PO TABS
ORAL_TABLET | ORAL | Status: AC
  Filled 2013-09-08: qty 2

## 2013-09-08 NOTE — Patient Instructions (Signed)
We will contact you in 3 months to schedule your next appointment and echocardiogram  

## 2013-09-08 NOTE — Telephone Encounter (Signed)
Per staff message and POF I have scheduled appts.  [name]  

## 2013-09-08 NOTE — Progress Notes (Signed)
  Echocardiogram 2D Echocardiogram has been performed.  Jasmine Schwartz 09/08/2013, 11:48 AM

## 2013-09-08 NOTE — Progress Notes (Signed)
Patient ID: Jasmine Schwartz, female   DOB: 1965-10-05, 48 y.o.   MRN: [id number] Oncologist: Dr Humphrey Rolls  General Surgeon: Dr Dalbert Batman   HPI:    Jasmine Schwartz is a 48 year old with PMH of DM I, R breast cancer ER/PR negative HER-2 positive invasive ductal carcinoma the Ki-67 was 76% with no known coronary disease. She is referred by Dr. Humphrey Rolls for enrollment into the cardio-oncology clinic.   Finished chemotherapy and had bilateral mastectomies in 10/14. Herceptin started 01/2013 and will continue for 1 year.    ECHOs 01/13/13 EF 60% Lateral S' 11.2 05/27/13 EF 60%, lateral S' 10.7 Global strain - 20.7% 1/15 EF 55-60%, lateral s' 10.4, GLS -21.9%  At last appointment, she noted lower extremity edema and weight gain.  She was started on Lasix with a 14 lb weight loss since last appointment. She is now off Lasix and the lower extremity edema is staying down.  No exertional dyspnea or chest pain.   ROS: All systems negative except as listed in HPI, PMH and Problem List.  Past Medical History  Diagnosis Date  . Anxiety   . Depression   . Dyslipidemia   . Anxiety and depression 07/06/2011  . Hx of insertion of insulin pump ~ 2001 to present (06/09/2013)  . PONV (postoperative nausea and vomiting)   . Venous insufficiency   . Lower extremity edema   . Diabetes mellitus     IDDM  . Type I diabetes mellitus     uses insulin pump  . Breast cancer 01/01/13    /metastatic 1/1 lymph nodes    Current Outpatient Prescriptions  Medication Sig Dispense Refill  . cyanocobalamin 500 MCG tablet Take 500 mcg by mouth daily.      Marland Kitchen glucose blood (NOVA MAX TEST) test strip Use as directed up to 8 times a day      . insulin aspart (NOVOLOG) 100 UNIT/ML injection Inject as directed into Insulin pump, total of 40 units/day  20 mL  11  . Insulin Infusion Pump (MINIMED INSULIN PUMP) DEVI Inject 1 Units/hr into the vein continuous. Pt give extra per cards at meal times. Basic meals are about 5 units      . Insulin Infusion  Pump Supplies (MINIMED INFUSION SET-MMT 397) MISC 1 Device by Does not apply route every 3 (three) days.      . Insulin Infusion Pump Supplies (PARADIGM PUMP RESERVOIR 1.76ML) MISC 1 Device by Does not apply route every 3 (three) days.      Marland Kitchen UNABLE TO FIND Rx: F7494- Mastectomy Form, Bilateral (Quantity: 2) L8000- Mastectomy Bra (Quantity: 2) Dx: 174.9; Bilateral Mastectomy  1 each  0  . UNABLE TO FIND Rx: W9675- Post Mastectomy Garment (Quantity: 2) Dx: 174.9; Bilateral Mastectomy  1 each  0  . LORazepam (ATIVAN) 0.5 MG tablet Take 1 tablet (0.5 mg total) by mouth every 8 (eight) hours as needed for anxiety.  30 tablet  2   No current facility-administered medications for this encounter.    Filed Vitals:   09/08/13 1141  BP: 102/58  Pulse: 94  Resp: 16  Weight: 144 lb 8 oz (65.545 kg)  SpO2: 100%   PHYSICAL EXAM: General:  Well appearing. No resp difficulty HEENT: normal Neck: supple. JVP 6-7. Carotids 2+ bilaterally; no bruits. No lymphadenopathy or thryomegaly appreciated. Cor: PMI normal. Regular rate & rhythm. No rubs, gallops or murmurs. Lungs: clear Abdomen: soft, nontender, nondistended. No hepatosplenomegaly. No bruits or masses. Good bowel sounds. Extremities: no  cyanosis, clubbing, rash.  No edema. Neuro: alert & orientedx3, cranial nerves grossly intact. Moves all 4 extremities w/o difficulty. Affect pleasant.  ASSESSMENT & PLAN:  1) Breast Cancer: I reviewed her echo today.  EF, lateral s' and global longitudinal strain are stable.  She can continue Herceptin and will followup with echo in 3 months.  2) Lower extremity edema: Probably due to venous insufficiency.  The edema has now resolved.  She has stopped Lasix.   Loralie Champagne  09/08/2013

## 2013-09-08 NOTE — Patient Instructions (Signed)

## 2013-09-08 NOTE — Progress Notes (Signed)
OFFICE PROGRESS NOTE  CC  [name], MD [address] [place] [id number] Dr. Thea Silversmith  Dr. 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 At that time she had mammogram performed and it was negative. However her breast continue to increase in size. Initially patient felt that it could have been cysts or something on and therefore she did not go to a physician. However subsequently patient's breast became quite painful and she also began to feel in knot under her right arm.because of this she went to her physician who did a physical exam and a mammogram was performed on 01/01/2013 the mammogram showed diffuse skin thickening generalized increased density within the right breast and enlarged right axillary lymph nodes. She went on to have an ultrasound of the right breast performed that showed a large spiculated hypoechoic mass in the right breast 11:00 2 cm from the nipple measuring at least 4.5 cm in size. There were abnormal lymph nodes with thickened cortex and the right axilla the largest one measuring 3.1 cm in dimension.  #2Patient was recommended ultrasound guided core biopsy of the right breast mass and the right axilla lymph node. The biopsy was performed on 01/01/2013. The right needle core biopsy of the primary mass showed invasive mammary carcinoma grade 2-3 was invasive ductal. The lymph node of the right axilla was positive for metastatic disease. The tumor was ER PR negative HER-2/neu positive with a Ki-67 of 76%. On 01/09/2013 patient had MRI of the breasts performed that showed diffuse right breast skin thickening and edema with right axillary lymph node. There was a 5 mm internal mammary lymph node that  was incompletely imaged. The right breast mass measured 8 x 5 x 4.7 cm. There were abnormal areas involving all 4 quadrants. There was however no involvement of the pectoralis minor for lower chest wall. The left breast showed 2 areas of nodular enhancement these were biopsied and were fibroadenomas  #3 it was recommended that patient begin neoadjuvant chemotherapy consisting of Taxotere carboplatinum Herceptin and perjeta. This would be given every 3 weeks with day 2 Neulasta. A total of 6 cycles of therapy were planned. Once she completes the treatment then she would proceed with MRI of the breasts again for evaluation a response to therapy and eventually a mastectomy.  #4 patient is status post Neoadjuvant with curative intent Taxotere carboplatinum Herceptin and perjeta starting 01/16/2013 - 05/01/2013.  #5 patient is status post bilateral mastectomies with the left breast revealing no evidence of cancer. Right breast reveals no residual carcinoma 14 right axillary lymph nodes were negative for metastatic disease.  #6 patient began adjuvant Herceptin starting 06/16/2013. Patient will complete out 1 year of adjuvant Herceptin.  #7  Patient is currently receiving radiation therapy at [facility].     CURRENT THERAPY:  Adjuvant herceptin beginning 06/16/13  INTENT: Curative  INTERVAL HISTORY: Jasmine Schwartz 48 y.o. female returns for followup visit today prior to her adjuvant every three week Herceptin.  She is c/o headaches today.  These headaches started about two weeks ago.  This headache is described as a dull constant headache locate in her eyes and sinuses.  She has had nasal drainage that started with chemotherapy.  She  denies sinus tenderness, fevers, sinus pressure, vision changes, photophobia, nausea, vomiting, dizziness, weakness, slurred speech, or any other concerns.  She continues to tolerate the Herceptin without difficulty.  She denies chest pain, palpitations, DOE,  orthopnea, weight gain, swelling, or any other concerns.   MEDICAL HISTORY: Past Medical History  Diagnosis Date  . Anxiety   . Depression   . Dyslipidemia   . Anxiety and depression 07/06/2011  . Hx of insertion of insulin pump ~ 2001 to present (06/09/2013)  . PONV (postoperative nausea and vomiting)   . Venous insufficiency   . Lower extremity edema   . Diabetes mellitus     IDDM  . Type I diabetes mellitus     uses insulin pump  . Breast cancer 01/01/13    /metastatic 1/1 lymph nodes    ALLERGIES:  has No Known Allergies.  MEDICATIONS:  Current Outpatient Prescriptions  Medication Sig Dispense Refill  . cyanocobalamin 500 MCG tablet Take 500 mcg by mouth daily.      Marland Kitchen glucose blood (NOVA MAX TEST) test strip Use as directed up to 8 times a day      . insulin aspart (NOVOLOG) 100 UNIT/ML injection Inject as directed into Insulin pump, total of 40 units/day  20 mL  11  . Insulin Infusion Pump (MINIMED INSULIN PUMP) DEVI Inject 1 Units/hr into the vein continuous. Pt give extra per cards at meal times. Basic meals are about 5 units      . Insulin Infusion Pump Supplies (MINIMED INFUSION SET-MMT 397) MISC 1 Device by Does not apply route every 3 (three) days.      . Insulin Infusion Pump Supplies (PARADIGM PUMP RESERVOIR 1.76ML) MISC 1 Device by Does not apply route every 3 (three) days.      Marland Kitchen LORazepam (ATIVAN) 0.5 MG tablet Take 1 tablet (0.5 mg total) by mouth every 8 (eight) hours as needed for anxiety.  30 tablet  2  . UNABLE TO FIND Rx: P5361- Mastectomy Form, Bilateral (Quantity: 2) L8000- Mastectomy Bra (Quantity: 2) Dx: 174.9; Bilateral Mastectomy  1 each  0  . UNABLE TO FIND Rx: [id number]- Post Mastectomy Garment (Quantity: 2) Dx: 174.9; Bilateral Mastectomy  1 each  0   No current facility-administered medications for this visit.    SURGICAL HISTORY:  Past Surgical History  Procedure Laterality Date  . Lumbar disc surgery  2009    Dr Maxie Better  . Cesarean section   1989  . Tubal ligation  1991  . Breast biopsy Right 01/01/13    Invasive mammary ca,metastatic in 1/1 lymph node  . Breast biopsy Left 01/09/13    Fibroadenoma,microcalcifications  . Portacath placement Left 01/2013  . Mastectomy modified radical Right 06/09/2013  . Mastectomy complete / simple Left 06/09/2013  . Mastectomy modified radical Right 06/09/2013    Procedure: RIGHT MASTECTOMY MODIFIED RADICAL;  Surgeon: Adin Hector, MD;  Location: [place];  Service: General;  Laterality: Right;  . Simple mastectomy with axillary sentinel node biopsy Left 06/09/2013    Procedure: LEFT SIMPLE MASTECTOMY;  Surgeon: Adin Hector, MD;  Location: [place];  Service: General;  Laterality: Left;    REVIEW OF SYSTEMS:  A 10 point review of systems was conducted and is otherwise negative except for what is noted above.    PHYSICAL EXAMINATION: Blood pressure 125/82, pulse 109, temperature 97.7 F (36.5 C), temperature source Oral, resp. rate 19, height 5' 3"  (1.6 m), weight 143 lb 9.6 oz (65.137 kg). Body mass index  is 25.44 kg/(m^2). GENERAL: Patient is a well appearing female in no acute distress HEENT:  Sclerae anicteric.  Oropharynx clear and moist. No ulcerations or evidence of oropharyngeal candidiasis. Neck is supple.  NODES:  No cervical, supraclavicular, or axillary lymphadenopathy palpated.  BREAST EXAM:  Right chest wall with erythema and desquamation from radiation therapy. LUNGS:  Clear to auscultation bilaterally.  No wheezes or rhonchi. HEART:  Regular rate and rhythm. No murmur appreciated. ABDOMEN:  Soft, nontender.  Positive, normoactive bowel sounds. No organomegaly palpated. MSK:  No focal spinal tenderness to palpation. Full range of motion bilaterally in the upper extremities. EXTREMITIES:  No peripheral edema.   SKIN:  Clear with no obvious rashes or skin changes. No nail dyscrasia. NEURO:  Nonfocal. Well oriented.  Appropriate affect. ECOG PERFORMANCE STATUS: 0 -  Asymptomatic  LABORATORY DATA: Lab Results  Component Value Date   WBC 5.1 09/08/2013   HGB 12.0 09/08/2013   HCT 35.0 09/08/2013   MCV 82.0 09/08/2013   PLT 242 09/08/2013      Chemistry      Component Value Date/Time   NA 139 08/18/2013 1501   NA 137 07/28/2013 0946   K 4.3 08/18/2013 1501   K 4.4 07/28/2013 0946   CL 101 08/18/2013 1501   CL 99 01/23/2013 1251   CO2 24 08/18/2013 1501   CO2 27 07/28/2013 0946   BUN 19 08/18/2013 1501   BUN 17.7 07/28/2013 0946   CREATININE 0.68 08/18/2013 1501   CREATININE 0.7 07/28/2013 0946      Component Value Date/Time   CALCIUM 9.3 08/18/2013 1501   CALCIUM 9.6 07/28/2013 0946   ALKPHOS 71 08/18/2013 1501   ALKPHOS 65 07/28/2013 0946   AST 22 08/18/2013 1501   AST 21 07/28/2013 0946   ALT 21 08/18/2013 1501   ALT 17 07/28/2013 0946   BILITOT 0.3 08/18/2013 1501   BILITOT 0.40 07/28/2013 0946     Pathology 06/09/2013:  Diagnosis 1. Breast, simple mastectomy, Left - FIBROCYSTIC CHANGE WITH ADENOSIS AND CALCIFICATIONS. - NO MALIGNANCY IDENTIFIED. 2. Breast, modified radical mastectomy , Right - BENIGN BREAST TISSUE WITH EXTENSIVE FIBROSIS CONSISTENT WITH TREATMENT EFFECT. - NO RESIDUAL CARCINOMA IDENTIFIED. - FOURTEEN LYMPH NODES NEGATIVE FOR CARCINOMA WITH FOCAL HYLANIZATION (0/14). - SEE ONCOLOGY TABLE AND COMMENT. Microscopic Comment 2. BREAST, INVASIVE TUMOR, WITH LYMPH NODE SAMPLING Specimen, including laterality and lymph node sampling (sentinel, non-sentinel): Right breast and axillary contents. Procedure: Right modified radical mastectomy. Histologic type, tumor grade, tumor size, margins, lympovascular invasion, tumor focality, and extent of tumor can not be assessed, as there is no residual carcinoma. Ductal carcinoma in situ: Absent. Lobular neoplasia: Absent. Treatment effect: Present, extensive. If present, treatment effect in breast tissue, lymph nodes or both: Both. Lymph nodes: Examined: 14 Total Lymph nodes with metastasis:  0 Isolated tumor cells (< 0.2 mm): 0 Micrometastasis: (> 0.2 mm and < 2.0 mm): 0 Macrometastasis: (> 2.0 mm): 0 Extracapsular extension: 0 Breast prognostic profile: [id number] Estrogen receptor: Negative. Progesterone receptor: Negative. Her 2 neu: Amplified. Ki-67: 76% Non-neoplastic breast: Fibrocystic change. 1 of 3 FINAL for RONIESHA, [place] [phone]) Microscopic Comment(continued) TNM: ypT0, ypN0 Comments: There is no residual tumor in the current breast specimen, status post neoadjuvant chemotherapy, and therefore it is best staged as ypT0. Many of the nodes exhibit fibrosis consistent with treatment effect. Immunohistochemistry for pancytokeratin was performed on the two largest lymph nodes and was negative for carcinoma, resulting in a stage of ypN0. Vicente Males MD Pathologist, Electronic Signature (  Case signed 06/11/2013) Specimen Gross and Clinical  RADIOGRAPHIC STUDIES:  Ct Chest W Contrast  01/20/2013   *RADIOLOGY REPORT*  Clinical Data:  High risk breast cancer.  Evaluate for metastatic disease.  CT CHEST, ABDOMEN AND PELVIS WITH CONTRAST  Technique:  Multidetector CT imaging of the chest, abdomen and pelvis was performed following the standard protocol during bolus administration of intravenous contrast.  Contrast: 129m OMNIPAQUE IOHEXOL 300 MG/ML  SOLN  Comparison:  None  CT CHEST  Findings:  Lungs/pleura: There is no pleural effusion.  No suspicious pulmonary nodule or mass noted.  Heart/Mediastinum: Heart size is normal.  No pericardial effusion. No mediastinal or hilar adenopathy.  Bones/Musculoskeletal:  Asymmetric increased soft tissue within the right breast parenchyma corresponding to patient's biopsy-proven invasive breast carcinoma.  There is associated overlying skin thickening.  Multiple enlarged right axillary lymph nodes are identified.  Index lymph node measures 1.7 cm, image 23/series 2. Several enhancing, sub centimeter retropectoral lymph nodes are  identified.  IMPRESSION:  1.  No acute findings. 2.  Right breast invasive carcinoma with associated right axillary adenopathy.  CT ABDOMEN AND PELVIS  Findings:  There are no focal liver abnormalities identified.  The gallbladder appears normal.  There is no biliary dilatation.  The pancreas is unremarkable.  Normal appearance of the spleen.  The adrenal glands are both normal.  The kidneys are unremarkable. Urinary bladder appears normal.  The uterus is unremarkable. Corpus luteal cyst is noted in the left ovary measuring 1.6 cm.  No free fluid or fluid collections identified within the upper abdomen or pelvis.  The abdominal aorta has a normal caliber. There is no adenopathy identified within the upper abdomen.  No pelvic or inguinal adenopathy noted.  The stomach is normal.  The small bowel loops are unremarkable. Normal appearance of the colon.  Review of the visualized osseous structures is negative for aggressive lytic or sclerotic bone lesion.  IMPRESSION:  1.  No acute findings within the abdomen or pelvis. 2.  No mass or adenopathy.   Original Report Authenticated By: TKerby Moors M.D.   [place]  01/09/2013   *RADIOLOGY REPORT*  Clinical Data:  48year old female with newly diagnosed right breast cancer.  Left mammogram to compare to presurgical MRI.  DIGITAL DIAGNOSTIC LEFT MAMMOGRAM WITH CAD AND LEFT BREAST ULTRASOUND:  Comparison:  06/11/2012 mammogram and 01/09/2013 MRI  Findings:  ACR Breast Density Category 3: The breast tissue is heterogeneously dense.  A possible circumscribed mass in the upper outer left breast is identified. No suspicious calcifications or distortion identified.  Mammographic images were processed with CAD.  On physical exam, no palpable abnormalities identified within the upper left breast.  Ultrasound is performed, showing a 6 x 6 x 8 mm slightly irregular hypoechoic mass at the 11 o'clock position of the left breast 5 cm from the nipple An 11 x 7 x 9 mm slightly  irregular hypoechoic mass at the 2 o'clock position of the left breast 4 cm from the nipple is also present. No enlarged or abnormal appearing left axillary lymph nodes are identified.  IMPRESSION: Indeterminate 6 x 6 x 8 mm hypoechoic mass at the 11 o'clock position of the left breast and 11 x 7 x 9 mm hypoechoic mass in the 2 o'clock position of the left breast.  The 11 o'clock position mass probably represents the enhancing nodule identified on today's MRI.  Tissue sampling of both masses is recommended.  BI-RADS CATEGORY 4:  Suspicious abnormality - biopsy  should be considered.  RECOMMENDATION: Ultrasound guided biopsies of both left breast masses.  The findings and recommendations were discussed with the patient and she desires to proceed with ultrasound guided left breast biopsies. These biopsies will be performed today but dictated in a separate report.  Results were also provided in writing at the conclusion of the visit.  If applicable, a reminder letter will be sent to the patient regarding the next appointment.   Original Report Authenticated By: Margarette Canada, M.D.   Ct Abdomen Pelvis W Contrast  01/22/2013   *RADIOLOGY REPORT*  Clinical Data:  High risk breast cancer.  Evaluate for metastatic disease.  CT CHEST, ABDOMEN AND PELVIS WITH CONTRAST  Technique:  Multidetector CT imaging of the chest, abdomen and pelvis was performed following the standard protocol during bolus administration of intravenous contrast.  Contrast: 113m OMNIPAQUE IOHEXOL 300 MG/ML  SOLN  Comparison:  None  CT CHEST  Findings:  Lungs/pleura: There is no pleural effusion.  No suspicious pulmonary nodule or mass noted.  Heart/Mediastinum: Heart size is normal.  No pericardial effusion. No mediastinal or hilar adenopathy.  Bones/Musculoskeletal:  Asymmetric increased soft tissue within the right breast parenchyma corresponding to patient's biopsy-proven invasive breast carcinoma.  There is associated overlying skin thickening.   Multiple enlarged right axillary lymph nodes are identified.  Index lymph node measures 1.7 cm, image 23/series 2. Several enhancing, sub centimeter retropectoral lymph nodes are identified.  IMPRESSION:  1.  No acute findings. 2.  Right breast invasive carcinoma with associated right axillary adenopathy.  CT ABDOMEN AND PELVIS  Findings:  There are no focal liver abnormalities identified.  The gallbladder appears normal.  There is no biliary dilatation.  The pancreas is unremarkable.  Normal appearance of the spleen.  The adrenal glands are both normal.  The kidneys are unremarkable. Urinary bladder appears normal.  The uterus is unremarkable. Corpus luteal cyst is noted in the left ovary measuring 1.6 cm.  No free fluid or fluid collections identified within the upper abdomen or pelvis.  The abdominal aorta has a normal caliber. There is no adenopathy identified within the upper abdomen.  No pelvic or inguinal adenopathy noted.  The stomach is normal.  The small bowel loops are unremarkable. Normal appearance of the colon.  Review of the visualized osseous structures is negative for aggressive lytic or sclerotic bone lesion.  IMPRESSION:  1.  No acute findings within the abdomen or pelvis. 2.  No mass or adenopathy.   Original Report Authenticated By: TKerby Moors M.D.   Mr Breast Bilateral W Wo Contrast  01/09/2013   *RADIOLOGY REPORT*  Clinical Data: Biopsy-proven right breast inflammatory carcinoma with primary invasive mammary carcinoma and previous biopsy and known right axillary metastatic disease.  Preoperative staging MRI.  [place] [facility] [place] [place] [facility] [address] Results:  BUN 10 mg/dL,  Creatinine 0.9 mg/dL.  BILATERAL BREAST MRI WITH AND WITHOUT CONTRAST  Technique: Multiplanar, multisequence MR images of both breasts were obtained prior to and following the intravenous administration of 172mof Multihance.  Three dimensional images were evaluated at  the independent DynaCad workstation.  Comparison:  Prior mammograms and ultrasound.  The patient underwent diagnostic mammography and ultrasound of the left breast today as no recent prior exam was available after the recent diagnosis in the right breast.  Findings: There is diffuse right breast skin thickening and edema, trabecular edema, and right axillary lymphadenopathy with maximal nodal measurement 3.1 cm short-axis diameter.  There is also a 5 mm internal mammary artery chain lymph node identified image 26 series 2. There is an incompletely visualized lymph node subjacent to the pectoralis minor muscle on the first image of the T2-weighted series.  There is a large irregular enhancing mass in the right breast 11 o'clock location with associated clip artifact measuring maximally 8.0 x 5.6 by 4.7 cm. This demonstrates washin/washout type enhancement kinetics.  This corresponds to the biopsy-proven invasive mammary carcinoma.  There are abnormal nodular areas of trabecular has been throughout the right breast involving all four quadrants. No abnormal underlying pectoralis enhancement or chest wall involvement is identified.  In the left breast 11 o'clock location, there is an 8 mm enhancing nodule corresponding to that seen at the examination earlier today. This area demonstrates plateau type enhancement kinetics.  No left- sided axillary or internal mammary artery chain lymphadenopathy is identified.  There is mild inhomogeneous nodular enhancement in the left breast two o'clock location at the site of the mass seen in this location earlier today, measuring 9 mm, measuring progressive enhancement kinetics.  IMPRESSION: Large irregular enhancing right breast mass 11 o'clock location corresponding to biopsy-proven invasive mammary carcinoma, with metastatic right axillary lymphadenopathy as well as skin and trabecular thickening/enhancement compatible with the clinical diagnosis of inflammatory and/or locally  advanced breast cancer.  5 mm abnormal right internal mammary artery chain lymph node.  8 mm enhancing mass left breast 11 o'clock location and mildly irregular area of mass-like enhancement in the left breast two o'clock location, corresponding to those seen at diagnostic examination earlier today.  Biopsy has been scheduled for today and will be dictated separately.  RECOMMENDATION: Treatment plan  THREE-DIMENSIONAL MR IMAGE RENDERING ON INDEPENDENT WORKSTATION:  Three-dimensional MR images were rendered by post-processing of the original MR data on an independent workstation.  The three- dimensional MR images were interpreted, and findings were reported in the accompanying complete MRI report for this study.  BI-RADS CATEGORY 6:  Known biopsy-proven malignancy - appropriate action should be taken.   Original Report Authenticated By: Conchita Paris, M.D.   Nm Pet Image Initial (pi) Skull Base To Thigh  01/20/2013   *RADIOLOGY REPORT*  Clinical Data: Initial treatment strategy for breast cancer.  NUCLEAR MEDICINE PET SKULL BASE TO THIGH  Fasting Blood Glucose:  263  Technique:  19.1 mCi F-18 FDG was injected intravenously. CT data was obtained and used for attenuation correction and anatomic localization only.  (This was not acquired as a diagnostic CT examination.) Additional exam technical data entered on technologist worksheet.  Comparison:  None  Findings:  Neck: No hypermetabolic lymph nodes in the neck.  Chest:  No hypermetabolic mediastinal or hilar nodes.  No suspicious pulmonary nodules on the CT scan. Enlarged right axillary lymph nodes identified.  Index lymph node measures 1.7 cm and has an SUV max equal to 2.8, image 77.  There is asymmetric increased uptake throughout the right breast with associated overlying skin thickening.  This has an SUV max equal to 4.4, image 101.  Abdomen/Pelvis:  No abnormal hypermetabolic activity within the liver, pancreas, adrenal glands, or spleen.  No hypermetabolic  lymph nodes in the abdomen or pelvis. There is a small focus of increased FDG uptake within the left ovary.  This has an SUV max equal to 13.1, image 217.  Skeleton:  No focal hypermetabolic activity to suggest skeletal metastasis.  IMPRESSION:  1.  Asymmetric increased uptake associated with the right breast and right breast skin thickening corresponding  to biopsy-proven invasive breast cancer. 2.  Enlarged and hypermetabolic right axillary lymph nodes consistent with metastatic adenopathy. 3.  No evidence for distant metastatic disease. 4.  Focal area of increased uptake within the left axilla.  The patient is noted to have a corpus luteal cyst are not diagnostic CT images.  This activity is favored to represent physiologic activity.  Consider confirmatory imaging with pelvic sonogram.   Original Report Authenticated By: Kerby Moors, M.D.   Ir Fluoro Guide Cv Line Left  01/15/2013   *RADIOLOGY REPORT*  Clinical Data/Indication: Breast cancer  TUNNEL POWER PORT PLACEMENT WITH SUBCUTANEOUS POCKET UTILIZING ULTRASOUND & FLOUROSCOPY  Sedation: Versed 5.0 mg, Fentanyl 250 mcg.  Total Moderate Sedation Time: 35 minutes.  As antibiotic prophylaxis, Ancef  was ordered pre-procedure and administered intravenously within one hour of incision.  Fluoroscopy Time: 36 seconds.  Procedure:  After written informed consent was obtained, patient was placed in the supine position on angiographic table. The left neck and chest was prepped and draped in a sterile fashion. Lidocaine was utilized for local anesthesia.  The left internal jugular vein was noted to be patent initially with ultrasound. Under sonographic guidance, a micropuncture needle was inserted into the left IJ vein (Ultrasound and fluoroscopic image documentation was performed). The needle was removed over an 018 wire which was exchanged for a Amplatz.  This was advanced into the IVC.  An 8-French dilator was advanced over the Amplatz.  A small incision was made in  the left upper chest over the anterior left second rib.  Utilizing blunt dissection, a subcutaneous pocket was created in the caudal direction. The pocket was irrigated with a copious amount of sterile normal saline.  The port catheter was tunneled from the chest incision, and out the neck incision.  The reservoir was inserted into the subcutaneous pocket and secured with two 3-0 Ethilon stitches.  A peel-away sheath was advanced over the Amplatz wire.  The port catheter was cut to measure length and inserted through the peel-away sheath.  The peel-away sheath was removed.  The chest incision was closed with 3-0 Vicryl interrupted stitches for the subcutaneous tissue and a running of 4- 0 Vicryl subcuticular stitch for the skin.  The neck incision was closed with a 4-0 Vicryl subcuticular stitch.  Derma-bond was applied to both surgical incisions.  The port reservoir was flushed and instilled with heparinized saline.  No complications.  Findings:  A left IJ vein Port-A-Cath is in place with its tip at the cavoatrial junction.  IMPRESSION: Successful 8 French left internal jugular vein power port placement with its tip at the SVC/RA junction.   Original Report Authenticated By: Marybelle Killings, M.D.   Ir US Guide Vasc Access Left  01/15/2013   *RADIOLOGY REPORT*  Clinical Data/Indication: Breast cancer  TUNNEL POWER PORT PLACEMENT WITH SUBCUTANEOUS POCKET UTILIZING ULTRASOUND & FLOUROSCOPY  Sedation: Versed 5.0 mg, Fentanyl 250 mcg.  Total Moderate Sedation Time: 35 minutes.  As antibiotic prophylaxis, Ancef  was ordered pre-procedure and administered intravenously within one hour of incision.  Fluoroscopy Time: 36 seconds.  Procedure:  After written informed consent was obtained, patient was placed in the supine position on angiographic table. The left neck and chest was prepped and draped in a sterile fashion. Lidocaine was utilized for local anesthesia.  The left internal jugular vein was noted to be patent  initially with ultrasound. Under sonographic guidance, a micropuncture needle was inserted into the left IJ vein (Ultrasound and fluoroscopic image documentation was  performed). The needle was removed over an 018 wire which was exchanged for a Amplatz.  This was advanced into the IVC.  An 8-French dilator was advanced over the Amplatz.  A small incision was made in the left upper chest over the anterior left second rib.  Utilizing blunt dissection, a subcutaneous pocket was created in the caudal direction. The pocket was irrigated with a copious amount of sterile normal saline.  The port catheter was tunneled from the chest incision, and out the neck incision.  The reservoir was inserted into the subcutaneous pocket and secured with two 3-0 Ethilon stitches.  A peel-away sheath was advanced over the Amplatz wire.  The port catheter was cut to measure length and inserted through the peel-away sheath.  The peel-away sheath was removed.  The chest incision was closed with 3-0 Vicryl interrupted stitches for the subcutaneous tissue and a running of 4- 0 Vicryl subcuticular stitch for the skin.  The neck incision was closed with a 4-0 Vicryl subcuticular stitch.  Derma-bond was applied to both surgical incisions.  The port reservoir was flushed and instilled with heparinized saline.  No complications.  Findings:  A left IJ vein Port-A-Cath is in place with its tip at the cavoatrial junction.  IMPRESSION: Successful 8 French left internal jugular vein power port placement with its tip at the SVC/RA junction.   Original Report Authenticated By: Marybelle Killings, M.D.   Mm Digital Diagnostic Unilat L  01/09/2013   *RADIOLOGY REPORT*  Clinical Data:  Evaluate clip placement following two separate ultrasound-guided left breast biopsies.  DIGITAL DIAGNOSTIC LEFT MAMMOGRAM  Comparison:  Previous exams  Findings:  Films are performed following two separate ultrasound guided biopsies of the left breast - at the 11 o'clock position  and 2 o'clock position. The ribbon shaped biopsy clip corresponds to the 11 o'clock mass and the T shaped biopsy clip corresponds to the 2 o'clock mass. No immediate complications are identified.  IMPRESSION: Satisfactory clip placement following ultrasound guided left breast biopsies.  Pathology will be followed.   Original Report Authenticated By: Margarette Canada, M.D.   Mm Digital Diagnostic Unilat L  01/09/2013   *RADIOLOGY REPORT*  Clinical Data:  48 year old female with newly diagnosed right breast cancer.  Left mammogram to compare to presurgical MRI.  DIGITAL DIAGNOSTIC LEFT MAMMOGRAM WITH CAD AND LEFT BREAST ULTRASOUND:  Comparison:  06/11/2012 mammogram and 01/09/2013 MRI  Findings:  ACR Breast Density Category 3: The breast tissue is heterogeneously dense.  A possible circumscribed mass in the upper outer left breast is identified. No suspicious calcifications or distortion identified.  Mammographic images were processed with CAD.  On physical exam, no palpable abnormalities identified within the upper left breast.  Ultrasound is performed, showing a 6 x 6 x 8 mm slightly irregular hypoechoic mass at the 11 o'clock position of the left breast 5 cm from the nipple An 11 x 7 x 9 mm slightly irregular hypoechoic mass at the 2 o'clock position of the left breast 4 cm from the nipple is also present. No enlarged or abnormal appearing left axillary lymph nodes are identified.  IMPRESSION: Indeterminate 6 x 6 x 8 mm hypoechoic mass at the 11 o'clock position of the left breast and 11 x 7 x 9 mm hypoechoic mass in the 2 o'clock position of the left breast.  The 11 o'clock position mass probably represents the enhancing nodule identified on today's MRI.  Tissue sampling of both masses is recommended.  BI-RADS CATEGORY 4:  Suspicious  abnormality - biopsy should be considered.  RECOMMENDATION: Ultrasound guided biopsies of both left breast masses.  The findings and recommendations were discussed with the patient and  she desires to proceed with ultrasound guided left breast biopsies. These biopsies will be performed today but dictated in a separate report.  Results were also provided in writing at the conclusion of the visit.  If applicable, a reminder letter will be sent to the patient regarding the next appointment.   Original Report Authenticated By: Margarette Canada, M.D.   [place] Lt Breast Bx W Loc Dev 1st Lesion Img Bx Spec US Guide  01/12/2013   *RADIOLOGY REPORT*  Clinical Data:  49 year old female with newly diagnosed right breast cancer.  Two left breast masses identified sonographically - 6 x 8 mm at the 11 o'clock position and 9 x 11 mm at the 2 o'clock position. For tissue sampling of the 6 x 8 mm mass at the 11 o'clock position.  ULTRASOUND GUIDED VACUUM ASSISTED CORE BIOPSY OF THE LEFT BREAST  Comparison: Previous exams.  I met with the patient and we discussed the procedure of ultrasound- guided biopsy, including benefits and alternatives.  We discussed the high likelihood of a successful procedure. We discussed the risks of the procedure including infection, bleeding, tissue injury, clip migration, and inadequate sampling.  Informed written consent was given.  Using sterile technique and 2% Lidocaine as local anesthetic, under direct ultrasound visualization, a12 gauge vacuum-assisteddevice was used to perform biopsy of the 6 x 8 mm hypoechoic mass at the 11 o'clock position of the left breast 5 cm from the nipple using a medial approach. At the conclusion of the procedure, a ribbon shaped tissue marker clip was deployed into the biopsy cavity. Follow-up 2-view mammogram was performed and dictated separately.  The usual time-out protocol was performed immediately prior to the procedure.  IMPRESSION: Ultrasound-guided biopsy of 6 x 8 mm hypoechoic mass at the 11 o'clock position of the left breast.  No apparent complications.  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  Histology correlates with imaging findings.  The patient was contacted by phone on 01/12/2013 and these results given to her which she understood. Her questions were answered. The patient had no complaints with her biopsy site.  Recommend treatment plan.   Original Report Authenticated By: Margarette Canada, M.D.   [place] Lt Breast Bx W Loc Dev Ea Add Lesion Img Bx Spec US Guide  01/12/2013   *RADIOLOGY REPORT*  Clinical Data:  48 year old female with newly diagnosed right breast cancer.  Two left breast masses identified sonographically - 6 x 8 mm at the 11 o'clock position and 9 x 11 mm at the 2 o'clock position. For tissue sampling of the 9 x 11 mm mass at the 2 o'clock position.  ULTRASOUND GUIDED VACUUM ASSISTED CORE BIOPSY OF THE LEFT BREAST  Comparison: Previous exams.  I met with the patient and we discussed the procedure of ultrasound- guided biopsy, including benefits and alternatives.  We discussed the high likelihood of a successful procedure. We discussed the risks of the procedure including infection, bleeding, tissue injury, clip migration, and inadequate sampling.  Informed written consent was given.  Using sterile technique and 2% Lidocaine as local anesthetic, under direct ultrasound visualization, a 12 gauge vacuum-assisteddevice was used to perform biopsy of the 9 x 11 mm hypoechoic mass at the 2 o'clock position of the left breast 4 cm from the nipple using a medial approach.  At the conclusion of the procedure, a  tissue marker clip was deployed into the biopsy cavity.  Follow-up 2-view mammogram was performed and dictated separately.  The usual time-out protocol was performed immediately prior to the procedure.  IMPRESSION: Ultrasound-guided biopsy of 9 x 11 mm mass in the 2 o'clock position of the left breast.  No apparent complications.  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 Histology correlates with imaging findings.  The  patient was contacted by phone on 01/12/2013 and these results given to her which she understood. Her questions were answered. The patient had no complaints with her biopsy site.  Recommend treatment plan.   Original Report Authenticated By: Margarette Canada, M.D.    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 (T3 N1) Patient was administered adjuvant chemotherapy consisting of Taxotere carboplatinum Herceptin and perjeta x6 cycles from 01/16/2013 through 05/01/2013. Overall she tolerated it well.  #2 patient is now status post bilateral mastectomies on 06/09/2013 with the final pathology revealing no evidence of residual carcinoma in the right breast 14 lymph nodes were negative for metastatic disease. With the final pathologic staging at T0 N0 with complete pathologic response to chemotherapy.  #3 patient to proceed with adjuvant Herceptin every 3 weeks to complete one year. She is followed by cardiology as well for ongoing echocardiograms and evaluation for Herceptin induced cardio toxicity.  Her last appointment was on 09/08/13 and she was recommended to proceed with herceptin with 3 month follow up.    #4 patient will not receive antiestrogen therapy since her tumor is estrogen receptor and progesterone receptor negative  PLAN:   #1  Patient is doing well today.  Her CBC is stable.  She will proceed with herceptin therapy.  She will continue to receive radiation therapy at [place] [facility].  We have requested their records.    #2  She is very concerned and anxious about her headaches.  The headaches and possibility of sinus issues, medication causes, and possibly breast cancer metastases were discussed in detail.  We will get a MRI of her brain to evaluate and r/o progression of her breast cancer.   #3 She will return in 3 weeks for labs, evaluation and  herceptin therapy.    All questions were answered. The patient knows to call the clinic with any problems, questions or concerns. We can certainly see the patient much sooner if necessary.  I spent 25 minutes counseling the patient face to face. The total time spent in the appointment was 30 minutes.  Minette Headland, [place] [phone]

## 2013-09-09 ENCOUNTER — Encounter (HOSPITAL_COMMUNITY): Payer: BC Managed Care – PPO

## 2013-09-17 ENCOUNTER — Ambulatory Visit
Admission: RE | Admit: 2013-09-17 | Discharge: 2013-09-17 | Disposition: A | Discharge status: Home / Self Care | Payer: BC Managed Care – PPO | Source: Ambulatory Visit | Attending: Adult Health | Admitting: Adult Health

## 2013-09-17 DIAGNOSIS — R51 Headache: Secondary | ICD-10-CM

## 2013-09-17 DIAGNOSIS — C50911 Malignant neoplasm of unspecified site of right female breast: Secondary | ICD-10-CM

## 2013-09-17 DIAGNOSIS — R519 Headache, unspecified: Secondary | ICD-10-CM

## 2013-09-17 MED ORDER — GADOBENATE DIMEGLUMINE 529 MG/ML IV SOLN
13.0000 mL | Freq: Once | INTRAVENOUS | Status: AC | PRN
  Administered 2013-09-17: 13 mL via INTRAVENOUS

## 2013-09-29 ENCOUNTER — Other Ambulatory Visit: Payer: Self-pay | Admitting: *Deleted

## 2013-09-29 ENCOUNTER — Ambulatory Visit (HOSPITAL_BASED_OUTPATIENT_CLINIC_OR_DEPARTMENT_OTHER): Payer: BC Managed Care – PPO

## 2013-09-29 ENCOUNTER — Ambulatory Visit (HOSPITAL_BASED_OUTPATIENT_CLINIC_OR_DEPARTMENT_OTHER): Payer: BC Managed Care – PPO | Admitting: Adult Health

## 2013-09-29 ENCOUNTER — Other Ambulatory Visit (HOSPITAL_BASED_OUTPATIENT_CLINIC_OR_DEPARTMENT_OTHER): Payer: BC Managed Care – PPO

## 2013-09-29 ENCOUNTER — Encounter: Payer: Self-pay | Admitting: Adult Health

## 2013-09-29 VITALS — BP 139/67 | HR 99 | Temp 97.9°F | Resp 18 | Ht 63.0 in | Wt 149.8 lb

## 2013-09-29 DIAGNOSIS — C50911 Malignant neoplasm of unspecified site of right female breast: Secondary | ICD-10-CM

## 2013-09-29 DIAGNOSIS — C50419 Malignant neoplasm of upper-outer quadrant of unspecified female breast: Secondary | ICD-10-CM

## 2013-09-29 DIAGNOSIS — C50919 Malignant neoplasm of unspecified site of unspecified female breast: Secondary | ICD-10-CM

## 2013-09-29 DIAGNOSIS — C773 Secondary and unspecified malignant neoplasm of axilla and upper limb lymph nodes: Secondary | ICD-10-CM

## 2013-09-29 DIAGNOSIS — Z5112 Encounter for antineoplastic immunotherapy: Secondary | ICD-10-CM

## 2013-09-29 DIAGNOSIS — Z171 Estrogen receptor negative status [ER-]: Secondary | ICD-10-CM

## 2013-09-29 LAB — COMPREHENSIVE METABOLIC PANEL
ALT: 44 U/L — ABNORMAL HIGH (ref 0–35)
AST: 40 U/L — ABNORMAL HIGH (ref 0–37)
Albumin: 3.4 g/dL — ABNORMAL LOW (ref 3.5–5.2)
Alkaline Phosphatase: 67 U/L (ref 39–117)
BUN: 17 mg/dL (ref 6–23)
CO2: 25 meq/L (ref 19–32)
Calcium: 8.7 mg/dL (ref 8.4–10.5)
Chloride: 102 mEq/L (ref 96–112)
Creatinine, Ser: 0.62 mg/dL (ref 0.50–1.10)
GLUCOSE: 227 mg/dL — AB (ref 70–99)
Potassium: 3.8 mEq/L (ref 3.5–5.3)
Sodium: 136 mEq/L (ref 135–145)
Total Bilirubin: 0.4 mg/dL (ref 0.2–1.2)
Total Protein: 6 g/dL (ref 6.0–8.3)

## 2013-09-29 LAB — CBC WITH DIFFERENTIAL/PLATELET
BASO%: 0.5 % (ref 0.0–2.0)
BASOS ABS: 0 10*3/uL (ref 0.0–0.1)
EOS%: 5 % (ref 0.0–7.0)
Eosinophils Absolute: 0.3 10*3/uL (ref 0.0–0.5)
HCT: 34.9 % (ref 34.8–46.6)
HEMOGLOBIN: 11.9 g/dL (ref 11.6–15.9)
LYMPH%: 18.2 % (ref 14.0–49.7)
MCH: 28.1 pg (ref 25.1–34.0)
MCHC: 34.1 g/dL (ref 31.5–36.0)
MCV: 82.5 fL (ref 79.5–101.0)
MONO#: 0.5 10*3/uL (ref 0.1–0.9)
MONO%: 8.2 % (ref 0.0–14.0)
NEUT#: 3.8 10*3/uL (ref 1.5–6.5)
NEUT%: 68.1 % (ref 38.4–76.8)
PLATELETS: 257 10*3/uL (ref 145–400)
RBC: 4.23 10*6/uL (ref 3.70–5.45)
RDW: 14 % (ref 11.2–14.5)
WBC: 5.6 10*3/uL (ref 3.9–10.3)
lymph#: 1 10*3/uL (ref 0.9–3.3)
nRBC: 0 % (ref 0–0)

## 2013-09-29 MED ORDER — HEPARIN SOD (PORK) LOCK FLUSH 100 UNIT/ML IV SOLN
500.0000 [IU] | Freq: Once | INTRAVENOUS | Status: AC | PRN
  Administered 2013-09-29: 500 [IU]
  Filled 2013-09-29: qty 5

## 2013-09-29 MED ORDER — SODIUM CHLORIDE 0.9 % IJ SOLN
10.0000 mL | INTRAMUSCULAR | Status: DC | PRN
  Administered 2013-09-29: 10 mL
  Filled 2013-09-29: qty 10

## 2013-09-29 MED ORDER — SODIUM CHLORIDE 0.9 % IV SOLN
Freq: Once | INTRAVENOUS | Status: AC
  Administered 2013-09-29: 15:00:00 via INTRAVENOUS

## 2013-09-29 MED ORDER — TRASTUZUMAB CHEMO INJECTION 440 MG
6.0000 mg/kg | Freq: Once | INTRAVENOUS | Status: AC
  Administered 2013-09-29: 399 mg via INTRAVENOUS
  Filled 2013-09-29: qty 19

## 2013-09-29 MED ORDER — DIPHENHYDRAMINE HCL 25 MG PO CAPS
ORAL_CAPSULE | ORAL | Status: AC
  Filled 2013-09-29: qty 2

## 2013-09-29 MED ORDER — ACETAMINOPHEN 325 MG PO TABS
650.0000 mg | ORAL_TABLET | Freq: Once | ORAL | Status: AC
  Administered 2013-09-29: 650 mg via ORAL

## 2013-09-29 MED ORDER — ACETAMINOPHEN 325 MG PO TABS
ORAL_TABLET | ORAL | Status: AC
  Filled 2013-09-29: qty 2

## 2013-09-29 MED ORDER — DIPHENHYDRAMINE HCL 25 MG PO CAPS
50.0000 mg | ORAL_CAPSULE | Freq: Once | ORAL | Status: AC
  Administered 2013-09-29: 50 mg via ORAL

## 2013-09-29 NOTE — Patient Instructions (Signed)
[facility] Discharge Instructions for Patients Receiving Chemotherapy  Today you received the following chemotherapy agents herceptin   To help prevent nausea and vomiting after your treatment, we encourage you to take your nausea medication as directed   If you develop nausea and vomiting that is not controlled by your nausea medication, call the clinic.   BELOW ARE SYMPTOMS THAT SHOULD BE REPORTED IMMEDIATELY:  *FEVER GREATER THAN 100.5 F  *CHILLS WITH OR WITHOUT FEVER  NAUSEA AND VOMITING THAT IS NOT CONTROLLED WITH YOUR NAUSEA MEDICATION  *UNUSUAL SHORTNESS OF BREATH  *UNUSUAL BRUISING OR BLEEDING  TENDERNESS IN MOUTH AND THROAT WITH OR WITHOUT PRESENCE OF ULCERS  *URINARY PROBLEMS  *BOWEL PROBLEMS  UNUSUAL RASH Items with * indicate a potential emergency and should be followed up as soon as possible.  Feel free to call the clinic you have any questions or concerns. The clinic phone number is [phone].  

## 2013-09-29 NOTE — Progress Notes (Addendum)
OFFICE PROGRESS NOTE  CC  Jasmine Shin, MD [address] [place] [address] Dr. Thea Schwartz  Dr. 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 At that time she had mammogram performed and it was negative. However her breast continue to increase in size. Initially patient felt that it could have been cysts or something on and therefore she did not go t o a physician. However subsequently patient's breast became quite painful and she also began to feel in knot under her right arm.because of this she went to her physician who did a physical exam and a mammogram was performed on 01/01/2013 the mammogram showed diffuse skin thickening generalized increased density within the right breast and enlarged right axillary lymph nodes. She went on to have an ultrasound of the right breast performed that showed a large spiculated hypoechoic mass in the right breast 11:00 2 cm from the nipple measuring at least 4.5 cm in size. There were abnormal lymph nodes with thickened cortex and the right axilla the largest one measuring 3.1 cm in dimension.  #2Patient was recommended ultrasound guided core biopsy of the right breast mass and the right axilla lymph node. The biopsy was performed on 01/01/2013. The right needle core biopsy of the primary mass showed invasive mammary carcinoma grade 2-3 was invasive ductal. The lymph node of the right axilla was positive for metastatic disease. The tumor was ER PR negative HER-2/neu positive with a Ki-67 of 76%. On 01/09/2013 patient had MRI of the breasts performed that showed diffuse right breast skin thickening and edema with right axillary lymph node. There was a 5 mm internal mammary lymph node that  was incompletely imaged. The right breast mass measured 8 x 5 x 4.7 cm. There were abnormal areas involving all 4 quadrants. There was however no involvement of the pectoralis minor for lower chest wall. The left breast showed 2 areas of nodular enhancement these were biopsied and were fibroadenomas  #3 it was recommended that patient begin neoadjuvant chemotherapy consisting of Taxotere carboplatinum Herceptin and perjeta. This would be given every 3 weeks with day 2 Neulasta. A total of 6 cycles of therapy were planned. Once she completes the treatment then she would proceed with MRI of the breasts again for evaluation a response to therapy and eventually a mastectomy.  #4 patient is status post Neoadjuvant with curative intent Taxotere carboplatinum Herceptin and perjeta starting 01/16/2013 - 05/01/2013.  #5 patient is status post bilateral mastectomies with the left breast revealing no evidence of cancer. Right breast reveals no residual carcinoma 14 right axillary lymph nodes were negative for metastatic disease.  #6 patient began adjuvant Herceptin starting 06/16/2013. Patient will complete out 1 year of adjuvant Herceptin.  #7  Patient is s/p adjuvant radiation therapy from 07/27/2013-09/11/2013.   CURRENT THERAPY:  Adjuvant herceptin beginning 06/16/13  INTENT: Curative  INTERVAL HISTORY: Jasmine Schwartz 48 y.o. female returns for followup visit today.  She is feeling well.  She had been having headaches recently and underwent an MRI of the brain which was normal.  Her insurance has changed her insulin approval and she has had to change to Novolog from Humalog.  Her blood sugar has been more difficult to manage and she believes  that was contributing to her headaches.  She denies chest pain, palpitations, swelling, DOE, orthopnea or further concerns.  Otherwise, a 10 point ROS is negative.    MEDICAL HISTORY: Past Medical History  Diagnosis Date  . Anxiety   . Depression   . Dyslipidemia    . Anxiety and depression 07/06/2011  . Hx of insertion of insulin pump ~ 2001 to present (06/09/2013)  . PONV (postoperative nausea and vomiting)   . Venous insufficiency   . Lower extremity edema   . Diabetes mellitus     IDDM  . Type I diabetes mellitus     uses insulin pump  . Breast cancer 01/01/13    /metastatic 1/1 lymph nodes    ALLERGIES:  has No Known Allergies.  MEDICATIONS:  Current Outpatient Prescriptions  Medication Sig Dispense Refill  . cyanocobalamin 500 MCG tablet Take 500 mcg by mouth daily.      Marland Kitchen glucose blood (NOVA MAX TEST) test strip Use as directed up to 8 times a day      . insulin aspart (NOVOLOG) 100 UNIT/ML injection Inject as directed into Insulin pump, total of 40 units/day  20 mL  11  . Insulin Infusion Pump (MINIMED INSULIN PUMP) DEVI Inject 1 Units/hr into the vein continuous. Pt give extra per cards at meal times. Basic meals are about 5 units      . Insulin Infusion Pump Supplies (MINIMED INFUSION SET-MMT 397) MISC 1 Device by Does not apply route every 3 (three) days.      . Insulin Infusion Pump Supplies (PARADIGM PUMP RESERVOIR 1.76ML) MISC 1 Device by Does not apply route every 3 (three) days.      Marland Kitchen LORazepam (ATIVAN) 0.5 MG tablet Take 1 tablet (0.5 mg total) by mouth every 8 (eight) hours as needed for anxiety.  30 tablet  2  . UNABLE TO FIND Rx: Y1017- Mastectomy Form, Bilateral (Quantity: 2) L8000- Mastectomy Bra (Quantity: 2) Dx: 174.9; Bilateral Mastectomy  1 each  0  . UNABLE TO FIND Rx: P1025- Post Mastectomy Garment (Quantity: 2) Dx: 174.9; Bilateral Mastectomy  1 each  0   No current facility-administered medications for this visit.    SURGICAL HISTORY:  Past Surgical History  Procedure Laterality Date  . Lumbar disc surgery  2009    Dr Maxie Better  . Cesarean section  1989  . Tubal ligation  1991  . Breast biopsy Right 01/01/13    Invasive mammary ca,metastatic in 1/1 lymph node  . Breast biopsy Left 01/09/13     Fibroadenoma,microcalcifications  . Portacath placement Left 01/2013  . Mastectomy modified radical Right 06/09/2013  . Mastectomy complete / simple Left 06/09/2013  . Mastectomy modified radical Right 06/09/2013    Procedure: RIGHT MASTECTOMY MODIFIED RADICAL;  Surgeon: Adin Hector, MD;  Location: [facility];  Service: General;  Laterality: Right;  . Simple mastectomy with axillary sentinel node biopsy Left 06/09/2013    Procedure: LEFT SIMPLE MASTECTOMY;  Surgeon: Adin Hector, MD;  Location: [facility];  Service: General;  Laterality: Left;    REVIEW OF SYSTEMS:  A 10 point review of systems was conducted and is otherwise negative except for what is noted above.    PHYSICAL EXAMINATION: Blood pressure 139/67, pulse 99, temperature 97.9 F (36.6 C), temperature source Oral, resp. rate 18, height _0  (1.6 m), weight 149 lb 12.8 oz (67.949 kg). Body mass index is 26.54 kg/(m^2). GENERAL: Patient is a well appearing female in no acute distress HEENT:  Sclerae anicteric.  Oropharynx clear and moist. No ulcerations or evidence of oropharyngeal candidiasis. Neck is supple.  NODES:  No cervical, supraclavicular, or axillary lymphadenopathy palpated.  BREAST EXAM:  deferred LUNGS:  Clear to auscultation bilaterally.  No wheezes or rhonchi. HEART:  Regular rate and rhythm. No murmur appreciated. ABDOMEN:  Soft, nontender.  Positive, normoactive bowel sounds. No organomegaly palpated. MSK:  No focal spinal tenderness to palpation. Full range of motion bilaterally in the upper extremities. EXTREMITIES:  No peripheral edema.   SKIN:  Clear with no obvious rashes or skin changes. No nail dyscrasia. NEURO:  Nonfocal. Well oriented.  Appropriate affect. ECOG PERFORMANCE STATUS: 0 - Asymptomatic  LABORATORY DATA: Lab Results  Component Value Date   WBC 5.6 09/29/2013   HGB 11.9 09/29/2013   HCT 34.9 09/29/2013   MCV 82.5 09/29/2013   PLT 257 09/29/2013      Chemistry      Component Value  Date/Time   NA 139 09/08/2013 1359   NA 139 08/18/2013 1501   K 4.2 09/08/2013 1359   K 4.3 08/18/2013 1501   CL 101 08/18/2013 1501   CL 99 01/23/2013 1251   CO2 25 09/08/2013 1359   CO2 24 08/18/2013 1501   BUN 18.2 09/08/2013 1359   BUN 19 08/18/2013 1501   CREATININE 0.7 09/08/2013 1359   CREATININE 0.68 08/18/2013 1501      Component Value Date/Time   CALCIUM 9.2 09/08/2013 1359   CALCIUM 9.3 08/18/2013 1501   ALKPHOS 71 09/08/2013 1359   ALKPHOS 71 08/18/2013 1501   AST 20 09/08/2013 1359   AST 22 08/18/2013 1501   ALT 19 09/08/2013 1359   ALT 21 08/18/2013 1501   BILITOT 0.39 09/08/2013 1359   BILITOT 0.3 08/18/2013 1501     Pathology 06/09/2013:  Diagnosis 1. Breast, simple mastectomy, Left - FIBROCYSTIC CHANGE WITH ADENOSIS AND CALCIFICATIONS. - NO MALIGNANCY IDENTIFIED. 2. Breast, modified radical mastectomy , Right - BENIGN BREAST TISSUE WITH EXTENSIVE FIBROSIS CONSISTENT WITH TREATMENT EFFECT. - NO RESIDUAL CARCINOMA IDENTIFIED. - FOURTEEN LYMPH NODES NEGATIVE FOR CARCINOMA WITH FOCAL HYLANIZATION (0/14). - SEE ONCOLOGY TABLE AND COMMENT. Microscopic Comment 2. BREAST, INVASIVE TUMOR, WITH LYMPH NODE SAMPLING Specimen, including laterality and lymph node sampling (sentinel, non-sentinel): Right breast and axillary contents. Procedure: Right modified radical mastectomy. Histologic type, tumor grade, tumor size, margins, lympovascular invasion, tumor focality, and extent of tumor can not be assessed, as there is no residual carcinoma. Ductal carcinoma in situ: Absent. Lobular neoplasia: Absent. Treatment effect: Present, extensive. If present, treatment effect in breast tissue, lymph nodes or both: Both. Lymph nodes: Examined: 14 Total Lymph nodes with metastasis: 0 Isolated tumor cells (< 0.2 mm): 0 Micrometastasis: (> 0.2 mm and < 2.0 mm): 0 Macrometastasis: (> 2.0 mm): 0 Extracapsular extension: 0 Breast prognostic profile: [id number] Estrogen receptor: Negative. Progesterone  receptor: Negative. Her 2 neu: Amplified. Ki-67: 76% Non-neoplastic breast: Fibrocystic change. 1 of 3 FINAL for STELLAH, DONOVAN [phone]) Microscopic Comment(continued) TNM: ypT0, ypN0 Comments: There is no residual tumor in the current breast specimen, status post neoadjuvant chemotherapy, and therefore it is best staged as ypT0. Many of the nodes exhibit fibrosis consistent with treatment effect. Immunohistochemistry for pancytokeratin was performed on the two largest lymph nodes and was negative for carcinoma, resulting in a stage of ypN0. Vicente Males MD Pathologist, Electronic Signature (Case signed 06/11/2013) Specimen Gross and Clinical  RADIOGRAPHIC STUDIES:  Ct Chest W Contrast  01/20/2013   *RADIOLOGY REPORT*  Clinical Data:  High risk breast cancer.  Evaluate for metastatic disease.  CT CHEST, ABDOMEN AND PELVIS WITH CONTRAST  Technique:  Multidetector CT imaging of the chest, abdomen and pelvis was performed following the standard protocol during bolus administration of intravenous contrast.  Contrast: 163m OMNIPAQUE IOHEXOL 300 MG/ML  SOLN  Comparison:  None  CT CHEST  Findings:  Lungs/pleura: There is no pleural effusion.  No suspicious pulmonary nodule or mass noted.  Heart/Mediastinum: Heart size is normal.  No pericardial effusion. No mediastinal or hilar adenopathy.  Bones/Musculoskeletal:  Asymmetric increased soft tissue within the right breast parenchyma corresponding to patient's biopsy-proven invasive breast carcinoma.  There is associated overlying skin thickening.  Multiple enlarged right axillary lymph nodes are identified.  Index lymph node measures 1.7 cm, image 23/series 2. Several enhancing, sub centimeter retropectoral lymph nodes are identified.  IMPRESSION:  1.  No acute findings. 2.  Right breast invasive carcinoma with associated right axillary adenopathy.  CT ABDOMEN AND PELVIS  Findings:  There are no focal liver abnormalities identified.  The gallbladder  appears normal.  There is no biliary dilatation.  The pancreas is unremarkable.  Normal appearance of the spleen.  The adrenal glands are both normal.  The kidneys are unremarkable. Urinary bladder appears normal.  The uterus is unremarkable. Corpus luteal cyst is noted in the left ovary measuring 1.6 cm.  No free fluid or fluid collections identified within the upper abdomen or pelvis.  The abdominal aorta has a normal caliber. There is no adenopathy identified within the upper abdomen.  No pelvic or inguinal adenopathy noted.  The stomach is normal.  The small bowel loops are unremarkable. Normal appearance of the colon.  Review of the visualized osseous structures is negative for aggressive lytic or sclerotic bone lesion.  IMPRESSION:  1.  No acute findings within the abdomen or pelvis. 2.  No mass or adenopathy.   Original Report Authenticated By: TKerby Moors M.D.   [place]ast Left  01/09/2013   *RADIOLOGY REPORT*  Clinical Data:  48year old female with newly diagnosed right breast cancer.  Left mammogram to compare to presurgical MRI.  DIGITAL DIAGNOSTIC LEFT MAMMOGRAM WITH CAD AND LEFT BREAST ULTRASOUND:  Comparison:  06/11/2012 mammogram and 01/09/2013 MRI  Findings:  ACR Breast Density Category 3: The breast tissue is heterogeneously dense.  A possible circumscribed mass in the upper outer left breast is identified. No suspicious calcifications or distortion identified.  Mammographic images were processed with CAD.  On physical exam, no palpable abnormalities identified within the upper left breast.  Ultrasound is performed, showing a 6 x 6 x 8 mm slightly irregular hypoechoic mass at the 11 o'clock position of the left breast 5 cm from the nipple An 11 x 7 x 9 mm slightly irregular hypoechoic mass at the 2 o'clock position of the left breast 4 cm from the nipple is also present. No enlarged or abnormal appearing left axillary lymph nodes are identified.  IMPRESSION: Indeterminate 6 x 6 x 8 mm  hypoechoic mass at the 11 o'clock position of the left breast and 11 x 7 x 9 mm hypoechoic mass in the 2 o'clock position of the left breast.  The 11 o'clock position mass probably represents the enhancing nodule identified on today's MRI.  Tissue sampling of both masses is recommended.  BI-RADS CATEGORY 4:  Suspicious abnormality - biopsy should be considered.  RECOMMENDATION: Ultrasound guided biopsies of both left breast masses.  The findings and recommendations were discussed with the patient and she  desires to proceed with ultrasound guided left breast biopsies. These biopsies will be performed today but dictated in a separate report.  Results were also provided in writing at the conclusion of the visit.  If applicable, a reminder letter will be sent to the patient regarding the next appointment.   Original Report Authenticated By: Margarette Canada, M.D.   Ct Abdomen Pelvis W Contrast  01/22/2013   *RADIOLOGY REPORT*  Clinical Data:  High risk breast cancer.  Evaluate for metastatic disease.  CT CHEST, ABDOMEN AND PELVIS WITH CONTRAST  Technique:  Multidetector CT imaging of the chest, abdomen and pelvis was performed following the standard protocol during bolus administration of intravenous contrast.  Contrast: 111m OMNIPAQUE IOHEXOL 300 MG/ML  SOLN  Comparison:  None  CT CHEST  Findings:  Lungs/pleura: There is no pleural effusion.  No suspicious pulmonary nodule or mass noted.  Heart/Mediastinum: Heart size is normal.  No pericardial effusion. No mediastinal or hilar adenopathy.  Bones/Musculoskeletal:  Asymmetric increased soft tissue within the right breast parenchyma corresponding to patient's biopsy-proven invasive breast carcinoma.  There is associated overlying skin thickening.  Multiple enlarged right axillary lymph nodes are identified.  Index lymph node measures 1.7 cm, image 23/series 2. Several enhancing, sub centimeter retropectoral lymph nodes are identified.  IMPRESSION:  1.  No acute findings.  2.  Right breast invasive carcinoma with associated right axillary adenopathy.  CT ABDOMEN AND PELVIS  Findings:  There are no focal liver abnormalities identified.  The gallbladder appears normal.  There is no biliary dilatation.  The pancreas is unremarkable.  Normal appearance of the spleen.  The adrenal glands are both normal.  The kidneys are unremarkable. Urinary bladder appears normal.  The uterus is unremarkable. Corpus luteal cyst is noted in the left ovary measuring 1.6 cm.  No free fluid or fluid collections identified within the upper abdomen or pelvis.  The abdominal aorta has a normal caliber. There is no adenopathy identified within the upper abdomen.  No pelvic or inguinal adenopathy noted.  The stomach is normal.  The small bowel loops are unremarkable. Normal appearance of the colon.  Review of the visualized osseous structures is negative for aggressive lytic or sclerotic bone lesion.  IMPRESSION:  1.  No acute findings within the abdomen or pelvis. 2.  No mass or adenopathy.   Original Report Authenticated By: TKerby Moors M.D.   Mr Breast Bilateral W Wo Contrast  01/09/2013   *RADIOLOGY REPORT*  Clinical Data: Biopsy-proven right breast inflammatory carcinoma with primary invasive mammary carcinoma and previous biopsy and known right axillary metastatic disease.  Preoperative staging MRI.  BUN and creatinine were obtained on site at [facility] [address] Results:  BUN 10 mg/dL,  Creatinine 0.9 mg/dL.  BILATERAL BREAST MRI WITH AND WITHOUT CONTRAST  Technique: Multiplanar, multisequence MR images of both breasts were obtained prior to and following the intravenous administration of 17mof Multihance.  Three dimensional images were evaluated at the independent DynaCad workstation.  Comparison:  Prior mammograms and ultrasound.  The patient underwent diagnostic mammography and ultrasound of the left breast today as no recent prior exam was available after the recent  diagnosis in the right breast.  Findings: There is diffuse right breast skin thickening and edema, trabecular edema, and right axillary lymphadenopathy with maximal nodal measurement 3.1 cm short-axis diameter.  There is also a 5 mm internal mammary artery chain lymph node identified image 26 series 2. There is an incompletely visualized lymph node  subjacent to the pectoralis minor muscle on the first image of the T2-weighted series.  There is a large irregular enhancing mass in the right breast 11 o'clock location with associated clip artifact measuring maximally 8.0 x 5.6 by 4.7 cm. This demonstrates washin/washout type enhancement kinetics.  This corresponds to the biopsy-proven invasive mammary carcinoma.  There are abnormal nodular areas of trabecular has been throughout the right breast involving all four quadrants. No abnormal underlying pectoralis enhancement or chest wall involvement is identified.  In the left breast 11 o'clock location, there is an 8 mm enhancing nodule corresponding to that seen at the examination earlier today. This area demonstrates plateau type enhancement kinetics.  No left- sided axillary or internal mammary artery chain lymphadenopathy is identified.  There is mild inhomogeneous nodular enhancement in the left breast two o'clock location at the site of the mass seen in this location earlier today, measuring 9 mm, measuring progressive enhancement kinetics.  IMPRESSION: Large irregular enhancing right breast mass 11 o'clock location corresponding to biopsy-proven invasive mammary carcinoma, with metastatic right axillary lymphadenopathy as well as skin and trabecular thickening/enhancement compatible with the clinical diagnosis of inflammatory and/or locally advanced breast cancer.  5 mm abnormal right internal mammary artery chain lymph node.  8 mm enhancing mass left breast 11 o'clock location and mildly irregular area of mass-like enhancement in the left breast two o'clock  location, corresponding to those seen at diagnostic examination earlier today.  Biopsy has been scheduled for today and will be dictated separately.  RECOMMENDATION: Treatment plan  THREE-DIMENSIONAL MR IMAGE RENDERING ON INDEPENDENT WORKSTATION:  Three-dimensional MR images were rendered by post-processing of the original MR data on an independent workstation.  The three- dimensional MR images were interpreted, and findings were reported in the accompanying complete MRI report for this study.  BI-RADS CATEGORY 6:  Known biopsy-proven malignancy - appropriate action should be taken.   Original Report Authenticated By: Conchita Paris, M.D.   Nm Pet Image Initial (pi) Skull Base To Thigh  01/20/2013   *RADIOLOGY REPORT*  Clinical Data: Initial treatment strategy for breast cancer.  NUCLEAR MEDICINE PET SKULL BASE TO THIGH  Fasting Blood Glucose:  263  Technique:  19.1 mCi F-18 FDG was injected intravenously. CT data was obtained and used for attenuation correction and anatomic localization only.  (This was not acquired as a diagnostic CT examination.) Additional exam technical data entered on technologist worksheet.  Comparison:  None  Findings:  Neck: No hypermetabolic lymph nodes in the neck.  Chest:  No hypermetabolic mediastinal or hilar nodes.  No suspicious pulmonary nodules on the CT scan. Enlarged right axillary lymph nodes identified.  Index lymph node measures 1.7 cm and has an SUV max equal to 2.8, image 77.  There is asymmetric increased uptake throughout the right breast with associated overlying skin thickening.  This has an SUV max equal to 4.4, image 101.  Abdomen/Pelvis:  No abnormal hypermetabolic activity within the liver, pancreas, adrenal glands, or spleen.  No hypermetabolic lymph nodes in the abdomen or pelvis. There is a small focus of increased FDG uptake within the left ovary.  This has an SUV max equal to 13.1, image 217.  Skeleton:  No focal hypermetabolic activity to suggest skeletal  metastasis.  IMPRESSION:  1.  Asymmetric increased uptake associated with the right breast and right breast skin thickening corresponding to biopsy-proven invasive breast cancer. 2.  Enlarged and hypermetabolic right axillary lymph nodes consistent with metastatic adenopathy. 3.  No evidence for distant  metastatic disease. 4.  Focal area of increased uptake within the left axilla.  The patient is noted to have a corpus luteal cyst are not diagnostic CT images.  This activity is favored to represent physiologic activity.  Consider confirmatory imaging with pelvic sonogram.   Original Report Authenticated By: Kerby Moors, M.D.   Ir Fluoro Guide Cv Line Left  01/15/2013   *RADIOLOGY REPORT*  Clinical Data/Indication: Breast cancer  TUNNEL POWER PORT PLACEMENT WITH SUBCUTANEOUS POCKET UTILIZING ULTRASOUND & FLOUROSCOPY  Sedation: Versed 5.0 mg, Fentanyl 250 mcg.  Total Moderate Sedation Time: 35 minutes.  As antibiotic prophylaxis, Ancef  was ordered pre-procedure and administered intravenously within one hour of incision.  Fluoroscopy Time: 36 seconds.  Procedure:  After written informed consent was obtained, patient was placed in the supine position on angiographic table. The left neck and chest was prepped and draped in a sterile fashion. Lidocaine was utilized for local anesthesia.  The left internal jugular vein was noted to be patent initially with ultrasound. Under sonographic guidance, a micropuncture needle was inserted into the left IJ vein (Ultrasound and fluoroscopic image documentation was performed). The needle was removed over an 018 wire which was exchanged for a Amplatz.  This was advanced into the IVC.  An 8-French dilator was advanced over the Amplatz.  A small incision was made in the left upper chest over the anterior left second rib.  Utilizing blunt dissection, a subcutaneous pocket was created in the caudal direction. The pocket was irrigated with a copious amount of sterile normal saline.   The port catheter was tunneled from the chest incision, and out the neck incision.  The reservoir was inserted into the subcutaneous pocket and secured with two 3-0 Ethilon stitches.  A peel-away sheath was advanced over the Amplatz wire.  The port catheter was cut to measure length and inserted through the peel-away sheath.  The peel-away sheath was removed.  The chest incision was closed with 3-0 Vicryl interrupted stitches for the subcutaneous tissue and a running of 4- 0 Vicryl subcuticular stitch for the skin.  The neck incision was closed with a 4-0 Vicryl subcuticular stitch.  Derma-bond was applied to both surgical incisions.  The port reservoir was flushed and instilled with heparinized saline.  No complications.  Findings:  A left IJ vein Port-A-Cath is in place with its tip at the cavoatrial junction.  IMPRESSION: Successful 8 French left internal jugular vein power port placement with its tip at the SVC/RA junction.   Original Report Authenticated By: Marybelle Killings, M.D.   Ir US Guide Vasc Access Left  01/15/2013   *RADIOLOGY REPORT*  Clinical Data/Indication: Breast cancer  TUNNEL POWER PORT PLACEMENT WITH SUBCUTANEOUS POCKET UTILIZING ULTRASOUND & FLOUROSCOPY  Sedation: Versed 5.0 mg, Fentanyl 250 mcg.  Total Moderate Sedation Time: 35 minutes.  As antibiotic prophylaxis, Ancef  was ordered pre-procedure and administered intravenously within one hour of incision.  Fluoroscopy Time: 36 seconds.  Procedure:  After written informed consent was obtained, patient was placed in the supine position on angiographic table. The left neck and chest was prepped and draped in a sterile fashion. Lidocaine was utilized for local anesthesia.  The left internal jugular vein was noted to be patent initially with ultrasound. Under sonographic guidance, a micropuncture needle was inserted into the left IJ vein (Ultrasound and fluoroscopic image documentation was performed). The needle was removed over an 018 wire which  was exchanged for a Amplatz.  This was advanced into the IVC.  An  8-French dilator was advanced over the Amplatz.  A small incision was made in the left upper chest over the anterior left second rib.  Utilizing blunt dissection, a subcutaneous pocket was created in the caudal direction. The pocket was irrigated with a copious amount of sterile normal saline.  The port catheter was tunneled from the chest incision, and out the neck incision.  The reservoir was inserted into the subcutaneous pocket and secured with two 3-0 Ethilon stitches.  A peel-away sheath was advanced over the Amplatz wire.  The port catheter was cut to measure length and inserted through the peel-away sheath.  The peel-away sheath was removed.  The chest incision was closed with 3-0 Vicryl interrupted stitches for the subcutaneous tissue and a running of 4- 0 Vicryl subcuticular stitch for the skin.  The neck incision was closed with a 4-0 Vicryl subcuticular stitch.  Derma-bond was applied to both surgical incisions.  The port reservoir was flushed and instilled with heparinized saline.  No complications.  Findings:  A left IJ vein Port-A-Cath is in place with its tip at the cavoatrial junction.  IMPRESSION: Successful 8 French left internal jugular vein power port placement with its tip at the SVC/RA junction.   Original Report Authenticated By: Marybelle Killings, M.D.   Mm Digital Diagnostic Unilat L  01/09/2013   *RADIOLOGY REPORT*  Clinical Data:  Evaluate clip placement following two separate ultrasound-guided left breast biopsies.  DIGITAL DIAGNOSTIC LEFT MAMMOGRAM  Comparison:  Previous exams  Findings:  Films are performed following two separate ultrasound guided biopsies of the left breast - at the 11 o'clock position and 2 o'clock position. The ribbon shaped biopsy clip corresponds to the 11 o'clock mass and the T shaped biopsy clip corresponds to the 2 o'clock mass. No immediate complications are identified.  IMPRESSION: Satisfactory  clip placement following ultrasound guided left breast biopsies.  Pathology will be followed.   Original Report Authenticated By: Margarette Canada, M.D.   Mm Digital Diagnostic Unilat L  01/09/2013   *RADIOLOGY REPORT*  Clinical Data:  48 year old female with newly diagnosed right breast cancer.  Left mammogram to compare to presurgical MRI.  DIGITAL DIAGNOSTIC LEFT MAMMOGRAM WITH CAD AND LEFT BREAST ULTRASOUND:  Comparison:  06/11/2012 mammogram and 01/09/2013 MRI  Findings:  ACR Breast Density Category 3: The breast tissue is heterogeneously dense.  A possible circumscribed mass in the upper outer left breast is identified. No suspicious calcifications or distortion identified.  Mammographic images were processed with CAD.  On physical exam, no palpable abnormalities identified within the upper left breast.  Ultrasound is performed, showing a 6 x 6 x 8 mm slightly irregular hypoechoic mass at the 11 o'clock position of the left breast 5 cm from the nipple An 11 x 7 x 9 mm slightly irregular hypoechoic mass at the 2 o'clock position of the left breast 4 cm from the nipple is also present. No enlarged or abnormal appearing left axillary lymph nodes are identified.  IMPRESSION: Indeterminate 6 x 6 x 8 mm hypoechoic mass at the 11 o'clock position of the left breast and 11 x 7 x 9 mm hypoechoic mass in the 2 o'clock position of the left breast.  The 11 o'clock position mass probably represents the enhancing nodule identified on today's MRI.  Tissue sampling of both masses is recommended.  BI-RADS CATEGORY 4:  Suspicious abnormality - biopsy should be considered.  RECOMMENDATION: Ultrasound guided biopsies of both left breast masses.  The findings and recommendations were discussed with  the patient and she desires to proceed with ultrasound guided left breast biopsies. These biopsies will be performed today but dictated in a separate report.  Results were also provided in writing at the conclusion of the visit.  If  applicable, a reminder letter will be sent to the patient regarding the next appointment.   Original Report Authenticated By: Margarette Canada, M.D.   [place] Lt Breast Bx W Loc Dev 1st Lesion Img Bx Spec US Guide  01/12/2013   *RADIOLOGY REPORT*  Clinical Data:  48 year old female with newly diagnosed right breast cancer.  Two left breast masses identified sonographically - 6 x 8 mm at the 11 o'clock position and 9 x 11 mm at the 2 o'clock position. For tissue sampling of the 6 x 8 mm mass at the 11 o'clock position.  ULTRASOUND GUIDED VACUUM ASSISTED CORE BIOPSY OF THE LEFT BREAST  Comparison: Previous exams.  I met with the patient and we discussed the procedure of ultrasound- guided biopsy, including benefits and alternatives.  We discussed the high likelihood of a successful procedure. We discussed the risks of the procedure including infection, bleeding, tissue injury, clip migration, and inadequate sampling.  Informed written consent was given.  Using sterile technique and 2% Lidocaine as local anesthetic, under direct ultrasound visualization, a12 gauge vacuum-assisteddevice was used to perform biopsy of the 6 x 8 mm hypoechoic mass at the 11 o'clock position of the left breast 5 cm from the nipple using a medial approach. At the conclusion of the procedure, a ribbon shaped tissue marker clip was deployed into the biopsy cavity. Follow-up 2-view mammogram was performed and dictated separately.  The usual time-out protocol was performed immediately prior to the procedure.  IMPRESSION: Ultrasound-guided biopsy of 6 x 8 mm hypoechoic mass at the 11 o'clock position of the left breast.  No apparent complications.  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  Histology correlates with imaging findings.  The patient was contacted by phone on 01/12/2013 and these results given to her which she understood. Her questions were answered. The patient had no complaints  with her biopsy site.  Recommend treatment plan.   Original Report Authenticated By: Margarette Canada, M.D.   [place] Lt Breast Bx W Loc Dev Ea Add Lesion Img Bx Spec US Guide  01/12/2013   *RADIOLOGY REPORT*  Clinical Data:  48 year old female with newly diagnosed right breast cancer.  Two left breast masses identified sonographically - 6 x 8 mm at the 11 o'clock position and 9 x 11 mm at the 2 o'clock position. For tissue sampling of the 9 x 11 mm mass at the 2 o'clock position.  ULTRASOUND GUIDED VACUUM ASSISTED CORE BIOPSY OF THE LEFT BREAST  Comparison: Previous exams.  I met with the patient and we discussed the procedure of ultrasound- guided biopsy, including benefits and alternatives.  We discussed the high likelihood of a successful procedure. We discussed the risks of the procedure including infection, bleeding, tissue injury, clip migration, and inadequate sampling.  Informed written consent was given.  Using sterile technique and 2% Lidocaine as local anesthetic, under direct ultrasound visualization, a 12 gauge vacuum-assisteddevice was used to perform biopsy of the 9 x 11 mm hypoechoic mass at the 2 o'clock position of the left breast 4 cm from the nipple using a medial approach. At the conclusion of the procedure, a  tissue marker clip was deployed into the biopsy cavity.  Follow-up 2-view mammogram was performed and  dictated separately.  The usual time-out protocol was performed immediately prior to the procedure.  IMPRESSION: Ultrasound-guided biopsy of 9 x 11 mm mass in the 2 o'clock position of the left breast.  No apparent complications.  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 Histology correlates with imaging findings.  The patient was contacted by phone on 01/12/2013 and these results given to her which she understood. Her questions were answered. The patient had no complaints with her biopsy site.  Recommend treatment plan.   Original  Report Authenticated By: Margarette Canada, M.D.    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 (T3 N1) Patient was administered adjuvant chemotherapy consisting of Taxotere carboplatinum Herceptin and perjeta x6 cycles from 01/16/2013 through 05/01/2013. Overall she tolerated it well.  #2 patient is now status post bilateral mastectomies on 06/09/2013 with the final pathology revealing no evidence of residual carcinoma in the right breast 14 lymph nodes were negative for metastatic disease. With the final pathologic staging at T0 N0 with complete pathologic response to chemotherapy.  #3 patient to proceed with adjuvant Herceptin every 3 weeks to complete one year. She is followed by cardiology as well for ongoing echocardiograms and evaluation for Herceptin induced cardio toxicity.  Her last appointment was on 09/08/13 and she was recommended to proceed with herceptin with 3 month follow up.    #4 patient will not receive antiestrogen therapy since her tumor is estrogen receptor and progesterone receptor negative  #5 She has underwent adjuvant radiation at [facility] from 07/27/2013-09/11/2013.  PLAN:   #1  Patient is doing well today.  She will proceed with Herceptin.  She was recently evaluated by Dr. Marigene Ehlers and cleared to continue.  I reviewed her labs with her in detail.  They were normal.  I also reviewed her MRI results which were normal  #2  She will return in 3 weeks for labs, eval and Herceptin therapy.    All questions were answered. The patient knows to call the clinic with any problems, questions or concerns. We can certainly see the patient much sooner if necessary.  I spent 25 minutes counseling the patient face to face. The total time spent in the appointment was 30 minutes.  Minette Headland, [place] [phone]

## 2013-10-14 ENCOUNTER — Telehealth: Payer: Self-pay | Admitting: Oncology

## 2013-10-14 NOTE — Telephone Encounter (Signed)
, °

## 2013-10-15 ENCOUNTER — Other Ambulatory Visit: Payer: BC Managed Care – PPO

## 2013-10-15 ENCOUNTER — Encounter: Payer: BC Managed Care – PPO | Admitting: Genetic Counselor

## 2013-10-20 ENCOUNTER — Ambulatory Visit: Payer: BC Managed Care – PPO | Admitting: Genetic Counselor

## 2013-10-20 ENCOUNTER — Ambulatory Visit (HOSPITAL_BASED_OUTPATIENT_CLINIC_OR_DEPARTMENT_OTHER): Payer: BC Managed Care – PPO

## 2013-10-20 ENCOUNTER — Encounter: Payer: Self-pay | Admitting: Genetic Counselor

## 2013-10-20 ENCOUNTER — Other Ambulatory Visit (HOSPITAL_BASED_OUTPATIENT_CLINIC_OR_DEPARTMENT_OTHER): Payer: BC Managed Care – PPO

## 2013-10-20 ENCOUNTER — Ambulatory Visit (HOSPITAL_BASED_OUTPATIENT_CLINIC_OR_DEPARTMENT_OTHER): Payer: BC Managed Care – PPO | Admitting: Oncology

## 2013-10-20 ENCOUNTER — Encounter: Payer: Self-pay | Admitting: Oncology

## 2013-10-20 VITALS — BP 118/77 | HR 84 | Temp 98.4°F | Resp 18 | Ht 63.0 in | Wt 149.0 lb

## 2013-10-20 DIAGNOSIS — Z808 Family history of malignant neoplasm of other organs or systems: Secondary | ICD-10-CM

## 2013-10-20 DIAGNOSIS — C773 Secondary and unspecified malignant neoplasm of axilla and upper limb lymph nodes: Secondary | ICD-10-CM

## 2013-10-20 DIAGNOSIS — C50919 Malignant neoplasm of unspecified site of unspecified female breast: Secondary | ICD-10-CM

## 2013-10-20 DIAGNOSIS — C50911 Malignant neoplasm of unspecified site of right female breast: Secondary | ICD-10-CM

## 2013-10-20 DIAGNOSIS — C50419 Malignant neoplasm of upper-outer quadrant of unspecified female breast: Secondary | ICD-10-CM

## 2013-10-20 DIAGNOSIS — Z171 Estrogen receptor negative status [ER-]: Secondary | ICD-10-CM

## 2013-10-20 DIAGNOSIS — Z5112 Encounter for antineoplastic immunotherapy: Secondary | ICD-10-CM

## 2013-10-20 DIAGNOSIS — Z9010 Acquired absence of unspecified breast and nipple: Secondary | ICD-10-CM

## 2013-10-20 LAB — CBC WITH DIFFERENTIAL/PLATELET
BASO%: 0.5 % (ref 0.0–2.0)
BASOS ABS: 0 10*3/uL (ref 0.0–0.1)
EOS%: 2.5 % (ref 0.0–7.0)
Eosinophils Absolute: 0.2 10*3/uL (ref 0.0–0.5)
HEMATOCRIT: 36.1 % (ref 34.8–46.6)
HEMOGLOBIN: 12.1 g/dL (ref 11.6–15.9)
LYMPH%: 20 % (ref 14.0–49.7)
MCH: 28.1 pg (ref 25.1–34.0)
MCHC: 33.7 g/dL (ref 31.5–36.0)
MCV: 83.4 fL (ref 79.5–101.0)
MONO#: 0.4 10*3/uL (ref 0.1–0.9)
MONO%: 6.1 % (ref 0.0–14.0)
NEUT#: 4.6 10*3/uL (ref 1.5–6.5)
NEUT%: 70.9 % (ref 38.4–76.8)
Platelets: 309 10*3/uL (ref 145–400)
RBC: 4.33 10*6/uL (ref 3.70–5.45)
RDW: 14.3 % (ref 11.2–14.5)
WBC: 6.5 10*3/uL (ref 3.9–10.3)
lymph#: 1.3 10*3/uL (ref 0.9–3.3)

## 2013-10-20 LAB — COMPREHENSIVE METABOLIC PANEL (CC13)
ALBUMIN: 3.4 g/dL — AB (ref 3.5–5.0)
ALT: 31 U/L (ref 0–55)
ANION GAP: 8 meq/L (ref 3–11)
AST: 30 U/L (ref 5–34)
Alkaline Phosphatase: 82 U/L (ref 40–150)
BUN: 17 mg/dL (ref 7.0–26.0)
CALCIUM: 9.5 mg/dL (ref 8.4–10.4)
CO2: 28 meq/L (ref 22–29)
CREATININE: 0.7 mg/dL (ref 0.6–1.1)
Chloride: 102 mEq/L (ref 98–109)
Glucose: 151 mg/dl — ABNORMAL HIGH (ref 70–140)
POTASSIUM: 4.2 meq/L (ref 3.5–5.1)
Sodium: 138 mEq/L (ref 136–145)
TOTAL PROTEIN: 6.8 g/dL (ref 6.4–8.3)
Total Bilirubin: 0.37 mg/dL (ref 0.20–1.20)

## 2013-10-20 MED ORDER — DIPHENHYDRAMINE HCL 25 MG PO CAPS
50.0000 mg | ORAL_CAPSULE | Freq: Once | ORAL | Status: AC
  Administered 2013-10-20: 50 mg via ORAL

## 2013-10-20 MED ORDER — ACETAMINOPHEN 325 MG PO TABS
ORAL_TABLET | ORAL | Status: AC
  Filled 2013-10-20: qty 2

## 2013-10-20 MED ORDER — TRASTUZUMAB CHEMO INJECTION 440 MG
6.0000 mg/kg | Freq: Once | INTRAVENOUS | Status: AC
  Administered 2013-10-20: 399 mg via INTRAVENOUS
  Filled 2013-10-20: qty 19

## 2013-10-20 MED ORDER — DIPHENHYDRAMINE HCL 25 MG PO CAPS
ORAL_CAPSULE | ORAL | Status: AC
  Filled 2013-10-20: qty 2

## 2013-10-20 MED ORDER — HEPARIN SOD (PORK) LOCK FLUSH 100 UNIT/ML IV SOLN
500.0000 [IU] | Freq: Once | INTRAVENOUS | Status: AC | PRN
  Administered 2013-10-20: 500 [IU]
  Filled 2013-10-20: qty 5

## 2013-10-20 MED ORDER — SODIUM CHLORIDE 0.9 % IJ SOLN
10.0000 mL | INTRAMUSCULAR | Status: DC | PRN
  Administered 2013-10-20: 10 mL
  Filled 2013-10-20: qty 10

## 2013-10-20 MED ORDER — SODIUM CHLORIDE 0.9 % IV SOLN
Freq: Once | INTRAVENOUS | Status: AC
  Administered 2013-10-20: 15:00:00 via INTRAVENOUS

## 2013-10-20 MED ORDER — ACETAMINOPHEN 325 MG PO TABS
650.0000 mg | ORAL_TABLET | Freq: Once | ORAL | Status: AC
  Administered 2013-10-20: 650 mg via ORAL

## 2013-10-20 NOTE — Progress Notes (Signed)
Dr.  Marcy Panning requested a consultation for genetic counseling and risk assessment for Jasmine Schwartz, a 48 y.o. female, for discussion of her personal history of breast cancer and family history of brain cancer.  She presents to clinic today to discuss the possibility of a genetic predisposition to cancer, and to further clarify her risks, as well as her family members' risks for cancer.   HISTORY OF PRESENT ILLNESS: In 2013, at the age of 80, Jasmine Schwartz was diagnosed with invasive ductal carcinoma of the right breast. This was treated with double mastectomy and chemotherapy.  Her tumor is ER-/PR-/Her2+.  She is currently on Herceptin.  She has no information about her father's side of the family.    Past Medical History  Diagnosis Date  . Anxiety   . Depression   . Dyslipidemia   . Anxiety and depression 07/06/2011  . Hx of insertion of insulin pump ~ 2001 to present (06/09/2013)  . PONV (postoperative nausea and vomiting)   . Venous insufficiency   . Lower extremity edema   . Diabetes mellitus     IDDM  . Type I diabetes mellitus     uses insulin pump  . Breast cancer 01/01/13    /metastatic 1/1 lymph nodes    Past Surgical History  Procedure Laterality Date  . Lumbar disc surgery  2009    Dr Maxie Better  . Cesarean section  1989  . Tubal ligation  1991  . Breast biopsy Right 01/01/13    Invasive mammary ca,metastatic in 1/1 lymph node  . Breast biopsy Left 01/09/13    Fibroadenoma,microcalcifications  . Portacath placement Left 01/2013  . Mastectomy modified radical Right 06/09/2013  . Mastectomy complete / simple Left 06/09/2013  . Mastectomy modified radical Right 06/09/2013    Procedure: RIGHT MASTECTOMY MODIFIED RADICAL;  Surgeon: Adin Hector, MD;  Location: [facility];  Service: General;  Laterality: Right;  . Simple mastectomy with axillary sentinel node biopsy Left 06/09/2013    Procedure: LEFT SIMPLE MASTECTOMY;  Surgeon: Adin Hector, MD;  Location: [facility];  Service:  General;  Laterality: Left;    History   Social History  . Marital Status: Married    Spouse Name: N/A    Number of Children: 2  . Years of Education: N/A   Occupational History  .     Social History Main Topics  . Smoking status: Never Smoker   . Smokeless tobacco: Never Used  . Alcohol Use: No  . Drug Use: No  . Sexual Activity: Not Currently   Other Topics Concern  . None   Social History Narrative  . None    REPRODUCTIVE HISTORY AND PERSONAL RISK ASSESSMENT FACTORS: Menarche was at age 31.   postmenopausal Uterus Intact: yes Ovaries Intact: yes G1P2A0, first live birth at age 51  She has not previously undergone treatment for infertility.   Oral Contraceptive use: 5 years   She has not used HRT in the past.    FAMILY HISTORY:  We obtained a detailed, 4-generation family history.  Significant diagnoses are listed below: Family History  Problem Relation Age of Onset  . Cancer Neg Hx   . Diabetes Neg Hx   . Coronary artery disease Mother   . Congestive Heart Failure Maternal Grandmother   . Brain cancer Maternal Grandfather     dx in his early 62s  The patient's father left the family when she was a few months old.  She had no information about  her paternal side of the family.  Patient's ancestors are of Caucasian descent. There is no reported Ashkenazi Jewish ancestry. There is no known consanguinity.  GENETIC COUNSELING ASSESSMENT: Jasmine Schwartz is a 48 y.o. female with a personal history of breast cancer and family history of brain cancer which somewhat suggestive of a hereditary cancer syndrome and predisposition to cancer. We, therefore, discussed and recommended the following at today's visit.   DISCUSSION: We reviewed the characteristics, features and inheritance patterns of hereditary cancer syndromes. We also discussed genetic testing, including the appropriate family members to test, the process of testing, insurance coverage and turn-around-time for  results. We discussed hereditary cancer syndromes.  Based on her limited/unknown information about her father's side of the fmaily she appears to meet BCBS criteria for testing.  We discussed BRCA mutations vs panel testing. She is opting for panel testing.  PLAN: After considering the risks, benefits, and limitations, Jasmine Schwartz provided informed consent to pursue genetic testing and the blood sample will be sent to [facility] for analysis of the Breast/Ovarian Cancer syndrome. We discussed the implications of a positive, negative and/ or variant of uncertain significance genetic test result. Results should be available within approximately 3 weeks' time, at which point they will be disclosed by telephone to Jasmine Schwartz, as will any additional recommendations warranted by these results. Jasmine Schwartz will receive a summary of her genetic counseling visit and a copy of her results once available. This information will also be available in Epic. We encouraged Jasmine Schwartz to remain in contact with cancer genetics annually so that we can continuously update the family history and inform her of any changes in cancer genetics and testing that may be of benefit for her family. Jasmine Schwartz's questions were answered to her satisfaction today. Our contact information was provided should additional questions or concerns arise.  The patient was seen for a total of 30 minutes, greater than 50% of which was spent face-to-face counseling.  This note will also be sent to the referring provider via the electronic medical record. The patient will be supplied with a summary of this genetic counseling discussion as well as educational information on the discussed hereditary cancer syndromes following the conclusion of their visit.   Patient was discussed with Dr. Marcy Panning.   _______________________________________________________________________ For Office Staff:  Number of people involved in session: 2 Was an  Intern/ student involved with case: no

## 2013-10-20 NOTE — Progress Notes (Signed)
OFFICE PROGRESS NOTE  CC  [name], MD [address] [place] [address] Dr. Thea Silversmith  Dr. 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 At that time she had mammogram performed and it was negative. However her breast continue to increase in size. Initially patient felt that it could have been cysts or something on and therefore she did not go t o a physician. However subsequently patient's breast became quite painful and she also began to feel in knot under her right arm.because of this she went to her physician who did a physical exam and a mammogram was performed on 01/01/2013 the mammogram showed diffuse skin thickening generalized increased density within the right breast and enlarged right axillary lymph nodes. She went on to have an ultrasound of the right breast performed that showed a large spiculated hypoechoic mass in the right breast 11:00 2 cm from the nipple measuring at least 4.5 cm in size. There were abnormal lymph nodes with thickened cortex and the right axilla the largest one measuring 3.1 cm in dimension.  #2Patient was recommended ultrasound guided core biopsy of the right breast mass and the right axilla lymph node. The biopsy was performed on 01/01/2013. The right needle core biopsy of the primary mass showed invasive mammary carcinoma grade 2-3 was invasive ductal. The lymph node of the right axilla was positive for metastatic disease. The tumor was ER PR negative HER-2/neu positive with a Ki-67 of 76%. On 01/09/2013 patient had MRI of the breasts performed that showed diffuse right breast skin thickening and edema with right axillary lymph node. There was a 5 mm internal mammary lymph node that  was incompletely imaged. The right breast mass measured 8 x 5 x 4.7 cm. There were abnormal areas involving all 4 quadrants. There was however no involvement of the pectoralis minor for lower chest wall. The left breast showed 2 areas of nodular enhancement these were biopsied and were fibroadenomas  #3 patient is status post Neoadjuvant with curative intent Taxotere carboplatinum Herceptin and perjeta starting 01/16/2013 - 05/01/2013.  #5 patient is status post bilateral mastectomies with the left breast revealing no evidence of cancer. Right breast reveals no residual carcinoma 14 right axillary lymph nodes were negative for metastatic disease.  #6 patient began adjuvant Herceptin starting 06/16/2013. Patient will complete out 1 year of adjuvant Herceptin.  #7  Patient is s/p adjuvant radiation therapy from 07/27/2013-09/11/2013.   CURRENT THERAPY:  Proceed with cycle 7 of adjuvant Herceptin   INTERVAL HISTORY: Jasmine Schwartz 48 y.o. female returns for followup visit today.  She is feeling well.  Tolerating herceptin very well. She states she feels almost back to her normal self. Today she denies any headaches double vision blurring of vision fevers chills night sweats. No shortness of breath chest pains palpitations. No abdominal pain no diarrhea or constipation. She has no easy bruising or bleeding. She has no myalgias and arthralgias. No peripheral paresthesias or gait disturbances. Remainder of the 10 point review of systems is negative.  MEDICAL HISTORY: Past Medical History  Diagnosis Date  . Anxiety   . Depression   . Dyslipidemia   . Anxiety and depression 07/06/2011  . Hx of insertion of insulin pump ~ 2001  to present (06/09/2013)  . PONV (postoperative nausea and vomiting)   . Venous insufficiency   . Lower extremity edema   . Diabetes mellitus     IDDM  . Type I diabetes mellitus     uses insulin pump  . Breast cancer 01/01/13    /metastatic 1/1 lymph nodes    ALLERGIES:   has No Known Allergies.  MEDICATIONS:  Current Outpatient Prescriptions  Medication Sig Dispense Refill  . cyanocobalamin 500 MCG tablet Take 500 mcg by mouth daily.      Marland Kitchen glucose blood (NOVA MAX TEST) test strip Use as directed up to 8 times a day      . insulin aspart (NOVOLOG) 100 UNIT/ML injection Inject as directed into Insulin pump, total of 40 units/day  20 mL  11  . Insulin Infusion Pump (MINIMED INSULIN PUMP) DEVI Inject 1 Units/hr into the vein continuous. Pt give extra per cards at meal times. Basic meals are about 5 units      . Insulin Infusion Pump Supplies (MINIMED INFUSION SET-MMT 397) MISC 1 Device by Does not apply route every 3 (three) days.      . Insulin Infusion Pump Supplies (PARADIGM PUMP RESERVOIR 1.76ML) MISC 1 Device by Does not apply route every 3 (three) days.      Marland Kitchen LORazepam (ATIVAN) 0.5 MG tablet Take 1 tablet (0.5 mg total) by mouth every 8 (eight) hours as needed for anxiety.  30 tablet  2   No current facility-administered medications for this visit.    SURGICAL HISTORY:  Past Surgical History  Procedure Laterality Date  . Lumbar disc surgery  2009    Dr Maxie Better  . Cesarean section  1989  . Tubal ligation  1991  . Breast biopsy Right 01/01/13    Invasive mammary ca,metastatic in 1/1 lymph node  . Breast biopsy Left 01/09/13    Fibroadenoma,microcalcifications  . Portacath placement Left 01/2013  . Mastectomy modified radical Right 06/09/2013  . Mastectomy complete / simple Left 06/09/2013  . Mastectomy modified radical Right 06/09/2013    Procedure: RIGHT MASTECTOMY MODIFIED RADICAL;  Surgeon: Adin Hector, MD;  Location: [facility];  Service: General;  Laterality: Right;  . Simple mastectomy with axillary sentinel node biopsy Left 06/09/2013    Procedure: LEFT SIMPLE MASTECTOMY;  Surgeon: Adin Hector, MD;  Location: [facility];  Service: General;  Laterality: Left;    REVIEW OF SYSTEMS:  A 10 point review of systems was conducted and is otherwise  negative except for what is noted above.    PHYSICAL EXAMINATION: Blood pressure 118/77, pulse 84, temperature 98.4 F (36.9 C), temperature source Oral, resp. rate 18, height 5' 3" (1.6 m), weight 149 lb (67.586 kg). Body mass index is 26.4 kg/(m^2). GENERAL: Patient is a well appearing female in no acute distress HEENT:  Sclerae anicteric.  Oropharynx clear and moist. No ulcerations or evidence of oropharyngeal candidiasis. Neck is supple.  NODES:  No cervical, supraclavicular, or axillary lymphadenopathy palpated.  BREAST EXAM:  deferred LUNGS:  Clear to auscultation bilaterally.  No wheezes or rhonchi. HEART:  Regular rate and rhythm. No murmur appreciated. ABDOMEN:  Soft, nontender.  Positive, normoactive bowel sounds. No organomegaly palpated. MSK:  No focal spinal tenderness to palpation. Full range of motion bilaterally in the upper extremities. EXTREMITIES:  No peripheral edema.   SKIN:  Clear with no obvious rashes or skin changes. No nail dyscrasia. NEURO:  Nonfocal. Well oriented.  Appropriate affect. ECOG PERFORMANCE STATUS: 0 -  Asymptomatic  LABORATORY DATA: Lab Results  Component Value Date   WBC 6.5 10/20/2013   HGB 12.1 10/20/2013   HCT 36.1 10/20/2013   MCV 83.4 10/20/2013   PLT 309 10/20/2013      Chemistry      Component Value Date/Time   NA 138 10/20/2013 1341   NA 136 09/29/2013 1249   K 4.2 10/20/2013 1341   K 3.8 09/29/2013 1249   CL 102 09/29/2013 1249   CL 99 01/23/2013 1251   CO2 28 10/20/2013 1341   CO2 25 09/29/2013 1249   BUN 17.0 10/20/2013 1341   BUN 17 09/29/2013 1249   CREATININE 0.7 10/20/2013 1341   CREATININE 0.62 09/29/2013 1249      Component Value Date/Time   CALCIUM 9.5 10/20/2013 1341   CALCIUM 8.7 09/29/2013 1249   ALKPHOS 82 10/20/2013 1341   ALKPHOS 67 09/29/2013 1249   AST 30 10/20/2013 1341   AST 40* 09/29/2013 1249   ALT 31 10/20/2013 1341   ALT 44* 09/29/2013 1249   BILITOT 0.37 10/20/2013 1341   BILITOT 0.4 09/29/2013 1249     Pathology  06/09/2013:  Diagnosis 1. Breast, simple mastectomy, Left - FIBROCYSTIC CHANGE WITH ADENOSIS AND CALCIFICATIONS. - NO MALIGNANCY IDENTIFIED. 2. Breast, modified radical mastectomy , Right - BENIGN BREAST TISSUE WITH EXTENSIVE FIBROSIS CONSISTENT WITH TREATMENT EFFECT. - NO RESIDUAL CARCINOMA IDENTIFIED. - FOURTEEN LYMPH NODES NEGATIVE FOR CARCINOMA WITH FOCAL HYLANIZATION (0/14). - SEE ONCOLOGY TABLE AND COMMENT. Microscopic Comment 2. BREAST, INVASIVE TUMOR, WITH LYMPH NODE SAMPLING Specimen, including laterality and lymph node sampling (sentinel, non-sentinel): Right breast and axillary contents. Procedure: Right modified radical mastectomy. Histologic type, tumor grade, tumor size, margins, lympovascular invasion, tumor focality, and extent of tumor can not be assessed, as there is no residual carcinoma. Ductal carcinoma in situ: Absent. Lobular neoplasia: Absent. Treatment effect: Present, extensive. If present, treatment effect in breast tissue, lymph nodes or both: Both. Lymph nodes: Examined: 14 Total Lymph nodes with metastasis: 0 Isolated tumor cells (< 0.2 mm): 0 Micrometastasis: (> 0.2 mm and < 2.0 mm): 0 Macrometastasis: (> 2.0 mm): 0 Extracapsular extension: 0 Breast prognostic profile: [id number] Estrogen receptor: Negative. Progesterone receptor: Negative. Her 2 neu: Amplified. Ki-67: 76% Non-neoplastic breast: Fibrocystic change. 1 of 3 FINAL for MARKIE, HEFFERNAN [phone]) Microscopic Comment(continued) TNM: ypT0, ypN0 Comments: There is no residual tumor in the current breast specimen, status post neoadjuvant chemotherapy, and therefore it is best staged as ypT0. Many of the nodes exhibit fibrosis consistent with treatment effect. Immunohistochemistry for pancytokeratin was performed on the two largest lymph nodes and was negative for carcinoma, resulting in a stage of ypN0. Vicente Males MD Pathologist, Electronic Signature (Case signed  06/11/2013) Specimen Gross and Clinical  RADIOGRAPHIC STUDIES:  Ct Chest W Contrast  01/20/2013   *RADIOLOGY REPORT*  Clinical Data:  High risk breast cancer.  Evaluate for metastatic disease.  CT CHEST, ABDOMEN AND PELVIS WITH CONTRAST  Technique:  Multidetector CT imaging of the chest, abdomen and pelvis was performed following the standard protocol during bolus administration of intravenous contrast.  Contrast: 178m OMNIPAQUE IOHEXOL 300 MG/ML  SOLN  Comparison:  None  CT CHEST  Findings:  Lungs/pleura: There is no pleural effusion.  No suspicious pulmonary nodule or mass noted.  Heart/Mediastinum: Heart size is normal.  No pericardial effusion. No mediastinal or hilar adenopathy.  Bones/Musculoskeletal:  Asymmetric increased soft tissue within the right breast parenchyma corresponding to patient's biopsy-proven invasive breast carcinoma.  There is associated overlying skin  thickening.  Multiple enlarged right axillary lymph nodes are identified.  Index lymph node measures 1.7 cm, image 23/series 2. Several enhancing, sub centimeter retropectoral lymph nodes are identified.  IMPRESSION:  1.  No acute findings. 2.  Right breast invasive carcinoma with associated right axillary adenopathy.  CT ABDOMEN AND PELVIS  Findings:  There are no focal liver abnormalities identified.  The gallbladder appears normal.  There is no biliary dilatation.  The pancreas is unremarkable.  Normal appearance of the spleen.  The adrenal glands are both normal.  The kidneys are unremarkable. Urinary bladder appears normal.  The uterus is unremarkable. Corpus luteal cyst is noted in the left ovary measuring 1.6 cm.  No free fluid or fluid collections identified within the upper abdomen or pelvis.  The abdominal aorta has a normal caliber. There is no adenopathy identified within the upper abdomen.  No pelvic or inguinal adenopathy noted.  The stomach is normal.  The small bowel loops are unremarkable. Normal appearance of the  colon.  Review of the visualized osseous structures is negative for aggressive lytic or sclerotic bone lesion.  IMPRESSION:  1.  No acute findings within the abdomen or pelvis. 2.  No mass or adenopathy.   Original Report Authenticated By: Kerby Moors, M.D.   US Breast Left  01/09/2013   *RADIOLOGY REPORT*  Clinical Data:  48 year old female with newly diagnosed right breast cancer.  Left mammogram to compare to presurgical MRI.  DIGITAL DIAGNOSTIC LEFT MAMMOGRAM WITH CAD AND LEFT BREAST ULTRASOUND:  Comparison:  06/11/2012 mammogram and 01/09/2013 MRI  Findings:  ACR Breast Density Category 3: The breast tissue is heterogeneously dense.  A possible circumscribed mass in the upper outer left breast is identified. No suspicious calcifications or distortion identified.  Mammographic images were processed with CAD.  On physical exam, no palpable abnormalities identified within the upper left breast.  Ultrasound is performed, showing a 6 x 6 x 8 mm slightly irregular hypoechoic mass at the 11 o'clock position of the left breast 5 cm from the nipple An 11 x 7 x 9 mm slightly irregular hypoechoic mass at the 2 o'clock position of the left breast 4 cm from the nipple is also present. No enlarged or abnormal appearing left axillary lymph nodes are identified.  IMPRESSION: Indeterminate 6 x 6 x 8 mm hypoechoic mass at the 11 o'clock position of the left breast and 11 x 7 x 9 mm hypoechoic mass in the 2 o'clock position of the left breast.  The 11 o'clock position mass probably represents the enhancing nodule identified on today's MRI.  Tissue sampling of both masses is recommended.  BI-RADS CATEGORY 4:  Suspicious abnormality - biopsy should be considered.  RECOMMENDATION: Ultrasound guided biopsies of both left breast masses.  The findings and recommendations were discussed with the patient and she desires to proceed with ultrasound guided left breast biopsies. These biopsies will be performed today but dictated in a  separate report.  Results were also provided in writing at the conclusion of the visit.  If applicable, a reminder letter will be sent to the patient regarding the next appointment.   Original Report Authenticated By: Margarette Canada, M.D.   Ct Abdomen Pelvis W Contrast  01/22/2013   *RADIOLOGY REPORT*  Clinical Data:  High risk breast cancer.  Evaluate for metastatic disease.  CT CHEST, ABDOMEN AND PELVIS WITH CONTRAST  Technique:  Multidetector CT imaging of the chest, abdomen and pelvis was performed following the standard protocol during bolus administration  of intravenous contrast.  Contrast: 121m OMNIPAQUE IOHEXOL 300 MG/ML  SOLN  Comparison:  None  CT CHEST  Findings:  Lungs/pleura: There is no pleural effusion.  No suspicious pulmonary nodule or mass noted.  Heart/Mediastinum: Heart size is normal.  No pericardial effusion. No mediastinal or hilar adenopathy.  Bones/Musculoskeletal:  Asymmetric increased soft tissue within the right breast parenchyma corresponding to patient's biopsy-proven invasive breast carcinoma.  There is associated overlying skin thickening.  Multiple enlarged right axillary lymph nodes are identified.  Index lymph node measures 1.7 cm, image 23/series 2. Several enhancing, sub centimeter retropectoral lymph nodes are identified.  IMPRESSION:  1.  No acute findings. 2.  Right breast invasive carcinoma with associated right axillary adenopathy.  CT ABDOMEN AND PELVIS  Findings:  There are no focal liver abnormalities identified.  The gallbladder appears normal.  There is no biliary dilatation.  The pancreas is unremarkable.  Normal appearance of the spleen.  The adrenal glands are both normal.  The kidneys are unremarkable. Urinary bladder appears normal.  The uterus is unremarkable. Corpus luteal cyst is noted in the left ovary measuring 1.6 cm.  No free fluid or fluid collections identified within the upper abdomen or pelvis.  The abdominal aorta has a normal caliber. There is no  adenopathy identified within the upper abdomen.  No pelvic or inguinal adenopathy noted.  The stomach is normal.  The small bowel loops are unremarkable. Normal appearance of the colon.  Review of the visualized osseous structures is negative for aggressive lytic or sclerotic bone lesion.  IMPRESSION:  1.  No acute findings within the abdomen or pelvis. 2.  No mass or adenopathy.   Original Report Authenticated By: TKerby Moors M.D.   Mr Breast Bilateral W Wo Contrast  01/09/2013   *RADIOLOGY REPORT*  Clinical Data: Biopsy-proven right breast inflammatory carcinoma with primary invasive mammary carcinoma and previous biopsy and known right axillary metastatic disease.  Preoperative staging MRI.  BUN and creatinine were obtained on site at [facility] [address] Results:  BUN 10 mg/dL,  Creatinine 0.9 mg/dL.  BILATERAL BREAST MRI WITH AND WITHOUT CONTRAST  Technique: Multiplanar, multisequence MR images of both breasts were obtained prior to and following the intravenous administration of 120mof Multihance.  Three dimensional images were evaluated at the independent DynaCad workstation.  Comparison:  Prior mammograms and ultrasound.  The patient underwent diagnostic mammography and ultrasound of the left breast today as no recent prior exam was available after the recent diagnosis in the right breast.  Findings: There is diffuse right breast skin thickening and edema, trabecular edema, and right axillary lymphadenopathy with maximal nodal measurement 3.1 cm short-axis diameter.  There is also a 5 mm internal mammary artery chain lymph node identified image 26 series 2. There is an incompletely visualized lymph node subjacent to the pectoralis minor muscle on the first image of the T2-weighted series.  There is a large irregular enhancing mass in the right breast 11 o'clock location with associated clip artifact measuring maximally 8.0 x 5.6 by 4.7 cm. This demonstrates washin/washout type  enhancement kinetics.  This corresponds to the biopsy-proven invasive mammary carcinoma.  There are abnormal nodular areas of trabecular has been throughout the right breast involving all four quadrants. No abnormal underlying pectoralis enhancement or chest wall involvement is identified.  In the left breast 11 o'clock location, there is an 8 mm enhancing nodule corresponding to that seen at the examination earlier today. This area demonstrates plateau type  enhancement kinetics.  No left- sided axillary or internal mammary artery chain lymphadenopathy is identified.  There is mild inhomogeneous nodular enhancement in the left breast two o'clock location at the site of the mass seen in this location earlier today, measuring 9 mm, measuring progressive enhancement kinetics.  IMPRESSION: Large irregular enhancing right breast mass 11 o'clock location corresponding to biopsy-proven invasive mammary carcinoma, with metastatic right axillary lymphadenopathy as well as skin and trabecular thickening/enhancement compatible with the clinical diagnosis of inflammatory and/or locally advanced breast cancer.  5 mm abnormal right internal mammary artery chain lymph node.  8 mm enhancing mass left breast 11 o'clock location and mildly irregular area of mass-like enhancement in the left breast two o'clock location, corresponding to those seen at diagnostic examination earlier today.  Biopsy has been scheduled for today and will be dictated separately.  RECOMMENDATION: Treatment plan  THREE-DIMENSIONAL MR IMAGE RENDERING ON INDEPENDENT WORKSTATION:  Three-dimensional MR images were rendered by post-processing of the original MR data on an independent workstation.  The three- dimensional MR images were interpreted, and findings were reported in the accompanying complete MRI report for this study.  BI-RADS CATEGORY 6:  Known biopsy-proven malignancy - appropriate action should be taken.   Original Report Authenticated By: Conchita Paris, M.D.   Nm Pet Image Initial (pi) Skull Base To Thigh  01/20/2013   *RADIOLOGY REPORT*  Clinical Data: Initial treatment strategy for breast cancer.  NUCLEAR MEDICINE PET SKULL BASE TO THIGH  Fasting Blood Glucose:  263  Technique:  19.1 mCi F-18 FDG was injected intravenously. CT data was obtained and used for attenuation correction and anatomic localization only.  (This was not acquired as a diagnostic CT examination.) Additional exam technical data entered on technologist worksheet.  Comparison:  None  Findings:  Neck: No hypermetabolic lymph nodes in the neck.  Chest:  No hypermetabolic mediastinal or hilar nodes.  No suspicious pulmonary nodules on the CT scan. Enlarged right axillary lymph nodes identified.  Index lymph node measures 1.7 cm and has an SUV max equal to 2.8, image 77.  There is asymmetric increased uptake throughout the right breast with associated overlying skin thickening.  This has an SUV max equal to 4.4, image 101.  Abdomen/Pelvis:  No abnormal hypermetabolic activity within the liver, pancreas, adrenal glands, or spleen.  No hypermetabolic lymph nodes in the abdomen or pelvis. There is a small focus of increased FDG uptake within the left ovary.  This has an SUV max equal to 13.1, image 217.  Skeleton:  No focal hypermetabolic activity to suggest skeletal metastasis.  IMPRESSION:  1.  Asymmetric increased uptake associated with the right breast and right breast skin thickening corresponding to biopsy-proven invasive breast cancer. 2.  Enlarged and hypermetabolic right axillary lymph nodes consistent with metastatic adenopathy. 3.  No evidence for distant metastatic disease. 4.  Focal area of increased uptake within the left axilla.  The patient is noted to have a corpus luteal cyst are not diagnostic CT images.  This activity is favored to represent physiologic activity.  Consider confirmatory imaging with pelvic sonogram.   Original Report Authenticated By: Kerby Moors, M.D.    Ir Fluoro Guide Cv Line Left  01/15/2013   *RADIOLOGY REPORT*  Clinical Data/Indication: Breast cancer  TUNNEL POWER PORT PLACEMENT WITH SUBCUTANEOUS POCKET UTILIZING ULTRASOUND & FLOUROSCOPY  Sedation: Versed 5.0 mg, Fentanyl 250 mcg.  Total Moderate Sedation Time: 35 minutes.  As antibiotic prophylaxis, Ancef  was ordered pre-procedure and administered intravenously within one  hour of incision.  Fluoroscopy Time: 36 seconds.  Procedure:  After written informed consent was obtained, patient was placed in the supine position on angiographic table. The left neck and chest was prepped and draped in a sterile fashion. Lidocaine was utilized for local anesthesia.  The left internal jugular vein was noted to be patent initially with ultrasound. Under sonographic guidance, a micropuncture needle was inserted into the left IJ vein (Ultrasound and fluoroscopic image documentation was performed). The needle was removed over an 018 wire which was exchanged for a Amplatz.  This was advanced into the IVC.  An 8-French dilator was advanced over the Amplatz.  A small incision was made in the left upper chest over the anterior left second rib.  Utilizing blunt dissection, a subcutaneous pocket was created in the caudal direction. The pocket was irrigated with a copious amount of sterile normal saline.  The port catheter was tunneled from the chest incision, and out the neck incision.  The reservoir was inserted into the subcutaneous pocket and secured with two 3-0 Ethilon stitches.  A peel-away sheath was advanced over the Amplatz wire.  The port catheter was cut to measure length and inserted through the peel-away sheath.  The peel-away sheath was removed.  The chest incision was closed with 3-0 Vicryl interrupted stitches for the subcutaneous tissue and a running of 4- 0 Vicryl subcuticular stitch for the skin.  The neck incision was closed with a 4-0 Vicryl subcuticular stitch.  Derma-bond was applied to both surgical  incisions.  The port reservoir was flushed and instilled with heparinized saline.  No complications.  Findings:  A left IJ vein Port-A-Cath is in place with its tip at the cavoatrial junction.  IMPRESSION: Successful 8 French left internal jugular vein power port placement with its tip at the SVC/RA junction.   Original Report Authenticated By: Marybelle Killings, M.D.   Ir US Guide Vasc Access Left  01/15/2013   *RADIOLOGY REPORT*  Clinical Data/Indication: Breast cancer  TUNNEL POWER PORT PLACEMENT WITH SUBCUTANEOUS POCKET UTILIZING ULTRASOUND & FLOUROSCOPY  Sedation: Versed 5.0 mg, Fentanyl 250 mcg.  Total Moderate Sedation Time: 35 minutes.  As antibiotic prophylaxis, Ancef  was ordered pre-procedure and administered intravenously within one hour of incision.  Fluoroscopy Time: 36 seconds.  Procedure:  After written informed consent was obtained, patient was placed in the supine position on angiographic table. The left neck and chest was prepped and draped in a sterile fashion. Lidocaine was utilized for local anesthesia.  The left internal jugular vein was noted to be patent initially with ultrasound. Under sonographic guidance, a micropuncture needle was inserted into the left IJ vein (Ultrasound and fluoroscopic image documentation was performed). The needle was removed over an 018 wire which was exchanged for a Amplatz.  This was advanced into the IVC.  An 8-French dilator was advanced over the Amplatz.  A small incision was made in the left upper chest over the anterior left second rib.  Utilizing blunt dissection, a subcutaneous pocket was created in the caudal direction. The pocket was irrigated with a copious amount of sterile normal saline.  The port catheter was tunneled from the chest incision, and out the neck incision.  The reservoir was inserted into the subcutaneous pocket and secured with two 3-0 Ethilon stitches.  A peel-away sheath was advanced over the Amplatz wire.  The port catheter was cut to  measure length and inserted through the peel-away sheath.  The peel-away sheath was removed.  The chest  incision was closed with 3-0 Vicryl interrupted stitches for the subcutaneous tissue and a running of 4- 0 Vicryl subcuticular stitch for the skin.  The neck incision was closed with a 4-0 Vicryl subcuticular stitch.  Derma-bond was applied to both surgical incisions.  The port reservoir was flushed and instilled with heparinized saline.  No complications.  Findings:  A left IJ vein Port-A-Cath is in place with its tip at the cavoatrial junction.  IMPRESSION: Successful 8 French left internal jugular vein power port placement with its tip at the SVC/RA junction.   Original Report Authenticated By: Marybelle Killings, M.D.   Mm Digital Diagnostic Unilat L  01/09/2013   *RADIOLOGY REPORT*  Clinical Data:  Evaluate clip placement following two separate ultrasound-guided left breast biopsies.  DIGITAL DIAGNOSTIC LEFT MAMMOGRAM  Comparison:  Previous exams  Findings:  Films are performed following two separate ultrasound guided biopsies of the left breast - at the 11 o'clock position and 2 o'clock position. The ribbon shaped biopsy clip corresponds to the 11 o'clock mass and the T shaped biopsy clip corresponds to the 2 o'clock mass. No immediate complications are identified.  IMPRESSION: Satisfactory clip placement following ultrasound guided left breast biopsies.  Pathology will be followed.   Original Report Authenticated By: Margarette Canada, M.D.   Mm Digital Diagnostic Unilat L  01/09/2013   *RADIOLOGY REPORT*  Clinical Data:  48 year old female with newly diagnosed right breast cancer.  Left mammogram to compare to presurgical MRI.  DIGITAL DIAGNOSTIC LEFT MAMMOGRAM WITH CAD AND LEFT BREAST ULTRASOUND:  Comparison:  06/11/2012 mammogram and 01/09/2013 MRI  Findings:  ACR Breast Density Category 3: The breast tissue is heterogeneously dense.  A possible circumscribed mass in the upper outer left breast is identified. No  suspicious calcifications or distortion identified.  Mammographic images were processed with CAD.  On physical exam, no palpable abnormalities identified within the upper left breast.  Ultrasound is performed, showing a 6 x 6 x 8 mm slightly irregular hypoechoic mass at the 11 o'clock position of the left breast 5 cm from the nipple An 11 x 7 x 9 mm slightly irregular hypoechoic mass at the 2 o'clock position of the left breast 4 cm from the nipple is also present. No enlarged or abnormal appearing left axillary lymph nodes are identified.  IMPRESSION: Indeterminate 6 x 6 x 8 mm hypoechoic mass at the 11 o'clock position of the left breast and 11 x 7 x 9 mm hypoechoic mass in the 2 o'clock position of the left breast.  The 11 o'clock position mass probably represents the enhancing nodule identified on today's MRI.  Tissue sampling of both masses is recommended.  BI-RADS CATEGORY 4:  Suspicious abnormality - biopsy should be considered.  RECOMMENDATION: Ultrasound guided biopsies of both left breast masses.  The findings and recommendations were discussed with the patient and she desires to proceed with ultrasound guided left breast biopsies. These biopsies will be performed today but dictated in a separate report.  Results were also provided in writing at the conclusion of the visit.  If applicable, a reminder letter will be sent to the patient regarding the next appointment.   Original Report Authenticated By: Margarette Canada, M.D.   [place] Lt Breast Bx W Loc Dev 1st Lesion Img Bx Spec US Guide  01/12/2013   *RADIOLOGY REPORT*  Clinical Data:  48 year old female with newly diagnosed right breast cancer.  Two left breast masses identified sonographically - 6 x 8 mm at the 11 o'clock position  and 9 x 11 mm at the 2 o'clock position. For tissue sampling of the 6 x 8 mm mass at the 11 o'clock position.  ULTRASOUND GUIDED VACUUM ASSISTED CORE BIOPSY OF THE LEFT BREAST  Comparison: Previous exams.  I met with the patient and we  discussed the procedure of ultrasound- guided biopsy, including benefits and alternatives.  We discussed the high likelihood of a successful procedure. We discussed the risks of the procedure including infection, bleeding, tissue injury, clip migration, and inadequate sampling.  Informed written consent was given.  Using sterile technique and 2% Lidocaine as local anesthetic, under direct ultrasound visualization, a12 gauge vacuum-assisteddevice was used to perform biopsy of the 6 x 8 mm hypoechoic mass at the 11 o'clock position of the left breast 5 cm from the nipple using a medial approach. At the conclusion of the procedure, a ribbon shaped tissue marker clip was deployed into the biopsy cavity. Follow-up 2-view mammogram was performed and dictated separately.  The usual time-out protocol was performed immediately prior to the procedure.  IMPRESSION: Ultrasound-guided biopsy of 6 x 8 mm hypoechoic mass at the 11 o'clock position of the left breast.  No apparent complications.  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  Histology correlates with imaging findings.  The patient was contacted by phone on 01/12/2013 and these results given to her which she understood. Her questions were answered. The patient had no complaints with her biopsy site.  Recommend treatment plan.   Original Report Authenticated By: Margarette Canada, M.D.   [place] Lt Breast Bx W Loc Dev Ea Add Lesion Img Bx Spec US Guide  01/12/2013   *RADIOLOGY REPORT*  Clinical Data:  48 year old female with newly diagnosed right breast cancer.  Two left breast masses identified sonographically - 6 x 8 mm at the 11 o'clock position and 9 x 11 mm at the 2 o'clock position. For tissue sampling of the 9 x 11 mm mass at the 2 o'clock position.  ULTRASOUND GUIDED VACUUM ASSISTED CORE BIOPSY OF THE LEFT BREAST  Comparison: Previous exams.  I met with the patient and we discussed the procedure of ultrasound-  guided biopsy, including benefits and alternatives.  We discussed the high likelihood of a successful procedure. We discussed the risks of the procedure including infection, bleeding, tissue injury, clip migration, and inadequate sampling.  Informed written consent was given.  Using sterile technique and 2% Lidocaine as local anesthetic, under direct ultrasound visualization, a 12 gauge vacuum-assisteddevice was used to perform biopsy of the 9 x 11 mm hypoechoic mass at the 2 o'clock position of the left breast 4 cm from the nipple using a medial approach. At the conclusion of the procedure, a  tissue marker clip was deployed into the biopsy cavity.  Follow-up 2-view mammogram was performed and dictated separately.  The usual time-out protocol was performed immediately prior to the procedure.  IMPRESSION: Ultrasound-guided biopsy of 9 x 11 mm mass in the 2 o'clock position of the left breast.  No apparent complications.  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 Histology correlates with imaging findings.  The patient was contacted by phone on 01/12/2013 and these results given to her which she understood. Her questions were answered. The patient had no complaints with her biopsy site.  Recommend treatment plan.   Original Report Authenticated By: Margarette Canada, M.D.    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  Patient was administered adjuvant chemotherapy consisting of Taxotere carboplatinum Herceptin and perjeta x6 cycles from 01/16/2013 through 05/01/2013. Overall she tolerated it well.  #2  status post bilateral mastectomies on 06/09/2013 with the final pathology revealing no evidence of residual carcinoma in the right breast 14 lymph nodes were negative for metastatic disease. With the final pathologic staging at  T0 N0 with complete pathologic response to chemotherapy.  #3 Receiving adjuvant Herceptin every 3 weeks to complete one year. She is followed by cardiology as well for ongoing echocardiograms and evaluation for Herceptin induced cardio toxicity.  Her last appointment was on 09/08/13 and she was recommended to proceed with herceptin with 3 month follow up.    #4 patient will not receive antiestrogen therapy since her tumor is estrogen receptor and progesterone receptor negative  #5 She has underwent adjuvant radiation at [facility] from 07/27/2013-09/11/2013.  PLAN:   #1  Patient is doing well today.  She will proceed with Herceptin cycle 7, today 10/20/13    #2 we discussed breast cancer survivorship. She is encouraged to eat healthy, exercise and maintain a good BMI  #3  She will return in 3 weeks for labs, eval and Herceptin therapy.    All questions were answered. The patient knows to call the clinic with any problems, questions or concerns. We can certainly see the patient much sooner if necessary.  I spent 25 minutes counseling the patient face to face. The total time spent in the appointment was 30 minutes.   Marcy Panning, MD Medical/Oncology [facility] [phone] (beeper) [phone] (Office)  10/20/2013, 3:04 PM

## 2013-10-20 NOTE — Patient Instructions (Signed)
Breast Cancer Survivor Follow-Up Breast cancer begins when cells in the breast divide too rapidly. The extra cells form a lump (tumor). When the cancer is treated, the goal is to get rid of all cancer cells. However, sometimes a few cells survive. These cancer cells can then grow. They become recurrent cancer. This means the cancer comes back after treatment.  Most cases of recurrent breast cancer develop 3 to 5 years after treatment. However, sometimes it comes back just a few months after treatment. Other times, it does not come back until years later. If the cancer comes back in the same area as the first breast cancer, it is called a local recurrence. If the cancer comes back somewhere else in the body, it is called regional recurrence if the site is fairly near the breast or distant recurrence if it is far from the breast. Your caregiver may also use the term metastasize to indicate a cancer that has gone to another part of your body. Treatment is still possible after either kind of recurrence. The cancer can still be controlled.  CAUSES OF RECURRENT CANCER No one knows exactly why breast cancer starts in the first place. Why the cancer comes back after treatment is also not clear. It is known that certain conditions, called risk factors, can make this more likely. They include:  Developing breast cancer for the first time before age 60.  Having breast cancer that involves the lymph nodes. These are small, round pieces of tissue found all over the body. Their job is to help fight infections.  Having a large tumor. Cancer is more apt to come back if the first tumor was bigger than 2 inches (5 cm).  Having certain types of breast cancer, such as:  Inflammatory breast cancer. This rare type grows rapidly and causes the breast to become red and swollen.  A high-grade tumor. The grade of a tumor indicates how fast it will grow and spread. High-grade tumors grow more quickly than other types.  HER2  cancer. This refers to the tumor's genetic makeup. Tumors that have this type of gene are more likely to come back after treatment.  Having close tumor margins. This refers to the space between the tumor and normal, noncancerous cells. If the space is small, the tumor has a greater chance of coming back.  Having treatment involving a surgery to remove the tumor but not the entire breast (lumpectomy) and no radiation therapy. CARE AFTER BREAST CANCER Home Monitoring Women who have had breast cancer should continue to examine their breasts every month. The goal is to catch the cancer quickly if it comes back. Many women find it helpful to do so on the same day each month and to mark the calendar as a reminder. Let your caregiver know immediately if you have any signs of recurrent breast cancer. Symptoms will vary, depending on where the cancer recurs. The original type of treatment can also make a difference. Symptoms of local recurrence after a lumpectomy or a recurrence in the opposite breast may include:  A new lump or thickening in the breast.  A change in the way the skin looks on the breast (such as a rash, dimpling, or wrinkling).  Redness or swelling of the breast.  Changes in the nipple (such as being red, puckered, swollen, or leaking fluid). Symptoms of a recurrence after a breast removal surgery (mastectomy) may include:  A lump or thickening under the skin.  A thickening around the mastectomy scar. Symptoms   of regional recurrence in the lymph nodes near the breast may include:  A lump under the arm or above the collarbone.  Swelling of the arm.  Pain in the arm, shoulder, or chest.  Numbness in the hand or arm. Symptoms of distant recurrence may include:  A cough that does not go away.  Trouble breathing or shortness of breath.  Pain in the bones or the chest. This is pain that lasts or does not respond to rest and medicine.  Headaches.  Sudden vision  problems.  Dizziness.  Nausea or vomiting.  Losing weight without trying to.  Persistent abdominal pain.  Changes in bowel movements or blood in the stool.  Yellowing of the skin or eyes (jaundice).  Blood in the urine or bloody vaginal discharge. Clinical Monitoring  It is helpful to keep a schedule of appointments for needed tests and exams. This includes physical exams, breast exams, exams of the lymph nodes, and general exams.  For the first 3 years after being treated for breast cancer, see your caregiver every 3 to 6 months.  For years 4 and 5 after breast cancer, see your caregiver every 6 to 12 months.  After 5 years, see your caregiver at least once a year.  Regular breast X-rays (mammograms) should continue even if you had a mastectomy.  A mammogram should be done 1 year after the mammogram that first detected breast cancer.  A mammogram should be done every 6 to 12 months after that. Follow your caregiver's advice.  A pelvic exam done by your caregiver checks whether female organs are the normal size and shape. The exam is usually done every year. Ask your caregiver if that schedule is right for you.  Women taking tamoxifen should report any vaginal bleeding immediately to their caregiver. Tamoxifen is often given to women with a certain type of breast cancer. It has been shown to help prevent recurrence.  You will need to decide who your primary caregiver will be.  Most people continue to see their cancer specialist (oncologist) every 3 to 6 months for the first year after cancer treatment.  At some point, you may want to go back to seeing your family caregiver. You would no longer see your oncologist for regular checkups. Many women do this about 1 year after their first diagnosis of breast cancer.  You will still need to be seen every so often by your oncologist. Ask how often that should be. Coordinate this with your family or primary caregiver.  Think about  having genetic counseling. This would provide information on traits that can be passed or inherited from one generation to the next. In some cases, breast cancer runs in families. Tell your caregiver if you:  Are of Ashkenazi Jewish heritage.  Have any family member who has had ovarian cancer.  Have a mother, sister, or daughter who had breast cancer before age 7.  Have 2 or more close relatives who have had breast cancer. This means a mother, sister, daughter, aunt, or grandmother.  Had breast cancer in both breasts.  Have a female relative who has had breast cancer.  Some tests are not recommended for routine screening. Someone recovering from breast cancer does not need to have these tests if there are no problems. The tests have risks, such as radiation exposure, and can be costly. The risks of these tests are thought to be greater than the benefits:  Blood tests.  Chest X-rays.  Bone scans.  Liver ultrasound.  Computed tomography (CT scan).  Positron emission tomography (PET scan).  Magnetic resonance imaging (MRI scan). DIAGNOSIS OF RECURRENT CANCER Recurrent breast cancer may be suspected for various reasons. A mammogram may not look normal. You might feel a lump or have other symptoms. Your caregiver may find something unusual during an exam. To be sure, your caregiver will probably order some tests. The tests are needed because there are symptoms or hints of a problem. They could include:  Blood tests, including a test to check how well the liver is working. The liver is a common site for a distant cancer recurrence.  Imaging tests that create pictures of the inside of the body. These tests include:  Chest X-rays to show if the cancer has come back in the lungs.  CT scans to create detailed pictures of various areas of the body and help find a distant recurrence.  MRI scans to find anything unusual in the breast, chest, or lymph nodes.  Breast ultrasound tests to  examine the breasts.  Bone scans to create a picture of your whole skeleton and find cancer in bony areas.  PET scans to create an image of the whole body. PET scans can be used together with CT scans to show more detail.  Biopsy. A small sample of tissue is taken and checked under a microscope. If cancer cells are found, they may be tested to see if they contain the HER2 gene or the hormones estrogen and progesterone. This will help your caregiver decide how to treat the recurrent cancer. TREATMENT  How recurrent breast cancer is treated depends on where the new cancer is found. The type of treatment that was used for the first breast cancer makes a difference, too. A combination of treatments may be used. Options include:  Surgery.  If the cancer comes back in the breast that was not treated before, you may need a lumpectomy or mastectomy.  If the cancer comes back in the breast that was treated before, you may need a mastectomy.  The lymph nodes under the arm may need to be removed.  Radiation therapy.  For a local recurrence, radiation may be used if it was not used during the first treatment.  For a distance recurrence, radiation is sometimes used.  Chemotherapy.  This may be used before surgery to treat recurrent breast cancer.  This may be used to treat recurrent cancer that cannot be treated with surgery.  This may be used to treat a distant recurrence.  Hormone therapy.  Women with the HER2 gene may be given hormone therapy to attack this gene. Document Released: 03/28/2011 Document Revised: 10/22/2011 Document Reviewed: 03/28/2011 [employer] Patient Information 2014 [employer].  

## 2013-10-20 NOTE — Patient Instructions (Signed)
[facility] Discharge Instructions for Patients Receiving Chemotherapy  Today you received Herceptin.   If you develop nausea and vomiting that is not controlled by your nausea medication, call the clinic.   BELOW ARE SYMPTOMS THAT SHOULD BE REPORTED IMMEDIATELY:  *FEVER GREATER THAN 100.5 F  *CHILLS WITH OR WITHOUT FEVER  NAUSEA AND VOMITING THAT IS NOT CONTROLLED WITH YOUR NAUSEA MEDICATION  *UNUSUAL SHORTNESS OF BREATH  *UNUSUAL BRUISING OR BLEEDING  TENDERNESS IN MOUTH AND THROAT WITH OR WITHOUT PRESENCE OF ULCERS  *URINARY PROBLEMS  *BOWEL PROBLEMS  UNUSUAL RASH Items with * indicate a potential emergency and should be followed up as soon as possible.  Feel free to call the clinic you have any questions or concerns. The clinic phone number is [phone].    

## 2013-11-02 ENCOUNTER — Telehealth: Payer: Self-pay

## 2013-11-02 NOTE — Telephone Encounter (Signed)
Pt has cold/allergies.  Advised ok to take cold meds while taking herceptin.  Pt voiced understanding.

## 2013-11-09 ENCOUNTER — Telehealth: Payer: Self-pay | Admitting: Genetic Counselor

## 2013-11-09 NOTE — Telephone Encounter (Signed)
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  Discussed that CHEK2 is a moderate risk gene that increases the risk for breast cancer.  The patient had a double mastectomy, therefore she has reduced her risk to the greatest extent possible for breast cancer.  There is an increased risk for colon cancer and prostate cancer in CHEK2 carriers.  THerefore, while there are not specific guidelines for colon cancer screening for Chek2 carriers, we would recommend talking with a GI specialist and follow up accordingly.  We will have Arsenio Loader schedule her for genetic counseling with Ofri or Cat about CHEK2, as Angie is not able to be seen this week.

## 2013-11-10 ENCOUNTER — Telehealth: Payer: Self-pay | Admitting: Adult Health

## 2013-11-10 ENCOUNTER — Encounter: Payer: Self-pay | Admitting: Gastroenterology

## 2013-11-10 ENCOUNTER — Other Ambulatory Visit (HOSPITAL_BASED_OUTPATIENT_CLINIC_OR_DEPARTMENT_OTHER): Payer: BC Managed Care – PPO

## 2013-11-10 ENCOUNTER — Ambulatory Visit: Payer: BC Managed Care – PPO

## 2013-11-10 ENCOUNTER — Ambulatory Visit (HOSPITAL_BASED_OUTPATIENT_CLINIC_OR_DEPARTMENT_OTHER): Payer: BC Managed Care – PPO | Admitting: Oncology

## 2013-11-10 ENCOUNTER — Encounter: Payer: Self-pay | Admitting: Oncology

## 2013-11-10 VITALS — BP 123/76 | HR 89 | Temp 98.2°F | Resp 18 | Ht 63.0 in | Wt 150.3 lb

## 2013-11-10 DIAGNOSIS — Z171 Estrogen receptor negative status [ER-]: Secondary | ICD-10-CM

## 2013-11-10 DIAGNOSIS — Z5112 Encounter for antineoplastic immunotherapy: Secondary | ICD-10-CM

## 2013-11-10 DIAGNOSIS — C50911 Malignant neoplasm of unspecified site of right female breast: Secondary | ICD-10-CM

## 2013-11-10 DIAGNOSIS — C50419 Malignant neoplasm of upper-outer quadrant of unspecified female breast: Secondary | ICD-10-CM

## 2013-11-10 DIAGNOSIS — C773 Secondary and unspecified malignant neoplasm of axilla and upper limb lymph nodes: Secondary | ICD-10-CM

## 2013-11-10 LAB — CBC WITH DIFFERENTIAL/PLATELET
BASO%: 0.3 % (ref 0.0–2.0)
BASOS ABS: 0 10*3/uL (ref 0.0–0.1)
EOS ABS: 0.2 10*3/uL (ref 0.0–0.5)
EOS%: 1.9 % (ref 0.0–7.0)
HEMATOCRIT: 37.3 % (ref 34.8–46.6)
HEMOGLOBIN: 12.6 g/dL (ref 11.6–15.9)
LYMPH#: 1.5 10*3/uL (ref 0.9–3.3)
LYMPH%: 19.7 % (ref 14.0–49.7)
MCH: 28.2 pg (ref 25.1–34.0)
MCHC: 33.7 g/dL (ref 31.5–36.0)
MCV: 83.6 fL (ref 79.5–101.0)
MONO#: 0.3 10*3/uL (ref 0.1–0.9)
MONO%: 4.1 % (ref 0.0–14.0)
NEUT%: 74 % (ref 38.4–76.8)
NEUTROS ABS: 5.8 10*3/uL (ref 1.5–6.5)
Platelets: 310 10*3/uL (ref 145–400)
RBC: 4.46 10*6/uL (ref 3.70–5.45)
RDW: 13.9 % (ref 11.2–14.5)
WBC: 7.9 10*3/uL (ref 3.9–10.3)

## 2013-11-10 LAB — COMPREHENSIVE METABOLIC PANEL (CC13)
ALBUMIN: 3.6 g/dL (ref 3.5–5.0)
ALT: 10 U/L (ref 0–55)
ANION GAP: 9 meq/L (ref 3–11)
AST: 16 U/L (ref 5–34)
Alkaline Phosphatase: 97 U/L (ref 40–150)
BILIRUBIN TOTAL: 0.46 mg/dL (ref 0.20–1.20)
BUN: 17.6 mg/dL (ref 7.0–26.0)
CO2: 27 mEq/L (ref 22–29)
Calcium: 9.3 mg/dL (ref 8.4–10.4)
Chloride: 103 mEq/L (ref 98–109)
Creatinine: 0.8 mg/dL (ref 0.6–1.1)
GLUCOSE: 166 mg/dL — AB (ref 70–140)
Potassium: 3.8 mEq/L (ref 3.5–5.1)
SODIUM: 139 meq/L (ref 136–145)
Total Protein: 7.2 g/dL (ref 6.4–8.3)

## 2013-11-10 MED ORDER — HEPARIN SOD (PORK) LOCK FLUSH 100 UNIT/ML IV SOLN
500.0000 [IU] | Freq: Once | INTRAVENOUS | Status: AC | PRN
  Administered 2013-11-10: 500 [IU]
  Filled 2013-11-10: qty 5

## 2013-11-10 MED ORDER — ACETAMINOPHEN 325 MG PO TABS
ORAL_TABLET | ORAL | Status: AC
  Filled 2013-11-10: qty 2

## 2013-11-10 MED ORDER — TRASTUZUMAB CHEMO INJECTION 440 MG
6.0000 mg/kg | Freq: Once | INTRAVENOUS | Status: AC
  Administered 2013-11-10: 399 mg via INTRAVENOUS
  Filled 2013-11-10: qty 19

## 2013-11-10 MED ORDER — ACETAMINOPHEN 325 MG PO TABS
650.0000 mg | ORAL_TABLET | Freq: Once | ORAL | Status: AC
  Administered 2013-11-10: 650 mg via ORAL

## 2013-11-10 MED ORDER — DIPHENHYDRAMINE HCL 25 MG PO CAPS
50.0000 mg | ORAL_CAPSULE | Freq: Once | ORAL | Status: AC
  Administered 2013-11-10: 50 mg via ORAL

## 2013-11-10 MED ORDER — SODIUM CHLORIDE 0.9 % IJ SOLN
10.0000 mL | INTRAMUSCULAR | Status: DC | PRN
  Administered 2013-11-10: 10 mL
  Filled 2013-11-10: qty 10

## 2013-11-10 MED ORDER — ACETAMINOPHEN 325 MG PO TABS
ORAL_TABLET | ORAL | Status: AC
Start: 2013-11-10 — End: 2013-11-10
  Filled 2013-11-10: qty 2

## 2013-11-10 MED ORDER — DIPHENHYDRAMINE HCL 25 MG PO CAPS
ORAL_CAPSULE | ORAL | Status: AC
  Filled 2013-11-10: qty 2

## 2013-11-10 MED ORDER — SODIUM CHLORIDE 0.9 % IV SOLN
Freq: Once | INTRAVENOUS | Status: AC
  Administered 2013-11-10: 16:00:00 via INTRAVENOUS

## 2013-11-10 NOTE — Patient Instructions (Signed)
Proceed with herceptin today  Return on 4/21 for next visit and treatment  Referral to Dr. Oretha Caprice

## 2013-11-10 NOTE — Patient Instructions (Signed)
[facility] Discharge Instructions for Patients Receiving Chemotherapy  Today you received the following chemotherapy agents Herceptin.  To help prevent nausea and vomiting after your treatment, we encourage you to take your nausea medication as prescribed.   If you develop nausea and vomiting that is not controlled by your nausea medication, call the clinic.   BELOW ARE SYMPTOMS THAT SHOULD BE REPORTED IMMEDIATELY:  *FEVER GREATER THAN 100.5 F  *CHILLS WITH OR WITHOUT FEVER  NAUSEA AND VOMITING THAT IS NOT CONTROLLED WITH YOUR NAUSEA MEDICATION  *UNUSUAL SHORTNESS OF BREATH  *UNUSUAL BRUISING OR BLEEDING  TENDERNESS IN MOUTH AND THROAT WITH OR WITHOUT PRESENCE OF ULCERS  *URINARY PROBLEMS  *BOWEL PROBLEMS  UNUSUAL RASH Items with * indicate a potential emergency and should be followed up as soon as possible.  Feel free to call the clinic you have any questions or concerns. The clinic phone number is [phone].    

## 2013-11-10 NOTE — Telephone Encounter (Signed)
, °

## 2013-11-11 ENCOUNTER — Telehealth: Payer: Self-pay | Admitting: *Deleted

## 2013-11-11 NOTE — Telephone Encounter (Signed)
Per staff message and POF I have scheduled appts.  [name]  

## 2013-11-12 NOTE — Progress Notes (Signed)
[facility] OFFICE PROGRESS NOTE  Patient Care Team: [name], MD as PCP - General Obie Dredge. Brooks as Referring Physician (Obstetrics and Gynecology) Dr. Thea Silversmith  Dr. 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 At that time she had mammogram performed and it was negative. However her breast continue to increase in size. Initially patient felt that it could have been cysts or something on and therefore she did not go t o a physician. However subsequently patient's breast became quite painful and she also began to feel in knot under her right arm.because of this she went to her physician who did a physical exam and a mammogram was performed on 01/01/2013 the mammogram showed diffuse skin thickening generalized increased density within the right breast and enlarged right axillary lymph nodes. She went on to have an ultrasound of the right breast performed that showed a large spiculated hypoechoic mass in the right breast 11:00 2 cm from the nipple measuring at least 4.5 cm in size. There were abnormal lymph nodes with thickened cortex and the right axilla the largest one measuring 3.1 cm in dimension.  #2Patient was recommended ultrasound guided core biopsy of the right breast mass and the right axilla lymph node. The biopsy was performed on 01/01/2013. The right needle core biopsy of the primary mass showed invasive mammary carcinoma grade 2-3 was invasive ductal. The lymph node of the right axilla was positive for metastatic disease. The tumor was ER PR negative HER-2/neu positive with a Ki-67 of 76%. On 01/09/2013 patient had MRI of the breasts performed that showed diffuse right breast skin thickening and edema with  right axillary lymph node. There was a 5 mm internal mammary lymph node that was incompletely imaged. The right breast mass measured 8 x 5 x 4.7 cm. There were abnormal areas involving all 4 quadrants. There was however no involvement of the pectoralis minor for lower chest wall. The left breast showed 2 areas of nodular enhancement these were biopsied and were fibroadenomas  #3 patient is status post Neoadjuvant with curative intent Taxotere carboplatinum Herceptin and perjeta starting 01/16/2013 - 05/01/2013.  #5 patient is status post bilateral mastectomies with the left breast revealing no evidence of cancer. Right breast reveals no residual carcinoma 14 right axillary lymph nodes were negative for metastatic disease.  #6 patient began adjuvant Herceptin starting 06/16/2013. Patient will complete out 1 year of adjuvant Herceptin.  #7 Patient is s/p adjuvant radiation therapy from 07/27/2013-09/11/2013.   CURRENT THERAPY:  cycle 8 of adjuvant Herceptin    INTERVAL HISTORY: Jasmine Schwartz 48 y.o. female returns for followup visit to proceed with cycle 8 of adjuvant Herceptin. She is receiving this every 3 weeks. She seems to be tolerating it well without any significant problems. She is up-to-date on her echocardiograms. She tells me she feels almost back to normal. Today she was accompanied by her son. Remainder of the review of systems is as below  I have reviewed the past medical history, past surgical history, social history and family history with the patient and they are unchanged from previous note.  ALLERGIES:  has No Known Allergies.  MEDICATIONS:  Current Outpatient Prescriptions  Medication Sig Dispense Refill  . cyanocobalamin 500 MCG tablet Take  500 mcg by mouth daily.      Marland Kitchen glucose blood (NOVA MAX TEST) test strip Use as directed up to 8 times a day      . insulin aspart (NOVOLOG) 100 UNIT/ML injection Inject as directed into Insulin pump, total of 40 units/day  20 mL  11  .  Insulin Infusion Pump (MINIMED INSULIN PUMP) DEVI Inject 1 Units/hr into the vein continuous. Pt give extra per cards at meal times. Basic meals are about 5 units      . Insulin Infusion Pump Supplies (MINIMED INFUSION SET-MMT 397) MISC 1 Device by Does not apply route every 3 (three) days.      . Insulin Infusion Pump Supplies (PARADIGM PUMP RESERVOIR 1.76ML) MISC 1 Device by Does not apply route every 3 (three) days.      Marland Kitchen LORazepam (ATIVAN) 0.5 MG tablet Take 1 tablet (0.5 mg total) by mouth every 8 (eight) hours as needed for anxiety.  30 tablet  2   No current facility-administered medications for this visit.    REVIEW OF SYSTEMS:   Constitutional: Denies fevers, chills or abnormal weight loss Eyes: Denies blurriness of vision Ears, nose, mouth, throat, and face: Denies mucositis or sore throat Respiratory: Denies cough, dyspnea or wheezes Cardiovascular: Denies palpitation, chest discomfort or lower extremity swelling Gastrointestinal:  Denies nausea, heartburn or change in bowel habits Skin: Denies abnormal skin rashes Lymphatics: Denies new lymphadenopathy or easy bruising Neurological:Denies numbness, tingling or new weaknesses Behavioral/Psych: Mood is stable, no new changes  All other systems were reviewed with the patient and are negative.  PHYSICAL EXAMINATION: ECOG PERFORMANCE STATUS: 0 - Asymptomatic  Filed Vitals:   11/10/13 1449  BP: 123/76  Pulse: 89  Temp: 98.2 F (36.8 C)  Resp: 18   Filed Weights   11/10/13 1449  Weight: 150 lb 4.8 oz (68.176 kg)    GENERAL:alert, no distress and comfortable SKIN: skin color, texture, turgor are normal, no rashes or significant lesions EYES: normal, Conjunctiva are pink and non-injected, sclera clear OROPHARYNX:no exudate, no erythema and lips, buccal mucosa, and tongue normal  NECK: supple, thyroid normal size, non-tender, without nodularity LYMPH:  no palpable lymphadenopathy in the cervical, axillary or  inguinal LUNGS: clear to auscultation and percussion with normal breathing effort HEART: regular rate & rhythm and no murmurs and no lower extremity edema ABDOMEN:abdomen soft, non-tender and normal bowel sounds Musculoskeletal:no cyanosis of digits and no clubbing  NEURO: alert & oriented x 3 with fluent speech, no focal motor/sensory deficits Bilateral mastectomy scars are well-healed no evidence of local recurrence.  LABORATORY DATA:  I have reviewed the data as listed    Component Value Date/Time   NA 139 11/10/2013 1437   NA 136 09/29/2013 1249   K 3.8 11/10/2013 1437   K 3.8 09/29/2013 1249   CL 102 09/29/2013 1249   CL 99 01/23/2013 1251   CO2 27 11/10/2013 1437   CO2 25 09/29/2013 1249   GLUCOSE 166* 11/10/2013 1437   GLUCOSE 227* 09/29/2013 1249   GLUCOSE 134* 01/23/2013 1251   BUN 17.6 11/10/2013 1437   BUN 17 09/29/2013 1249   CREATININE 0.8 11/10/2013 1437   CREATININE 0.62 09/29/2013 1249   CALCIUM 9.3 11/10/2013 1437   CALCIUM 8.7 09/29/2013 1249   PROT 7.2 11/10/2013 1437   PROT 6.0 09/29/2013 1249   ALBUMIN 3.6 11/10/2013 1437   ALBUMIN 3.4* 09/29/2013 1249   AST 16 11/10/2013 1437   AST 40* 09/29/2013 1249   ALT 10  11/10/2013 1437   ALT 44* 09/29/2013 1249   ALKPHOS 97 11/10/2013 1437   ALKPHOS 67 09/29/2013 1249   BILITOT 0.46 11/10/2013 1437   BILITOT 0.4 09/29/2013 1249   GFRNONAA >90 06/10/2013 0600   GFRAA >90 06/10/2013 0600    No results found for this basename: SPEP, UPEP,  kappa and lambda light chains    Lab Results  Component Value Date   WBC 7.9 11/10/2013   NEUTROABS 5.8 11/10/2013   HGB 12.6 11/10/2013   HCT 37.3 11/10/2013   MCV 83.6 11/10/2013   PLT 310 11/10/2013      Chemistry      Component Value Date/Time   NA 139 11/10/2013 1437   NA 136 09/29/2013 1249   K 3.8 11/10/2013 1437   K 3.8 09/29/2013 1249   CL 102 09/29/2013 1249   CL 99 01/23/2013 1251   CO2 27 11/10/2013 1437   CO2 25 09/29/2013 1249   BUN 17.6 11/10/2013 1437   BUN 17 09/29/2013 1249    CREATININE 0.8 11/10/2013 1437   CREATININE 0.62 09/29/2013 1249      Component Value Date/Time   CALCIUM 9.3 11/10/2013 1437   CALCIUM 8.7 09/29/2013 1249   ALKPHOS 97 11/10/2013 1437   ALKPHOS 67 09/29/2013 1249   AST 16 11/10/2013 1437   AST 40* 09/29/2013 1249   ALT 10 11/10/2013 1437   ALT 44* 09/29/2013 1249   BILITOT 0.46 11/10/2013 1437   BILITOT 0.4 09/29/2013 1249       RADIOGRAPHIC STUDIES: I have personally reviewed the radiological images as listed and agreed with the findings in the report. No results found.    ASSESSMENT & PLAN:  48 year old female with  #1 stage II (T3 N1) ER negative PR negative HER-2/neu positive invasive ductal carcinoma of the right breast originally measuring 8 cm by MRI. She received neoadjuvant chemotherapy consisting of Taxotere carboplatinum Herceptin and perjeta from 01/16/2013 through 05/01/2013. She overall tolerated it well.  #2 status post bilateral mastectomies 06/09/2013. Her final pathology revealed no evidence of residual disease in the right breast 14 lymph nodes were negative for metastatic disease. She had a complete pathologic response final pathologic staging was T2 N0.  #3 patient is currently receiving adjuvant Herceptin every 3 weeks. She today is here for cycle 8 of her treatment. She seems to continue to tolerated very well. She has been seeing cardiology for ongoing echocardiograms evaluation as well as per protocol. Her last echocardiogram and appointment was  1/27 /2015.  #4 followup: Patient will be seen back in 3 weeks for cycle 9 of Herceptin  Orders Placed This Encounter  Procedures  . Ambulatory referral to Gastroenterology    Referral Priority:  Routine    Referral Type:  Consultation    Referral Reason:  Specialty Services Required    Referred to Provider:  Milus Banister, MD    Requested Specialty:  Gastroenterology    Number of Visits Requested:  1   All questions were answered. The patient knows to call the  clinic with any problems, questions or concerns. No barriers to learning was detected. I spent 15 minutes counseling the patient face to face. The total time spent in the appointment was 25 minutes and more than 50% was on counseling and review of test results and coordination of therapy     Marcy Panning, MD 11/12/2013 6:04 PM

## 2013-11-13 ENCOUNTER — Telehealth: Payer: Self-pay | Admitting: *Deleted

## 2013-11-13 NOTE — Telephone Encounter (Signed)
Received message from Santiago Glad to schedule pt.  Called pt and confirmed 12/23/13 genetic appt w/ her.  Could not schedule the lab appt.  Left a note for Dawn to enter it since she has access to do so and I do not.

## 2013-11-20 ENCOUNTER — Other Ambulatory Visit (HOSPITAL_COMMUNITY): Payer: Self-pay | Admitting: Anesthesiology

## 2013-11-20 DIAGNOSIS — C50919 Malignant neoplasm of unspecified site of unspecified female breast: Secondary | ICD-10-CM

## 2013-12-01 ENCOUNTER — Telehealth: Payer: Self-pay | Admitting: Adult Health

## 2013-12-01 ENCOUNTER — Ambulatory Visit (HOSPITAL_BASED_OUTPATIENT_CLINIC_OR_DEPARTMENT_OTHER): Payer: BC Managed Care – PPO | Admitting: Adult Health

## 2013-12-01 ENCOUNTER — Ambulatory Visit (HOSPITAL_BASED_OUTPATIENT_CLINIC_OR_DEPARTMENT_OTHER): Payer: BC Managed Care – PPO

## 2013-12-01 ENCOUNTER — Encounter: Payer: Self-pay | Admitting: Adult Health

## 2013-12-01 ENCOUNTER — Other Ambulatory Visit (HOSPITAL_BASED_OUTPATIENT_CLINIC_OR_DEPARTMENT_OTHER): Payer: BC Managed Care – PPO

## 2013-12-01 VITALS — BP 129/77 | HR 82 | Temp 98.1°F | Resp 18 | Ht 63.0 in | Wt 154.8 lb

## 2013-12-01 DIAGNOSIS — C50911 Malignant neoplasm of unspecified site of right female breast: Secondary | ICD-10-CM

## 2013-12-01 DIAGNOSIS — Z171 Estrogen receptor negative status [ER-]: Secondary | ICD-10-CM

## 2013-12-01 DIAGNOSIS — C50419 Malignant neoplasm of upper-outer quadrant of unspecified female breast: Secondary | ICD-10-CM

## 2013-12-01 DIAGNOSIS — C50919 Malignant neoplasm of unspecified site of unspecified female breast: Secondary | ICD-10-CM

## 2013-12-01 DIAGNOSIS — C773 Secondary and unspecified malignant neoplasm of axilla and upper limb lymph nodes: Secondary | ICD-10-CM

## 2013-12-01 DIAGNOSIS — Z5112 Encounter for antineoplastic immunotherapy: Secondary | ICD-10-CM

## 2013-12-01 LAB — CBC WITH DIFFERENTIAL/PLATELET
BASO%: 1.1 % (ref 0.0–2.0)
BASOS ABS: 0.1 10*3/uL (ref 0.0–0.1)
EOS%: 4.1 % (ref 0.0–7.0)
Eosinophils Absolute: 0.3 10*3/uL (ref 0.0–0.5)
HEMATOCRIT: 36.6 % (ref 34.8–46.6)
HEMOGLOBIN: 12.4 g/dL (ref 11.6–15.9)
LYMPH#: 1.6 10*3/uL (ref 0.9–3.3)
LYMPH%: 24.4 % (ref 14.0–49.7)
MCH: 28.4 pg (ref 25.1–34.0)
MCHC: 33.9 g/dL (ref 31.5–36.0)
MCV: 83.8 fL (ref 79.5–101.0)
MONO#: 0.4 10*3/uL (ref 0.1–0.9)
MONO%: 6.5 % (ref 0.0–14.0)
NEUT#: 4.2 10*3/uL (ref 1.5–6.5)
NEUT%: 63.9 % (ref 38.4–76.8)
PLATELETS: 235 10*3/uL (ref 145–400)
RBC: 4.37 10*6/uL (ref 3.70–5.45)
RDW: 13.7 % (ref 11.2–14.5)
WBC: 6.6 10*3/uL (ref 3.9–10.3)
nRBC: 0 % (ref 0–0)

## 2013-12-01 LAB — COMPREHENSIVE METABOLIC PANEL (CC13)
ALBUMIN: 3.5 g/dL (ref 3.5–5.0)
ALT: 12 U/L (ref 0–55)
AST: 19 U/L (ref 5–34)
Alkaline Phosphatase: 104 U/L (ref 40–150)
Anion Gap: 12 mEq/L — ABNORMAL HIGH (ref 3–11)
BUN: 13.7 mg/dL (ref 7.0–26.0)
CALCIUM: 9.5 mg/dL (ref 8.4–10.4)
CHLORIDE: 105 meq/L (ref 98–109)
CO2: 25 mEq/L (ref 22–29)
CREATININE: 0.8 mg/dL (ref 0.6–1.1)
GLUCOSE: 134 mg/dL (ref 70–140)
POTASSIUM: 4 meq/L (ref 3.5–5.1)
Sodium: 142 mEq/L (ref 136–145)
Total Bilirubin: 0.38 mg/dL (ref 0.20–1.20)
Total Protein: 7.1 g/dL (ref 6.4–8.3)

## 2013-12-01 MED ORDER — TRASTUZUMAB CHEMO INJECTION 440 MG
6.0000 mg/kg | Freq: Once | INTRAVENOUS | Status: AC
  Administered 2013-12-01: 399 mg via INTRAVENOUS
  Filled 2013-12-01: qty 19

## 2013-12-01 MED ORDER — ACETAMINOPHEN 325 MG PO TABS
ORAL_TABLET | ORAL | Status: AC
  Filled 2013-12-01: qty 2

## 2013-12-01 MED ORDER — DIPHENHYDRAMINE HCL 25 MG PO CAPS
ORAL_CAPSULE | ORAL | Status: AC
  Filled 2013-12-01: qty 2

## 2013-12-01 MED ORDER — SODIUM CHLORIDE 0.9 % IV SOLN
Freq: Once | INTRAVENOUS | Status: AC
  Administered 2013-12-01: 15:00:00 via INTRAVENOUS

## 2013-12-01 MED ORDER — DIPHENHYDRAMINE HCL 25 MG PO CAPS
50.0000 mg | ORAL_CAPSULE | Freq: Once | ORAL | Status: AC
  Administered 2013-12-01: 50 mg via ORAL

## 2013-12-01 MED ORDER — ACETAMINOPHEN 325 MG PO TABS
650.0000 mg | ORAL_TABLET | Freq: Once | ORAL | Status: AC
  Administered 2013-12-01: 650 mg via ORAL

## 2013-12-01 MED ORDER — SODIUM CHLORIDE 0.9 % IJ SOLN
10.0000 mL | INTRAMUSCULAR | Status: DC | PRN
  Administered 2013-12-01: 10 mL
  Filled 2013-12-01: qty 10

## 2013-12-01 MED ORDER — HEPARIN SOD (PORK) LOCK FLUSH 100 UNIT/ML IV SOLN
500.0000 [IU] | Freq: Once | INTRAVENOUS | Status: AC | PRN
  Administered 2013-12-01: 500 [IU]
  Filled 2013-12-01: qty 5

## 2013-12-01 NOTE — Telephone Encounter (Signed)
cld Dr office to get a f/u appt for the pt. Left pt MRN on voicemail and adv to call to set the appt w/pt

## 2013-12-01 NOTE — Patient Instructions (Signed)
[facility] Discharge Instructions for Patients Receiving Chemotherapy  Today you received the following chemotherapy agents herceptin   To help prevent nausea and vomiting after your treatment, we encourage you to take your nausea medication as directed   If you develop nausea and vomiting that is not controlled by your nausea medication, call the clinic.   BELOW ARE SYMPTOMS THAT SHOULD BE REPORTED IMMEDIATELY:  *FEVER GREATER THAN 100.5 F  *CHILLS WITH OR WITHOUT FEVER  NAUSEA AND VOMITING THAT IS NOT CONTROLLED WITH YOUR NAUSEA MEDICATION  *UNUSUAL SHORTNESS OF BREATH  *UNUSUAL BRUISING OR BLEEDING  TENDERNESS IN MOUTH AND THROAT WITH OR WITHOUT PRESENCE OF ULCERS  *URINARY PROBLEMS  *BOWEL PROBLEMS  UNUSUAL RASH Items with * indicate a potential emergency and should be followed up as soon as possible.  Feel free to call the clinic you have any questions or concerns. The clinic phone number is [phone].  

## 2013-12-01 NOTE — Progress Notes (Signed)
Hematology and Oncology Follow Up Visit  Jasmine Schwartz [id number] 1966/08/10 48 y.o. 12/03/2013 3:38 PM     Principle Diagnosis:Jasmine Schwartz 48 y.o. 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     Prior Therapy:  #1patient palpated a right breast mass in October 2013. At that time she had mammogram performed and it was negative. However her breast continue to increase in size. Initially patient felt that it could have been cysts or something on and therefore she did not go t o a physician. However subsequently patient's breast became quite painful and she also began to feel in knot under her right arm.because of this she went to her physician who did a physical exam and a mammogram was performed on 01/01/2013 the mammogram showed diffuse skin thickening generalized increased density within the right breast and enlarged right axillary lymph nodes. She went on to have an ultrasound of the right breast performed that showed a large spiculated hypoechoic mass in the right breast 11:00 2 cm from the nipple measuring at least 4.5 cm in size. There were abnormal lymph nodes with thickened cortex and the right axilla the largest one measuring 3.1 cm in dimension.   #2Patient was recommended ultrasound guided core biopsy of the right breast mass and the right axilla lymph node. The biopsy was performed on 01/01/2013. The right needle core biopsy of the primary mass showed invasive mammary carcinoma grade 2-3 was invasive ductal. The lymph node of the right axilla was positive for metastatic disease. The tumor was ER PR negative HER-2/neu positive with a Ki-67 of 76%. On 01/09/2013 patient had MRI of the breasts performed that showed diffuse right breast skin thickening and edema with right axillary lymph node. There was a 5 mm internal mammary lymph node that was incompletely imaged. The right breast mass measured 8 x 5 x 4.7 cm. There were abnormal areas involving all 4  quadrants. There was however no involvement of the pectoralis minor for lower chest wall. The left breast showed 2 areas of nodular enhancement these were biopsied and were fibroadenomas   #3 patient is status post Neoadjuvant with curative intent Taxotere carboplatinum Herceptin and perjeta starting 01/16/2013 - 05/01/2013.   #5 patient is status post bilateral mastectomies with the left breast revealing no evidence of cancer. Right breast reveals no residual carcinoma 14 right axillary lymph nodes were negative for metastatic disease.   #6 patient began adjuvant Herceptin starting 06/16/2013. Patient will complete out 1 year of adjuvant Herceptin.   #7 Patient is s/p adjuvant radiation therapy from 07/27/2013-09/11/2013.   Current therapy:  Herceptin every three weeks.    Interim History: Jasmine Schwartz 48 y.o. female with stage II HER-2/neu positive breast cancer who is here for labs and evaluation prior to receiving adjuvant every three week Herceptin therapy.  The patient is doing well today.  She denies fevers, chills, nausea, vomiting, constipation, diarrhea, chest pain, palpitations, DOE, orthopnea, or any further concerns.    Medications:  Current Outpatient Prescriptions  Medication Sig Dispense Refill  . cyanocobalamin 500 MCG tablet Take 500 mcg by mouth daily.      Marland Kitchen glucose blood (NOVA MAX TEST) test strip Use as directed up to 8 times a day      . insulin aspart (NOVOLOG) 100 UNIT/ML injection Inject as directed into Insulin pump, total of 40 units/day  20 mL  11  . Insulin Infusion Pump (MINIMED INSULIN PUMP) DEVI Inject 1 Units/hr into the vein  continuous. Pt give extra per cards at meal times. Basic meals are about 5 units      . Insulin Infusion Pump Supplies (MINIMED INFUSION SET-MMT 397) MISC 1 Device by Does not apply route every 3 (three) days.      . Insulin Infusion Pump Supplies (PARADIGM PUMP RESERVOIR 1.76ML) MISC 1 Device by Does not apply route every 3 (three) days.       Marland Kitchen LORazepam (ATIVAN) 0.5 MG tablet Take 1 tablet (0.5 mg total) by mouth every 8 (eight) hours as needed for anxiety.  30 tablet  2   No current facility-administered medications for this visit.     Allergies: No Known Allergies  Medical History: Past Medical History  Diagnosis Date  . Anxiety   . Depression   . Dyslipidemia   . Anxiety and depression 07/06/2011  . Hx of insertion of insulin pump ~ 2001 to present (06/09/2013)  . PONV (postoperative nausea and vomiting)   . Venous insufficiency   . Lower extremity edema   . Diabetes mellitus     IDDM  . Type I diabetes mellitus     uses insulin pump  . Breast cancer 01/01/13    /metastatic 1/1 lymph nodes    Surgical History:  Past Surgical History  Procedure Laterality Date  . Lumbar disc surgery  2009    Dr Maxie Better  . Cesarean section  1989  . Tubal ligation  1991  . Breast biopsy Right 01/01/13    Invasive mammary ca,metastatic in 1/1 lymph node  . Breast biopsy Left 01/09/13    Fibroadenoma,microcalcifications  . Portacath placement Left 01/2013  . Mastectomy modified radical Right 06/09/2013  . Mastectomy complete / simple Left 06/09/2013  . Mastectomy modified radical Right 06/09/2013    Procedure: RIGHT MASTECTOMY MODIFIED RADICAL;  Surgeon: Adin Hector, MD;  Location: [facility];  Service: General;  Laterality: Right;  . Simple mastectomy with axillary sentinel node biopsy Left 06/09/2013    Procedure: LEFT SIMPLE MASTECTOMY;  Surgeon: Adin Hector, MD;  Location: [facility];  Service: General;  Laterality: Left;     Review of Systems: A 10 point review of systems was conducted and is otherwise negative except for what is noted above.     Physical Exam: Blood pressure 129/77, pulse 82, temperature 98.1 F (36.7 C), temperature source Oral, resp. rate 18, height 5' 3"  (1.6 m), weight 154 lb 12.8 oz (70.217 kg). GENERAL: Patient is a well appearing female in no acute distress HEENT:  Sclerae anicteric.   Oropharynx clear and moist. No ulcerations or evidence of oropharyngeal candidiasis. Neck is supple.  NODES:  No cervical, supraclavicular, or axillary lymphadenopathy palpated.  BREAST EXAM:  Deferred. LUNGS:  Clear to auscultation bilaterally.  No wheezes or rhonchi. HEART:  Regular rate and rhythm. No murmur appreciated. ABDOMEN:  Soft, nontender.  Positive, normoactive bowel sounds. No organomegaly palpated. MSK:  No focal spinal tenderness to palpation. Full range of motion bilaterally in the upper extremities. EXTREMITIES:  No peripheral edema.   SKIN:  Clear with no obvious rashes or skin changes. No nail dyscrasia. NEURO:  Nonfocal. Well oriented.  Appropriate affect. ECOG PERFORMANCE STATUS: 0 - Asymptomatic   Lab Results: Lab Results  Component Value Date   WBC 6.6 12/01/2013   HGB 12.4 12/01/2013   HCT 36.6 12/01/2013   MCV 83.8 12/01/2013   PLT 235 12/01/2013     Chemistry      Component Value Date/Time   NA 142 12/01/2013  1354   NA 136 09/29/2013 1249   K 4.0 12/01/2013 1354   K 3.8 09/29/2013 1249   CL 102 09/29/2013 1249   CL 99 01/23/2013 1251   CO2 25 12/01/2013 1354   CO2 25 09/29/2013 1249   BUN 13.7 12/01/2013 1354   BUN 17 09/29/2013 1249   CREATININE 0.8 12/01/2013 1354   CREATININE 0.62 09/29/2013 1249      Component Value Date/Time   CALCIUM 9.5 12/01/2013 1354   CALCIUM 8.7 09/29/2013 1249   ALKPHOS 104 12/01/2013 1354   ALKPHOS 67 09/29/2013 1249   AST 19 12/01/2013 1354   AST 40* 09/29/2013 1249   ALT 12 12/01/2013 1354   ALT 44* 09/29/2013 1249   BILITOT 0.38 12/01/2013 1354   BILITOT 0.4 09/29/2013 1249      Assessment and Plan: Jasmine Schwartz 48 y.o. female with:  1. Stage II, HER-2/neu positive, invasive ductal carcinoma of the right bresat.  The patient is receiving adjuvant Herceptin every three weeks.  She is tolerating this treatment well. Her labs are stable, I reviewed them with her in detail  She wil proceed with Herceptin today.    2. Cardiac:the  patient was evaluated by Dr. Aundra Dubin on 09/08/13 along with an echocardiogram.  Her echocardiogram demonstrated a LVEF of 60-65%.  She was cleared to continue Herceptin.  2 months follow up was recommended.    The patient will return in 3 weeks for labs and evalaution and herceptin.   She knows to call us in the interim for any questions or concerns.  We can certainly see her sooner if needed.   I spent 15 minutes counseling the patient face to face.  The total time spent in the appointment was 30 minutes.  Minette Headland, [place] [phone] 12/03/2013 3:38 PM

## 2013-12-07 NOTE — Telephone Encounter (Signed)
Open in error

## 2013-12-16 ENCOUNTER — Encounter (HOSPITAL_COMMUNITY): Payer: Self-pay

## 2013-12-16 ENCOUNTER — Ambulatory Visit (HOSPITAL_COMMUNITY)
Admission: RE | Admit: 2013-12-16 | Discharge: 2013-12-16 | Disposition: A | Discharge status: Home / Self Care | Payer: BC Managed Care – PPO | Source: Ambulatory Visit | Attending: Oncology | Admitting: Oncology

## 2013-12-16 ENCOUNTER — Ambulatory Visit (HOSPITAL_BASED_OUTPATIENT_CLINIC_OR_DEPARTMENT_OTHER)
Admission: RE | Admit: 2013-12-16 | Discharge: 2013-12-16 | Disposition: A | Discharge status: Home / Self Care | Payer: BC Managed Care – PPO | Source: Ambulatory Visit | Attending: Internal Medicine | Admitting: Internal Medicine

## 2013-12-16 VITALS — BP 112/58 | HR 76 | Wt 153.8 lb

## 2013-12-16 DIAGNOSIS — C50911 Malignant neoplasm of unspecified site of right female breast: Secondary | ICD-10-CM

## 2013-12-16 DIAGNOSIS — I519 Heart disease, unspecified: Secondary | ICD-10-CM

## 2013-12-16 DIAGNOSIS — C50919 Malignant neoplasm of unspecified site of unspecified female breast: Secondary | ICD-10-CM

## 2013-12-16 DIAGNOSIS — Z9010 Acquired absence of unspecified breast and nipple: Secondary | ICD-10-CM | POA: Insufficient documentation

## 2013-12-16 DIAGNOSIS — E119 Type 2 diabetes mellitus without complications: Secondary | ICD-10-CM | POA: Insufficient documentation

## 2013-12-16 NOTE — Progress Notes (Signed)
  Echocardiogram 2D Echocardiogram has been performed.  Donata Clay 12/16/2013, 2:10 PM

## 2013-12-16 NOTE — Patient Instructions (Addendum)
Congratulations on completing your treatment.  Your physician recommends that you schedule a follow-up appointment- AS NEEDED, call us if you need [place].

## 2013-12-16 NOTE — Progress Notes (Signed)
Patient ID: Jasmine Schwartz, female   DOB: 24-Aug-1965, 48 y.o.   MRN: [id number] Oncologist: Dr Humphrey Rolls  General Surgeon: Dr Dalbert Batman   HPI:    Jasmine Schwartz is a 48 year old with PMH of DM I, R breast cancer ER/PR negative HER-2 positive invasive ductal carcinoma the Ki-67 was 76% with no known coronary disease. She is referred by Dr. Humphrey Rolls for enrollment into the cardio-oncology clinic.   Finished chemotherapy and had bilateral mastectomies in 10/14. Herceptin started 01/2013 and will continue for 1 year.    She returns for follow up . Last visit lasix was stopped. Denies SOB/PND/Orthopnea. She will complete Herceptin in late June.   ECHOs 01/13/13 EF 60% Lateral S' 11.2 05/27/13 EF 60%, lateral S' 10.7 Global strain - 20.7% 1/15 EF 55-60%, lateral s' 10.4, GLS -21.9% 12/16/13 EF 55-60% lateral s' 10.7 GLS -20.7%   ROS: All systems negative except as listed in HPI, PMH and Problem List.  Past Medical History  Diagnosis Date  . Anxiety   . Depression   . Dyslipidemia   . Anxiety and depression 07/06/2011  . Hx of insertion of insulin pump ~ 2001 to present (06/09/2013)  . PONV (postoperative nausea and vomiting)   . Venous insufficiency   . Lower extremity edema   . Diabetes mellitus     IDDM  . Type I diabetes mellitus     uses insulin pump  . Breast cancer 01/01/13    /metastatic 1/1 lymph nodes    Current Outpatient Prescriptions  Medication Sig Dispense Refill  . Insulin Infusion Pump (MINIMED INSULIN PUMP) DEVI Inject 1 Units/hr into the vein continuous. Pt give extra per cards at meal times. Basic meals are about 5 units      . Insulin Infusion Pump Supplies (MINIMED INFUSION SET-MMT 397) MISC 1 Device by Does not apply route every 3 (three) days.      . cyanocobalamin 500 MCG tablet Take 500 mcg by mouth daily.      Marland Kitchen glucose blood (NOVA MAX TEST) test strip Use as directed up to 8 times a day      . insulin aspart (NOVOLOG) 100 UNIT/ML injection Inject as directed into Insulin pump,  total of 40 units/day  20 mL  11  . Insulin Infusion Pump Supplies (PARADIGM PUMP RESERVOIR 1.76ML) MISC 1 Device by Does not apply route every 3 (three) days.      Marland Kitchen LORazepam (ATIVAN) 0.5 MG tablet Take 1 tablet (0.5 mg total) by mouth every 8 (eight) hours as needed for anxiety.  30 tablet  2   No current facility-administered medications for this encounter.    Filed Vitals:   12/16/13 1455  BP: 112/58  Pulse: 76  Weight: 153 lb 12.8 oz (69.763 kg)  SpO2: 100%   PHYSICAL EXAM: General:  Well appearing. No resp difficulty HEENT: normal Neck: supple. JVP 6-7. Carotids 2+ bilaterally; no bruits. No lymphadenopathy or thryomegaly appreciated. Cor: PMI normal. Regular rate & rhythm. No rubs, gallops or murmurs. Lungs: clear Abdomen: soft, nontender, nondistended. No hepatosplenomegaly. No bruits or masses. Good bowel sounds. Extremities: no cyanosis, clubbing, rash.  No edema. Neuro: alert & orientedx3, cranial nerves grossly intact. Moves all 4 extremities w/o difficulty. Affect pleasant.  ASSESSMENT & PLAN:  1) Breast Cancer: She will complete Herceptin late June. Dr Haroldine Laws reviewed and discussed ECHO.   EF, lateral s' and global longitudinal strain are stable.    Follow up in HF clinic as needed.   Amy D  Clegg Np-C  12/16/2013  Patient seen and examined with Darrick Grinder, NP. We discussed all aspects of the encounter. I agree with the assessment and plan as stated above.  I reviewed echos personally. EF and Doppler parameters stable. No HF on exam. She has 3 Herceptin treatments left. She will complete theses and f/u with [place] on PRN basis.   Shaune Pascal Bensimhon,MD 4:15 PM

## 2013-12-22 ENCOUNTER — Ambulatory Visit (HOSPITAL_BASED_OUTPATIENT_CLINIC_OR_DEPARTMENT_OTHER): Payer: BC Managed Care – PPO

## 2013-12-22 ENCOUNTER — Encounter: Payer: Self-pay | Admitting: Adult Health

## 2013-12-22 ENCOUNTER — Ambulatory Visit (HOSPITAL_BASED_OUTPATIENT_CLINIC_OR_DEPARTMENT_OTHER): Payer: BC Managed Care – PPO | Admitting: Adult Health

## 2013-12-22 ENCOUNTER — Other Ambulatory Visit (HOSPITAL_BASED_OUTPATIENT_CLINIC_OR_DEPARTMENT_OTHER): Payer: BC Managed Care – PPO

## 2013-12-22 ENCOUNTER — Telehealth: Payer: Self-pay | Admitting: Adult Health

## 2013-12-22 VITALS — BP 122/78 | HR 18 | Temp 98.3°F | Resp 18 | Ht 63.0 in | Wt 154.2 lb

## 2013-12-22 VITALS — HR 68

## 2013-12-22 DIAGNOSIS — C50419 Malignant neoplasm of upper-outer quadrant of unspecified female breast: Secondary | ICD-10-CM

## 2013-12-22 DIAGNOSIS — C50911 Malignant neoplasm of unspecified site of right female breast: Secondary | ICD-10-CM

## 2013-12-22 DIAGNOSIS — C773 Secondary and unspecified malignant neoplasm of axilla and upper limb lymph nodes: Secondary | ICD-10-CM

## 2013-12-22 DIAGNOSIS — Z5112 Encounter for antineoplastic immunotherapy: Secondary | ICD-10-CM

## 2013-12-22 DIAGNOSIS — Z171 Estrogen receptor negative status [ER-]: Secondary | ICD-10-CM

## 2013-12-22 LAB — CBC WITH DIFFERENTIAL/PLATELET
BASO%: 0.5 % (ref 0.0–2.0)
BASOS ABS: 0 10*3/uL (ref 0.0–0.1)
EOS ABS: 0.3 10*3/uL (ref 0.0–0.5)
EOS%: 5 % (ref 0.0–7.0)
HEMATOCRIT: 36.1 % (ref 34.8–46.6)
HEMOGLOBIN: 12.1 g/dL (ref 11.6–15.9)
LYMPH%: 20.6 % (ref 14.0–49.7)
MCH: 28.5 pg (ref 25.1–34.0)
MCHC: 33.6 g/dL (ref 31.5–36.0)
MCV: 84.7 fL (ref 79.5–101.0)
MONO#: 0.4 10*3/uL (ref 0.1–0.9)
MONO%: 6.1 % (ref 0.0–14.0)
NEUT#: 4.4 10*3/uL (ref 1.5–6.5)
NEUT%: 67.8 % (ref 38.4–76.8)
Platelets: 295 10*3/uL (ref 145–400)
RBC: 4.27 10*6/uL (ref 3.70–5.45)
RDW: 14.5 % (ref 11.2–14.5)
WBC: 6.5 10*3/uL (ref 3.9–10.3)
lymph#: 1.3 10*3/uL (ref 0.9–3.3)

## 2013-12-22 LAB — COMPREHENSIVE METABOLIC PANEL (CC13)
ALT: 18 U/L (ref 0–55)
ANION GAP: 10 meq/L (ref 3–11)
AST: 21 U/L (ref 5–34)
Albumin: 3.5 g/dL (ref 3.5–5.0)
Alkaline Phosphatase: 101 U/L (ref 40–150)
BUN: 22.8 mg/dL (ref 7.0–26.0)
CALCIUM: 9.4 mg/dL (ref 8.4–10.4)
CO2: 25 meq/L (ref 22–29)
CREATININE: 0.9 mg/dL (ref 0.6–1.1)
Chloride: 102 mEq/L (ref 98–109)
Glucose: 162 mg/dl — ABNORMAL HIGH (ref 70–140)
Potassium: 4.1 mEq/L (ref 3.5–5.1)
Sodium: 137 mEq/L (ref 136–145)
Total Bilirubin: 0.51 mg/dL (ref 0.20–1.20)
Total Protein: 7 g/dL (ref 6.4–8.3)

## 2013-12-22 MED ORDER — DIPHENHYDRAMINE HCL 25 MG PO CAPS
ORAL_CAPSULE | ORAL | Status: AC
  Filled 2013-12-22: qty 2

## 2013-12-22 MED ORDER — SODIUM CHLORIDE 0.9 % IV SOLN
Freq: Once | INTRAVENOUS | Status: AC
  Administered 2013-12-22: 16:00:00 via INTRAVENOUS

## 2013-12-22 MED ORDER — HEPARIN SOD (PORK) LOCK FLUSH 100 UNIT/ML IV SOLN
500.0000 [IU] | Freq: Once | INTRAVENOUS | Status: AC | PRN
Start: 2013-12-22 — End: 2013-12-22
  Administered 2013-12-22: 500 [IU]
  Filled 2013-12-22: qty 5

## 2013-12-22 MED ORDER — ACETAMINOPHEN 325 MG PO TABS
650.0000 mg | ORAL_TABLET | Freq: Once | ORAL | Status: AC
  Administered 2013-12-22: 650 mg via ORAL

## 2013-12-22 MED ORDER — DIPHENHYDRAMINE HCL 25 MG PO CAPS
50.0000 mg | ORAL_CAPSULE | Freq: Once | ORAL | Status: AC
  Administered 2013-12-22: 50 mg via ORAL

## 2013-12-22 MED ORDER — TRASTUZUMAB CHEMO INJECTION 440 MG
6.0000 mg/kg | Freq: Once | INTRAVENOUS | Status: AC
  Administered 2013-12-22: 399 mg via INTRAVENOUS
  Filled 2013-12-22: qty 19

## 2013-12-22 MED ORDER — SODIUM CHLORIDE 0.9 % IJ SOLN
10.0000 mL | INTRAMUSCULAR | Status: DC | PRN
  Administered 2013-12-22: 10 mL
  Filled 2013-12-22: qty 10

## 2013-12-22 MED ORDER — ACETAMINOPHEN 325 MG PO TABS
ORAL_TABLET | ORAL | Status: AC
  Filled 2013-12-22: qty 2

## 2013-12-22 NOTE — Telephone Encounter (Signed)
, °

## 2013-12-22 NOTE — Progress Notes (Signed)
Insurance covers her herceptin.

## 2013-12-22 NOTE — Patient Instructions (Signed)
[facility] Discharge Instructions for Patients Receiving Chemotherapy  Today you received the following chemotherapy agents Herceptin  To help prevent nausea and vomiting after your treatment, we encourage you to take your nausea medication     If you develop nausea and vomiting that is not controlled by your nausea medication, call the clinic.   BELOW ARE SYMPTOMS THAT SHOULD BE REPORTED IMMEDIATELY:  *FEVER GREATER THAN 100.5 F  *CHILLS WITH OR WITHOUT FEVER  NAUSEA AND VOMITING THAT IS NOT CONTROLLED WITH YOUR NAUSEA MEDICATION  *UNUSUAL SHORTNESS OF BREATH  *UNUSUAL BRUISING OR BLEEDING  TENDERNESS IN MOUTH AND THROAT WITH OR WITHOUT PRESENCE OF ULCERS  *URINARY PROBLEMS  *BOWEL PROBLEMS  UNUSUAL RASH Items with * indicate a potential emergency and should be followed up as soon as possible.  Feel free to call the clinic you have any questions or concerns. The clinic phone number is [phone].    

## 2013-12-23 ENCOUNTER — Telehealth: Payer: Self-pay | Admitting: *Deleted

## 2013-12-23 NOTE — Telephone Encounter (Signed)
Per staff message and POF I have scheduled appts.  [name]  

## 2013-12-24 ENCOUNTER — Encounter: Payer: Self-pay | Admitting: Adult Health

## 2013-12-24 NOTE — Progress Notes (Signed)
Hematology and Oncology Follow Up Visit  Nimisha Rathel [id number] June 18, 1966 48 y.o. 12/24/2013 1:34 PM     Principle Diagnosis:Jasmine Schwartz 48 y.o. 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     Prior Therapy:  #1patient palpated a right breast mass in October 2013. At that time she had mammogram performed and it was negative. However her breast continue to increase in size. Initially patient felt that it could have been cysts or something on and therefore she did not go t o a physician. However subsequently patient's breast became quite painful and she also began to feel in knot under her right arm.because of this she went to her physician who did a physical exam and a mammogram was performed on 01/01/2013 the mammogram showed diffuse skin thickening generalized increased density within the right breast and enlarged right axillary lymph nodes. She went on to have an ultrasound of the right breast performed that showed a large spiculated hypoechoic mass in the right breast 11:00 2 cm from the nipple measuring at least 4.5 cm in size. There were abnormal lymph nodes with thickened cortex and the right axilla the largest one measuring 3.1 cm in dimension.   #2Patient was recommended ultrasound guided core biopsy of the right breast mass and the right axilla lymph node. The biopsy was performed on 01/01/2013. The right needle core biopsy of the primary mass showed invasive mammary carcinoma grade 2-3 was invasive ductal. The lymph node of the right axilla was positive for metastatic disease. The tumor was ER PR negative HER-2/neu positive with a Ki-67 of 76%. On 01/09/2013 patient had MRI of the breasts performed that showed diffuse right breast skin thickening and edema with right axillary lymph node. There was a 5 mm internal mammary lymph node that was incompletely imaged. The right breast mass measured 8 x 5 x 4.7 cm. There were abnormal areas involving all 4  quadrants. There was however no involvement of the pectoralis minor for lower chest wall. The left breast showed 2 areas of nodular enhancement these were biopsied and were fibroadenomas   #3 patient is status post Neoadjuvant with curative intent Taxotere carboplatinum Herceptin and perjeta starting 01/16/2013 - 05/01/2013.   #5 patient is status post bilateral mastectomies with the left breast revealing no evidence of cancer. Right breast reveals no residual carcinoma 14 right axillary lymph nodes were negative for metastatic disease.   #6 patient began adjuvant Herceptin starting 06/16/2013. Patient will complete out 1 year of adjuvant Herceptin.   #7 Patient is s/p adjuvant radiation therapy from 07/27/2013-09/11/2013.   Current therapy:  Herceptin every three weeks.    Interim History: Ladaija Dimino 48 y.o. female with stage II HER-2/neu positive breast cancer who is here for labs and evaluation prior to receiving adjuvant every three week Herceptin therapy.  She continues to tolerate Herceptin very well.  She denies fevers, chills, nausea, vomiting, constipation, diarrhea, chest pain, palpitations, or any further concerns.  A 10 point review of systems was conducted and is otherwise negative except for what is noted above.    Medications:  Current Outpatient Prescriptions  Medication Sig Dispense Refill  . glucose blood (NOVA MAX TEST) test strip Use as directed up to 8 times a day      . insulin aspart (NOVOLOG) 100 UNIT/ML injection Inject as directed into Insulin pump, total of 40 units/day  20 mL  11  . Insulin Infusion Pump (MINIMED INSULIN PUMP) DEVI Inject 1 Units/hr into  the vein continuous. Pt give extra per cards at meal times. Basic meals are about 5 units      . Insulin Infusion Pump Supplies (MINIMED INFUSION SET-MMT 397) MISC 1 Device by Does not apply route every 3 (three) days.      . Insulin Infusion Pump Supplies (PARADIGM PUMP RESERVOIR 1.76ML) MISC 1 Device by Does not  apply route every 3 (three) days.      . cyanocobalamin 500 MCG tablet Take 500 mcg by mouth daily.      Marland Kitchen LORazepam (ATIVAN) 0.5 MG tablet Take 1 tablet (0.5 mg total) by mouth every 8 (eight) hours as needed for anxiety.  30 tablet  2   No current facility-administered medications for this visit.     Allergies: No Known Allergies  Medical History: Past Medical History  Diagnosis Date  . Anxiety   . Depression   . Dyslipidemia   . Anxiety and depression 07/06/2011  . Hx of insertion of insulin pump ~ 2001 to present (06/09/2013)  . PONV (postoperative nausea and vomiting)   . Venous insufficiency   . Lower extremity edema   . Diabetes mellitus     IDDM  . Type I diabetes mellitus     uses insulin pump  . Breast cancer 01/01/13    /metastatic 1/1 lymph nodes    Surgical History:  Past Surgical History  Procedure Laterality Date  . Lumbar disc surgery  2009    Dr Maxie Better  . Cesarean section  1989  . Tubal ligation  1991  . Breast biopsy Right 01/01/13    Invasive mammary ca,metastatic in 1/1 lymph node  . Breast biopsy Left 01/09/13    Fibroadenoma,microcalcifications  . Portacath placement Left 01/2013  . Mastectomy modified radical Right 06/09/2013  . Mastectomy complete / simple Left 06/09/2013  . Mastectomy modified radical Right 06/09/2013    Procedure: RIGHT MASTECTOMY MODIFIED RADICAL;  Surgeon: Adin Hector, MD;  Location: [place];  Service: General;  Laterality: Right;  . Simple mastectomy with axillary sentinel node biopsy Left 06/09/2013    Procedure: LEFT SIMPLE MASTECTOMY;  Surgeon: Adin Hector, MD;  Location: [place];  Service: General;  Laterality: Left;     Review of Systems: A 10 point review of systems was conducted and is otherwise negative except for what is noted above.     Physical Exam: Blood pressure 122/78, pulse 18, temperature 98.3 F (36.8 C), temperature source Oral, resp. rate 18, height 5' 3"  (1.6 m), weight 154 lb 3.2 oz (69.945  kg). GENERAL: Patient is a well appearing female in no acute distress HEENT:  Sclerae anicteric.  Oropharynx clear and moist. No ulcerations or evidence of oropharyngeal candidiasis. Neck is supple.  NODES:  No cervical, supraclavicular, or axillary lymphadenopathy palpated.  BREAST EXAM:  S/p bilateral mastectomy with reconstruction.  No swelling, no masses, benign bilateral breast exam.  LUNGS:  Clear to auscultation bilaterally.  No wheezes or rhonchi. HEART:  Regular rate and rhythm. No murmur appreciated. ABDOMEN:  Soft, nontender.  Positive, normoactive bowel sounds. No organomegaly palpated. MSK:  No focal spinal tenderness to palpation. Full range of motion bilaterally in the upper extremities. EXTREMITIES:  No peripheral edema.   SKIN:  Clear with no obvious rashes or skin changes. No nail dyscrasia. NEURO:  Nonfocal. Well oriented.  Appropriate affect. ECOG PERFORMANCE STATUS: 0 - Asymptomatic   Lab Results: Lab Results  Component Value Date   WBC 6.5 12/22/2013   HGB 12.1 12/22/2013  HCT 36.1 12/22/2013   MCV 84.7 12/22/2013   PLT 295 12/22/2013     Chemistry      Component Value Date/Time   NA 137 12/22/2013 1345   NA 136 09/29/2013 1249   K 4.1 12/22/2013 1345   K 3.8 09/29/2013 1249   CL 102 09/29/2013 1249   CL 99 01/23/2013 1251   CO2 25 12/22/2013 1345   CO2 25 09/29/2013 1249   BUN 22.8 12/22/2013 1345   BUN 17 09/29/2013 1249   CREATININE 0.9 12/22/2013 1345   CREATININE 0.62 09/29/2013 1249      Component Value Date/Time   CALCIUM 9.4 12/22/2013 1345   CALCIUM 8.7 09/29/2013 1249   ALKPHOS 101 12/22/2013 1345   ALKPHOS 67 09/29/2013 1249   AST 21 12/22/2013 1345   AST 40* 09/29/2013 1249   ALT 18 12/22/2013 1345   ALT 44* 09/29/2013 1249   BILITOT 0.51 12/22/2013 1345   BILITOT 0.4 09/29/2013 1249      Assessment and Plan: Everline Mahaffy 48 y.o. female with:  1. Stage II, HER-2/neu positive, invasive ductal carcinoma of the right bresat.  The patient is receiving  adjuvant Herceptin every three weeks.  She is tolerating this treatment well. Her labs are stable, I reviewed them with her in detail  She wil proceed with Herceptin today.    2. Cardiac:the patient was evaluated by Dr. Haroldine Laws on  12/16/13 along with an echocardiogram.  Her echocardiogram demonstrated a LVEF of 55%.  She was cleared to continue Herceptin.  Due to the fact that she has 2 more Herceptin treatments left, a 3 month follow up was recommended.   The patient will return in 3 weeks and in 6 weeks for labs and herceptin. She will return in 3 months for labs and evaluation.  She knows to call us in the interim for any questions or concerns.  We can certainly see her sooner if needed.   I spent 15 minutes counseling the patient face to face.  The total time spent in the appointment was 30 minutes.  Minette Headland, [place] [phone] 12/24/2013 1:34 PM

## 2013-12-25 ENCOUNTER — Other Ambulatory Visit: Payer: BC Managed Care – PPO

## 2014-01-05 ENCOUNTER — Ambulatory Visit: Payer: BC Managed Care – PPO | Admitting: Gastroenterology

## 2014-01-12 ENCOUNTER — Ambulatory Visit (HOSPITAL_BASED_OUTPATIENT_CLINIC_OR_DEPARTMENT_OTHER): Payer: BC Managed Care – PPO

## 2014-01-12 ENCOUNTER — Other Ambulatory Visit (HOSPITAL_BASED_OUTPATIENT_CLINIC_OR_DEPARTMENT_OTHER): Payer: BC Managed Care – PPO

## 2014-01-12 ENCOUNTER — Other Ambulatory Visit: Payer: Self-pay | Admitting: Oncology

## 2014-01-12 ENCOUNTER — Other Ambulatory Visit: Payer: Self-pay | Admitting: Adult Health

## 2014-01-12 ENCOUNTER — Ambulatory Visit: Payer: BC Managed Care – PPO | Admitting: Adult Health

## 2014-01-12 ENCOUNTER — Encounter: Payer: Self-pay | Admitting: Oncology

## 2014-01-12 VITALS — BP 122/64 | HR 67 | Temp 97.2°F | Resp 18

## 2014-01-12 DIAGNOSIS — C50419 Malignant neoplasm of upper-outer quadrant of unspecified female breast: Secondary | ICD-10-CM

## 2014-01-12 DIAGNOSIS — Z5112 Encounter for antineoplastic immunotherapy: Secondary | ICD-10-CM

## 2014-01-12 DIAGNOSIS — C773 Secondary and unspecified malignant neoplasm of axilla and upper limb lymph nodes: Secondary | ICD-10-CM

## 2014-01-12 DIAGNOSIS — C50911 Malignant neoplasm of unspecified site of right female breast: Secondary | ICD-10-CM

## 2014-01-12 LAB — COMPREHENSIVE METABOLIC PANEL (CC13)
ALT: 13 U/L (ref 0–55)
AST: 17 U/L (ref 5–34)
Albumin: 3.5 g/dL (ref 3.5–5.0)
Alkaline Phosphatase: 87 U/L (ref 40–150)
Anion Gap: 14 mEq/L — ABNORMAL HIGH (ref 3–11)
BUN: 17.4 mg/dL (ref 7.0–26.0)
CALCIUM: 9.2 mg/dL (ref 8.4–10.4)
CHLORIDE: 102 meq/L (ref 98–109)
CO2: 23 meq/L (ref 22–29)
Creatinine: 0.8 mg/dL (ref 0.6–1.1)
GLUCOSE: 161 mg/dL — AB (ref 70–140)
POTASSIUM: 4.4 meq/L (ref 3.5–5.1)
Sodium: 139 mEq/L (ref 136–145)
Total Bilirubin: 0.62 mg/dL (ref 0.20–1.20)
Total Protein: 7 g/dL (ref 6.4–8.3)

## 2014-01-12 LAB — CBC WITH DIFFERENTIAL/PLATELET
BASO%: 0.9 % (ref 0.0–2.0)
BASOS ABS: 0.1 10*3/uL (ref 0.0–0.1)
EOS%: 3.7 % (ref 0.0–7.0)
Eosinophils Absolute: 0.2 10*3/uL (ref 0.0–0.5)
HCT: 37.1 % (ref 34.8–46.6)
HEMOGLOBIN: 12.4 g/dL (ref 11.6–15.9)
LYMPH#: 1.5 10*3/uL (ref 0.9–3.3)
LYMPH%: 23.7 % (ref 14.0–49.7)
MCH: 28.5 pg (ref 25.1–34.0)
MCHC: 33.5 g/dL (ref 31.5–36.0)
MCV: 85 fL (ref 79.5–101.0)
MONO#: 0.4 10*3/uL (ref 0.1–0.9)
MONO%: 6.3 % (ref 0.0–14.0)
NEUT#: 4 10*3/uL (ref 1.5–6.5)
NEUT%: 65.4 % (ref 38.4–76.8)
Platelets: 288 10*3/uL (ref 145–400)
RBC: 4.36 10*6/uL (ref 3.70–5.45)
RDW: 14.8 % — ABNORMAL HIGH (ref 11.2–14.5)
WBC: 6.2 10*3/uL (ref 3.9–10.3)

## 2014-01-12 MED ORDER — SODIUM CHLORIDE 0.9 % IV SOLN
6.0000 mg/kg | Freq: Once | INTRAVENOUS | Status: AC
  Administered 2014-01-12: 399 mg via INTRAVENOUS
  Filled 2014-01-12: qty 19

## 2014-01-12 MED ORDER — SODIUM CHLORIDE 0.9 % IJ SOLN
10.0000 mL | INTRAMUSCULAR | Status: DC | PRN
  Administered 2014-01-12: 10 mL
  Filled 2014-01-12: qty 10

## 2014-01-12 MED ORDER — ACETAMINOPHEN 325 MG PO TABS
650.0000 mg | ORAL_TABLET | Freq: Once | ORAL | Status: AC
  Administered 2014-01-12: 650 mg via ORAL

## 2014-01-12 MED ORDER — DIPHENHYDRAMINE HCL 25 MG PO CAPS
50.0000 mg | ORAL_CAPSULE | Freq: Once | ORAL | Status: AC
  Administered 2014-01-12: 50 mg via ORAL

## 2014-01-12 MED ORDER — HEPARIN SOD (PORK) LOCK FLUSH 100 UNIT/ML IV SOLN
500.0000 [IU] | Freq: Once | INTRAVENOUS | Status: AC | PRN
  Administered 2014-01-12: 500 [IU]
  Filled 2014-01-12: qty 5

## 2014-01-12 MED ORDER — SODIUM CHLORIDE 0.9 % IV SOLN
Freq: Once | INTRAVENOUS | Status: AC
  Administered 2014-01-12: 15:00:00 via INTRAVENOUS

## 2014-01-12 MED ORDER — DIPHENHYDRAMINE HCL 25 MG PO CAPS
ORAL_CAPSULE | ORAL | Status: AC
  Filled 2014-01-12: qty 2

## 2014-01-12 MED ORDER — ACETAMINOPHEN 325 MG PO TABS
ORAL_TABLET | ORAL | Status: AC
  Filled 2014-01-12: qty 2

## 2014-01-12 NOTE — Progress Notes (Signed)
Insurance is paying hercp 100%

## 2014-01-12 NOTE — Patient Instructions (Signed)
[facility] Discharge Instructions for Patients Receiving Chemotherapy  Today you received the following chemotherapy agents Herceptin  To help prevent nausea and vomiting after your treatment, we encourage you to take your nausea medication     If you develop nausea and vomiting that is not controlled by your nausea medication, call the clinic.   BELOW ARE SYMPTOMS THAT SHOULD BE REPORTED IMMEDIATELY:  *FEVER GREATER THAN 100.5 F  *CHILLS WITH OR WITHOUT FEVER  NAUSEA AND VOMITING THAT IS NOT CONTROLLED WITH YOUR NAUSEA MEDICATION  *UNUSUAL SHORTNESS OF BREATH  *UNUSUAL BRUISING OR BLEEDING  TENDERNESS IN MOUTH AND THROAT WITH OR WITHOUT PRESENCE OF ULCERS  *URINARY PROBLEMS  *BOWEL PROBLEMS  UNUSUAL RASH Items with * indicate a potential emergency and should be followed up as soon as possible.  Feel free to call the clinic you have any questions or concerns. The clinic phone number is [phone].    

## 2014-01-26 ENCOUNTER — Encounter: Payer: Self-pay | Admitting: Gastroenterology

## 2014-01-26 ENCOUNTER — Ambulatory Visit (INDEPENDENT_AMBULATORY_CARE_PROVIDER_SITE_OTHER): Payer: BC Managed Care – PPO | Admitting: Gastroenterology

## 2014-01-26 VITALS — BP 96/70 | HR 76 | Ht 66.0 in | Wt 156.0 lb

## 2014-01-26 DIAGNOSIS — Z1211 Encounter for screening for malignant neoplasm of colon: Secondary | ICD-10-CM

## 2014-01-26 MED ORDER — MOVIPREP 100 G PO SOLR: 1.0000 | Freq: Once | ORAL | Status: DC

## 2014-01-26 NOTE — Patient Instructions (Signed)
You will be set up for a colonoscopy for increased risk of colon cancer. Will discuss with cancer team at [facility] about timing, interval of future colon cancer screening.

## 2014-01-26 NOTE — Progress Notes (Signed)
HPI: This is a very pleasant 48 year old woman whom I am meeting for the first time today.  She was recently diagnosed with rest cancer, unilateral. She underwent double mastectomy.    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  Also increases risk for colon cancer.  She was told by genetics counselor that this increases her risk for colon cancer. She was also told by another provider that she should have colon cancer screening every year with colonoscopy.  No gi symptoms.  No FH of colon cancer.  Review of systems: Pertinent positive and negative review of systems were noted in the above HPI section. Complete review of systems was performed and was otherwise normal.    Past Medical History  Diagnosis Date  . Anxiety   . Depression   . Dyslipidemia   . Anxiety and depression 07/06/2011  . Hx of insertion of insulin pump ~ 2001 to present (06/09/2013)  . PONV (postoperative nausea and vomiting)   . Venous insufficiency   . Lower extremity edema   . Diabetes mellitus     IDDM  . Type I diabetes mellitus     uses insulin pump  . Breast cancer 01/01/13    /metastatic 1/1 lymph nodes    Past Surgical History  Procedure Laterality Date  . Lumbar disc surgery  2009    Dr Maxie Better  . Cesarean section  1989  . Tubal ligation  1991  . Breast biopsy Right 01/01/13    Invasive mammary ca,metastatic in 1/1 lymph node  . Breast biopsy Left 01/09/13    Fibroadenoma,microcalcifications  . Portacath placement Left 01/2013  . Mastectomy modified radical Right 06/09/2013  . Mastectomy complete / simple Left 06/09/2013  . Mastectomy modified radical Right 06/09/2013    Procedure: RIGHT MASTECTOMY MODIFIED RADICAL;  Surgeon: Adin Hector, MD;  Location: [place];  Service: General;  Laterality: Right;  . Simple mastectomy with axillary sentinel node biopsy Left 06/09/2013    Procedure: LEFT SIMPLE MASTECTOMY;  Surgeon: Adin Hector, MD;  Location: [place];  Service:  General;  Laterality: Left;    Current Outpatient Prescriptions  Medication Sig Dispense Refill  . cyanocobalamin 500 MCG tablet Take 500 mcg by mouth daily.      Marland Kitchen glucose blood (NOVA MAX TEST) test strip Use as directed up to 8 times a day      . insulin aspart (NOVOLOG) 100 UNIT/ML injection Inject as directed into Insulin pump, total of 40 units/day  20 mL  11  . Insulin Infusion Pump (MINIMED INSULIN PUMP) DEVI Inject 1 Units/hr into the vein continuous. Pt give extra per cards at meal times. Basic meals are about 5 units      . Insulin Infusion Pump Supplies (MINIMED INFUSION SET-MMT 397) MISC 1 Device by Does not apply route every 3 (three) days.      . Insulin Infusion Pump Supplies (PARADIGM PUMP RESERVOIR 1.76ML) MISC 1 Device by Does not apply route every 3 (three) days.       No current facility-administered medications for this visit.    Allergies as of 01/26/2014  . (No Known Allergies)    Family History  Problem Relation Age of Onset  . Cancer Neg Hx   . Diabetes Neg Hx   . Coronary artery disease Mother   . Congestive Heart Failure Maternal Grandmother   . Brain cancer Maternal Grandfather     dx in his early 10s    History  Social History  . Marital Status: Married    Spouse Name: N/A    Number of Children: 2  . Years of Education: N/A   Occupational History  .     Social History Main Topics  . Smoking status: Never Smoker   . Smokeless tobacco: Never Used  . Alcohol Use: No  . Drug Use: No  . Sexual Activity: Yes   Other Topics Concern  . Not on file   Social History Narrative  . No narrative on file       Physical Exam: BP 96/70  Pulse 76  Ht 5' 6"  (1.676 m)  Wt 156 lb (70.761 kg)  BMI 25.19 kg/m2 Constitutional: generally well-appearing Psychiatric: alert and oriented x3 Eyes: extraocular movements intact Mouth: oral pharynx moist, no lesions Neck: supple no lymphadenopathy Cardiovascular: heart regular rate and rhythm Lungs:  clear to auscultation bilaterally Abdomen: soft, nontender, nondistended, no obvious ascites, no peritoneal signs, normal bowel sounds Extremities: no lower extremity edema bilaterally Skin: no lesions on visible extremities    Assessment and plan: 48 y.o. female with  increased risk for colon cancer given her CHEK2 mutation  I am not familiar with the genetic mutation described above. I do understand however from the note by her genetic counselor that this does increase her risk for colon cancer somewhat. We'll proceed with colonoscopy at her soonest convenience. She is almost 48 years old anyway and routine screening begins at age 89. She did tell me however that she understands that she will need colonoscopy annually. I am going to double check with the new genetic counselors at [facility] if that is truly the recommendation, it seems to be quite a lot of colonoscopies and one slice time. There are certain circumstances where this is the recommendation such as leg syndrome, familial adenomatous polyposis syndrome.

## 2014-01-29 ENCOUNTER — Encounter: Payer: Self-pay | Admitting: Gastroenterology

## 2014-01-31 ENCOUNTER — Telehealth: Payer: Self-pay | Admitting: Gastroenterology

## 2014-01-31 NOTE — Telephone Encounter (Signed)
Hello Dr. Ardis Hughs and Ms. Dixon,  We do not recommend annual colonoscopy for CHEK2 mutations. Although there is thought to be an elevated colorectal cancer risk with these mutations, there are no guidelines on how often these individuals should be screened and most would agree the elevated risk does not warrant annual screening.   At this point in time, we say individuals should have screening 10 years before the earliest colorectal cancer diagnosis in the family, and if there is no family history of colorectal cancer, it would be reasonable to begin screening early (possibly age 59) although again there is no consensus. As for frequency, a 3-5 year follow up protocol would be reasonable for now, although this may still be too often. Screening guidelines are bound to change as we learn more, but unfortuntaely, right now we just don't know enough for there to be a national guideline on this.   I hope this helps. Below is a resource that you may also find helpful. Please let me know if I can answer any other questions.   Charmaine Downs  [phone]

## 2014-02-02 ENCOUNTER — Other Ambulatory Visit: Payer: BC Managed Care – PPO

## 2014-02-02 ENCOUNTER — Ambulatory Visit: Payer: BC Managed Care – PPO | Admitting: Adult Health

## 2014-02-02 ENCOUNTER — Other Ambulatory Visit: Payer: Self-pay | Admitting: *Deleted

## 2014-02-02 ENCOUNTER — Ambulatory Visit (HOSPITAL_BASED_OUTPATIENT_CLINIC_OR_DEPARTMENT_OTHER): Payer: BC Managed Care – PPO

## 2014-02-02 ENCOUNTER — Other Ambulatory Visit (HOSPITAL_BASED_OUTPATIENT_CLINIC_OR_DEPARTMENT_OTHER): Payer: BC Managed Care – PPO

## 2014-02-02 ENCOUNTER — Telehealth: Payer: Self-pay | Admitting: *Deleted

## 2014-02-02 VITALS — BP 115/69 | HR 87 | Temp 98.3°F | Resp 18

## 2014-02-02 DIAGNOSIS — C50419 Malignant neoplasm of upper-outer quadrant of unspecified female breast: Secondary | ICD-10-CM

## 2014-02-02 DIAGNOSIS — C773 Secondary and unspecified malignant neoplasm of axilla and upper limb lymph nodes: Secondary | ICD-10-CM

## 2014-02-02 DIAGNOSIS — C50911 Malignant neoplasm of unspecified site of right female breast: Secondary | ICD-10-CM

## 2014-02-02 DIAGNOSIS — Z5112 Encounter for antineoplastic immunotherapy: Secondary | ICD-10-CM

## 2014-02-02 LAB — CBC WITH DIFFERENTIAL/PLATELET
BASO%: 0.8 % (ref 0.0–2.0)
BASOS ABS: 0.1 10*3/uL (ref 0.0–0.1)
EOS ABS: 0.2 10*3/uL (ref 0.0–0.5)
EOS%: 2.7 % (ref 0.0–7.0)
HCT: 36 % (ref 34.8–46.6)
HEMOGLOBIN: 12 g/dL (ref 11.6–15.9)
LYMPH%: 21.1 % (ref 14.0–49.7)
MCH: 28.5 pg (ref 25.1–34.0)
MCHC: 33.3 g/dL (ref 31.5–36.0)
MCV: 85.8 fL (ref 79.5–101.0)
MONO#: 0.5 10*3/uL (ref 0.1–0.9)
MONO%: 6.8 % (ref 0.0–14.0)
NEUT#: 5.5 10*3/uL (ref 1.5–6.5)
NEUT%: 68.6 % (ref 38.4–76.8)
PLATELETS: 295 10*3/uL (ref 145–400)
RBC: 4.2 10*6/uL (ref 3.70–5.45)
RDW: 14.9 % — AB (ref 11.2–14.5)
WBC: 8 10*3/uL (ref 3.9–10.3)
lymph#: 1.7 10*3/uL (ref 0.9–3.3)

## 2014-02-02 LAB — COMPREHENSIVE METABOLIC PANEL
ALT: 10 U/L (ref 0–35)
AST: 15 U/L (ref 0–37)
Albumin: 3.5 g/dL (ref 3.5–5.2)
Alkaline Phosphatase: 117 U/L (ref 39–117)
BILIRUBIN TOTAL: 0.4 mg/dL (ref 0.2–1.2)
BUN: 16 mg/dL (ref 6–23)
CO2: 27 mEq/L (ref 19–32)
CREATININE: 0.78 mg/dL (ref 0.50–1.10)
Calcium: 9.2 mg/dL (ref 8.4–10.5)
Chloride: 96 mEq/L (ref 96–112)
GLUCOSE: 157 mg/dL — AB (ref 70–99)
POTASSIUM: 4.2 meq/L (ref 3.5–5.3)
Sodium: 136 mEq/L (ref 135–145)
Total Protein: 7 g/dL (ref 6.0–8.3)

## 2014-02-02 MED ORDER — SODIUM CHLORIDE 0.9 % IJ SOLN
10.0000 mL | INTRAMUSCULAR | Status: DC | PRN
  Administered 2014-02-02: 10 mL
  Filled 2014-02-02: qty 10

## 2014-02-02 MED ORDER — HEPARIN SOD (PORK) LOCK FLUSH 100 UNIT/ML IV SOLN
500.0000 [IU] | Freq: Once | INTRAVENOUS | Status: AC | PRN
  Administered 2014-02-02: 500 [IU]
  Filled 2014-02-02: qty 5

## 2014-02-02 MED ORDER — ACETAMINOPHEN 325 MG PO TABS
650.0000 mg | ORAL_TABLET | Freq: Once | ORAL | Status: AC
  Administered 2014-02-02: 650 mg via ORAL

## 2014-02-02 MED ORDER — DIPHENHYDRAMINE HCL 25 MG PO CAPS
50.0000 mg | ORAL_CAPSULE | Freq: Once | ORAL | Status: AC
  Administered 2014-02-02: 50 mg via ORAL

## 2014-02-02 MED ORDER — DIPHENHYDRAMINE HCL 25 MG PO CAPS
ORAL_CAPSULE | ORAL | Status: AC
  Filled 2014-02-02: qty 2

## 2014-02-02 MED ORDER — SODIUM CHLORIDE 0.9 % IV SOLN
Freq: Once | INTRAVENOUS | Status: AC
Start: 2014-02-02 — End: 2014-02-02
  Administered 2014-02-02: 16:00:00 via INTRAVENOUS

## 2014-02-02 MED ORDER — ACETAMINOPHEN 325 MG PO TABS
ORAL_TABLET | ORAL | Status: AC
  Filled 2014-02-02: qty 2

## 2014-02-02 MED ORDER — TRASTUZUMAB CHEMO INJECTION 440 MG
6.0000 mg/kg | Freq: Once | INTRAVENOUS | Status: AC
  Administered 2014-02-02: 399 mg via INTRAVENOUS
  Filled 2014-02-02: qty 19

## 2014-02-02 NOTE — Telephone Encounter (Signed)
I have scheduled her next port flush appts. Patient given calendar. Patient made request to have Dr. Jana Hakim, I have given the message to the scheduler.

## 2014-02-02 NOTE — Patient Instructions (Signed)
[facility] Discharge Instructions for Patients Receiving Chemotherapy  Today you received the following chemotherapy agents :  Herceptin.  To help prevent nausea and vomiting after your treatment, we encourage you to take your nausea medication as instructed by your physician.   If you develop nausea and vomiting that is not controlled by your nausea medication, call the clinic.   BELOW ARE SYMPTOMS THAT SHOULD BE REPORTED IMMEDIATELY:  *FEVER GREATER THAN 100.5 F  *CHILLS WITH OR WITHOUT FEVER  NAUSEA AND VOMITING THAT IS NOT CONTROLLED WITH YOUR NAUSEA MEDICATION  *UNUSUAL SHORTNESS OF BREATH  *UNUSUAL BRUISING OR BLEEDING  TENDERNESS IN MOUTH AND THROAT WITH OR WITHOUT PRESENCE OF ULCERS  *URINARY PROBLEMS  *BOWEL PROBLEMS  UNUSUAL RASH Items with * indicate a potential emergency and should be followed up as soon as possible.  Feel free to call the clinic you have any questions or concerns. The clinic phone number is [phone].    

## 2014-02-09 ENCOUNTER — Other Ambulatory Visit: Payer: Self-pay | Admitting: *Deleted

## 2014-02-09 ENCOUNTER — Ambulatory Visit: Payer: BC Managed Care – PPO | Admitting: Endocrinology

## 2014-02-09 DIAGNOSIS — C50919 Malignant neoplasm of unspecified site of unspecified female breast: Secondary | ICD-10-CM

## 2014-02-09 MED ORDER — LIDOCAINE-PRILOCAINE 2.5-2.5 % EX CREA: 1.0000 "application " | TOPICAL_CREAM | CUTANEOUS | Status: DC | PRN

## 2014-02-11 ENCOUNTER — Telehealth: Payer: Self-pay

## 2014-02-11 ENCOUNTER — Telehealth: Payer: Self-pay | Admitting: Endocrinology

## 2014-02-11 NOTE — Telephone Encounter (Signed)
Called pt. She is requesting information about what her diet should be when she has her Colonoscopy in August. Pt is coming to office on 02/17/2014. Pt will address then

## 2014-02-11 NOTE — Telephone Encounter (Signed)
[facility] Mardene Celeste with Dr Loanne Drilling will send him a note to look at the insulin pump letter

## 2014-02-11 NOTE — Telephone Encounter (Signed)
Christian Mate called from Dr. Edison Nasuti office, stated she sent a letter June 16th for patient Jasmine Schwartz, no one has returned her call, she needs to have her insulin pump before August, is when she has her procedure done.

## 2014-02-11 NOTE — Telephone Encounter (Signed)
Message copied by Barron Alvine on Thu Feb 11, 2014  8:06 AM ------      Message from: LEWIS, Kendyn Zaman L      Created: Thu Feb 04, 2014  8:50 AM                   ----- Message -----         From: Barron Alvine, CMA         Sent: 02/04/2014           To: Barron Alvine, CMA            Waiting on insulin pump instruction ------

## 2014-02-17 ENCOUNTER — Telehealth: Payer: Self-pay | Admitting: Endocrinology

## 2014-02-17 ENCOUNTER — Ambulatory Visit (INDEPENDENT_AMBULATORY_CARE_PROVIDER_SITE_OTHER): Payer: BC Managed Care – PPO | Admitting: Endocrinology

## 2014-02-17 ENCOUNTER — Encounter: Payer: Self-pay | Admitting: Endocrinology

## 2014-02-17 VITALS — BP 122/74 | HR 88 | Temp 97.9°F | Ht 66.0 in | Wt 157.0 lb

## 2014-02-17 DIAGNOSIS — E109 Type 1 diabetes mellitus without complications: Secondary | ICD-10-CM

## 2014-02-17 LAB — MICROALBUMIN / CREATININE URINE RATIO
Creatinine,U: 196.4 mg/dL
Microalb Creat Ratio: 0.3 mg/g (ref 0.0–30.0)
Microalb, Ur: 0.5 mg/dL (ref 0.0–1.9)

## 2014-02-17 LAB — LIPID PANEL
CHOL/HDL RATIO: 2
CHOLESTEROL: 239 mg/dL — AB (ref 0–200)
HDL: 99.4 mg/dL (ref >39.00)
LDL Cholesterol: 125 mg/dL — ABNORMAL HIGH (ref 0–99)
NonHDL: 139.6
TRIGLYCERIDES: 73 mg/dL (ref 0.0–149.0)
VLDL: 14.6 mg/dL (ref 0.0–40.0)

## 2014-02-17 LAB — HEMOGLOBIN A1C: HEMOGLOBIN A1C: 7.8 % — AB (ref 4.6–6.5)

## 2014-02-17 LAB — TSH: TSH: 22.17 u[IU]/mL — ABNORMAL HIGH (ref 0.35–4.50)

## 2014-02-17 MED ORDER — INSULIN REGULAR HUMAN 100 UNIT/ML IJ SOLN: INTRAMUSCULAR | Status: DC

## 2014-02-17 MED ORDER — DULOXETINE HCL 30 MG PO CPEP: 30.0000 mg | ORAL_CAPSULE | Freq: Every day | ORAL | Status: DC

## 2014-02-17 MED ORDER — DULOXETINE HCL 30 MG PO CPEP
30.0000 mg | ORAL_CAPSULE | Freq: Every day | ORAL | Status: DC
Start: 2014-02-17 — End: 2014-02-17

## 2014-02-17 MED ORDER — BUPROPION HCL ER (XL) 150 MG PO TB24: 150.0000 mg | ORAL_TABLET | Freq: Every day | ORAL | Status: DC

## 2014-02-17 MED ORDER — LEVOTHYROXINE SODIUM 75 MCG PO TABS: 75.0000 ug | ORAL_TABLET | Freq: Every day | ORAL | Status: DC

## 2014-02-17 NOTE — Patient Instructions (Addendum)
continue basal rate of 0.95 units/hr, 24 hrs per day.   continue mealtime bolus of 1 unit/ 15 grams carbohydrate  continue correction bolus (which some people call "sensitivity," or "insulin sensitivity ratio," or just "isr") of 1 unit for each 50 by which your glucose exceeds 100).   check your blood sugar 4 times a day: before the 3 meals, and at bedtime.  also check if you have symptoms of your blood sugar being too high or too low.  please keep a record of the readings and bring it to your next appointment here.  You can write it on any piece of paper.  please call us sooner if your blood sugar goes below 70, or if you have a lot of readings over 200.   blood tests are being requested for you today.  We'll contact you with results.   Please come back for a regular physical appointment in 3 months.   i have sent a prescription to your pharmacy, for your symptoms.

## 2014-02-17 NOTE — Progress Notes (Signed)
Subjective:    Patient ID: Jasmine Schwartz, female    DOB: [birth year], 48 y.o.   MRN: [id number]  HPI Pt returns for f/u of type 1 DM (dx'ed 1990; she has mild if any neuropathy of the lower extremities.no known associated complications; on pump rx since 2004; she declined continuous glucose monitor, due to cost).  She has finisher rx for her breast cancer, for now.  pt states she feels well in general.  no cbg record, but states cbg's are much better since she finished cancer rx.   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  She has hot flashes and mood swings.   Past Medical History  Diagnosis Date  . Anxiety   . Depression   . Dyslipidemia   . Anxiety and depression 07/06/2011  . Hx of insertion of insulin pump ~ 2001 to present (06/09/2013)  . PONV (postoperative nausea and vomiting)   . Venous insufficiency   . Lower extremity edema   . Diabetes mellitus     IDDM  . Type I diabetes mellitus     uses insulin pump  . Breast cancer 01/01/13    /metastatic 1/1 lymph nodes    Past Surgical History  Procedure Laterality Date  . Lumbar disc surgery  2009    Dr Maxie Better  . Cesarean section  1989  . Tubal ligation  1991  . Breast biopsy Right 01/01/13    Invasive mammary ca,metastatic in 1/1 lymph node  . Breast biopsy Left 01/09/13    Fibroadenoma,microcalcifications  . Portacath placement Left 01/2013  . Mastectomy modified radical Right 06/09/2013  . Mastectomy complete / simple Left 06/09/2013  . Mastectomy modified radical Right 06/09/2013    Procedure: RIGHT MASTECTOMY MODIFIED RADICAL;  Surgeon: Adin Hector, MD;  Location: [facility];  Service: General;  Laterality: Right;  . Simple mastectomy with axillary sentinel node biopsy Left 06/09/2013    Procedure: LEFT SIMPLE MASTECTOMY;  Surgeon: Adin Hector, MD;  Location: [facility];  Service: General;  Laterality: Left;    History   Social History  . Marital Status: Married    Spouse Name: N/A    Number of Children: 2  . Years of  Education: N/A   Occupational History  .     Social History Main Topics  . Smoking status: Never Smoker   . Smokeless tobacco: Never Used  . Alcohol Use: No  . Drug Use: No  . Sexual Activity: Yes   Other Topics Concern  . Not on file   Social History Narrative  . No narrative on file    Current Outpatient Prescriptions on File Prior to Visit  Medication Sig Dispense Refill  . cyanocobalamin 500 MCG tablet Take 500 mcg by mouth daily.      Marland Kitchen glucose blood (NOVA MAX TEST) test strip Use as directed up to 8 times a day      . Insulin Infusion Pump (MINIMED INSULIN PUMP) DEVI Inject 1 Units/hr into the vein continuous. Pt give extra per cards at meal times. Basic meals are about 5 units      . Insulin Infusion Pump Supplies (MINIMED INFUSION SET-MMT 397) MISC 1 Device by Does not apply route every 3 (three) days.      . Insulin Infusion Pump Supplies (PARADIGM PUMP RESERVOIR 1.76ML) MISC 1 Device by Does not apply route every 3 (three) days.      Marland Kitchen lidocaine-prilocaine (EMLA) cream Apply 1 application topically as needed.  30 g  1  . MOVIPREP  100 G SOLR Take 1 kit (200 g total) by mouth once.  1 kit  0   No current facility-administered medications on file prior to visit.    No Known Allergies  Family History  Problem Relation Age of Onset  . Cancer Neg Hx   . Diabetes Neg Hx   . Coronary artery disease Mother   . Congestive Heart Failure Maternal Grandmother   . Brain cancer Maternal Grandfather     dx in his early 68s    BP 122/74  Pulse 88  Temp(Src) 97.9 F (36.6 C) (Oral)  Ht 5' 6" (1.676 m)  Wt 157 lb (71.215 kg)  BMI 25.35 kg/m2  SpO2 95%   Review of Systems No weight change.  She seldom has hypoglycemia, and these episodes are mild.      Objective:   Physical Exam VITAL SIGNS:  See vs page GENERAL: no distress Pulses: dorsalis pedis intact bilat.   Feet: no deformity. normal color and temp.  no edema Skin:  no ulcer on the feet.   Neuro: sensation  is intact to touch on the feet  Lab Results  Component Value Date   TSH 22.17* 02/17/2014   Lab Results  Component Value Date   HGBA1C 7.8* 02/17/2014   Lab Results  Component Value Date   CHOL 239* 02/17/2014   HDL 99.40 02/17/2014   LDLCALC 125* 02/17/2014   TRIG 73.0 02/17/2014   CHOLHDL 2 02/17/2014       Assessment & Plan:  Hypothyroidism, new DM: mild exacerbation Dyslipidemia: mild exacerbation Menopausal sxs, new  Patient is advised the following: Patient Instructions  continue basal rate of 0.95 units/hr, 24 hrs per day.   continue mealtime bolus of 1 unit/ 15 grams carbohydrate  continue correction bolus (which some people call "sensitivity," or "insulin sensitivity ratio," or just "isr") of 1 unit for each 50 by which your glucose exceeds 100).   check your blood sugar 4 times a day: before the 3 meals, and at bedtime.  also check if you have symptoms of your blood sugar being too high or too low.  please keep a record of the readings and bring it to your next appointment here.  You can write it on any piece of paper.  please call us sooner if your blood sugar goes below 70, or if you have a lot of readings over 200.   blood tests are being requested for you today.  We'll contact you with results.   Please come back for a regular physical appointment in 3 months.   i have sent a prescription to your pharmacy, for your symptoms.

## 2014-02-17 NOTE — Telephone Encounter (Signed)
Ok, i have sent a prescription to your pharmacy  

## 2014-02-17 NOTE — Telephone Encounter (Signed)
Called pt and advised her that Rx for Wellbutrin was sent to pharmacy in place of Cymbalta.

## 2014-02-17 NOTE — Telephone Encounter (Signed)
Pt would like to try the wellbutrin not the cymbalta

## 2014-02-17 NOTE — Telephone Encounter (Signed)
See below and please advise, Thanks!  

## 2014-02-23 ENCOUNTER — Telehealth: Payer: Self-pay

## 2014-02-23 MED ORDER — INSULIN REGULAR HUMAN 100 UNIT/ML IJ SOLN: INTRAMUSCULAR | Status: DC

## 2014-02-23 NOTE — Telephone Encounter (Signed)
Received request from pharmacy stating Humulin R is not covered. Pharmacy would like to change to Novolin R.  Ok to change? Thanks!

## 2014-02-23 NOTE — Telephone Encounter (Signed)
ok 

## 2014-02-23 NOTE — Telephone Encounter (Signed)
Rx changed and sent to pharmacy.  °

## 2014-02-25 ENCOUNTER — Telehealth: Payer: Self-pay

## 2014-02-25 NOTE — Telephone Encounter (Signed)
Message copied by Barron Alvine on Thu Feb 25, 2014  8:24 AM ------      Message from: Barron Alvine      Created: Thu Feb 11, 2014  9:01 AM       Waiting on Insulin pump instructions  ------

## 2014-02-25 NOTE — Telephone Encounter (Signed)
The pt had an appt on 02/17/14 and Dr Loanne Drilling gave her instructions on how to handle her pump for the procedure.  She will call with any questions or concerns

## 2014-03-08 ENCOUNTER — Telehealth (HOSPITAL_COMMUNITY): Payer: Self-pay | Admitting: Vascular Surgery

## 2014-03-09 ENCOUNTER — Encounter (HOSPITAL_COMMUNITY): Payer: Self-pay | Admitting: Vascular Surgery

## 2014-03-15 ENCOUNTER — Telehealth: Payer: Self-pay | Admitting: Endocrinology

## 2014-03-15 ENCOUNTER — Ambulatory Visit (AMBULATORY_SURGERY_CENTER): Payer: BC Managed Care – PPO | Admitting: Gastroenterology

## 2014-03-15 ENCOUNTER — Encounter: Payer: Self-pay | Admitting: Gastroenterology

## 2014-03-15 VITALS — BP 135/71 | HR 74 | Temp 96.8°F | Resp 20 | Ht 66.0 in | Wt 156.0 lb

## 2014-03-15 DIAGNOSIS — Z1509 Genetic susceptibility to other malignant neoplasm: Secondary | ICD-10-CM

## 2014-03-15 DIAGNOSIS — Z1211 Encounter for screening for malignant neoplasm of colon: Secondary | ICD-10-CM

## 2014-03-15 LAB — GLUCOSE, CAPILLARY
GLUCOSE-CAPILLARY: 192 mg/dL — AB (ref 70–99)
GLUCOSE-CAPILLARY: 177 mg/dL — AB (ref 70–99)
Glucose-Capillary: 183 mg/dL — ABNORMAL HIGH (ref 70–99)

## 2014-03-15 MED ORDER — SODIUM CHLORIDE 0.9 % IV SOLN: 500.0000 mL | INTRAVENOUS | Status: DC

## 2014-03-15 NOTE — Patient Instructions (Signed)
YOU HAD AN ENDOSCOPIC PROCEDURE TODAY AT [facility]: Refer to the procedure report that was given to you for any specific questions about what was found during the examination.  If the procedure report does not answer your questions, please call your gastroenterologist to clarify.  If you requested that your care partner not be given the details of your procedure findings, then the procedure report has been included in a sealed envelope for you to review at your convenience later.  YOU SHOULD EXPECT: Some feelings of bloating in the abdomen. Passage of more gas than usual.  Walking can help get rid of the air that was put into your GI tract during the procedure and reduce the bloating. If you had a lower endoscopy (such as a colonoscopy or flexible sigmoidoscopy) you may notice spotting of blood in your stool or on the toilet paper. If you underwent a bowel prep for your procedure, then you may not have a normal bowel movement for a few days.  DIET: Your first meal following the procedure should be a light meal and then it is ok to progress to your normal diet.  A half-sandwich or bowl of soup is an example of a good first meal.  Heavy or fried foods are harder to digest and may make you feel nauseous or bloated.  Likewise meals heavy in dairy and vegetables can cause extra gas to form and this can also increase the bloating.  Drink plenty of fluids but you should avoid alcoholic beverages for 24 hours.  ACTIVITY: Your care partner should take you home directly after the procedure.  You should plan to take it easy, moving slowly for the rest of the day.  You can resume normal activity the day after the procedure however you should NOT DRIVE or use heavy machinery for 24 hours (because of the sedation medicines used during the test).    SYMPTOMS TO REPORT IMMEDIATELY: A gastroenterologist can be reached at any hour.  During normal business hours, 8:30 AM to 5:00 PM Monday through Friday,  call [phone].  After hours and on weekends, please call the GI answering service at [phone] who will take a message and have the physician on call contact you.   Following lower endoscopy (colonoscopy or flexible sigmoidoscopy):  Excessive amounts of blood in the stool  Significant tenderness or worsening of abdominal pains  Swelling of the abdomen that is new, acute  Fever of 100F or higher  FOLLOW UP: Our staff will call the home number listed on your records the next business day following your procedure to check on you and address any questions or concerns that you may have at that time regarding the information given to you following your procedure. This is a courtesy call and so if there is no answer at the home number and we have not heard from you through the emergency physician on call, we will assume that you have returned to your regular daily activities without incident.  SIGNATURES/CONFIDENTIALITY: You and/or your care partner have signed paperwork which will be entered into your electronic medical record.  These signatures attest to the fact that that the information above on your After Visit Summary has been reviewed and is understood.  Full responsibility of the confidentiality of this discharge information lies with you and/or your care-partner.  Next colonoscopy in 5 years  Continue your normal medications

## 2014-03-15 NOTE — Telephone Encounter (Signed)
Patient wants to know if she should be taking thyroid medicine? She is not taking medication and is confused  Please advise   Thank you

## 2014-03-15 NOTE — Telephone Encounter (Signed)
Called Jasmine Schwartz and advised her Dr. Loanne Drilling wanted her to begin taking Thyroid medication due to her thyroid being abnormal from labs on 7/8.

## 2014-03-15 NOTE — Progress Notes (Signed)
Procedure ends, to recovery, report given and VSS. 

## 2014-03-15 NOTE — Op Note (Signed)
[facility]  [address] [place], [id number]   COLONOSCOPY PROCEDURE REPORT  PATIENT: Jasmine, Schwartz  MR#: [id number] BIRTHDATE: [birth year] , 47  yrs. old GENDER: Female ENDOSCOPIST: Milus Banister, MD REFERRED ZV:GJFT Rupert Stacks, M.D. PROCEDURE DATE:  03/15/2014 PROCEDURE:   Colonoscopy, screening First Screening Colonoscopy - Avg.  risk and is 50 yrs.  old or older - No.  Prior Negative Screening - Now for repeat screening. N/A  History of Adenoma - Now for follow-up colonoscopy & has been > or = to 3 yrs.  N/A  Polyps Removed Today? No.  Recommend repeat exam, <10 yrs? Yes.  High risk (family or personal hx). 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 Also increases risk for colon cancer.  MEDICATIONS: MAC sedation, administered by CRNA and propofol (Diprivan) 219m IV  DESCRIPTION OF PROCEDURE:   After the risks benefits and alternatives of the procedure were thoroughly explained, informed consent was obtained.  A digital rectal exam revealed no abnormalities of the rectum.   The LB [id number]-[id number] endoscope was introduced through the anus and advanced to the cecum, which was identified by both the appendix and ileocecal valve. No adverse events experienced.   The quality of the prep was excellent.  The instrument was then slowly withdrawn as the colon was fully examined.   COLON FINDINGS: A normal appearing cecum, ileocecal valve, and appendiceal orifice were identified.  The ascending, hepatic flexure, transverse, splenic flexure, descending, sigmoid colon and rectum appeared unremarkable.  No polyps or cancers were seen. Retroflexed views revealed no abnormalities. The time to cecum=3 minutes 09 seconds.  Withdrawal time=5 minutes 58 seconds.  The scope was withdrawn and the procedure completed. COMPLICATIONS: There were no complications.  ENDOSCOPIC IMPRESSION: Normal colon No  cancers or polyps  RECOMMENDATIONS: Given your CHECK2 mutation you should have colon cancer screening (colonoscopy) in 5 years.   eSigned:  DMilus Banister MD 03/15/2014 10:46 AM

## 2014-03-16 ENCOUNTER — Telehealth: Payer: Self-pay | Admitting: *Deleted

## 2014-03-16 NOTE — Telephone Encounter (Signed)
  Follow up Call-  Call back number 03/15/2014  Post procedure Call Back phone  # [phone]  Permission to leave phone message Yes     Patient questions:  Do you have a fever, pain , or abdominal swelling? No. Pain Score  0 *  Have you tolerated food without any problems? Yes.    Have you been able to return to your normal activities? Yes.    Do you have any questions about your discharge instructions: Diet   No. Medications  No. Follow up visit  No.  Do you have questions or concerns about your Care? No.  Actions: * If pain score is 4 or above: No action needed, pain <4.

## 2014-03-23 ENCOUNTER — Other Ambulatory Visit: Payer: Self-pay | Admitting: *Deleted

## 2014-03-23 DIAGNOSIS — C50919 Malignant neoplasm of unspecified site of unspecified female breast: Secondary | ICD-10-CM

## 2014-03-24 ENCOUNTER — Ambulatory Visit (HOSPITAL_BASED_OUTPATIENT_CLINIC_OR_DEPARTMENT_OTHER): Payer: BC Managed Care – PPO

## 2014-03-24 ENCOUNTER — Ambulatory Visit (HOSPITAL_BASED_OUTPATIENT_CLINIC_OR_DEPARTMENT_OTHER): Payer: BC Managed Care – PPO | Admitting: Adult Health

## 2014-03-24 ENCOUNTER — Other Ambulatory Visit (HOSPITAL_BASED_OUTPATIENT_CLINIC_OR_DEPARTMENT_OTHER): Payer: BC Managed Care – PPO

## 2014-03-24 ENCOUNTER — Encounter: Payer: Self-pay | Admitting: Adult Health

## 2014-03-24 ENCOUNTER — Telehealth: Payer: Self-pay | Admitting: Adult Health

## 2014-03-24 VITALS — BP 125/71 | HR 97 | Temp 98.1°F | Resp 18 | Ht 66.0 in | Wt 156.0 lb

## 2014-03-24 DIAGNOSIS — Z95828 Presence of other vascular implants and grafts: Secondary | ICD-10-CM

## 2014-03-24 DIAGNOSIS — C773 Secondary and unspecified malignant neoplasm of axilla and upper limb lymph nodes: Secondary | ICD-10-CM

## 2014-03-24 DIAGNOSIS — C50419 Malignant neoplasm of upper-outer quadrant of unspecified female breast: Secondary | ICD-10-CM

## 2014-03-24 DIAGNOSIS — C50911 Malignant neoplasm of unspecified site of right female breast: Secondary | ICD-10-CM

## 2014-03-24 DIAGNOSIS — C50919 Malignant neoplasm of unspecified site of unspecified female breast: Secondary | ICD-10-CM

## 2014-03-24 LAB — COMPREHENSIVE METABOLIC PANEL
ALBUMIN: 3.8 g/dL (ref 3.5–5.2)
ALK PHOS: 96 U/L (ref 39–117)
ALT: 12 U/L (ref 0–35)
AST: 16 U/L (ref 0–37)
BUN: 21 mg/dL (ref 6–23)
CO2: 26 mEq/L (ref 19–32)
Calcium: 9.5 mg/dL (ref 8.4–10.5)
Chloride: 98 mEq/L (ref 96–112)
Creatinine, Ser: 0.81 mg/dL (ref 0.50–1.10)
Glucose, Bld: 146 mg/dL — ABNORMAL HIGH (ref 70–99)
POTASSIUM: 4.2 meq/L (ref 3.5–5.3)
Sodium: 136 mEq/L (ref 135–145)
Total Bilirubin: 0.5 mg/dL (ref 0.2–1.2)
Total Protein: 7.6 g/dL (ref 6.0–8.3)

## 2014-03-24 LAB — CBC WITH DIFFERENTIAL/PLATELET
BASO%: 0.5 % (ref 0.0–2.0)
BASOS ABS: 0 10*3/uL (ref 0.0–0.1)
EOS ABS: 0.3 10*3/uL (ref 0.0–0.5)
EOS%: 4.5 % (ref 0.0–7.0)
HCT: 37.1 % (ref 34.8–46.6)
HEMOGLOBIN: 12.9 g/dL (ref 11.6–15.9)
LYMPH#: 1.7 10*3/uL (ref 0.9–3.3)
LYMPH%: 25.9 % (ref 14.0–49.7)
MCH: 29.3 pg (ref 25.1–34.0)
MCHC: 34.8 g/dL (ref 31.5–36.0)
MCV: 84.1 fL (ref 79.5–101.0)
MONO#: 0.5 10*3/uL (ref 0.1–0.9)
MONO%: 8.1 % (ref 0.0–14.0)
NEUT%: 61 % (ref 38.4–76.8)
NEUTROS ABS: 3.9 10*3/uL (ref 1.5–6.5)
Platelets: 299 10*3/uL (ref 145–400)
RBC: 4.41 10*6/uL (ref 3.70–5.45)
RDW: 13.2 % (ref 11.2–14.5)
WBC: 6.5 10*3/uL (ref 3.9–10.3)

## 2014-03-24 MED ORDER — HEPARIN SOD (PORK) LOCK FLUSH 100 UNIT/ML IV SOLN
500.0000 [IU] | Freq: Once | INTRAVENOUS | Status: AC
  Administered 2014-03-24: 500 [IU] via INTRAVENOUS
  Filled 2014-03-24: qty 5

## 2014-03-24 MED ORDER — SODIUM CHLORIDE 0.9 % IJ SOLN
10.0000 mL | INTRAMUSCULAR | Status: DC | PRN
  Administered 2014-03-24: 10 mL via INTRAVENOUS
  Filled 2014-03-24: qty 10

## 2014-03-24 NOTE — Telephone Encounter (Signed)
, °

## 2014-03-24 NOTE — Progress Notes (Addendum)
Hematology and Oncology Follow Up Visit  Jasmine Schwartz [id number] June 23, 1966 48 y.o. 03/24/2014 3:59 PM     Principal Diagnosis:Jasmine Schwartz Jasmine Schwartz 48 y.o. 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     Prior Therapy:  #1patient palpated a right breast mass in October 2013. At that time she had mammogram performed and it was negative. However her breast continue to increase in size. Initially patient felt that it could have been cysts or something on and therefore she did not go t o a physician. However subsequently patient's breast became quite painful and she also began to feel in knot under her right arm.because of this she went to her physician who did a physical exam and a mammogram was performed on 01/01/2013 the mammogram showed diffuse skin thickening generalized increased density within the right breast and enlarged right axillary lymph nodes. She went on to have an ultrasound of the right breast performed that showed a large spiculated hypoechoic mass in the right breast 11:00 2 cm from the nipple measuring at least 4.5 cm in size. There were abnormal lymph nodes with thickened cortex and the right axilla the largest one measuring 3.1 cm in dimension.   #2Patient was recommended ultrasound guided core biopsy of the right breast mass and the right axilla lymph node. The biopsy was performed on 01/01/2013. The right needle core biopsy of the primary mass showed invasive mammary carcinoma grade 2-3 was invasive ductal. The lymph node of the right axilla was positive for metastatic disease. The tumor was ER PR negative HER-2/neu positive with a Ki-67 of 76%. On 01/09/2013 patient had MRI of the breasts performed that showed diffuse right breast skin thickening and edema with right axillary lymph node. There was a 5 mm internal mammary lymph node that was incompletely imaged. The right breast mass measured 8 x 5 x 4.7 cm. There were abnormal areas involving all 4  quadrants. There was however no involvement of the pectoralis minor for lower chest wall. The left breast showed 2 areas of nodular enhancement these were biopsied and were fibroadenomas   #3 patient is status post Neoadjuvant with curative intent Taxotere carboplatinum Herceptin and perjeta starting 01/16/2013 - 05/01/2013.   #5 patient is status post bilateral mastectomies with the left breast revealing no evidence of cancer. Right breast reveals no residual carcinoma 14 right axillary lymph nodes were negative for metastatic disease.   #6 patient began adjuvant Herceptin starting 06/16/2013. Patient will complete out 1 year of adjuvant Herceptin.   #7 Patient is s/p adjuvant radiation therapy from 07/27/2013-09/11/2013.   Current therapy:  Herceptin every three weeks.    Interim History: Jasmine Schwartz 48 y.o. female with stage II HER-2/neu positive breast cancer.  She is doing very well today.  She tolerated her last two Herceptin treatments without difficulty.  She is ready to discuss having her port removed.  She denies fevers, chills, nausea, vomiting, constipation, diarrhea, numbness/tingling, skin changes, or any further concerns.    Medications:  Current Outpatient Prescriptions  Medication Sig Dispense Refill  . buPROPion (WELLBUTRIN XL) 150 MG 24 hr tablet Take 1 tablet (150 mg total) by mouth daily.  30 tablet  11  . glucose blood (NOVA MAX TEST) test strip Use as directed up to 8 times a day      . Insulin Infusion Pump (MINIMED INSULIN PUMP) DEVI Inject 1 Units/hr into the vein continuous. Pt give extra per cards at meal times. Basic meals are about  5 units      . Insulin Infusion Pump Supplies (MINIMED INFUSION SET-MMT 397) MISC 1 Device by Does not apply route every 3 (three) days.      . Insulin Infusion Pump Supplies (PARADIGM PUMP RESERVOIR 1.76ML) MISC 1 Device by Does not apply route every 3 (three) days.      Marland Kitchen levothyroxine (SYNTHROID, LEVOTHROID) 75 MCG tablet Take 75 mcg  by mouth daily before breakfast.      . lidocaine-prilocaine (EMLA) cream Apply 1 application topically as needed.  30 g  1  . NOVOLOG 100 UNIT/ML injection        No current facility-administered medications for this visit.     Allergies: No Known Allergies  Medical History: Past Medical History  Diagnosis Date  . Anxiety   . Depression   . Dyslipidemia   . Anxiety and depression 07/06/2011  . Hx of insertion of insulin pump ~ 2001 to present (06/09/2013)  . PONV (postoperative nausea and vomiting)   . Venous insufficiency   . Lower extremity edema   . Diabetes mellitus     IDDM  . Type I diabetes mellitus     uses insulin pump  . Breast cancer 01/01/13    /metastatic 1/1 lymph nodes    Surgical History:  Past Surgical History  Procedure Laterality Date  . Lumbar disc surgery  2009    Dr Maxie Better  . Cesarean section  1989  . Tubal ligation  1991  . Breast biopsy Right 01/01/13    Invasive mammary ca,metastatic in 1/1 lymph node  . Breast biopsy Left 01/09/13    Fibroadenoma,microcalcifications  . Portacath placement Left 01/2013  . Mastectomy modified radical Right 06/09/2013  . Mastectomy complete / simple Left 06/09/2013  . Mastectomy modified radical Right 06/09/2013    Procedure: RIGHT MASTECTOMY MODIFIED RADICAL;  Surgeon: Adin Hector, MD;  Location: [facility];  Service: General;  Laterality: Right;  . Simple mastectomy with axillary sentinel node biopsy Left 06/09/2013    Procedure: LEFT SIMPLE MASTECTOMY;  Surgeon: Adin Hector, MD;  Location: [facility];  Service: General;  Laterality: Left;     Review of Systems: A 10 point review of systems was conducted and is otherwise negative except for what is noted above.     Physical Exam: Blood pressure 125/71, pulse 97, temperature 98.1 F (36.7 C), temperature source Oral, resp. rate 18, height 5' 6"  (1.676 m), weight 156 lb (70.761 kg). GENERAL: Patient is a well appearing female in no acute distress HEENT:   Sclerae anicteric.  Oropharynx clear and moist. No ulcerations or evidence of oropharyngeal candidiasis. Neck is supple.  NODES:  No cervical, supraclavicular, or axillary lymphadenopathy palpated.  BREAST EXAM:  S/p bilateral mastectomy without reconstruction.  No swelling, no masses, benign bilateral breast exam.  LUNGS:  Clear to auscultation bilaterally.  No wheezes or rhonchi. HEART:  Regular rate and rhythm. No murmur appreciated. ABDOMEN:  Soft, nontender.  Positive, normoactive bowel sounds. No organomegaly palpated. MSK:  No focal spinal tenderness to palpation. Full range of motion bilaterally in the upper extremities. EXTREMITIES:  No peripheral edema.   SKIN:  Clear with no obvious rashes or skin changes. No nail dyscrasia. Left chest wall port with no erythema, swelling or heat.   NEURO:  Nonfocal. Well oriented.  Appropriate affect. ECOG PERFORMANCE STATUS: 0 - Asymptomatic   Lab Results: Lab Results  Component Value Date   WBC 6.5 03/24/2014   HGB 12.9 03/24/2014   HCT  37.1 03/24/2014   MCV 84.1 03/24/2014   PLT 299 03/24/2014     Chemistry      Component Value Date/Time   NA 136 03/24/2014 1457   NA 139 01/12/2014 1442   K 4.2 03/24/2014 1457   K 4.4 01/12/2014 1442   CL 98 03/24/2014 1457   CL 99 01/23/2013 1251   CO2 26 03/24/2014 1457   CO2 23 01/12/2014 1442   BUN 21 03/24/2014 1457   BUN 17.4 01/12/2014 1442   CREATININE 0.81 03/24/2014 1457   CREATININE 0.8 01/12/2014 1442      Component Value Date/Time   CALCIUM 9.5 03/24/2014 1457   CALCIUM 9.2 01/12/2014 1442   ALKPHOS 96 03/24/2014 1457   ALKPHOS 87 01/12/2014 1442   AST 16 03/24/2014 1457   AST 17 01/12/2014 1442   ALT 12 03/24/2014 1457   ALT 13 01/12/2014 1442   BILITOT 0.5 03/24/2014 1457   BILITOT 0.62 01/12/2014 1442      Assessment and Plan: Margretta Sidle 48 y.o. female with:  1. Stage II, HER-2/neu positive, invasive ductal carcinoma of the right breast.  Patient has underwent neoadjuvant treatment, followed by  bilateral mastectomies with a complete pathologic response.  She then completed a calendar year of Herceptin.  She is doing well and has no sign of recurrence.  Her labs are normal and I reviewed these with her in detail.  She will return in 3 months for follow up.     2. Cardiac:the patient was evaluated by Dr. Haroldine Laws on  12/16/13 along with an echocardiogram.  Her echocardiogram demonstrated a LVEF of 55%.   3.  The patient has a port in her left chest wall and I will contact Dr. Darrel Hoover office to have this removed.    The patient will return in 3 months for evaluation.  She knows to call us in the interim for any questions or concerns.  We can certainly see her sooner if needed.   I spent 25 minutes counseling the patient face to face.  The total time spent in the appointment was 30 minutes.  Minette Headland, NP Medical Oncology [facility] [phone] 03/24/2014 3:59 PM  Attending Note  I personally saw and examined Margretta Sidle. The plan of care was discussed with her. I agree with the assessment and plan as documented above. Plan: 1. stage II HER-2 positive invasive ductal carcinoma recommended a three-month followup for physical examination. 2. surveillance would be with physical exams only there is no indication for imaging since she had bilateral mastectomy. If she has any new symptoms will need to be analyzed and assessed but otherwise I do not recommend routine CT scans. 3. recommended physical activity including scheduled exercise and increasing fruits and vegetables. 4. Will request port removal  Signed Rulon Eisenmenger, MD

## 2014-03-24 NOTE — Patient Instructions (Signed)

## 2014-04-01 ENCOUNTER — Other Ambulatory Visit (INDEPENDENT_AMBULATORY_CARE_PROVIDER_SITE_OTHER): Payer: Self-pay | Admitting: General Surgery

## 2014-04-29 ENCOUNTER — Other Ambulatory Visit: Payer: Self-pay | Admitting: Oncology

## 2014-04-29 DIAGNOSIS — C50919 Malignant neoplasm of unspecified site of unspecified female breast: Secondary | ICD-10-CM

## 2014-05-03 ENCOUNTER — Telehealth: Payer: Self-pay | Admitting: Hematology and Oncology

## 2014-05-03 ENCOUNTER — Ambulatory Visit: Payer: BC Managed Care – PPO | Admitting: Hematology and Oncology

## 2014-05-03 NOTE — Telephone Encounter (Signed)
, °

## 2014-05-12 ENCOUNTER — Encounter (INDEPENDENT_AMBULATORY_CARE_PROVIDER_SITE_OTHER): Payer: BC Managed Care – PPO | Admitting: Surgery

## 2014-05-20 ENCOUNTER — Ambulatory Visit: Payer: BC Managed Care – PPO | Admitting: Endocrinology

## 2014-05-24 NOTE — Telephone Encounter (Signed)
Open in error

## 2014-05-28 ENCOUNTER — Other Ambulatory Visit: Payer: Self-pay

## 2014-06-08 ENCOUNTER — Telehealth: Payer: Self-pay | Admitting: Hematology and Oncology

## 2014-06-08 NOTE — Telephone Encounter (Signed)
returned pt call and confirmed NOV appt....pt ok and aware

## 2014-06-29 ENCOUNTER — Ambulatory Visit (HOSPITAL_BASED_OUTPATIENT_CLINIC_OR_DEPARTMENT_OTHER): Payer: 59 | Admitting: Hematology and Oncology

## 2014-06-29 ENCOUNTER — Telehealth: Payer: Self-pay | Admitting: Hematology and Oncology

## 2014-06-29 ENCOUNTER — Other Ambulatory Visit (HOSPITAL_BASED_OUTPATIENT_CLINIC_OR_DEPARTMENT_OTHER): Payer: 59

## 2014-06-29 ENCOUNTER — Other Ambulatory Visit: Payer: Self-pay

## 2014-06-29 VITALS — BP 121/67 | HR 88 | Temp 98.1°F | Resp 18 | Ht 66.0 in | Wt 158.0 lb

## 2014-06-29 DIAGNOSIS — C50919 Malignant neoplasm of unspecified site of unspecified female breast: Secondary | ICD-10-CM

## 2014-06-29 DIAGNOSIS — Z853 Personal history of malignant neoplasm of breast: Secondary | ICD-10-CM

## 2014-06-29 DIAGNOSIS — C50411 Malignant neoplasm of upper-outer quadrant of right female breast: Secondary | ICD-10-CM

## 2014-06-29 LAB — CBC WITH DIFFERENTIAL/PLATELET
BASO%: 0.9 % (ref 0.0–2.0)
Basophils Absolute: 0.1 10*3/uL (ref 0.0–0.1)
EOS%: 4 % (ref 0.0–7.0)
Eosinophils Absolute: 0.3 10*3/uL (ref 0.0–0.5)
HCT: 38.8 % (ref 34.8–46.6)
HEMOGLOBIN: 12.9 g/dL (ref 11.6–15.9)
LYMPH%: 25 % (ref 14.0–49.7)
MCH: 28.6 pg (ref 25.1–34.0)
MCHC: 33.3 g/dL (ref 31.5–36.0)
MCV: 85.9 fL (ref 79.5–101.0)
MONO#: 0.5 10*3/uL (ref 0.1–0.9)
MONO%: 6.9 % (ref 0.0–14.0)
NEUT#: 4.8 10*3/uL (ref 1.5–6.5)
NEUT%: 63.2 % (ref 38.4–76.8)
Platelets: 361 10*3/uL (ref 145–400)
RBC: 4.51 10*6/uL (ref 3.70–5.45)
RDW: 13.7 % (ref 11.2–14.5)
WBC: 7.7 10*3/uL (ref 3.9–10.3)
lymph#: 1.9 10*3/uL (ref 0.9–3.3)

## 2014-06-29 LAB — COMPREHENSIVE METABOLIC PANEL (CC13)
ALK PHOS: 98 U/L (ref 40–150)
ALT: 11 U/L (ref 0–55)
AST: 18 U/L (ref 5–34)
Albumin: 3.8 g/dL (ref 3.5–5.0)
Anion Gap: 7 mEq/L (ref 3–11)
BUN: 17 mg/dL (ref 7.0–26.0)
CALCIUM: 9.5 mg/dL (ref 8.4–10.4)
CO2: 29 mEq/L (ref 22–29)
Chloride: 101 mEq/L (ref 98–109)
Creatinine: 0.8 mg/dL (ref 0.6–1.1)
Glucose: 137 mg/dl (ref 70–140)
POTASSIUM: 3.6 meq/L (ref 3.5–5.1)
SODIUM: 137 meq/L (ref 136–145)
TOTAL PROTEIN: 7.1 g/dL (ref 6.4–8.3)
Total Bilirubin: 0.67 mg/dL (ref 0.20–1.20)

## 2014-06-29 NOTE — Assessment & Plan Note (Signed)
Stage II, HER-2/neu positive, invasive ductal carcinoma of the right breast. Patient has underwent neoadjuvant treatment, followed by bilateral mastectomies with a complete pathologic response. She then completed a calendar year of Herceptin. She is doing well and has no sign of recurrence.  Surveillance: The specimens that was normal without any abnormalities. She felt some fullness in the left anterior axillary fold which was examined and there was no abnormalities noted. We'll continue every six-month physical exam checkups on the chest wall for followup. I discussed with her that there is no indication for CT scans for surveillance.  Survivorship: Encouraged her to stay active and do physical exercise daily with walking at least 30 minutes. Encouraged eating more fruits and vegetables and less red meat. I also encouraged her to stay proactive and follow the surveillance plan for other cancers as recommended by her primary care physician.  Return to clinic in 6 months for follow

## 2014-06-29 NOTE — Progress Notes (Signed)
Patient Care Team: [name], MD as PCP - General [facility]. Rolena Infante, MD as Referring Physician (Obstetrics and Gynecology)  DIAGNOSIS: No matching staging information was found for the patient.  SUMMARY OF ONCOLOGIC HISTORY:   Breast cancer of upper-outer quadrant of right female breast   01/01/2013 Initial Diagnosis Mammogram showed diffuse skin thickening and enlarged right axillary lymph nodes with right breast density ultrasound showed 4.5 cm mass right axillary 3.1 cm biopsy showed IMC grade 2-3 lymph node also positive ER/PR neg HER-2 pos Ki-67 76%, MRI 8 cm mas   01/16/2013 - 05/01/2013 Neo-Adjuvant Chemotherapy Taxotere carboplatin Herceptin and Perjeta x6 cycles   05/29/2013 Surgery Bilateral mastectomies, left breasts no evidence of cancer right breast no evidence of cancer 40 lymph nodes negative complete pathologic response    - 02/02/2014 Chemotherapy Adjuvant Herceptin   07/27/2013 - 09/11/2013 Radiation Therapy Adjuvant radiation therapy    CHIEF COMPLIANT: followup of breast cancer  INTERVAL HISTORY: Jasmine Schwartz is a 48 year old Caucasian with above-mentioned history of HER-2 positive breast cancer treated with neoadjuvant chemotherapy followed by bilateral mastectomies with a complete pathologic response. She completed Herceptin maintenance therapy end of June 2015. She underwent radiation therapy as well. She is here for three-month followup visit she reports no problems or concerns. She has noticed some thickening of the breast and is wondering if he can do a physical exam today.  REVIEW OF SYSTEMS:   Constitutional: Denies fevers, chills or abnormal weight loss Eyes: Denies blurriness of vision Ears, nose, mouth, throat, and face: Denies mucositis or sore throat Respiratory: Denies cough, dyspnea or wheezes Cardiovascular: Denies palpitation, chest discomfort or lower extremity swelling Gastrointestinal:  Denies nausea, heartburn or change in bowel habits Skin: Denies abnormal  skin rashes Lymphatics: Denies new lymphadenopathy or easy bruising Neurological:Denies numbness, tingling or new weaknesses Behavioral/Psych: Mood is stable, no new changes  Breast: fullness and thickening in the breast All other systems were reviewed with the patient and are negative.  I have reviewed the past medical history, past surgical history, social history and family history with the patient and they are unchanged from previous note.  ALLERGIES:  has No Known Allergies.  MEDICATIONS:  Current Outpatient Prescriptions  Medication Sig Dispense Refill  . buPROPion (WELLBUTRIN XL) 150 MG 24 hr tablet Take 1 tablet (150 mg total) by mouth daily. 30 tablet 11  . fluconazole (DIFLUCAN) 200 MG tablet TAKE 1 TABLET BY MOUTH DAILY 5 tablet 0  . glucose blood (NOVA MAX TEST) test strip Use as directed up to 8 times a day    . Insulin Infusion Pump (MINIMED INSULIN PUMP) DEVI Inject 1 Units/hr into the vein continuous. Pt give extra per cards at meal times. Basic meals are about 5 units    . Insulin Infusion Pump Supplies (MINIMED INFUSION SET-MMT 397) MISC 1 Device by Does not apply route every 3 (three) days.    . Insulin Infusion Pump Supplies (PARADIGM PUMP RESERVOIR 1.76ML) MISC 1 Device by Does not apply route every 3 (three) days.    Marland Kitchen levothyroxine (SYNTHROID, LEVOTHROID) 75 MCG tablet Take 75 mcg by mouth daily before breakfast.    . lidocaine-prilocaine (EMLA) cream Apply 1 application topically as needed. 30 g 1  . NOVOLOG 100 UNIT/ML injection      No current facility-administered medications for this visit.    PHYSICAL EXAMINATION: ECOG PERFORMANCE STATUS: 0 - Asymptomatic  Filed Vitals:   06/29/14 1605  BP: 121/67  Pulse: 88  Temp: 98.1 F (36.7 C)  Resp: 18   Filed Weights   06/29/14 1605  Weight: 158 lb (71.668 kg)    GENERAL:alert, no distress and comfortable SKIN: skin color, texture, turgor are normal, no rashes or significant lesions EYES: normal,  Conjunctiva are pink and non-injected, sclera clear OROPHARYNX:no exudate, no erythema and lips, buccal mucosa, and tongue normal  NECK: supple, thyroid normal size, non-tender, without nodularity LYMPH:  no palpable lymphadenopathy in the cervical, axillary or inguinal LUNGS: clear to auscultation and percussion with normal breathing effort HEART: regular rate & rhythm and no murmurs and no lower extremity edema ABDOMEN:abdomen soft, non-tender and normal bowel sounds Musculoskeletal:no cyanosis of digits and no clubbing  NEURO: alert & oriented x 3 with fluent speech, no focal motor/sensory deficits BREAST: No palpable masses or nodules in either right or left chest wall. No palpable axillary supraclavicular or infraclavicular adenopathy   LABORATORY DATA:  I have reviewed the data as listed   Chemistry      Component Value Date/Time   NA 137 06/29/2014 1551   NA 136 03/24/2014 1457   K 3.6 06/29/2014 1551   K 4.2 03/24/2014 1457   CL 98 03/24/2014 1457   CL 99 01/23/2013 1251   CO2 29 06/29/2014 1551   CO2 26 03/24/2014 1457   BUN 17.0 06/29/2014 1551   BUN 21 03/24/2014 1457   CREATININE 0.8 06/29/2014 1551   CREATININE 0.81 03/24/2014 1457      Component Value Date/Time   CALCIUM 9.5 06/29/2014 1551   CALCIUM 9.5 03/24/2014 1457   ALKPHOS 98 06/29/2014 1551   ALKPHOS 96 03/24/2014 1457   AST 18 06/29/2014 1551   AST 16 03/24/2014 1457   ALT 11 06/29/2014 1551   ALT 12 03/24/2014 1457   BILITOT 0.67 06/29/2014 1551   BILITOT 0.5 03/24/2014 1457       Lab Results  Component Value Date   WBC 7.7 06/29/2014   HGB 12.9 06/29/2014   HCT 38.8 06/29/2014   MCV 85.9 06/29/2014   PLT 361 06/29/2014   NEUTROABS 4.8 06/29/2014    ASSESSMENT & PLAN:  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 Patient has underwent neoadjuvant treatment, followed by bilateral mastectomies  with a complete pathologic response. She then completed a calendar year of Herceptin. She is doing well and has no sign of recurrence.  Surveillance: The specimens that was normal without any abnormalities. She felt some fullness in the left anterior axillary fold which was examined and there was no abnormalities noted. We'll continue every six-month physical exam checkups on the chest wall for followup. I discussed with her that there is no indication for CT scans for surveillance.  Survivorship: Encouraged her to stay active and do physical exercise daily with walking at least 30 minutes. Encouraged eating more fruits and vegetables and less red meat. I also encouraged her to stay proactive and follow the surveillance plan for other cancers as recommended by her primary care physician.  Return to clinic in 6 months for follow    Orders Placed This Encounter  Procedures  . CBC with Differential    Standing Status: Future     Number of Occurrences:      Standing Expiration Date: 06/29/2015  . Comprehensive metabolic panel (Cmet) - [facility]    Standing Status: Future     Number of Occurrences:      Standing Expiration Date: 06/29/2015   The patient has a  good understanding of the overall plan. she agrees with it. She will call with any problems that may develop before her next visit here.   Rulon Eisenmenger, MD 06/29/2014 4:55 PM

## 2014-06-29 NOTE — Telephone Encounter (Signed)
per pof tosch pt appt-gace pt copy of sch

## 2014-07-13 ENCOUNTER — Encounter: Payer: Self-pay | Admitting: Nurse Practitioner

## 2014-07-13 ENCOUNTER — Ambulatory Visit (HOSPITAL_BASED_OUTPATIENT_CLINIC_OR_DEPARTMENT_OTHER): Payer: 59 | Admitting: Nurse Practitioner

## 2014-07-13 ENCOUNTER — Ambulatory Visit (HOSPITAL_COMMUNITY)
Admission: RE | Admit: 2014-07-13 | Discharge: 2014-07-13 | Disposition: A | Discharge status: Home / Self Care | Payer: 59 | Source: Ambulatory Visit | Attending: Hematology and Oncology | Admitting: Hematology and Oncology

## 2014-07-13 ENCOUNTER — Other Ambulatory Visit: Payer: Self-pay

## 2014-07-13 ENCOUNTER — Telehealth: Payer: Self-pay

## 2014-07-13 ENCOUNTER — Telehealth: Payer: Self-pay | Admitting: Hematology and Oncology

## 2014-07-13 ENCOUNTER — Other Ambulatory Visit: Payer: Self-pay | Admitting: *Deleted

## 2014-07-13 ENCOUNTER — Telehealth: Payer: Self-pay | Admitting: Nurse Practitioner

## 2014-07-13 VITALS — BP 124/68 | HR 86 | Temp 97.7°F | Resp 19 | Ht 66.0 in | Wt 160.0 lb

## 2014-07-13 DIAGNOSIS — C50411 Malignant neoplasm of upper-outer quadrant of right female breast: Secondary | ICD-10-CM | POA: Insufficient documentation

## 2014-07-13 DIAGNOSIS — M79609 Pain in unspecified limb: Secondary | ICD-10-CM | POA: Diagnosis not present

## 2014-07-13 DIAGNOSIS — C773 Secondary and unspecified malignant neoplasm of axilla and upper limb lymph nodes: Secondary | ICD-10-CM

## 2014-07-13 DIAGNOSIS — I890 Lymphedema, not elsewhere classified: Secondary | ICD-10-CM

## 2014-07-13 NOTE — Telephone Encounter (Signed)
per pof to sch pt sym mgmt @2 :15-pt aware per Karna Christmas

## 2014-07-13 NOTE — Progress Notes (Signed)
will   SYMPTOM MANAGEMENT CLINIC   HPI: Jasmine Schwartz 48 y.o. female diagnosed with breast cancer.  Patient is status post neoadjuvant chemotherapy with Taxotere/carboplatin/Herceptin/Perjeta.  Patient is also status post bilateral mastectomy and radiation therapy.   Patient is currently undergoing active surveillance only.  Patient called the cancer Center today requesting urgent care visit.  She states that she spent the this weekend cleaning her house, getting up her Christmas tree, and entertaining family-ended subsequently develop some right upper extremity edema and discomfort.  She denies any known injury or trauma to right arm.  Her left chest Port-A-Cath was removed earlier this year.  Patient denies any chest pain, chest pressure, shortness of breath, or pain with inspiration.  She denies any recent fevers or chills.   HPI  CURRENT THERAPY: No active treatment plan for patient.   ROS  Past Medical History  Diagnosis Date  . Anxiety   . Depression   . Dyslipidemia   . Anxiety and depression 07/06/2011  . Hx of insertion of insulin pump ~ 2001 to present (06/09/2013)  . PONV (postoperative nausea and vomiting)   . Venous insufficiency   . Lower extremity edema   . Diabetes mellitus     IDDM  . Type I diabetes mellitus     uses insulin pump  . Breast cancer 01/01/13    /metastatic 1/1 lymph nodes    Past Surgical History  Procedure Laterality Date  . Lumbar disc surgery  2009    Dr Maxie Better  . Cesarean section  1989  . Tubal ligation  1991  . Breast biopsy Right 01/01/13    Invasive mammary ca,metastatic in 1/1 lymph node  . Breast biopsy Left 01/09/13    Fibroadenoma,microcalcifications  . Portacath placement Left 01/2013  . Mastectomy modified radical Right 06/09/2013  . Mastectomy complete / simple Left 06/09/2013  . Mastectomy modified radical Right 06/09/2013    Procedure: RIGHT MASTECTOMY MODIFIED RADICAL;  Surgeon: Adin Hector, MD;  Location: [place];  Service:  General;  Laterality: Right;  . Simple mastectomy with axillary sentinel node biopsy Left 06/09/2013    Procedure: LEFT SIMPLE MASTECTOMY;  Surgeon: Adin Hector, MD;  Location: [place];  Service: General;  Laterality: Left;    has DIABETES MELLITUS, TYPE I; HYPERCHOLESTEROLEMIA; ANXIETY; DEPRESSION; URTICARIA; LEG PAIN, RIGHT; Encounter for long-term (current) use of other medications; Goiter; Anxiety and depression; Shortness of breath; Breast cancer of upper-outer quadrant of right female breast; Lower extremity edema; and Lymphedema of upper extremity on her problem list.     has No Known Allergies.    Medication List       This list is accurate as of: 07/13/14  3:21 PM.  Always use your most recent med list.               buPROPion 150 MG 24 hr tablet  Commonly known as:  WELLBUTRIN XL  Take 1 tablet (150 mg total) by mouth daily.     fluconazole 200 MG tablet  Commonly known as:  DIFLUCAN  TAKE 1 TABLET BY MOUTH DAILY     levothyroxine 75 MCG tablet  Commonly known as:  SYNTHROID, LEVOTHROID  Take 75 mcg by mouth daily before breakfast.     lidocaine-prilocaine cream  Commonly known as:  EMLA  Apply 1 application topically as needed.     MINIMED INFUSION SET-MMT 397 Misc  1 Device by Does not apply route every 3 (three) days.     PARADIGM PUMP  RESERVOIR 1.76ML Misc  1 Device by Does not apply route every 3 (three) days.     MINIMED INSULIN PUMP Devi  Inject 1 Units/hr into the vein continuous. Pt give extra per cards at meal times. Basic meals are about 5 units     NOVA MAX TEST test strip  Generic drug:  glucose blood  Use as directed up to 8 times a day     NOVOLOG 100 UNIT/ML injection  Generic drug:  insulin aspart         PHYSICAL EXAMINATION  Blood pressure 124/68, pulse 86, temperature 97.7 F (36.5 C), temperature source Oral, resp. rate 19, height 5\' 6"  (1.676 m), weight 160 lb (72.576 kg).  Physical Exam  Constitutional: She is oriented to  person, place, and time and well-developed, well-nourished, and in no distress.  HENT:  Head: Normocephalic and atraumatic.  Eyes: Conjunctivae and EOM are normal. Pupils are equal, round, and reactive to light. Right eye exhibits no discharge. Left eye exhibits no discharge. No scleral icterus.  Neck: Normal range of motion. Neck supple. No JVD present.  Pulmonary/Chest: Effort normal. No respiratory distress. She exhibits no tenderness.  Musculoskeletal: Normal range of motion. She exhibits edema. She exhibits no tenderness.  Patient has some mild right upper extremity lymphedema.  No evidence of erythema, warmth, tenderness, or red streaks.  Neurological: She is alert and oriented to person, place, and time. Gait normal.  Skin: Skin is dry. No rash noted. No erythema.  Psychiatric: Affect normal.  Nursing note and vitals reviewed.   LABORATORY DATA:. No visits with results within 3 Day(s) from this visit. Latest known visit with results is:  Appointment on 06/29/2014  Component Date Value Ref Range Status  . WBC 06/29/2014 7.7  3.9 - 10.3 10e3/uL Final  . NEUT# 06/29/2014 4.8  1.5 - 6.5 10e3/uL Final  . HGB 06/29/2014 12.9  11.6 - 15.9 g/dL Final  . HCT 06/29/2014 38.8  34.8 - 46.6 % Final  . Platelets 06/29/2014 361  145 - 400 10e3/uL Final  . MCV 06/29/2014 85.9  79.5 - 101.0 fL Final  . MCH 06/29/2014 28.6  25.1 - 34.0 pg Final  . MCHC 06/29/2014 33.3  31.5 - 36.0 g/dL Final  . RBC 06/29/2014 4.51  3.70 - 5.45 10e6/uL Final  . RDW 06/29/2014 13.7  11.2 - 14.5 % Final  . lymph# 06/29/2014 1.9  0.9 - 3.3 10e3/uL Final  . MONO# 06/29/2014 0.5  0.1 - 0.9 10e3/uL Final  . Eosinophils Absolute 06/29/2014 0.3  0.0 - 0.5 10e3/uL Final  . Basophils Absolute 06/29/2014 0.1  0.0 - 0.1 10e3/uL Final  . NEUT% 06/29/2014 63.2  38.4 - 76.8 % Final  . LYMPH% 06/29/2014 25.0  14.0 - 49.7 % Final  . MONO% 06/29/2014 6.9  0.0 - 14.0 % Final  . EOS% 06/29/2014 4.0  0.0 - 7.0 % Final  . BASO%  06/29/2014 0.9  0.0 - 2.0 % Final  . Sodium 06/29/2014 137  136 - 145 mEq/L Final  . Potassium 06/29/2014 3.6  3.5 - 5.1 mEq/L Final  . Chloride 06/29/2014 101  98 - 109 mEq/L Final  . CO2 06/29/2014 29  22 - 29 mEq/L Final  . Glucose 06/29/2014 137  70 - 140 mg/dl Final  . BUN 06/29/2014 17.0  7.0 - 26.0 mg/dL Final  . Creatinine 06/29/2014 0.8  0.6 - 1.1 mg/dL Final  . Total Bilirubin 06/29/2014 0.67  0.20 - 1.20 mg/dL Final  . Alkaline  Phosphatase 06/29/2014 98  40 - 150 U/L Final  . AST 06/29/2014 18  5 - 34 U/L Final  . ALT 06/29/2014 11  0 - 55 U/L Final  . Total Protein 06/29/2014 7.1  6.4 - 8.3 g/dL Final  . Albumin 06/29/2014 3.8  3.5 - 5.0 g/dL Final  . Calcium 06/29/2014 9.5  8.4 - 10.4 mg/dL Final  . Anion Gap 06/29/2014 7  3 - 11 mEq/L Final   Doppler US: No evidence of DVT or superficial thrombosis.   RADIOGRAPHIC STUDIES: No results found.  ASSESSMENT/PLAN:    Breast cancer of upper-outer quadrant of right female breast Patient is status post neoadjuvant Taxotere/carboplatin/Herceptin/Perjeta which was completed in September 2014.  Patient underwent a bilateral mastectomy in October 2014. Patient completed radiation therapy in January 2015. She completed adjuvant Herceptin in June 2015.  She is currently undergoing active surveillance only.  She will continue with follow up visits on an every six-month basis.  She has plans to return on 01/04/2015 for labs and a followup visit.  She is to call the cancer center with any new worries or concerns whatsoever.  Lymphedema of upper extremity Doppler ultrasound of the right upper extremity revealed no DVT or superficial thrombosis. Patient does have some mild right upper strength the lymphedema. Will order a lymphedema clinic referral; and also gave patient a prescription for a compression sleeve.  Patient stated understanding of all instructions; and was in agreement with this plan of care. The patient knows to call the clinic  with any problems, questions or concerns.   Review/collaboration with Dr. Lindi Adie regarding all aspects of patient's visit today.   Total time spent with patient was 25 minutes;  with greater than 80 percent of that time spent in face to face counseling regarding her symptoms, and coordination of care and follow up.  Disclaimer: This note was dictated with voice recognition software. Similar sounding words can inadvertently be transcribed and may not be corrected upon review.   Drue Second, NP 07/13/2014

## 2014-07-13 NOTE — Assessment & Plan Note (Signed)
Patient is status post neoadjuvant Taxotere/carboplatin/Herceptin/Perjeta which was completed in September 2014.  Patient underwent a bilateral mastectomy in October 2014. Patient completed radiation therapy in January 2015. She completed adjuvant Herceptin in June 2015.  She is currently undergoing active surveillance only.  She will continue with follow up visits on an every six-month basis.  She has plans to return on 01/04/2015 for labs and a followup visit.  She is to call the cancer center with any new worries or concerns whatsoever.

## 2014-07-13 NOTE — Progress Notes (Signed)
Right upper extremity venous duplex completed:  No evidence of DVT or superficial thrombosis.    

## 2014-07-13 NOTE — Progress Notes (Unsigned)
Pt called at [phone].

## 2014-07-13 NOTE — Telephone Encounter (Signed)
Pt called in requesting referral for lymphedema sleeve.  Reports swelling and pain in her right arm starting a week ago, denies change in color or temp.  Per Dr Lindi Adie, doppler and see Retta Mac.  Appt made for doppler at  [facility] at 130.  Appt made with Retta Mac.  Call rcvd from [facility] - doppler is negative.

## 2014-07-13 NOTE — Assessment & Plan Note (Signed)
Doppler ultrasound of the right upper extremity revealed no DVT or superficial thrombosis. Patient does have some mild right upper strength the lymphedema. Will order a lymphedema clinic referral; and also gave patient a prescription for a compression sleeve.

## 2014-07-13 NOTE — Telephone Encounter (Signed)
per pof to sch pt lymph-cld & sch and cld pt & spoke to her and gavwe ehr time & date of appt

## 2014-07-15 ENCOUNTER — Ambulatory Visit: Payer: 59 | Attending: Hematology and Oncology | Admitting: Physical Therapy

## 2014-07-15 DIAGNOSIS — I890 Lymphedema, not elsewhere classified: Secondary | ICD-10-CM | POA: Diagnosis not present

## 2014-07-15 DIAGNOSIS — C50911 Malignant neoplasm of unspecified site of right female breast: Secondary | ICD-10-CM | POA: Diagnosis not present

## 2014-07-15 DIAGNOSIS — E8989 Other postprocedural endocrine and metabolic complications and disorders: Secondary | ICD-10-CM

## 2014-07-15 NOTE — Therapy (Addendum)
[place], [place], [address] Phone: [phone]   Fax:  [phone]  Physical Therapy Evaluation  Patient Details  Name: Jasmine Schwartz MRN: [id number] Date of Birth: [birth year]  Encounter Date: 07/15/2014      PT End of Session - 07/15/14 1813    Visit Number 1   Number of Visits 8   Date for PT Re-Evaluation 08/12/14   PT Start Time 1350   PT Stop Time 1450   PT Time Calculation (min) 60 min   Activity Tolerance Patient tolerated treatment well   Behavior During Therapy [facility] for tasks assessed/performed      Past Medical History  Diagnosis Date  . Anxiety   . Depression   . Dyslipidemia   . Anxiety and depression 07/06/2011  . Hx of insertion of insulin pump ~ 2001 to present (06/09/2013)  . PONV (postoperative nausea and vomiting)   . Venous insufficiency   . Lower extremity edema   . Diabetes mellitus     IDDM  . Type I diabetes mellitus     uses insulin pump  . Breast cancer 01/01/13    /metastatic 1/1 lymph nodes    Past Surgical History  Procedure Laterality Date  . Lumbar disc surgery  2009    Dr Maxie Better  . Cesarean section  1989  . Tubal ligation  1991  . Breast biopsy Right 01/01/13    Invasive mammary ca,metastatic in 1/1 lymph node  . Breast biopsy Left 01/09/13    Fibroadenoma,microcalcifications  . Portacath placement Left 01/2013  . Mastectomy modified radical Right 06/09/2013  . Mastectomy complete / simple Left 06/09/2013  . Mastectomy modified radical Right 06/09/2013    Procedure: RIGHT MASTECTOMY MODIFIED RADICAL;  Surgeon: Adin Hector, MD;  Location: [place];  Service: General;  Laterality: Right;  . Simple mastectomy with axillary sentinel node biopsy Left 06/09/2013    Procedure: LEFT SIMPLE MASTECTOMY;  Surgeon: Adin Hector, MD;  Location: [place];  Service: General;  Laterality: Left;    There were no vitals taken for this visit.  Visit Diagnosis:  Lymphedema of upper  extremity following lymphadenectomy  Breast cancer, right      Subjective Assessment - 07/15/14 1802    Symptoms Pt thought she was coming her to get a compression sleeve, but agreed to phyiscal therapy evaluation  Swelling came on suddenly after using her arms alot to clean an put up a Christmas tree   Patient Stated Goals to reduce the swelling in her right arm.    Currently in Pain? No/denies  she reports a dull ache in her arm, not realy pain          [facility] PT Assessment - 07/15/14 1809    Assessment   Medical Diagnosis right arm lymphedema   Onset Date 07/09/14   Precautions   Precautions --  cancer   Restrictions   Weight Bearing Restrictions No   Balance Screen   Has the patient fallen in the past 6 months No   Has the patient had a decrease in activity level because of a fear of falling?  No   Is the patient reluctant to leave their home because of a fear of falling?  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 Right upper extremity from fingers and dorsal hand to lateral shoulder redirecting along  pathways.           Plan - 07/15/14 1815    Clinical Impression Statement mother present during eval and treatment.  New onset of  right arm lymphedema. Discussed complete decongestive therapy treatment, but pt does not want to have compression wrapping Received manual lymph drainage with beginning instruction for self care, issued klose training DVD to borrow for sef treatment at home and  provided tg soft for intital compression.  pt wil be limited iin visits because she works full time in [place]. She would benefit from slowly progressive strength training program to help prevent future lymphedema flare ups   Pt will benefit from skilled therapeutic intervention in order to improve on the following deficits Decreased strength;Increased edema;Decreased endurance   Rehab Potential Excellent   Clinical Impairments Affecting Rehab Potential 14 lymph nodes removed and radiation treatment   PT Frequency 2x / week   PT Duration 4 weeks   PT Treatment/Interventions Therapeutic exercise;Patient/family education;Compression bandaging;Manual techniques;Manual lymph drainage   PT Next Visit Plan review self manual lymph drainage. Teach remedial lymphatic exercise   Consulted and Agree with Plan of Care Patient;Family member/caregiver   Family Member Consulted mother              LYMPHEDEMA/ONCOLOGY QUESTIONNAIRE - 07/15/14 1412    Lymphedema Stage   Stage STAGE 2 SPONTANEOUSLY IRREVERSIBLE   Right Upper Extremity Lymphedema   15 cm Proximal to Olecranon Process 32 cm   10 cm Proximal to Olecranon Process 30.2 cm   Olecranon Process 25 cm   15 cm Proximal to Ulnar Styloid Process 26.5 cm   10 cm Proximal to Ulnar Styloid Process 21.7 cm   Just Proximal to Ulnar Styloid Process 15.1 cm   Across Hand at [employer] 17.5 cm   At [place] of 2nd Digit 5.8 cm   Left Upper Extremity Lymphedema   15 cm Proximal to Olecranon Process 29.5 cm   10 cm Proximal to Olecranon Process 27.1 cm    Olecranon Process 24 cm   15 cm Proximal to Ulnar Styloid Process 23.3 cm   10 cm Proximal to Ulnar Styloid Process 20.5 cm   Just Proximal to Ulnar Styloid Process 20.6 cm   Across Hand at [employer] 17 cm   At [place] of 2nd Digit 5.9 cm                 Quick Dash - 07/15/14 0001    Open a tight or new jar Moderate difficulty   Do heavy household chores (wash walls, wash floors) Moderate difficulty   Carry a shopping bag or briefcase Mild difficulty   Wash your back Unable   Use a knife to cut food Mild difficulty   Recreational activities in which you take some force or impact through your arm, shoulder, or hand (golf, hammering, tennis) Mild  difficulty   During the past week, to what extent has your arm, shoulder or hand problem interfered with your normal social activities with family, friends, neighbors, or groups? Slightly   During the past week, to what extent has your arm, shoulder or hand problem limited your work or other regular daily activities Not at all   Arm, shoulder, or hand pain. Moderate   Tingling (pins and needles) in your arm, shoulder, or hand None   Difficulty Sleeping Mild difficulty   DASH Score 34.09 %              Short Term Clinic Goals - 07/15/14 1821    CC Short Term Goal  #1   Title short term goals = long term goals         Long Term Clinic Goals - 07/15/14 1822    CC Long Term Goal  #1   Title pt will verbalize lymphedma risk reduction practices   Time 4   Period Weeks   Status New   CC Long Term Goal  #2   Title pt will verbalize understanding of manual lymph drainage, use of compression and exercise to manage arm lymphedema   Time 4   Period Weeks   Status New   CC Long Term Goal  #3   Title pt will have reduction of circumference of right arm by 1.0 cm at 15 cm proximal to the ulnar styloid   Time 4   Period Weeks   Status New   CC Long Term Goal  #4   Title pt will be independent in a home exercise program,    Time 4   Period Weeks   Status New        Problem List Patient Active Problem List   Diagnosis Date Noted  . Lymphedema of upper extremity 07/13/2014  . Lower extremity edema 05/27/2013  . Breast cancer of upper-outer quadrant of right female breast 01/01/2013  . Shortness of breath 03/31/2012  . Anxiety and depression 07/06/2011  . Encounter for long-term (current) use of other medications 03/23/2011  . Goiter 03/23/2011  . URTICARIA 03/23/2010  . HYPERCHOLESTEROLEMIA 10/25/2008  . LEG PAIN, RIGHT 02/04/2008  . DIABETES MELLITUS, TYPE I 03/20/2007  . ANXIETY 03/20/2007  . DEPRESSION 03/20/2007   Donato Heinz. Owens Shark, PT   07/15/2014, 6:25 PM      PHYSICAL THERAPY DISCHARGE SUMMARY  Visits from Start of Care: 1  Current functional level related to goals / functional outcomes: As above    Remaining deficits: As above    Education / Equipment: As above  Plan: Patient agrees to discharge.  Patient goals were not met. Patient is being discharged due to financial reasons.  ?????        Maudry Diego, PT 08/24/2015 9:10 AM

## 2014-07-29 ENCOUNTER — Ambulatory Visit: Payer: 59

## 2014-08-26 ENCOUNTER — Ambulatory Visit: Payer: 59 | Attending: Hematology and Oncology | Admitting: Physical Therapy

## 2014-08-26 DIAGNOSIS — C50911 Malignant neoplasm of unspecified site of right female breast: Secondary | ICD-10-CM | POA: Insufficient documentation

## 2014-08-26 DIAGNOSIS — I890 Lymphedema, not elsewhere classified: Secondary | ICD-10-CM | POA: Insufficient documentation

## 2014-08-26 DIAGNOSIS — E8989 Other postprocedural endocrine and metabolic complications and disorders: Secondary | ICD-10-CM | POA: Insufficient documentation

## 2014-08-30 ENCOUNTER — Telehealth: Payer: Self-pay | Admitting: Endocrinology

## 2014-08-30 NOTE — Telephone Encounter (Signed)
Patient stated that The Levothyroxine is giving her a rash, it is one of the side affect in the medication, she would like to know if you could prescribe her something else. Send to [facility]

## 2014-08-30 NOTE — Telephone Encounter (Signed)
See below and please advise, Thanks!  

## 2014-08-31 ENCOUNTER — Telehealth: Payer: Self-pay | Admitting: Endocrinology

## 2014-08-31 MED ORDER — LEVOTHYROXINE SODIUM 50 MCG PO TABS: ORAL_TABLET | ORAL | Status: DC

## 2014-08-31 NOTE — Telephone Encounter (Signed)
please call patient: i have sent a prescription to your pharmacy to change to 1 1/2 tabs of 50 mcg/day.  This pill is white, so has no coloring.

## 2014-08-31 NOTE — Telephone Encounter (Signed)
Lvom advising of note below. Requested call back to discuss if pt would like to discuss.

## 2014-08-31 NOTE — Telephone Encounter (Signed)
Pt is concerned about going back on the Levothyroxine. Pt states she has note been taking the medication since January 1st and her rash has gone away.  Please advise if pt is to start taking the Levothyroxine 60mcg.  Thanks!

## 2014-08-31 NOTE — Telephone Encounter (Signed)
If you want, you can stay off the thyroid pill, ans we can recheck the blood test with your next a1c

## 2014-08-31 NOTE — Telephone Encounter (Signed)
Patient states she was returning [facility] call   Thank you

## 2014-09-01 NOTE — Telephone Encounter (Signed)
Pt advised of note below and voiced understanding.  

## 2014-09-02 ENCOUNTER — Other Ambulatory Visit: Payer: Self-pay | Admitting: Endocrinology

## 2014-09-07 ENCOUNTER — Telehealth: Payer: Self-pay | Admitting: Endocrinology

## 2014-09-07 MED ORDER — INSULIN LISPRO 100 UNIT/ML ~~LOC~~ SOLN: SUBCUTANEOUS | Status: DC

## 2014-09-07 NOTE — Addendum Note (Signed)
Addended by: Moody Bruins E on: 09/07/2014 03:33 PM   Modules accepted: Orders

## 2014-09-07 NOTE — Telephone Encounter (Signed)
ok 

## 2014-09-07 NOTE — Telephone Encounter (Signed)
Pt's Novolog is no longer covered by insurance. Preferred is Humalog.  Ok to change? Thanks!

## 2014-09-07 NOTE — Telephone Encounter (Signed)
Patient need refill of Novalog it also need a prior Auth.

## 2014-09-07 NOTE — Telephone Encounter (Signed)
lvom advising pt we have changed her Novolog to Humalog due to insurance changed.  Requested call back if she would like to discuss.

## 2014-09-07 NOTE — Telephone Encounter (Signed)
Pt has questions regarding novolog i let her know it was called in on 09/02/14

## 2014-09-07 NOTE — Telephone Encounter (Signed)
Lvom advising pt that rx has been sent for novolog. Requested call back if pt would like to discuss.

## 2014-09-16 ENCOUNTER — Ambulatory Visit (INDEPENDENT_AMBULATORY_CARE_PROVIDER_SITE_OTHER): Payer: 59 | Admitting: Endocrinology

## 2014-09-16 ENCOUNTER — Encounter: Payer: Self-pay | Admitting: Endocrinology

## 2014-09-16 VITALS — BP 118/84 | HR 86 | Temp 98.7°F | Ht 66.0 in | Wt 161.0 lb

## 2014-09-16 DIAGNOSIS — E1042 Type 1 diabetes mellitus with diabetic polyneuropathy: Secondary | ICD-10-CM

## 2014-09-16 DIAGNOSIS — Z0189 Encounter for other specified special examinations: Secondary | ICD-10-CM

## 2014-09-16 DIAGNOSIS — Z0000 Encounter for general adult medical examination without abnormal findings: Secondary | ICD-10-CM | POA: Insufficient documentation

## 2014-09-16 LAB — MICROALBUMIN / CREATININE URINE RATIO
CREATININE, U: 174.9 mg/dL
Microalb Creat Ratio: 0.4 mg/g (ref 0.0–30.0)
Microalb, Ur: <0.7 mg/dL (ref 0.0–1.9)

## 2014-09-16 LAB — URINALYSIS, ROUTINE W REFLEX MICROSCOPIC
Bilirubin Urine: NEGATIVE
HGB URINE DIPSTICK: NEGATIVE
Ketones, ur: NEGATIVE
Leukocytes, UA: NEGATIVE
NITRITE: NEGATIVE
RBC / HPF: NONE SEEN (ref 0–?)
Specific Gravity, Urine: 1.02 (ref 1.000–1.030)
TOTAL PROTEIN, URINE-UPE24: NEGATIVE
Urine Glucose: 500 — AB
Urobilinogen, UA: 0.2 (ref 0.0–1.0)
pH: 6.5 (ref 5.0–8.0)

## 2014-09-16 LAB — HEMOGLOBIN A1C: Hgb A1c MFr Bld: 8.2 % — ABNORMAL HIGH (ref 4.6–6.5)

## 2014-09-16 LAB — LIPID PANEL
CHOL/HDL RATIO: 2
Cholesterol: 205 mg/dL — ABNORMAL HIGH (ref 0–200)
HDL: 97.4 mg/dL (ref >39.00)
LDL Cholesterol: 86 mg/dL (ref 0–99)
NONHDL: 107.6
TRIGLYCERIDES: 106 mg/dL (ref 0.0–149.0)
VLDL: 21.2 mg/dL (ref 0.0–40.0)

## 2014-09-16 LAB — TSH: TSH: 9.12 u[IU]/mL — AB (ref 0.35–4.50)

## 2014-09-16 MED ORDER — LEVOTHYROXINE SODIUM 50 MCG PO TABS: 50.0000 ug | ORAL_TABLET | Freq: Every day | ORAL | Status: DC

## 2014-09-16 NOTE — Progress Notes (Signed)
Subjective:    Patient ID: Jasmine Schwartz, female    DOB: [birth year], 49 y.o.   MRN: [id number]  HPI  Pt returns for f/u of diabetes mellitus: DM type: 1 Dx'ed: 5456 Complications: none Therapy: insulin since dx DKA: never Severe hypoglycemia: never Pancreatitis: never Other: she has been on pump rx since 2004; she declined continuous glucose monitor, due to cost; she has finisher rx for her breast cancer, for now Interval history: she takes these pump settings:  basal rate of 0.95 units/hr, 24 hrs per day.   mealtime bolus of 1 unit/ 15 grams carbohydrate  correction bolus (which some people call "sensitivity," or "insulin sensitivity ratio," or just "isr") of 1 unit for each 50 by which your glucose exceeds 100).  no cbg record, but states cbg's vary from 49-200's.  It is lowest after supper or in the afternoon, and highest in am.  She averages a total of approx 55 units per day. On synthroid, pt had a rash.  She has been off x 1 month.  Since then, the rash is better, but itching persists. Depression: wellbutrin works well.  Past Medical History  Diagnosis Date  . Anxiety   . Depression   . Dyslipidemia   . Anxiety and depression 07/06/2011  . Hx of insertion of insulin pump ~ 2001 to present (06/09/2013)  . PONV (postoperative nausea and vomiting)   . Venous insufficiency   . Lower extremity edema   . Diabetes mellitus     IDDM  . Type I diabetes mellitus     uses insulin pump  . Breast cancer 01/01/13    /metastatic 1/1 lymph nodes    Past Surgical History  Procedure Laterality Date  . Lumbar disc surgery  2009    Dr Maxie Better  . Cesarean section  1989  . Tubal ligation  1991  . Breast biopsy Right 01/01/13    Invasive mammary ca,metastatic in 1/1 lymph node  . Breast biopsy Left 01/09/13    Fibroadenoma,microcalcifications  . Portacath placement Left 01/2013  . Mastectomy modified radical Right 06/09/2013  . Mastectomy complete / simple Left 06/09/2013  . Mastectomy  modified radical Right 06/09/2013    Procedure: RIGHT MASTECTOMY MODIFIED RADICAL;  Surgeon: Adin Hector, MD;  Location: [place];  Service: General;  Laterality: Right;  . Simple mastectomy with axillary sentinel node biopsy Left 06/09/2013    Procedure: LEFT SIMPLE MASTECTOMY;  Surgeon: Adin Hector, MD;  Location: [place];  Service: General;  Laterality: Left;    History   Social History  . Marital Status: Married    Spouse Name: N/A    Number of Children: 2  . Years of Education: N/A   Occupational History  .     Social History Main Topics  . Smoking status: Never Smoker   . Smokeless tobacco: Never Used  . Alcohol Use: No  . Drug Use: No  . Sexual Activity: Yes   Other Topics Concern  . Not on file   Social History Narrative    Current Outpatient Prescriptions on File Prior to Visit  Medication Sig Dispense Refill  . buPROPion (WELLBUTRIN XL) 150 MG 24 hr tablet Take 1 tablet (150 mg total) by mouth daily. 30 tablet 11  . fluconazole (DIFLUCAN) 200 MG tablet TAKE 1 TABLET BY MOUTH DAILY 5 tablet 0  . glucose blood (NOVA MAX TEST) test strip Use as directed up to 8 times a day    . Insulin Infusion Pump (MINIMED  INSULIN PUMP) DEVI Inject 1 Units/hr into the vein continuous. Pt give extra per cards at meal times. Basic meals are about 5 units    . Insulin Infusion Pump Supplies (MINIMED INFUSION SET-MMT 397) MISC 1 Device by Does not apply route every 3 (three) days.    . Insulin Infusion Pump Supplies (PARADIGM PUMP RESERVOIR 1.76ML) MISC 1 Device by Does not apply route every 3 (three) days.    . insulin lispro (HUMALOG) 100 UNIT/ML injection Inject 40 units daily per insulin pump. 20 mL 2  . lidocaine-prilocaine (EMLA) cream Apply 1 application topically as needed. 30 g 1   No current facility-administered medications on file prior to visit.    No Known Allergies  Family History  Problem Relation Age of Onset  . Cancer Neg Hx   . Diabetes Neg Hx   .  Coronary artery disease Mother   . Congestive Heart Failure Maternal Grandmother   . Brain cancer Maternal Grandfather     dx in his early 53s    BP 118/84 mmHg  Pulse 86  Temp(Src) 98.7 F (37.1 C) (Oral)  Ht 5\' 6"  (1.676 m)  Wt 161 lb (73.029 kg)  BMI 26.00 kg/m2  SpO2 93%  Review of Systems Denies LOC.  She has gained weight since off the synthroid.      Objective:   Physical Exam VITAL SIGNS:  See vs page GENERAL: no distress Pulses: dorsalis pedis intact bilat.   MSK: no deformity of the feet CV: no leg edema Skin:  no ulcer on the feet.  normal color and temp on the feet. Neuro: sensation is intact to touch on the feet, but decreased from normal   Lab Results  Component Value Date   HGBA1C 8.2* 09/16/2014   Lab Results  Component Value Date   CHOL 205* 09/16/2014   HDL 97.40 09/16/2014   LDLCALC 86 09/16/2014   TRIG 106.0 09/16/2014   CHOLHDL 2 09/16/2014   Lab Results  Component Value Date   TSH 9.12* 09/16/2014      Assessment & Plan:  DM: moderate exacerbation Hypothyroidism: she should re-try the synthroid Dyslipidemia: well-controlled Depression: well-controlled.   Patient is advised the following: Patient Instructions  continue basal rate of 0.95 units/hr, except 1.2 units/hr, 3AM-6AM reduce mealtime bolus to 1 unit/ 13 grams carbohydrate  continue correction bolus (which some people call "sensitivity," or "insulin sensitivity ratio," or just "isr") of 1 unit for each 50 by which your glucose exceeds 100).   check your blood sugar 4 times a day: before the 3 meals, and at bedtime.  also check if you have symptoms of your blood sugar being too high or too low.  please keep a record of the readings and bring it to your next appointment here.  You can write it on any piece of paper.  please call us sooner if your blood sugar goes below 70, or if you have a lot of readings over 200.   blood tests are being requested for you today.  We'll contact you  with results.   If the thyroid is off again, please try the 50 mcg pill again.   Please come back for a regular physical appointment in 3 months.     please re-try the synthroid

## 2014-09-16 NOTE — Patient Instructions (Addendum)
continue basal rate of 0.95 units/hr, except 1.2 units/hr, 3AM-6AM reduce mealtime bolus to 1 unit/ 13 grams carbohydrate  continue correction bolus (which some people call "sensitivity," or "insulin sensitivity ratio," or just "isr") of 1 unit for each 50 by which your glucose exceeds 100).   check your blood sugar 4 times a day: before the 3 meals, and at bedtime.  also check if you have symptoms of your blood sugar being too high or too low.  please keep a record of the readings and bring it to your next appointment here.  You can write it on any piece of paper.  please call us sooner if your blood sugar goes below 70, or if you have a lot of readings over 200.   blood tests are being requested for you today.  We'll contact you with results.   If the thyroid is off again, please try the 50 mcg pill again.   Please come back for a regular physical appointment in 3 months.

## 2014-09-24 ENCOUNTER — Telehealth: Payer: Self-pay | Admitting: *Deleted

## 2014-09-24 NOTE — Telephone Encounter (Signed)
Patient finished treatment august 2015. Patient had no menstrual period since the beginning of chemotherapy. Patient started vaginal bleeding this AM. Patient was concerned if this vaginal bleeding was normal. Gyn visit would be recommended and information would be forwarded to oncology provider. Patient verbalized understanding.

## 2014-09-28 ENCOUNTER — Other Ambulatory Visit: Payer: Self-pay

## 2014-09-29 LAB — CYTOLOGY - PAP

## 2014-10-19 ENCOUNTER — Other Ambulatory Visit: Payer: Self-pay

## 2014-12-11 ENCOUNTER — Other Ambulatory Visit: Payer: Self-pay | Admitting: Endocrinology

## 2015-01-03 NOTE — Assessment & Plan Note (Signed)
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 Patient has underwent neoadjuvant treatment, followed by bilateral mastectomies with a complete pathologic response. She then completed a calendar year of Herceptin.  Breast Cancer Surveillance: 1. Breast exam 01/03/15: Normal chest wall exam 2. Mammogram: No role of imaging.   RTC in 1 year

## 2015-01-04 ENCOUNTER — Encounter: Payer: Self-pay | Admitting: Endocrinology

## 2015-01-04 ENCOUNTER — Other Ambulatory Visit (HOSPITAL_BASED_OUTPATIENT_CLINIC_OR_DEPARTMENT_OTHER): Payer: 59

## 2015-01-04 ENCOUNTER — Ambulatory Visit (HOSPITAL_BASED_OUTPATIENT_CLINIC_OR_DEPARTMENT_OTHER): Payer: 59 | Admitting: Hematology and Oncology

## 2015-01-04 ENCOUNTER — Ambulatory Visit (INDEPENDENT_AMBULATORY_CARE_PROVIDER_SITE_OTHER): Payer: 59 | Admitting: Endocrinology

## 2015-01-04 ENCOUNTER — Telehealth: Payer: Self-pay | Admitting: Hematology and Oncology

## 2015-01-04 VITALS — BP 124/82 | HR 115 | Temp 98.3°F | Ht 66.0 in | Wt 153.0 lb

## 2015-01-04 VITALS — BP 122/75 | HR 101 | Temp 98.1°F | Resp 18 | Ht 66.0 in | Wt 153.2 lb

## 2015-01-04 DIAGNOSIS — Z853 Personal history of malignant neoplasm of breast: Secondary | ICD-10-CM | POA: Diagnosis not present

## 2015-01-04 DIAGNOSIS — E039 Hypothyroidism, unspecified: Secondary | ICD-10-CM | POA: Diagnosis not present

## 2015-01-04 DIAGNOSIS — C50411 Malignant neoplasm of upper-outer quadrant of right female breast: Secondary | ICD-10-CM

## 2015-01-04 DIAGNOSIS — E1042 Type 1 diabetes mellitus with diabetic polyneuropathy: Secondary | ICD-10-CM

## 2015-01-04 DIAGNOSIS — C50919 Malignant neoplasm of unspecified site of unspecified female breast: Secondary | ICD-10-CM

## 2015-01-04 LAB — CBC WITH DIFFERENTIAL/PLATELET
BASO%: 0.3 % (ref 0.0–2.0)
BASOS ABS: 0 10*3/uL (ref 0.0–0.1)
EOS ABS: 0.1 10*3/uL (ref 0.0–0.5)
EOS%: 1 % (ref 0.0–7.0)
HCT: 40.5 % (ref 34.8–46.6)
HEMOGLOBIN: 13.9 g/dL (ref 11.6–15.9)
LYMPH%: 19.4 % (ref 14.0–49.7)
MCH: 30 pg (ref 25.1–34.0)
MCHC: 34.3 g/dL (ref 31.5–36.0)
MCV: 87.5 fL (ref 79.5–101.0)
MONO#: 0.8 10*3/uL (ref 0.1–0.9)
MONO%: 10 % (ref 0.0–14.0)
NEUT#: 5.5 10*3/uL (ref 1.5–6.5)
NEUT%: 69.3 % (ref 38.4–76.8)
PLATELETS: 352 10*3/uL (ref 145–400)
RBC: 4.63 10*6/uL (ref 3.70–5.45)
RDW: 13.7 % (ref 11.2–14.5)
WBC: 7.9 10*3/uL (ref 3.9–10.3)
lymph#: 1.5 10*3/uL (ref 0.9–3.3)

## 2015-01-04 LAB — COMPREHENSIVE METABOLIC PANEL (CC13)
ALT: 16 U/L (ref 0–55)
AST: 17 U/L (ref 5–34)
Albumin: 3.5 g/dL (ref 3.5–5.0)
Alkaline Phosphatase: 67 U/L (ref 40–150)
Anion Gap: 10 mEq/L (ref 3–11)
BUN: 15.3 mg/dL (ref 7.0–26.0)
CHLORIDE: 103 meq/L (ref 98–109)
CO2: 26 meq/L (ref 22–29)
CREATININE: 0.9 mg/dL (ref 0.6–1.1)
Calcium: 8.9 mg/dL (ref 8.4–10.4)
EGFR: 73 mL/min/{1.73_m2} — ABNORMAL LOW (ref >90)
Glucose: 112 mg/dl (ref 70–140)
POTASSIUM: 4.2 meq/L (ref 3.5–5.1)
Sodium: 139 mEq/L (ref 136–145)
Total Bilirubin: 0.62 mg/dL (ref 0.20–1.20)
Total Protein: 6.8 g/dL (ref 6.4–8.3)

## 2015-01-04 MED ORDER — FLUOCINONIDE 0.05 % EX CREA: 1.0000 "application " | TOPICAL_CREAM | Freq: Four times a day (QID) | CUTANEOUS | Status: DC

## 2015-01-04 NOTE — Progress Notes (Signed)
Patient Care Team: [name], MD as PCP - General [facility]. Rolena Infante, MD as Referring Physician (Obstetrics and Gynecology)  DIAGNOSIS: No matching staging information was found for the patient.  SUMMARY OF ONCOLOGIC HISTORY:   Breast cancer of upper-outer quadrant of right female breast   01/01/2013 Initial Diagnosis Mammogram showed diffuse skin thickening and enlarged right axillary lymph nodes with right breast density ultrasound showed 4.5 cm mass right axillary 3.1 cm biopsy showed IMC grade 2-3 lymph node also positive ER/PR neg HER-2 pos Ki-67 76%, MRI 8 cm mas   01/16/2013 - 05/01/2013 Neo-Adjuvant Chemotherapy Taxotere carboplatin Herceptin and Perjeta x6 cycles   05/29/2013 Surgery Bilateral mastectomies, left breasts no evidence of cancer right breast no evidence of cancer 40 lymph nodes negative complete pathologic response    - 02/02/2014 Chemotherapy Adjuvant Herceptin   07/27/2013 - 09/11/2013 Radiation Therapy Adjuvant radiation therapy    CHIEF COMPLIANT: follow-up of HER-2 positive breast cancer  INTERVAL HISTORY: Jasmine Schwartz is a 49 year old with above-mentioned history of bilateral mastectomies for a right-sided breast cancer treated with neoadjuvant chemotherapy and had a complete pathologic response. She is here for routine follow-up. She reports she has a rash on her legs and her back in the buttock area. She has seen a dermatologist and her primary care physician Debbie trying different medications and creams but have not had any great response. Denies any problems with the breast. She is complaining of profound fatigue which has been going on for the past month. She was found to be hypothyroid.   REVIEW OF SYSTEMS:   Constitutional: Denies fevers, chills or abnormal weight loss Eyes: Denies blurriness of vision Ears, nose, mouth, throat, and face: Denies mucositis or sore throat Respiratory: Denies cough, dyspnea or wheezes Cardiovascular: Denies palpitation, chest  discomfort or lower extremity swelling Gastrointestinal:  Denies nausea, heartburn or change in bowel habits Skin: rash on the leg buttock and back maculopapular Lymphatics: Denies new lymphadenopathy or easy bruising Neurological:Denies numbness, tingling or new weaknesses Behavioral/Psych: Mood is stable, no new changes  Breast:  denies any pain or lumps or nodules in either breasts All other systems were reviewed with the patient and are negative.  I have reviewed the past medical history, past surgical history, social history and family history with the patient and they are unchanged from previous note.  ALLERGIES:  has No Known Allergies.  MEDICATIONS:  Current Outpatient Prescriptions  Medication Sig Dispense Refill  . buPROPion (WELLBUTRIN XL) 150 MG 24 hr tablet Take 1 tablet (150 mg total) by mouth daily. 30 tablet 11  . fluconazole (DIFLUCAN) 200 MG tablet TAKE 1 TABLET BY MOUTH DAILY 5 tablet 0  . fluocinonide cream (LIDEX) 9.41 % Apply 1 application topically 4 (four) times daily. As needed for rash 30 g 0  . glucose blood (NOVA MAX TEST) test strip Use as directed up to 8 times a day    . Insulin Infusion Pump (MINIMED INSULIN PUMP) DEVI Inject 1 Units/hr into the vein continuous. Pt give extra per cards at meal times. Basic meals are about 5 units    . Insulin Infusion Pump Supplies (MINIMED INFUSION SET-MMT 397) MISC 1 Device by Does not apply route every 3 (three) days.    . Insulin Infusion Pump Supplies (PARADIGM PUMP RESERVOIR 1.76ML) MISC 1 Device by Does not apply route every 3 (three) days.    . insulin lispro (HUMALOG) 100 UNIT/ML injection For use in pump, total of 60 units per day    .  lidocaine-prilocaine (EMLA) cream Apply 1 application topically as needed. 30 g 1   No current facility-administered medications for this visit.    PHYSICAL EXAMINATION: ECOG PERFORMANCE STATUS: 1 - Symptomatic but completely ambulatory  Filed Vitals:   01/04/15 1436  BP:  122/75  Pulse: 101  Temp: 98.1 F (36.7 C)  Resp: 18   Filed Weights   01/04/15 1436  Weight: 153 lb 4 oz (69.514 kg)    GENERAL:alert, no distress and comfortable SKIN: skin color, texture, turgor are normal, no rashes or significant lesions EYES: normal, Conjunctiva are pink and non-injected, sclera clear OROPHARYNX:no exudate, no erythema and lips, buccal mucosa, and tongue normal  NECK: supple, thyroid normal size, non-tender, without nodularity LYMPH:  no palpable lymphadenopathy in the cervical, axillary or inguinal LUNGS: clear to auscultation and percussion with normal breathing effort HEART: regular rate & rhythm and no murmurs and no lower extremity edema ABDOMEN:abdomen soft, non-tender and normal bowel sounds Musculoskeletal:no cyanosis of digits and no clubbing  NEURO: alert & oriented x 3 with fluent speech, no focal motor/sensory deficits BREAST: chest wall and axilla exam is normal. (exam performed in the presence of a chaperone)  LABORATORY DATA:  I have reviewed the data as listed   Chemistry      Component Value Date/Time   NA 139 01/04/2015 1421   NA 136 03/24/2014 1457   K 4.2 01/04/2015 1421   K 4.2 03/24/2014 1457   CL 98 03/24/2014 1457   CL 99 01/23/2013 1251   CO2 26 01/04/2015 1421   CO2 26 03/24/2014 1457   BUN 15.3 01/04/2015 1421   BUN 21 03/24/2014 1457   CREATININE 0.9 01/04/2015 1421   CREATININE 0.81 03/24/2014 1457      Component Value Date/Time   CALCIUM 8.9 01/04/2015 1421   CALCIUM 9.5 03/24/2014 1457   ALKPHOS 67 01/04/2015 1421   ALKPHOS 96 03/24/2014 1457   AST 17 01/04/2015 1421   AST 16 03/24/2014 1457   ALT 16 01/04/2015 1421   ALT 12 03/24/2014 1457   BILITOT 0.62 01/04/2015 1421   BILITOT 0.5 03/24/2014 1457       Lab Results  Component Value Date   WBC 7.9 01/04/2015   HGB 13.9 01/04/2015   HCT 40.5 01/04/2015   MCV 87.5 01/04/2015   PLT 352 01/04/2015   NEUTROABS 5.5 01/04/2015    ASSESSMENT & PLAN:   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 Patient has underwent neoadjuvant treatment, followed by bilateral mastectomies with a complete pathologic response. She then completed a calendar year of Herceptin.  Breast Cancer Surveillance: 1. Breast exam 01/03/15: Normal chest wall exam 2. Mammogram: No role of imaging.  skin rash appears to be a drug eruption but she has not take any new drugs.she will follow with dermatology and primary care physician. Hypothyroidism: TSH has been ordered today. I reviewed her CBC and CMP which were normal. RTC in 1 year   No orders of the defined types were placed in this encounter.   The patient has a good understanding of the overall plan. she agrees with it. she will call with any problems that may develop before the next visit here.   Gudena, Vinay K, MD      

## 2015-01-04 NOTE — Progress Notes (Signed)
Subjective:    Patient ID: Jasmine Schwartz, female    DOB: Jul 10, 1966, 49 y.o.   MRN: [id number]  HPI  The state of at least three ongoing medical problems is addressed today, with interval history of each noted here: Pt returns for f/u of diabetes mellitus: DM type: 1 Dx'ed: 5053 Complications: none Therapy: insulin since dx DKA: never Severe hypoglycemia: never Pancreatitis: never Other: she has been on pump rx since 2004; she declined continuous glucose monitor, due to cost; she has finisher rx for her breast cancer, for now.  Interval history: she takes these pump settings:  basal rate of 0.95 units/hr,  except 1.2 units/hr, 3AM-6AM  mealtime bolus of 1 unit/ 13 grams carbohydrate  correction bolus (which some people call "sensitivity," or "insulin sensitivity ratio," or just "isr") of 1 unit for each 50 by which your glucose exceeds 100).  no cbg record, but states cbg's are lowest at lunch (90), and highest in am (200).  She averages a total of approx 55 units per day.   Hypothyroidism: pt says the synthroid is causing weight gain.  Rash and itching: persists.  She has seen derm.  Past Medical History  Diagnosis Date  . Anxiety   . Depression   . Dyslipidemia   . Anxiety and depression 07/06/2011  . Hx of insertion of insulin pump ~ 2001 to present (06/09/2013)  . PONV (postoperative nausea and vomiting)   . Venous insufficiency   . Lower extremity edema   . Diabetes mellitus     IDDM  . Type I diabetes mellitus     uses insulin pump  . Breast cancer 01/01/13    /metastatic 1/1 lymph nodes    Past Surgical History  Procedure Laterality Date  . Lumbar disc surgery  2009    Dr Maxie Better  . Cesarean section  1989  . Tubal ligation  1991  . Breast biopsy Right 01/01/13    Invasive mammary ca,metastatic in 1/1 lymph node  . Breast biopsy Left 01/09/13    Fibroadenoma,microcalcifications  . Portacath placement Left 01/2013  . Mastectomy modified radical Right 06/09/2013  .  Mastectomy complete / simple Left 06/09/2013  . Mastectomy modified radical Right 06/09/2013    Procedure: RIGHT MASTECTOMY MODIFIED RADICAL;  Surgeon: Adin Hector, MD;  Location: [facility];  Service: General;  Laterality: Right;  . Simple mastectomy with axillary sentinel node biopsy Left 06/09/2013    Procedure: LEFT SIMPLE MASTECTOMY;  Surgeon: Adin Hector, MD;  Location: [facility];  Service: General;  Laterality: Left;    History   Social History  . Marital Status: Married    Spouse Name: N/A  . Number of Children: 2  . Years of Education: N/A   Occupational History  .     Social History Main Topics  . Smoking status: Never Smoker   . Smokeless tobacco: Never Used  . Alcohol Use: No  . Drug Use: No  . Sexual Activity: Yes   Other Topics Concern  . Not on file   Social History Narrative    Current Outpatient Prescriptions on File Prior to Visit  Medication Sig Dispense Refill  . buPROPion (WELLBUTRIN XL) 150 MG 24 hr tablet Take 1 tablet (150 mg total) by mouth daily. 30 tablet 11  . fluconazole (DIFLUCAN) 200 MG tablet TAKE 1 TABLET BY MOUTH DAILY 5 tablet 0  . glucose blood (NOVA MAX TEST) test strip Use as directed up to 8 times a day    .  Insulin Infusion Pump (MINIMED INSULIN PUMP) DEVI Inject 1 Units/hr into the vein continuous. Pt give extra per cards at meal times. Basic meals are about 5 units    . Insulin Infusion Pump Supplies (MINIMED INFUSION SET-MMT 397) MISC 1 Device by Does not apply route every 3 (three) days.    . Insulin Infusion Pump Supplies (PARADIGM PUMP RESERVOIR 1.76ML) MISC 1 Device by Does not apply route every 3 (three) days.    Marland Kitchen lidocaine-prilocaine (EMLA) cream Apply 1 application topically as needed. 30 g 1   No current facility-administered medications on file prior to visit.    No Known Allergies  Family History  Problem Relation Age of Onset  . Cancer Neg Hx   . Diabetes Neg Hx   . Coronary artery disease Mother   . Congestive  Heart Failure Maternal Grandmother   . Brain cancer Maternal Grandfather     dx in his early 45s    BP 124/82 mmHg  Pulse 115  Temp(Src) 98.3 F (36.8 C) (Oral)  Ht 5\' 6"  (1.676 m)  Wt 153 lb (69.4 kg)  BMI 24.71 kg/m2  SpO2 95%    Review of Systems She denies hypoglycemia and weight change.      Objective:   Physical Exam VITAL SIGNS:  See vs page GENERAL: no distress Pulses: dorsalis pedis intact bilat.   MSK: no deformity of the feet CV: no leg edema Skin:  no ulcer on the feet.  normal color and temp on the feet.  Dyshidrosis on the back and thighs. Neuro: sensation is intact to touch on the feet, but decreased from normal.        Assessment & Plan:  DM: she needs increased rx Hypothyroidism.  therapy is limited by perceived drug intolerance Rash, persistent--appears to be dyshidrosis or eczema  Patient is advised the following: Patient Instructions  increase basal rate to 0.95 units/hr, except 2 units/hr, 3AM-6AM.   continue mealtime bolus, of 1 unit/ 13 grams carbohydrate.  continue correction bolus (which some people call "sensitivity," or "insulin sensitivity ratio," or just "isr") of 1 unit for each 50 by which your glucose exceeds 100).   check your blood sugar 4 times a day: before the 3 meals, and at bedtime.  also check if you have symptoms of your blood sugar being too high or too low.  please keep a record of the readings and bring it to your next appointment here.  You can write it on any piece of paper.  please call us sooner if your blood sugar goes below 70, or if you have a lot of readings over 200.   blood tests are being requested for you today.  We'll contact you with results.   If the thyroid is low again, please try a lower amount of the thyroid medication.  Please come back for a regular physical appointment in 3 months.     addendum: i have sent a prescription to your pharmacy, for skin cream.

## 2015-01-04 NOTE — Patient Instructions (Addendum)
increase basal rate to 0.95 units/hr, except 2 units/hr, 3AM-6AM.   continue mealtime bolus, of 1 unit/ 13 grams carbohydrate.  continue correction bolus (which some people call "sensitivity," or "insulin sensitivity ratio," or just "isr") of 1 unit for each 50 by which your glucose exceeds 100).   check your blood sugar 4 times a day: before the 3 meals, and at bedtime.  also check if you have symptoms of your blood sugar being too high or too low.  please keep a record of the readings and bring it to your next appointment here.  You can write it on any piece of paper.  please call us sooner if your blood sugar goes below 70, or if you have a lot of readings over 200.   blood tests are being requested for you today.  We'll contact you with results.   If the thyroid is low again, please try a lower amount of the thyroid medication.  Please come back for a regular physical appointment in 3 months.

## 2015-01-04 NOTE — Telephone Encounter (Signed)
Appointments made and avs printed for patient °

## 2015-01-05 ENCOUNTER — Telehealth: Payer: Self-pay | Admitting: Endocrinology

## 2015-01-05 NOTE — Telephone Encounter (Signed)
Received notification from [facility] patient had her labs drawn there. Once resulted results will be faxed to our office.

## 2015-01-05 NOTE — Telephone Encounter (Signed)
please call patient: Looks like our labs didn't get drawn yesterday. Please come in to have drawn

## 2015-01-06 ENCOUNTER — Telehealth: Payer: Self-pay | Admitting: Endocrinology

## 2015-01-06 NOTE — Telephone Encounter (Signed)
Requested call back from patient to discuss lab work.

## 2015-01-06 NOTE — Telephone Encounter (Signed)
please call patient: A1c=8.7 increase basal rate to 1 unit/hr, except 2.5 units/hr, 3AM-6AM.  increase mealtime bolus to 1 unit/ 12 grams carbohydrate.  continue correction bolus (which some people call "sensitivity," or "insulin sensitivity ratio," or just "isr") of 1 unit for each 50 by which your glucose exceeds 100). Thyroid is just borderline low.  i would be happy to prescribe medication for you, or we could recheck in the future.

## 2015-01-06 NOTE — Telephone Encounter (Signed)
Patient advised of note below and voiced understanding.  

## 2015-01-11 ENCOUNTER — Telehealth: Payer: Self-pay | Admitting: Endocrinology

## 2015-01-11 ENCOUNTER — Other Ambulatory Visit: Payer: Self-pay

## 2015-01-11 MED ORDER — INSULIN LISPRO 100 UNIT/ML ~~LOC~~ SOLN: SUBCUTANEOUS | Status: DC

## 2015-01-11 NOTE — Telephone Encounter (Signed)
See note below and please advise, Thanks! 

## 2015-01-11 NOTE — Telephone Encounter (Signed)
Patient advised rx for Humalog has been resent with refills.

## 2015-01-11 NOTE — Telephone Encounter (Signed)
Ok, i have sent a prescription to your pharmacy  

## 2015-01-11 NOTE — Telephone Encounter (Signed)
Pt called and said when she went to get her insulin this weekend they would only give her one bottle not two , pt would like to know if Dr. Loanne Drilling will rewrite script for two bottles. Also she has no refills left.  P;ease call pt back and advise per pt

## 2015-01-16 ENCOUNTER — Encounter (HOSPITAL_COMMUNITY): Payer: Self-pay | Admitting: *Deleted

## 2015-01-16 ENCOUNTER — Emergency Department (HOSPITAL_COMMUNITY): Payer: 59

## 2015-01-16 ENCOUNTER — Emergency Department (HOSPITAL_COMMUNITY)
Admission: EM | Admit: 2015-01-16 | Discharge: 2015-01-16 | Disposition: A | Discharge status: Home / Self Care | Payer: 59 | Attending: Emergency Medicine | Admitting: Emergency Medicine

## 2015-01-16 DIAGNOSIS — Y9289 Other specified places as the place of occurrence of the external cause: Secondary | ICD-10-CM | POA: Diagnosis not present

## 2015-01-16 DIAGNOSIS — Z79899 Other long term (current) drug therapy: Secondary | ICD-10-CM | POA: Insufficient documentation

## 2015-01-16 DIAGNOSIS — Z8679 Personal history of other diseases of the circulatory system: Secondary | ICD-10-CM | POA: Diagnosis not present

## 2015-01-16 DIAGNOSIS — F329 Major depressive disorder, single episode, unspecified: Secondary | ICD-10-CM | POA: Diagnosis not present

## 2015-01-16 DIAGNOSIS — Y998 Other external cause status: Secondary | ICD-10-CM | POA: Diagnosis not present

## 2015-01-16 DIAGNOSIS — S8992XA Unspecified injury of left lower leg, initial encounter: Secondary | ICD-10-CM | POA: Diagnosis not present

## 2015-01-16 DIAGNOSIS — Z853 Personal history of malignant neoplasm of breast: Secondary | ICD-10-CM | POA: Diagnosis not present

## 2015-01-16 DIAGNOSIS — W108XXA Fall (on) (from) other stairs and steps, initial encounter: Secondary | ICD-10-CM | POA: Diagnosis not present

## 2015-01-16 DIAGNOSIS — E109 Type 1 diabetes mellitus without complications: Secondary | ICD-10-CM | POA: Diagnosis not present

## 2015-01-16 DIAGNOSIS — Z794 Long term (current) use of insulin: Secondary | ICD-10-CM | POA: Insufficient documentation

## 2015-01-16 DIAGNOSIS — Z9641 Presence of insulin pump (external) (internal): Secondary | ICD-10-CM | POA: Diagnosis not present

## 2015-01-16 DIAGNOSIS — Y9389 Activity, other specified: Secondary | ICD-10-CM | POA: Diagnosis not present

## 2015-01-16 DIAGNOSIS — F419 Anxiety disorder, unspecified: Secondary | ICD-10-CM | POA: Diagnosis not present

## 2015-01-16 MED ORDER — HYDROCODONE-ACETAMINOPHEN 5-325 MG PO TABS: 1.0000 | ORAL_TABLET | Freq: Four times a day (QID) | ORAL | Status: DC | PRN

## 2015-01-16 MED ORDER — IBUPROFEN 600 MG PO TABS: 600.0000 mg | ORAL_TABLET | Freq: Four times a day (QID) | ORAL | Status: DC | PRN

## 2015-01-16 NOTE — ED Notes (Signed)
Pt states that she was taking a cooler down stairs and fell down approx 4 step. Pt states that she has pain with walking and bearing weight. Denies hitting head or LOC.

## 2015-01-16 NOTE — ED Notes (Signed)
Declined W/C at D/C and was escorted to lobby by RN. 

## 2015-01-16 NOTE — ED Provider Notes (Signed)
CSN: [id number]     Arrival date & time 01/16/15  1529 History  This chart was scribed for non-physician practitioner, Dahlia Bailiff, PA-C,working with Daleen Bo, MD, by Marlowe Kays, ED Scribe. This patient was seen in room TR08C/TR08C and the patient's care was started at [facility] PM.  Chief Complaint  Patient presents with  . Fall  . Knee Injury   The history is provided by the patient and medical records. No language interpreter was used.    HPI Comments:  Jasmine Schwartz is a 49 y.o. female who presents to the Emergency Department complaining of severe left knee pain secondary to falling down approximately four steps earlier today. She reports associated mild swelling of the area. She states she is unsure if it twisted but states that when she landed she her knee was bent. Extending the knee makes the pain worse. Resting the knee helps to alleviate the pain. Pt has applied ice to the area with no significant relief. She denies head injury, LOC, numbness, tingling, weakness, redness, nausea, vomiting, bruising or wounds. Denies posterior thigh pain.  Past Medical History  Diagnosis Date  . Anxiety   . Depression   . Dyslipidemia   . Anxiety and depression 07/06/2011  . Hx of insertion of insulin pump ~ 2001 to present (06/09/2013)  . PONV (postoperative nausea and vomiting)   . Venous insufficiency   . Lower extremity edema   . Diabetes mellitus     IDDM  . Type I diabetes mellitus     uses insulin pump  . Breast cancer 01/01/13    /metastatic 1/1 lymph nodes   Past Surgical History  Procedure Laterality Date  . Lumbar disc surgery  2009    Dr Maxie Better  . Cesarean section  1989  . Tubal ligation  1991  . Breast biopsy Right 01/01/13    Invasive mammary ca,metastatic in 1/1 lymph node  . Breast biopsy Left 01/09/13    Fibroadenoma,microcalcifications  . Portacath placement Left 01/2013  . Mastectomy modified radical Right 06/09/2013  . Mastectomy complete / simple Left 06/09/2013  .  Mastectomy modified radical Right 06/09/2013    Procedure: RIGHT MASTECTOMY MODIFIED RADICAL;  Surgeon: Adin Hector, MD;  Location: [facility];  Service: General;  Laterality: Right;  . Simple mastectomy with axillary sentinel node biopsy Left 06/09/2013    Procedure: LEFT SIMPLE MASTECTOMY;  Surgeon: Adin Hector, MD;  Location: [place];  Service: General;  Laterality: Left;   Family History  Problem Relation Age of Onset  . Cancer Neg Hx   . Diabetes Neg Hx   . Coronary artery disease Mother   . Congestive Heart Failure Maternal Grandmother   . Brain cancer Maternal Grandfather     dx in his early 70s   History  Substance Use Topics  . Smoking status: Never Smoker   . Smokeless tobacco: Never Used  . Alcohol Use: No   OB History    No data available     Review of Systems  Gastrointestinal: Negative for nausea and vomiting.  Musculoskeletal: Positive for joint swelling and arthralgias.  Skin: Negative for color change and wound.  Neurological: Negative for syncope, weakness and numbness.    Allergies  Review of patient's allergies indicates no known allergies.  Home Medications   Prior to Admission medications   Medication Sig Start Date End Date Taking? Authorizing Provider  buPROPion (WELLBUTRIN XL) 150 MG 24 hr tablet Take 1 tablet (150 mg total) by mouth daily. 02/17/14  [name], MD  fluconazole (DIFLUCAN) 200 MG tablet TAKE 1 TABLET BY MOUTH DAILY 04/30/14   Minette Headland, NP  fluocinonide cream (LIDEX) 6.07 % Apply 1 application topically 4 (four) times daily. As needed for rash 01/04/15   [name], MD  glucose blood (NOVA MAX TEST) test strip Use as directed up to 8 times a day    Historical Provider, MD  HYDROcodone-acetaminophen (NORCO/VICODIN) 5-325 MG per tablet Take 1 tablet by mouth every 6 (six) hours as needed. 01/16/15   Dahlia Bailiff, PA-C  ibuprofen (ADVIL,MOTRIN) 600 MG tablet Take 1 tablet (600 mg total) by mouth every 6 (six) hours as needed.  01/16/15   Dahlia Bailiff, PA-C  Insulin Infusion Pump (MINIMED INSULIN PUMP) DEVI Inject 1 Units/hr into the vein continuous. Pt give extra per cards at meal times. Basic meals are about 5 units    Historical Provider, MD  Insulin Infusion Pump Supplies (MINIMED INFUSION SET-MMT 397) MISC 1 Device by Does not apply route every 3 (three) days.    Historical Provider, MD  Insulin Infusion Pump Supplies (PARADIGM PUMP RESERVOIR 1.76ML) MISC 1 Device by Does not apply route every 3 (three) days.    Historical Provider, MD  insulin lispro (HUMALOG) 100 UNIT/ML injection For use in pump, total of 60 units per day 01/11/15   [name], MD  lidocaine-prilocaine (EMLA) cream Apply 1 application topically as needed. 02/09/14   Minette Headland, NP   Triage Vitals: BP 116/65 mmHg  Pulse 110  Temp(Src) 98.4 F (36.9 C) (Oral)  Resp 20  Ht 5\' 4"  (1.626 m)  Wt 155 lb (70.308 kg)  BMI 26.59 kg/m2  SpO2 100% Physical Exam  Constitutional: She is oriented to person, place, and time. She appears well-developed and well-nourished.  HENT:  Head: Normocephalic and atraumatic.  Eyes: EOM are normal.  Neck: Normal range of motion.  Cardiovascular: Normal rate.   Pulses:      Dorsalis pedis pulses are 2+ on the left side.  Pulmonary/Chest: Effort normal.  Musculoskeletal: Normal range of motion. She exhibits tenderness.  Left knee with mild joint effusion. Full active and passive range of motion of knee. Pain with extension of knee. Tenderness to palpation of posterior lateral joint compartment. No gross instability anterior/posterior or medial/lateral. No gastrocnemius tenderness. No Achilles tendon tenderness. Full range of motion of foot without discomfort. DP pulse 2+. Distal sensation intact.  Neurological: She is alert and oriented to person, place, and time.  Distal sensations intact.  Skin: Skin is warm and dry.  Psychiatric: She has a normal mood and affect. Her behavior is normal.  Nursing note and  vitals reviewed.   ED Course  Procedures (including critical care time) DIAGNOSTIC STUDIES: Oxygen Saturation is 100% on RA, normal by my interpretation.   COORDINATION OF CARE: 5:11 PM- Will prescribe Vicodin for Motrin and refer to orthopedics. Will order knee immobilizer and cane instead of crutches. Pt verbalizes understanding and agrees to plan.  Medications - No data to display  Labs Review Labs Reviewed - No data to display  Imaging Review Dg Knee Complete 4 Views Left  01/16/2015   CLINICAL DATA:  Knee injury post fall, fell on steps while pulling a cooler on wheels, injured LEFT knee, unable to bear weight  EXAM: LEFT KNEE - COMPLETE 4+ VIEW  COMPARISON:  None  FINDINGS: Osseous mineralization grossly normal for technique joint spaces preserved.  Knee joint effusion present.  Small displaced bone fragment identified at the intercondylar  region, question at anterior tibial spine.  No additional fracture, dislocation or bone destruction.  IMPRESSION: Question avulsion fracture at anterior tibial spine with associated joint effusion.   Electronically Signed   By: Lavonia Dana M.D.   On: 01/16/2015 16:29     EKG Interpretation None      MDM   Final diagnoses:  Knee injury, left, initial encounter    Patient with knee injury consistent with internal derangement versus ligamentous injury. This is based on exam couple of patient's radiographic findings of questionable avulsion injury. Patient is neurovascularly intact. We will place patient in knee immobilizer, encouraged RICE therapy, discharge patient to have her follow-up with orthopedics. Patient states she has used [place] [facility] in the past, and wishes to go back there. Discussed return precautions with patient, patient verbalizes understanding and agreement of this plan.  I personally performed the services described in this documentation, which was scribed in my presence. The recorded information has been reviewed  and is accurate.  BP 116/65 mmHg  Pulse 110  Temp(Src) 98.4 F (36.9 C) (Oral)  Resp 20  Ht 5\' 4"  (1.626 m)  Wt 155 lb (70.308 kg)  BMI 26.59 kg/m2  SpO2 100%  Signed,  Dahlia Bailiff, PA-C 5:35 PM  Patient seen and discussed with Dr. Daleen Bo, M.D.  Dahlia Bailiff, PA-C 01/16/15 1735  Daleen Bo, MD 01/16/15 [place], MD 01/16/15 [phone]

## 2015-01-16 NOTE — Discharge Instructions (Signed)

## 2015-01-16 NOTE — ED Provider Notes (Signed)
  Face-to-face evaluation   History: Injury, left knee, in fall down several steps today. She had immediate pain and swelling. She feels like the knee is unstable with walking.  Physical exam: Alert, calm, cooperative. Left knee with palpable effusion. She is able to extend the knee. She could not tolerate stressing the joint to assess for instability.  Medical screening examination/treatment/procedure(s) were conducted as a shared visit with non-physician practitioner(s) and myself.  I personally evaluated the patient during the encounter  Daleen Bo, MD 01/16/15 2311

## 2015-01-24 ENCOUNTER — Other Ambulatory Visit: Payer: Self-pay | Admitting: Orthopedic Surgery

## 2015-01-24 DIAGNOSIS — S83512A Sprain of anterior cruciate ligament of left knee, initial encounter: Secondary | ICD-10-CM

## 2015-01-25 ENCOUNTER — Encounter: Payer: Self-pay | Admitting: Endocrinology

## 2015-01-30 IMAGING — XA IR FLUORO GUIDE CV LINE*L*
1 series · 2 of 2 positions shown · non-contrast
Comparison: none

Clinical Data/Indication: Breast cancer

TUNNEL POWER PORT PLACEMENT WITH SUBCUTANEOUS POCKET UTILIZING
ULTRASOUND & FLOUROSCOPY
Sedation: Versed 5.0 mg, Fentanyl 250 mcg.
Total Moderate Sedation Time: 35 minutes.
As antibiotic prophylaxis, Ancef  was ordered pre-procedure and
administered intravenously within one hour of incision.
Fluoroscopy Time: 36 seconds.
Procedure:  After written informed consent was obtained, patient
was placed in the supine position on angiographic table. The left
neck and chest was prepped and draped in a sterile fashion.
Lidocaine was utilized for local anesthesia.  The left internal
jugular vein was noted to be patent initially with ultrasound.
Under sonographic guidance, a micropuncture needle was inserted
into the left IJ vein (Ultrasound and fluoroscopic image
documentation was performed). The needle was removed over an 018
wire which was exchanged for a Amplatz.  This was advanced into the
IVC.  An 8-French dilator was advanced over the Amplatz.
A small incision was made in the left upper chest over the anterior
left second rib.  Utilizing blunt dissection, a subcutaneous pocket
was created in the caudal direction. The pocket was irrigated with
a copious amount of sterile normal saline.  The port catheter was
tunneled from the chest incision, and out the neck incision.  The
reservoir was inserted into the subcutaneous pocket and secured
with two 3-0 Ethilon stitches.  A peel-away sheath was advanced
over the Amplatz wire.  The port catheter was cut to measure length
and inserted through the peel-away sheath.  The peel-away sheath
was removed.  The chest incision was closed with 3-0 Vicryl
interrupted stitches for the subcutaneous tissue and a running of 4-
0 Vicryl subcuticular stitch for the skin.  The neck incision was
closed with a 4-0 Vicryl subcuticular stitch.  Derma-bond was
applied to both surgical incisions.  The port reservoir was flushed
and instilled with heparinized saline.  No complications.

[Series 300: line placements · 2 of 2 slices shown]
[im 1/2]
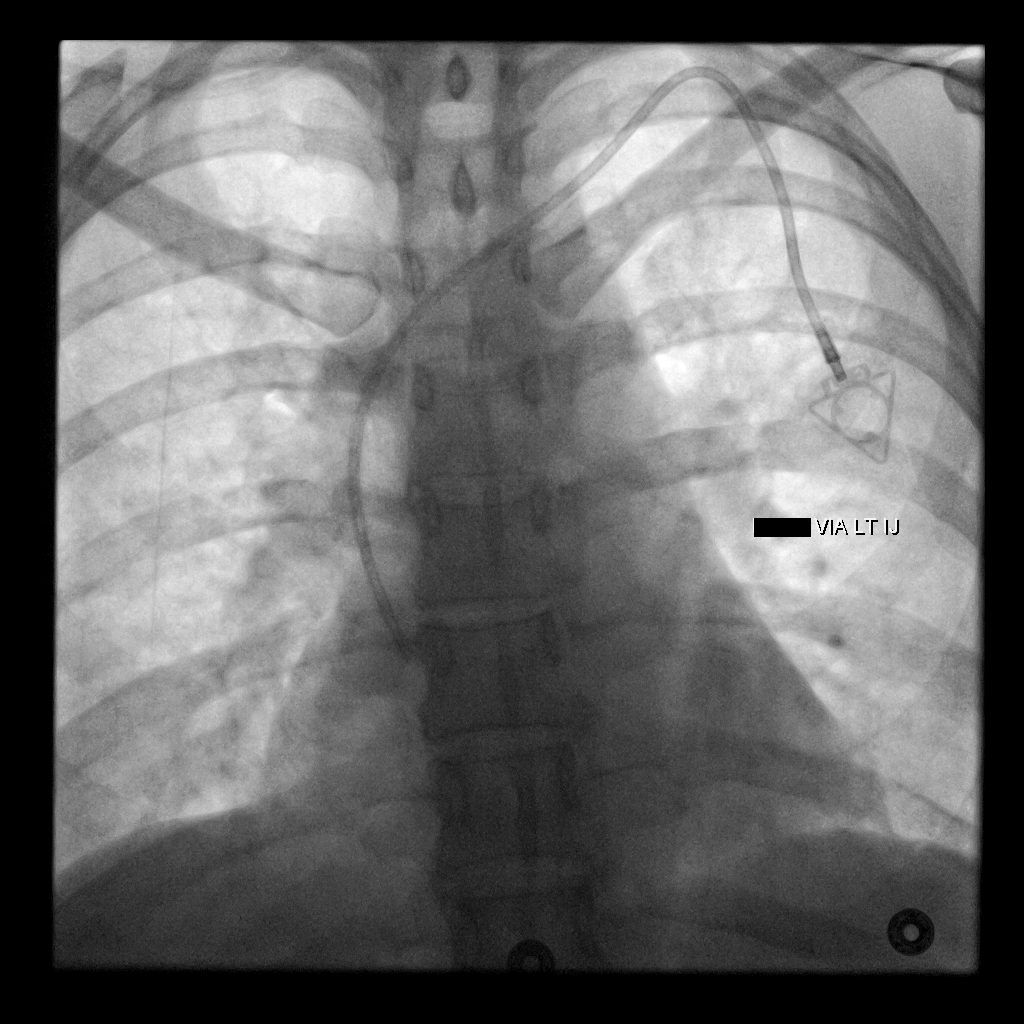
[im 2/2]
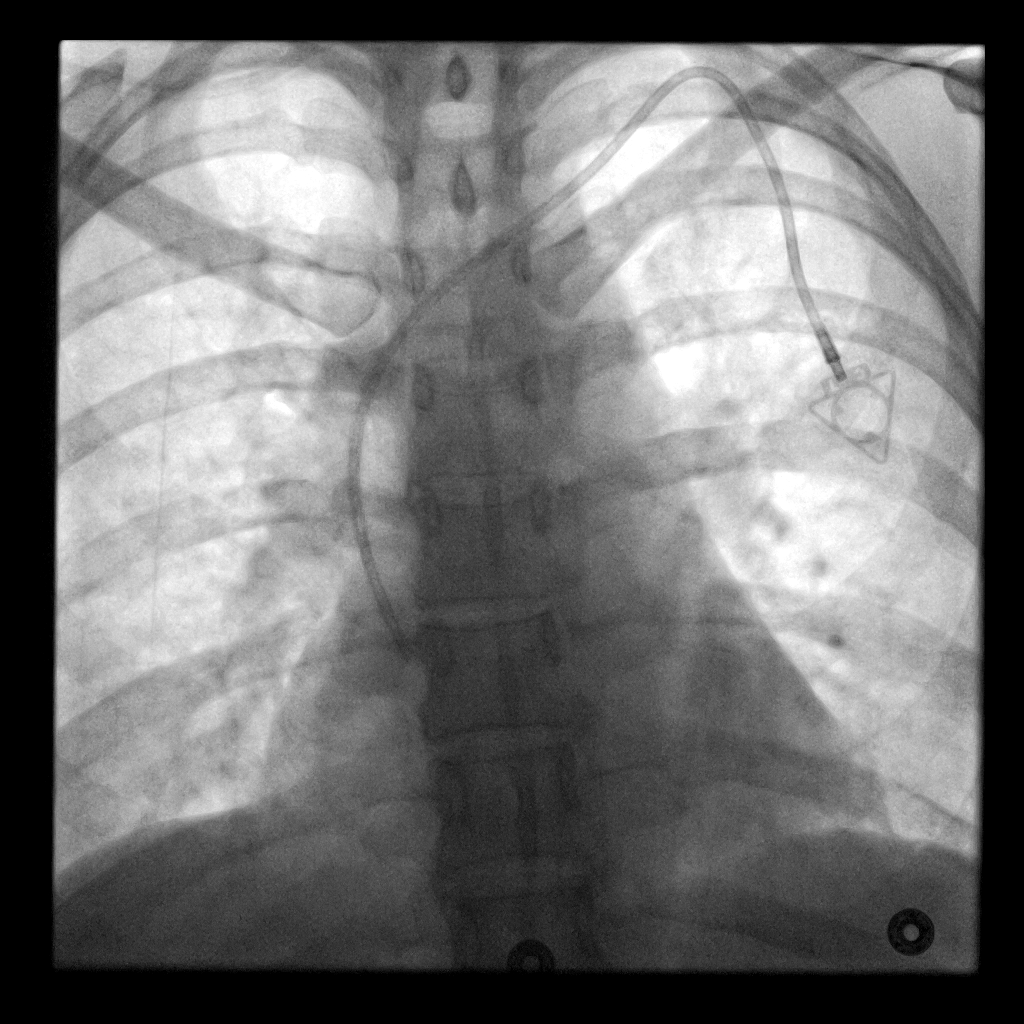

[2 of 2 positions shown; findings below may reference images not displayed]

FINDINGS: A left IJ vein Port-A-Cath is in place with its tip at
the cavoatrial junction.
IMPRESSION: Successful 8 French left internal jugular vein power port placement
with its tip at the SVC/RA junction.

## 2015-02-07 ENCOUNTER — Other Ambulatory Visit: Payer: 59

## 2015-04-06 ENCOUNTER — Ambulatory Visit (INDEPENDENT_AMBULATORY_CARE_PROVIDER_SITE_OTHER): Payer: 59 | Admitting: Endocrinology

## 2015-04-06 ENCOUNTER — Encounter: Payer: Self-pay | Admitting: Endocrinology

## 2015-04-06 VITALS — BP 126/80 | HR 79 | Temp 98.1°F | Ht 66.0 in | Wt 159.0 lb

## 2015-04-06 DIAGNOSIS — Z119 Encounter for screening for infectious and parasitic diseases, unspecified: Secondary | ICD-10-CM | POA: Diagnosis not present

## 2015-04-06 DIAGNOSIS — E1042 Type 1 diabetes mellitus with diabetic polyneuropathy: Secondary | ICD-10-CM | POA: Diagnosis not present

## 2015-04-06 DIAGNOSIS — E039 Hypothyroidism, unspecified: Secondary | ICD-10-CM | POA: Insufficient documentation

## 2015-04-06 DIAGNOSIS — Z0000 Encounter for general adult medical examination without abnormal findings: Secondary | ICD-10-CM | POA: Diagnosis not present

## 2015-04-06 LAB — POCT GLYCOSYLATED HEMOGLOBIN (HGB A1C): HEMOGLOBIN A1C: 7.7

## 2015-04-06 NOTE — Patient Instructions (Addendum)
Please take these pump settings: same basal rate of 0.95 units/hr, except 2 units/hr, 3AM-6AM.   increase mealtime bolus to 1 unit/ 12 grams carbohydrate.  continue correction bolus (which some people call "sensitivity," or "insulin sensitivity ratio," or just "isr") of 1 unit for each 50 by which your glucose exceeds 100).   check your blood sugar 4 times a day: before the 3 meals, and at bedtime.  also check if you have symptoms of your blood sugar being too high or too low.  please keep a record of the readings and bring it to your next appointment here.  You can write it on any piece of paper.  please call us sooner if your blood sugar goes below 70, or if you have a lot of readings over 200.   blood tests are requested for you today.  We'll contact you with results.   If the thyroid is low again, please try a lower amount of the thyroid medication.  Please come back for a follow-up appointment in 3 months.   please consider these measures for your health:  minimize alcohol.  do not use tobacco products.  have a colonoscopy at least every 10 years from age 37.  Women should have an annual mammogram from age 63.  keep firearms safely stored.  always use seat belts.  have working smoke alarms in your home.  see an eye doctor and dentist regularly.  never drive under the influence of alcohol or drugs (including prescription drugs).  those with fair skin should take precautions against the sun.

## 2015-04-06 NOTE — Progress Notes (Signed)
Subjective:    Patient ID: Jasmine Schwartz, female    DOB: 27-Aug-1965, 49 y.o.   MRN: [id number]  HPI Pt is here for regular wellness examination, and is feeling pretty well in general, and says chronic med probs are stable, except as noted below Past Medical History  Diagnosis Date  . Anxiety   . Depression   . Dyslipidemia   . Anxiety and depression 07/06/2011  . Hx of insertion of insulin pump ~ 2001 to present (06/09/2013)  . PONV (postoperative nausea and vomiting)   . Venous insufficiency   . Lower extremity edema   . Diabetes mellitus     IDDM  . Type I diabetes mellitus     uses insulin pump  . Breast cancer 01/01/13    /metastatic 1/1 lymph nodes    Past Surgical History  Procedure Laterality Date  . Lumbar disc surgery  2009    Dr Maxie Better  . Cesarean section  1989  . Tubal ligation  1991  . Breast biopsy Right 01/01/13    Invasive mammary ca,metastatic in 1/1 lymph node  . Breast biopsy Left 01/09/13    Fibroadenoma,microcalcifications  . Portacath placement Left 01/2013  . Mastectomy modified radical Right 06/09/2013  . Mastectomy complete / simple Left 06/09/2013  . Mastectomy modified radical Right 06/09/2013    Procedure: RIGHT MASTECTOMY MODIFIED RADICAL;  Surgeon: Adin Hector, MD;  Location: [facility];  Service: General;  Laterality: Right;  . Simple mastectomy with axillary sentinel node biopsy Left 06/09/2013    Procedure: LEFT SIMPLE MASTECTOMY;  Surgeon: Adin Hector, MD;  Location: [facility];  Service: General;  Laterality: Left;    Social History   Social History  . Marital Status: Married    Spouse Name: N/A  . Number of Children: 2  . Years of Education: N/A   Occupational History  .     Social History Main Topics  . Smoking status: Never Smoker   . Smokeless tobacco: Never Used  . Alcohol Use: No  . Drug Use: No  . Sexual Activity: Yes   Other Topics Concern  . Not on file   Social History Narrative    Current Outpatient  Prescriptions on File Prior to Visit  Medication Sig Dispense Refill  . buPROPion (WELLBUTRIN XL) 150 MG 24 hr tablet Take 1 tablet (150 mg total) by mouth daily. 30 tablet 11  . fluocinonide cream (LIDEX) 8.54 % Apply 1 application topically 4 (four) times daily. As needed for rash 30 g 0  . glucose blood (NOVA MAX TEST) test strip Use as directed up to 8 times a day    . ibuprofen (ADVIL,MOTRIN) 600 MG tablet Take 1 tablet (600 mg total) by mouth every 6 (six) hours as needed. 30 tablet 0  . Insulin Infusion Pump (MINIMED INSULIN PUMP) DEVI Inject 1 Units/hr into the vein continuous. Pt give extra per cards at meal times. Basic meals are about 5 units    . Insulin Infusion Pump Supplies (MINIMED INFUSION SET-MMT 397) MISC 1 Device by Does not apply route every 3 (three) days.    . Insulin Infusion Pump Supplies (PARADIGM PUMP RESERVOIR 1.76ML) MISC 1 Device by Does not apply route every 3 (three) days.    . insulin lispro (HUMALOG) 100 UNIT/ML injection For use in pump, total of 60 units per day 20 mL 11  . lidocaine-prilocaine (EMLA) cream Apply 1 application topically as needed. 30 g 1  . HYDROcodone-acetaminophen (NORCO/VICODIN) 5-325 MG per  tablet Take 1 tablet by mouth every 6 (six) hours as needed. (Patient not taking: Reported on 04/06/2015) 12 tablet 0   No current facility-administered medications on file prior to visit.    No Known Allergies  Family History  Problem Relation Age of Onset  . Cancer Neg Hx   . Diabetes Neg Hx   . Coronary artery disease Mother   . Congestive Heart Failure Maternal Grandmother   . Brain cancer Maternal Grandfather     dx in his early 38s    BP 126/80 mmHg  Pulse 79  Temp(Src) 98.1 F (36.7 C) (Oral)  Ht 5\' 6"  (1.676 m)  Wt 159 lb (72.122 kg)  BMI 25.68 kg/m2  SpO2 96%  Review of Systems  Constitutional: Negative for fever.  HENT: Negative for hearing loss.   Eyes: Negative for visual disturbance.  Respiratory: Negative for shortness  of breath.   Gastrointestinal: Negative for blood in stool.  Endocrine: Negative for cold intolerance.  Genitourinary: Negative for hematuria.  Musculoskeletal: Negative for back pain.  Skin: Negative for rash.  Allergic/Immunologic: Negative for environmental allergies.  Neurological: Negative for syncope.  Hematological: Does not bruise/bleed easily.  Psychiatric/Behavioral: Negative for dysphoric mood.       Objective:   Physical Exam VS: see vs page GEN: no distress HEAD: head: no deformity eyes: no periorbital swelling, no proptosis external nose and ears are normal mouth: no lesion seen NECK: supple, thyroid is not enlarged CHEST WALL: no deformity LUNGS:  Clear to auscultation BREASTS: sees gyn CV: reg rate and rhythm, no murmur ABD: abdomen is soft, nontender.  no hepatosplenomegaly.  not distended.  no hernia GENITALIA/RECTAL: sees gyn MUSCULOSKELETAL: muscle bulk and strength are grossly normal.  no obvious joint swelling.  gait is normal and steady PULSES: no carotid bruit NEURO:  cn 2-12 grossly intact.   readily moves all 4's.   SKIN:  Normal texture and temperature.  No rash or suspicious lesion is visible.   NODES:  None palpable at the neck PSYCH: alert, well-oriented.  Does not appear anxious nor depressed.      Assessment & Plan:  Wellness visit today, with problems stable, except as noted.   SEPARATE EVALUATION FOLLOWS--EACH PROBLEM HERE IS NEW, NOT RESPONDING TO TREATMENT, OR POSES SIGNIFICANT RISK TO THE PATIENT'S HEALTH: HISTORY OF THE PRESENT ILLNESS: Pt returns for f/u of diabetes mellitus: DM type: 1 Dx'ed: 4098 Complications: none Therapy: insulin since dx DKA: never Severe hypoglycemia: never Pancreatitis: never Other: she has been on pump rx since 2004; she declined continuous glucose monitor, due to cost; she has finisher rx for her breast cancer, for now.  Interval history: she takes these pump settings:  basal rate of 0.95 units/hr,   except 2 units/hr, 3AM-6AM  mealtime bolus of 1 unit/ 13 grams carbohydrate  correction bolus (which some people call "sensitivity," or "insulin sensitivity ratio," or just "isr") of 1 unit for each 50 by which your glucose exceeds 100).  She denies hypoglycemia.  no cbg record, but states cbg's are well-controlled.  She takes a total of approx 45 units total per day.  pt states she feels well in general, but she did not tolerate synthroid.   PAST MEDICAL HISTORY reviewed and up to date today REVIEW OF SYSTEMS:  Denies weight change and LOC PHYSICAL EXAMINATION: VITAL SIGNS:  See vs page.  GENERAL: no distress. Pulses: dorsalis pedis intact bilat.   MSK: no deformity of the feet CV: no leg edema Skin:  no  ulcer on the feet.  normal color and temp on the feet. Neuro: sensation is intact to touch on the feet  LAB/XRAY RESULTS: i personally reviewed electrocardiogram tracing: normal.   A1c=7.7% IMPRESSION:  DM: she needs increased rx Mild hypothyroidism: she did not tolerate synthroid.  PLAN:  Please take these pump settings: same basal rate of 0.95 units/hr, except 2 units/hr, 3AM-6AM.   increase mealtime bolus to 1 unit/ 12 grams carbohydrate.  continue correction bolus (which some people call "sensitivity," or "insulin sensitivity ratio," or just "isr") of 1 unit for each 50 by which your glucose exceeds 100). Recheck synthroid

## 2015-04-06 NOTE — Progress Notes (Signed)
we discussed code status.  pt requests full code, but would not want to be started or maintained on artificial life-support measures if there was not a reasonable chance of recovery 

## 2015-04-11 ENCOUNTER — Telehealth: Payer: Self-pay

## 2015-04-11 NOTE — Telephone Encounter (Signed)
Pt will have HgA1c rechecked on f/u appt on 07/12/2015

## 2015-04-28 ENCOUNTER — Encounter: Payer: Self-pay | Admitting: Genetic Counselor

## 2015-04-28 DIAGNOSIS — Z1379 Encounter for other screening for genetic and chromosomal anomalies: Secondary | ICD-10-CM | POA: Insufficient documentation

## 2015-04-29 ENCOUNTER — Other Ambulatory Visit: Payer: Self-pay

## 2015-04-29 MED ORDER — GLUCOSE BLOOD VI STRP: ORAL_STRIP | Status: DC

## 2015-05-23 ENCOUNTER — Telehealth: Payer: Self-pay | Admitting: Endocrinology

## 2015-05-23 MED ORDER — BUPROPION HCL ER (XL) 150 MG PO TB24: 150.0000 mg | ORAL_TABLET | Freq: Every day | ORAL | Status: DC

## 2015-05-23 NOTE — Telephone Encounter (Signed)
Rx sent per pt's request.  

## 2015-05-23 NOTE — Telephone Encounter (Signed)
Patient called stating she would like a refill on her Rx   Rx: Welbutrin   Pharmacy: [facility] [place]    Thank you

## 2015-06-01 DIAGNOSIS — Z0279 Encounter for issue of other medical certificate: Secondary | ICD-10-CM

## 2015-06-16 LAB — HM DIABETES EYE EXAM

## 2015-06-23 ENCOUNTER — Telehealth: Payer: Self-pay | Admitting: Hematology and Oncology

## 2015-06-23 ENCOUNTER — Encounter: Payer: Self-pay | Admitting: Hematology and Oncology

## 2015-06-23 ENCOUNTER — Ambulatory Visit (HOSPITAL_BASED_OUTPATIENT_CLINIC_OR_DEPARTMENT_OTHER): Payer: 59 | Admitting: Hematology and Oncology

## 2015-06-23 VITALS — BP 124/78 | HR 94 | Temp 97.8°F | Resp 18 | Ht 66.0 in | Wt 163.8 lb

## 2015-06-23 DIAGNOSIS — C50411 Malignant neoplasm of upper-outer quadrant of right female breast: Secondary | ICD-10-CM | POA: Diagnosis not present

## 2015-06-23 DIAGNOSIS — Z171 Estrogen receptor negative status [ER-]: Secondary | ICD-10-CM | POA: Diagnosis not present

## 2015-06-23 DIAGNOSIS — C773 Secondary and unspecified malignant neoplasm of axilla and upper limb lymph nodes: Secondary | ICD-10-CM | POA: Diagnosis not present

## 2015-06-23 DIAGNOSIS — E039 Hypothyroidism, unspecified: Secondary | ICD-10-CM

## 2015-06-23 NOTE — Progress Notes (Signed)
Patient Care Team: [name], MD as PCP - General [facility]. Rolena Infante, MD as Referring Physician (Obstetrics and Gynecology)  DIAGNOSIS: No matching staging information was found for the patient.  SUMMARY OF ONCOLOGIC HISTORY:   Breast cancer of upper-outer quadrant of right female breast ([place])   01/01/2013 Initial Diagnosis Mammogram showed diffuse skin thickening and enlarged right axillary lymph nodes with right breast density ultrasound showed 4.5 cm mass right axillary 3.1 cm biopsy showed IMC grade 2-3 lymph node also positive ER/PR neg HER-2 pos Ki-67 76%, MRI 8 cm mas   01/16/2013 - 05/01/2013 Neo-Adjuvant Chemotherapy Taxotere carboplatin Herceptin and Perjeta x6 cycles   05/29/2013 Surgery Bilateral mastectomies, left breasts no evidence of cancer right breast no evidence of cancer 40 lymph nodes negative complete pathologic response    - 02/02/2014 Chemotherapy Adjuvant Herceptin   07/27/2013 - 09/11/2013 Radiation Therapy Adjuvant radiation therapy    CHIEF COMPLIANT: Surveillance of breast cancer  INTERVAL HISTORY: Jasmine Schwartz is a 49 year old with above-mentioned history of right-sided breast cancer treated with neoadjuvant chemotherapy followed by bilateral mastectomies which showed no evidence of disease and had a pathologic complete response. She completed adjuvant Herceptin treatment along with radiation therapy and she is here today for routine surveillance. Because she was ER/PR negative there is no role of antiestrogen therapy. Her health as being excellent she denies any new complaints or concerns.  REVIEW OF SYSTEMS:   Constitutional: Denies fevers, chills or abnormal weight loss Eyes: Denies blurriness of vision Ears, nose, mouth, throat, and face: Denies mucositis or sore throat Respiratory: Denies cough, dyspnea or wheezes Cardiovascular: Denies palpitation, chest discomfort or lower extremity swelling Gastrointestinal:  Denies nausea, heartburn or change in bowel  habits Skin: Denies abnormal skin rashes Lymphatics: Denies new lymphadenopathy or easy bruising Neurological:Denies numbness, tingling or new weaknesses Behavioral/Psych: Mood is stable, no new changes  Breast:  denies any pain or lumps or nodules in either breasts All other systems were reviewed with the patient and are negative.  I have reviewed the past medical history, past surgical history, social history and family history with the patient and they are unchanged from previous note.  ALLERGIES:  has No Known Allergies.  MEDICATIONS:  Current Outpatient Prescriptions  Medication Sig Dispense Refill  . buPROPion (WELLBUTRIN XL) 150 MG 24 hr tablet Take 1 tablet (150 mg total) by mouth daily. 30 tablet 11  . fluocinonide cream (LIDEX) 3.78 % Apply 1 application topically 4 (four) times daily. As needed for rash 30 g 0  . glucose blood (BAYER CONTOUR NEXT TEST) test strip Use to test blood sugar 4 to 5 time daily 100 each 12  . HYDROcodone-acetaminophen (NORCO/VICODIN) 5-325 MG per tablet Take 1 tablet by mouth every 6 (six) hours as needed. (Patient not taking: Reported on 04/06/2015) 12 tablet 0  . ibuprofen (ADVIL,MOTRIN) 600 MG tablet Take 1 tablet (600 mg total) by mouth every 6 (six) hours as needed. 30 tablet 0  . Insulin Infusion Pump (MINIMED INSULIN PUMP) DEVI Inject 1 Units/hr into the vein continuous. Pt give extra per cards at meal times. Basic meals are about 5 units    . Insulin Infusion Pump Supplies (MINIMED INFUSION SET-MMT 397) MISC 1 Device by Does not apply route every 3 (three) days.    . Insulin Infusion Pump Supplies (PARADIGM PUMP RESERVOIR 1.76ML) MISC 1 Device by Does not apply route every 3 (three) days.    . insulin lispro (HUMALOG) 100 UNIT/ML injection For use in pump, total  of 60 units per day 20 mL 11  . lidocaine-prilocaine (EMLA) cream Apply 1 application topically as needed. 30 g 1   No current facility-administered medications for this visit.     PHYSICAL EXAMINATION: ECOG PERFORMANCE STATUS: 0 - Asymptomatic  Filed Vitals:   06/23/15 1452  BP: 124/78  Pulse: 94  Temp: 97.8 F (36.6 C)  Resp: 18   Filed Weights   06/23/15 1452  Weight: 163 lb 12.8 oz (74.299 kg)    GENERAL:alert, no distress and comfortable SKIN: skin color, texture, turgor are normal, no rashes or significant lesions EYES: normal, Conjunctiva are pink and non-injected, sclera clear OROPHARYNX:no exudate, no erythema and lips, buccal mucosa, and tongue normal  NECK: supple, thyroid normal size, non-tender, without nodularity LYMPH:  no palpable lymphadenopathy in the cervical, axillary or inguinal LUNGS: clear to auscultation and percussion with normal breathing effort HEART: regular rate & rhythm and no murmurs and no lower extremity edema ABDOMEN:abdomen soft, non-tender and normal bowel sounds Musculoskeletal:no cyanosis of digits and no clubbing  NEURO: alert & oriented x 3 with fluent speech, no focal motor/sensory deficits   LABORATORY DATA:  I have reviewed the data as listed   Chemistry      Component Value Date/Time   NA 139 01/04/2015 1421   NA 136 03/24/2014 1457   K 4.2 01/04/2015 1421   K 4.2 03/24/2014 1457   CL 98 03/24/2014 1457   CL 99 01/23/2013 1251   CO2 26 01/04/2015 1421   CO2 26 03/24/2014 1457   BUN 15.3 01/04/2015 1421   BUN 21 03/24/2014 1457   CREATININE 0.9 01/04/2015 1421   CREATININE 0.81 03/24/2014 1457      Component Value Date/Time   CALCIUM 8.9 01/04/2015 1421   CALCIUM 9.5 03/24/2014 1457   ALKPHOS 67 01/04/2015 1421   ALKPHOS 96 03/24/2014 1457   AST 17 01/04/2015 1421   AST 16 03/24/2014 1457   ALT 16 01/04/2015 1421   ALT 12 03/24/2014 1457   BILITOT 0.62 01/04/2015 1421   BILITOT 0.5 03/24/2014 1457       Lab Results  Component Value Date   WBC 7.9 01/04/2015   HGB 13.9 01/04/2015   HCT 40.5 01/04/2015   MCV 87.5 01/04/2015   PLT 352 01/04/2015   NEUTROABS 5.5 01/04/2015    ASSESSMENT & PLAN:  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 Patient has underwent neoadjuvant treatment, followed by bilateral mastectomies with a complete pathologic response. She then completed a calendar year of Herceptin.  Breast Cancer Surveillance: 1. Breast exam 06/23/15: Normal chest wall exam 2. Mammogram: No role of imaging.  Skin rash  Hypothyroidism: being managed with primary care  RTC in 1 year   No orders of the defined types were placed in this encounter.   The patient has a good understanding of the overall plan. she agrees with it. she will call with any problems that may develop before the next visit here.   Rulon Eisenmenger, MD 06/23/2015

## 2015-06-23 NOTE — Telephone Encounter (Signed)
Appointments made and avs printed for patient °

## 2015-06-23 NOTE — Assessment & Plan Note (Signed)
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 Patient has underwent neoadjuvant treatment, followed by bilateral mastectomies with a complete pathologic response. She then completed a calendar year of Herceptin.  Breast Cancer Surveillance: 1. Breast exam 01/03/15: Normal chest wall exam 2. Mammogram: No role of imaging.  Skin rash  Hypothyroidism: being managed with primary care I reviewed her CBC and CMP which were normal. RTC in 1 year

## 2015-06-29 ENCOUNTER — Telehealth: Payer: Self-pay | Admitting: Endocrinology

## 2015-06-29 MED ORDER — ACCU-CHEK MULTICLIX LANCETS MISC: Status: DC

## 2015-06-29 MED ORDER — ACCU-CHEK AVIVA DEVI: Status: AC

## 2015-06-29 MED ORDER — GLUCOSE BLOOD VI STRP: ORAL_STRIP | Status: DC

## 2015-06-29 NOTE — Telephone Encounter (Signed)
Rx submitted for pt's test strips, meter and lancets.

## 2015-06-29 NOTE — Telephone Encounter (Signed)
Patient called stating that her current test strips will not be approved  She will need to switch the meter and strips   Rx: Accucheck meter and strips   Pharmacy: [facility] [place]   Thank you

## 2015-07-01 ENCOUNTER — Ambulatory Visit: Payer: 59 | Admitting: Endocrinology

## 2015-07-12 ENCOUNTER — Ambulatory Visit: Payer: 59 | Admitting: Endocrinology

## 2015-10-28 ENCOUNTER — Encounter: Payer: Self-pay | Admitting: Endocrinology

## 2015-10-28 ENCOUNTER — Ambulatory Visit (INDEPENDENT_AMBULATORY_CARE_PROVIDER_SITE_OTHER): Payer: 59 | Admitting: Endocrinology

## 2015-10-28 VITALS — BP 122/70 | HR 81 | Temp 98.6°F | Ht 66.0 in | Wt 164.0 lb

## 2015-10-28 DIAGNOSIS — Z0189 Encounter for other specified special examinations: Secondary | ICD-10-CM

## 2015-10-28 DIAGNOSIS — Z0000 Encounter for general adult medical examination without abnormal findings: Secondary | ICD-10-CM

## 2015-10-28 DIAGNOSIS — E1042 Type 1 diabetes mellitus with diabetic polyneuropathy: Secondary | ICD-10-CM | POA: Diagnosis not present

## 2015-10-28 LAB — HEPATIC FUNCTION PANEL
ALBUMIN: 3.9 g/dL (ref 3.5–5.2)
ALK PHOS: 74 U/L (ref 39–117)
ALT: 11 U/L (ref 0–35)
AST: 14 U/L (ref 0–37)
BILIRUBIN TOTAL: 0.7 mg/dL (ref 0.2–1.2)
Bilirubin, Direct: 0.2 mg/dL (ref 0.0–0.3)
Total Protein: 7.1 g/dL (ref 6.0–8.3)

## 2015-10-28 LAB — URINALYSIS, ROUTINE W REFLEX MICROSCOPIC
Bilirubin Urine: NEGATIVE
HGB URINE DIPSTICK: NEGATIVE
LEUKOCYTES UA: NEGATIVE
NITRITE: NEGATIVE
SPECIFIC GRAVITY, URINE: 1.025 (ref 1.000–1.030)
TOTAL PROTEIN, URINE-UPE24: NEGATIVE
URINE GLUCOSE: NEGATIVE
Urobilinogen, UA: 0.2 (ref 0.0–1.0)
pH: 6 (ref 5.0–8.0)

## 2015-10-28 LAB — CBC WITH DIFFERENTIAL/PLATELET
BASOS PCT: 0.5 % (ref 0.0–3.0)
Basophils Absolute: 0 10*3/uL (ref 0.0–0.1)
EOS PCT: 3.1 % (ref 0.0–5.0)
Eosinophils Absolute: 0.2 10*3/uL (ref 0.0–0.7)
HCT: 38 % (ref 36.0–46.0)
HEMOGLOBIN: 12.9 g/dL (ref 12.0–15.0)
LYMPHS ABS: 1.4 10*3/uL (ref 0.7–4.0)
Lymphocytes Relative: 18.3 % (ref 12.0–46.0)
MCHC: 33.9 g/dL (ref 30.0–36.0)
MCV: 85.1 fl (ref 78.0–100.0)
MONOS PCT: 6.7 % (ref 3.0–12.0)
Monocytes Absolute: 0.5 10*3/uL (ref 0.1–1.0)
Neutro Abs: 5.3 10*3/uL (ref 1.4–7.7)
Neutrophils Relative %: 71.4 % (ref 43.0–77.0)
Platelets: 436 10*3/uL — ABNORMAL HIGH (ref 150.0–400.0)
RBC: 4.47 Mil/uL (ref 3.87–5.11)
RDW: 13.2 % (ref 11.5–15.5)
WBC: 7.4 10*3/uL (ref 4.0–10.5)

## 2015-10-28 LAB — POCT GLYCOSYLATED HEMOGLOBIN (HGB A1C): Hemoglobin A1C: 8.2

## 2015-10-28 LAB — BASIC METABOLIC PANEL
BUN: 19 mg/dL (ref 6–23)
CALCIUM: 9.3 mg/dL (ref 8.4–10.5)
CO2: 30 mEq/L (ref 19–32)
Chloride: 99 mEq/L (ref 96–112)
Creatinine, Ser: 0.77 mg/dL (ref 0.40–1.20)
GFR: 84.14 mL/min (ref >60.00)
GLUCOSE: 211 mg/dL — AB (ref 70–99)
POTASSIUM: 4 meq/L (ref 3.5–5.1)
SODIUM: 136 meq/L (ref 135–145)

## 2015-10-28 LAB — LIPID PANEL
CHOL/HDL RATIO: 2
CHOLESTEROL: 192 mg/dL (ref 0–200)
HDL: 78.9 mg/dL (ref >39.00)
LDL CALC: 101 mg/dL — AB (ref 0–99)
NonHDL: 112.66
Triglycerides: 56 mg/dL (ref 0.0–149.0)
VLDL: 11.2 mg/dL (ref 0.0–40.0)

## 2015-10-28 LAB — MICROALBUMIN / CREATININE URINE RATIO
Creatinine,U: 203.8 mg/dL
Microalb Creat Ratio: 0.5 mg/g (ref 0.0–30.0)
Microalb, Ur: 1.1 mg/dL (ref 0.0–1.9)

## 2015-10-28 LAB — TSH: TSH: 11.84 u[IU]/mL — ABNORMAL HIGH (ref 0.35–4.50)

## 2015-10-28 MED ORDER — LEVOTHYROXINE SODIUM 50 MCG PO TABS: 50.0000 ug | ORAL_TABLET | Freq: Every day | ORAL | Status: DC

## 2015-10-28 NOTE — Progress Notes (Signed)
Subjective:    Patient ID: Jasmine Schwartz, female    DOB: 08/25/1965, 50 y.o.   MRN: [id number]  HPI  Pt returns for f/u of diabetes mellitus: DM type: 1 Dx'ed: 0000000 Complications: none Therapy: insulin since dx DKA: never Severe hypoglycemia: never Pancreatitis: never Other: she has been on pump rx since 2004; she declined continuous glucose monitor, due to cost; she has finisher rx for her breast cancer, for now.  Interval history: she takes these pump settings:  basal rate of 0.95 units/hr,  except 2 units/hr, 3AM-6AM  mealtime bolus of 1 unit/ 13 grams carbohydrate  correction bolus (which some people call "sensitivity," or "insulin sensitivity ratio," or just "isr") of 1 unit for each 50 by which your glucose exceeds 100).  She takes an average of approx 60 units total per day. no cbg record, but states cbg's are highest in the middle of the night.  no cbg record, but states cbg's are highest at approx 2 am (200's). Past Medical History  Diagnosis Date  . Anxiety   . Depression   . Dyslipidemia   . Anxiety and depression 07/06/2011  . Hx of insertion of insulin pump ~ 2001 to present (06/09/2013)  . PONV (postoperative nausea and vomiting)   . Venous insufficiency   . Lower extremity edema   . Diabetes mellitus     IDDM  . Type I diabetes mellitus (HCC)     uses insulin pump  . Breast cancer ([place]) 01/01/13    /metastatic 1/1 lymph nodes    Past Surgical History  Procedure Laterality Date  . Lumbar disc surgery  2009    Dr Maxie Better  . Cesarean section  1989  . Tubal ligation  1991  . Breast biopsy Right 01/01/13    Invasive mammary ca,metastatic in 1/1 lymph node  . Breast biopsy Left 01/09/13    Fibroadenoma,microcalcifications  . Portacath placement Left 01/2013  . Mastectomy modified radical Right 06/09/2013  . Mastectomy complete / simple Left 06/09/2013  . Mastectomy modified radical Right 06/09/2013    Procedure: RIGHT MASTECTOMY MODIFIED RADICAL;  Surgeon: Adin Hector, MD;  Location: [facility];  Service: General;  Laterality: Right;  . Simple mastectomy with axillary sentinel node biopsy Left 06/09/2013    Procedure: LEFT SIMPLE MASTECTOMY;  Surgeon: Adin Hector, MD;  Location: [facility];  Service: General;  Laterality: Left;    Social History   Social History  . Marital Status: Married    Spouse Name: N/A  . Number of Children: 2  . Years of Education: N/A   Occupational History  .     Social History Main Topics  . Smoking status: Never Smoker   . Smokeless tobacco: Never Used  . Alcohol Use: No  . Drug Use: No  . Sexual Activity: Yes   Other Topics Concern  . Not on file   Social History Narrative    Current Outpatient Prescriptions on File Prior to Visit  Medication Sig Dispense Refill  . Blood Glucose Monitoring Suppl (ACCU-CHEK AVIVA) device Use to check blood sugar 5 times per day 1 each 2  . buPROPion (WELLBUTRIN XL) 150 MG 24 hr tablet Take 1 tablet (150 mg total) by mouth daily. 30 tablet 11  . glucose blood (ACCU-CHEK AVIVA) test strip Use to check blood sugar 5 times per day 200 each 3  . Insulin Infusion Pump (MINIMED INSULIN PUMP) DEVI Inject 1 Units/hr into the vein continuous. Pt give extra per cards at  meal times. Basic meals are about 5 units    . Insulin Infusion Pump Supplies (MINIMED INFUSION SET-MMT 397) MISC 1 Device by Does not apply route every 3 (three) days.    . Insulin Infusion Pump Supplies (PARADIGM PUMP RESERVOIR 1.76ML) MISC 1 Device by Does not apply route every 3 (three) days.    . insulin lispro (HUMALOG) 100 UNIT/ML injection For use in pump, total of 60 units per day 20 mL 11  . Lancets (ACCU-CHEK MULTICLIX) lancets Use to check blood sugar 5 times per day 200 each 2   No current facility-administered medications on file prior to visit.    No Known Allergies  Family History  Problem Relation Age of Onset  . Cancer Neg Hx   . Diabetes Neg Hx   . Coronary artery disease Mother   .  Congestive Heart Failure Maternal Grandmother   . Brain cancer Maternal Grandfather     dx in his early 72s    BP 122/70 mmHg  Pulse 81  Temp(Src) 98.6 F (37 C) (Oral)  Ht 5\' 6"  (1.676 m)  Wt 164 lb (74.39 kg)  BMI 26.48 kg/m2  SpO2 96%  Review of Systems She seldom has hypoglycemia, and these episodes are mild    Objective:   Physical Exam VITAL SIGNS:  See vs page GENERAL: no distress Pulses: dorsalis pedis intact bilat.   MSK: no deformity of the feet CV: no leg edema Skin:  no ulcer on the feet.  normal color and temp on the feet. Neuro: sensation is intact to touch on the feet.  Lab Results  Component Value Date   WBC 7.4 10/28/2015   HGB 12.9 10/28/2015   HCT 38.0 10/28/2015   PLT 436.0* 10/28/2015   GLUCOSE 211* 10/28/2015   CHOL 192 10/28/2015   TRIG 56.0 10/28/2015   HDL 78.90 10/28/2015   LDLCALC 101* 10/28/2015   ALT 11 10/28/2015   AST 14 10/28/2015   NA 136 10/28/2015   K 4.0 10/28/2015   CL 99 10/28/2015   CREATININE 0.77 10/28/2015   BUN 19 10/28/2015   CO2 30 10/28/2015   TSH 11.84* 10/28/2015   INR 1.05 01/15/2013   HGBA1C 8.2 10/28/2015   MICROALBUR 1.1 10/28/2015      Assessment & Plan:  DM: worse Dyslipidemia: well-controlled Hypothyroidism: she needs increased rx.   Patient is advised the following: Patient Instructions  Please take these pump settings: basal rate of 1.1 units/hr, except 2 units/hr, 3AM-6AM.   continue mealtime bolus of 1 unit/ 12 grams carbohydrate.  continue correction bolus (which some people call "sensitivity," or "insulin sensitivity ratio," or just "isr") of 1 unit for each 50 by which your glucose exceeds 100).   check your blood sugar 4 times a day: before the 3 meals, and at bedtime.  also check if you have symptoms of your blood sugar being too high or too low.  please keep a record of the readings and bring it to your next appointment here.  You can write it on any piece of paper.  please call us sooner  if your blood sugar goes below 70, or if you have a lot of readings over 200.   blood tests are requested for you today.  We'll let you know about the results.  Please come back for a follow-up appointment in 3 months.      addendum: i have sent a prescription to your pharmacy, to increase synthroid.

## 2015-10-28 NOTE — Patient Instructions (Addendum)
Please take these pump settings: basal rate of 1.1 units/hr, except 2 units/hr, 3AM-6AM.   continue mealtime bolus of 1 unit/ 12 grams carbohydrate.  continue correction bolus (which some people call "sensitivity," or "insulin sensitivity ratio," or just "isr") of 1 unit for each 50 by which your glucose exceeds 100).   check your blood sugar 4 times a day: before the 3 meals, and at bedtime.  also check if you have symptoms of your blood sugar being too high or too low.  please keep a record of the readings and bring it to your next appointment here.  You can write it on any piece of paper.  please call us sooner if your blood sugar goes below 70, or if you have a lot of readings over 200.   blood tests are requested for you today.  We'll let you know about the results.  Please come back for a follow-up appointment in 3 months.

## 2015-10-30 ENCOUNTER — Telehealth: Payer: Self-pay | Admitting: Endocrinology

## 2015-10-30 NOTE — Telephone Encounter (Signed)
please call patient: good results

## 2015-10-31 NOTE — Telephone Encounter (Signed)
Pt advised of note below and voiced understanding.  

## 2016-01-12 ENCOUNTER — Other Ambulatory Visit: Payer: Self-pay | Admitting: Endocrinology

## 2016-01-27 ENCOUNTER — Ambulatory Visit: Payer: 59 | Admitting: Endocrinology

## 2016-02-03 ENCOUNTER — Other Ambulatory Visit: Payer: Self-pay | Admitting: Endocrinology

## 2016-02-15 ENCOUNTER — Encounter: Payer: Self-pay | Admitting: Endocrinology

## 2016-02-15 ENCOUNTER — Ambulatory Visit (INDEPENDENT_AMBULATORY_CARE_PROVIDER_SITE_OTHER): Payer: 59 | Admitting: Endocrinology

## 2016-02-15 VITALS — BP 110/62 | HR 89 | Ht 64.0 in | Wt 161.0 lb

## 2016-02-15 DIAGNOSIS — R079 Chest pain, unspecified: Secondary | ICD-10-CM | POA: Diagnosis not present

## 2016-02-15 DIAGNOSIS — E039 Hypothyroidism, unspecified: Secondary | ICD-10-CM | POA: Diagnosis not present

## 2016-02-15 NOTE — Patient Instructions (Addendum)
Please take these pump settings:  basal rate of 1.1 units/hr, except 1.8 units/hr, 3AM-6AM.   continue mealtime bolus of 1 unit/ 12 grams carbohydrate.  continue correction bolus (which some people call "sensitivity," or "insulin sensitivity ratio," or just "isr") of 1 unit for each 50 by which your glucose exceeds 100).   check your blood sugar 4 times a day: before the 3 meals, and at bedtime.  also check if you have symptoms of your blood sugar being too high or too low.  please keep a record of the readings and bring it to your next appointment here.  You can write it on any piece of paper.  please call us sooner if your blood sugar goes below 70, or if you have a lot of readings over 200.   A thyroid blood test is requested for you today.  We'll let you know about the results. Let's recheck the treadmill test.  you will receive a phone call, about a day and time for an appointment.   Please come back for a regular physical appointment in 3 months.

## 2016-02-15 NOTE — Progress Notes (Signed)
Subjective:    Patient ID: Jasmine Schwartz, female    DOB: 1965-08-21, 50 y.o.   MRN: [id number]  HPI Pt returns for f/u of diabetes mellitus:  DM type: 1 Dx'ed: 0000000 Complications: none.  Therapy: insulin since dx DKA: never Severe hypoglycemia: never Pancreatitis: never Other: she has been on pump rx since 2004; she declined continuous glucose monitor, due to cost; she has finisher rx for her breast cancer, for now.  Interval history: she takes these pump settings:  basal rate of 1.1 units/hr, except 1.8 units/hr, 3AM-6AM (she had to reduce, due to hypoglycemia).   continue mealtime bolus of 1 unit/ 12 grams carbohydrate.  continue correction bolus (which some people call "sensitivity," or "insulin sensitivity ratio," or just "isr") of 1 unit for each 50 by which your glucose exceeds 100).   Since the adjustment, she has not had hypoglycemia.  no cbg record, but states cbg's are well-controlled.  There is no trend throughout the day.  She averages a total of approx 50 units per day.   Past Medical History  Diagnosis Date  . Anxiety   . Depression   . Dyslipidemia   . Anxiety and depression 07/06/2011  . Hx of insertion of insulin pump ~ 2001 to present (06/09/2013)  . PONV (postoperative nausea and vomiting)   . Venous insufficiency   . Lower extremity edema   . Diabetes mellitus     IDDM  . Type I diabetes mellitus (HCC)     uses insulin pump  . Breast cancer ([place]) 01/01/13    /metastatic 1/1 lymph nodes    Past Surgical History  Procedure Laterality Date  . Lumbar disc surgery  2009    Dr Maxie Better  . Cesarean section  1989  . Tubal ligation  1991  . Breast biopsy Right 01/01/13    Invasive mammary ca,metastatic in 1/1 lymph node  . Breast biopsy Left 01/09/13    Fibroadenoma,microcalcifications  . Portacath placement Left 01/2013  . Mastectomy modified radical Right 06/09/2013  . Mastectomy complete / simple Left 06/09/2013  . Mastectomy modified radical Right 06/09/2013      Procedure: RIGHT MASTECTOMY MODIFIED RADICAL;  Surgeon: Adin Hector, MD;  Location: [facility];  Service: General;  Laterality: Right;  . Simple mastectomy with axillary sentinel node biopsy Left 06/09/2013    Procedure: LEFT SIMPLE MASTECTOMY;  Surgeon: Adin Hector, MD;  Location: [facility];  Service: General;  Laterality: Left;    Social History   Social History  . Marital Status: Married    Spouse Name: N/A  . Number of Children: 2  . Years of Education: N/A   Occupational History  .     Social History Main Topics  . Smoking status: Never Smoker   . Smokeless tobacco: Never Used  . Alcohol Use: No  . Drug Use: No  . Sexual Activity: Yes   Other Topics Concern  . Not on file   Social History Narrative    Current Outpatient Prescriptions on File Prior to Visit  Medication Sig Dispense Refill  . ACCU-CHEK AVIVA PLUS test strip USE TO CHECK SUGAR 5 TIMES PER DAY 200 each 3  . Blood Glucose Monitoring Suppl (ACCU-CHEK AVIVA) device Use to check blood sugar 5 times per day 1 each 2  . buPROPion (WELLBUTRIN XL) 150 MG 24 hr tablet Take 1 tablet (150 mg total) by mouth daily. 30 tablet 11  . HUMALOG 100 UNIT/ML injection FOR USE IN PUMP, TOTAL OF 60  UNITS ONCE A DAY (Patient taking differently: FOR USE IN PUMP, TOTAL OF 60 UNITS PER DAY) 20 mL 0  . Insulin Infusion Pump Supplies (MINIMED INFUSION SET-MMT 397) MISC 1 Device by Does not apply route every 3 (three) days.    . Insulin Infusion Pump Supplies (PARADIGM PUMP RESERVOIR 1.76ML) MISC 1 Device by Does not apply route every 3 (three) days.    . Lancets (ACCU-CHEK MULTICLIX) lancets Use to check blood sugar 5 times per day 200 each 2  . levothyroxine (SYNTHROID) 50 MCG tablet Take 1 tablet (50 mcg total) by mouth daily before breakfast. 30 tablet 11   No current facility-administered medications on file prior to visit.    No Known Allergies  Family History  Problem Relation Age of Onset  . Cancer Neg Hx   .  Diabetes Neg Hx   . Coronary artery disease Mother   . Congestive Heart Failure Maternal Grandmother   . Brain cancer Maternal Grandfather     dx in his early 41s    BP 110/62 mmHg  Pulse 89  Ht 5\' 4"  (1.626 m)  Wt 161 lb (73.029 kg)  BMI 27.62 kg/m2  SpO2 96%  Review of Systems She denies LOC.  She has intermittent chest pain, non-exertional.      Objective:   Physical Exam VITAL SIGNS:  See vs page GENERAL: no distress Pulses: dorsalis pedis intact bilat.   MSK: no deformity of the feet CV: no leg edema Skin:  no ulcer on the feet.  normal color and temp on the feet. Neuro: sensation is intact to touch on the feet, but decreased from normal   A1c=7.1%  i personally reviewed electrocardiogram tracing (today): Indication: chest pain Impression: normal    Assessment & Plan:  Type 1 DM: control is much better Hypothyroidism: due for recheck atyp chest pain, new.  Patient is advised the following: Patient Instructions  Please take these pump settings:  basal rate of 1.1 units/hr, except 1.8 units/hr, 3AM-6AM.   continue mealtime bolus of 1 unit/ 12 grams carbohydrate.  continue correction bolus (which some people call "sensitivity," or "insulin sensitivity ratio," or just "isr") of 1 unit for each 50 by which your glucose exceeds 100).   check your blood sugar 4 times a day: before the 3 meals, and at bedtime.  also check if you have symptoms of your blood sugar being too high or too low.  please keep a record of the readings and bring it to your next appointment here.  You can write it on any piece of paper.  please call us sooner if your blood sugar goes below 70, or if you have a lot of readings over 200.   A thyroid blood test is requested for you today.  We'll let you know about the results. Let's recheck the treadmill test.  you will receive a phone call, about a day and time for an appointment.   Please come back for a regular physical appointment in 3 months.      [name], MD

## 2016-02-16 ENCOUNTER — Other Ambulatory Visit: Payer: Self-pay | Admitting: Endocrinology

## 2016-02-16 LAB — TSH: TSH: 4.42 u[IU]/mL (ref 0.35–4.50)

## 2016-02-16 MED ORDER — LEVOTHYROXINE SODIUM 50 MCG PO TABS: 50.0000 ug | ORAL_TABLET | Freq: Every day | ORAL | Status: DC

## 2016-02-22 ENCOUNTER — Ambulatory Visit: Payer: 59 | Admitting: Endocrinology

## 2016-03-29 ENCOUNTER — Other Ambulatory Visit: Payer: Self-pay | Admitting: Endocrinology

## 2016-03-30 ENCOUNTER — Telehealth: Payer: Self-pay | Admitting: Endocrinology

## 2016-03-30 MED ORDER — INSULIN LISPRO 100 UNIT/ML ~~LOC~~ SOLN: SUBCUTANEOUS | 0 refills | Status: DC

## 2016-03-30 NOTE — Addendum Note (Signed)
Addended by: Verlin Grills T on: 03/30/2016 04:55 PM   Modules accepted: Orders

## 2016-03-30 NOTE — Telephone Encounter (Signed)
Rx submitted per pt's request. (Message reviewed at 454 pm )

## 2016-03-30 NOTE — Telephone Encounter (Signed)
PT needs Humulog refill sent to her pharmacy before lunch today.  She is going out of town.

## 2016-04-02 ENCOUNTER — Telehealth: Payer: Self-pay | Admitting: *Deleted

## 2016-04-02 NOTE — Telephone Encounter (Signed)
We have left several message for this patient  to call and schedule her gxt, (02/16/16 left message to call to schedule [email or web address] 02/21/16 [facility] NOT [facility]  03/01/16  LMOM TO CALL AND SCHEDULE/D.Karlita Lichtman  03/12/16 LMOM TO CALL AND SCHEDULE/D.Marsalis Beaulieu)

## 2016-05-01 ENCOUNTER — Telehealth: Payer: Self-pay | Admitting: Endocrinology

## 2016-05-01 DIAGNOSIS — R079 Chest pain, unspecified: Secondary | ICD-10-CM

## 2016-05-01 NOTE — Telephone Encounter (Signed)
Jasmine Schwartz, Could you please review and contact this patient about her stress test?

## 2016-05-01 NOTE — Telephone Encounter (Signed)
The order was placed on 02/15/16

## 2016-05-01 NOTE — Telephone Encounter (Signed)
Pt called and said that Dr. Loanne Drilling was going to put in an order for her to have a stress test, but she has not received call to schedule that.

## 2016-05-01 NOTE — Telephone Encounter (Signed)
See message, has this order been placed?

## 2016-05-07 ENCOUNTER — Emergency Department (HOSPITAL_COMMUNITY): Payer: 59

## 2016-05-07 ENCOUNTER — Encounter (HOSPITAL_COMMUNITY): Payer: Self-pay | Admitting: Emergency Medicine

## 2016-05-07 ENCOUNTER — Observation Stay (HOSPITAL_COMMUNITY)
Admission: EM | Admit: 2016-05-07 | Discharge: 2016-05-08 | Disposition: A | Discharge status: Home / Self Care | Payer: 59 | Attending: Internal Medicine | Admitting: Internal Medicine

## 2016-05-07 DIAGNOSIS — F329 Major depressive disorder, single episode, unspecified: Secondary | ICD-10-CM | POA: Insufficient documentation

## 2016-05-07 DIAGNOSIS — I200 Unstable angina: Secondary | ICD-10-CM

## 2016-05-07 DIAGNOSIS — E039 Hypothyroidism, unspecified: Secondary | ICD-10-CM | POA: Diagnosis not present

## 2016-05-07 DIAGNOSIS — Z9013 Acquired absence of bilateral breasts and nipples: Secondary | ICD-10-CM | POA: Diagnosis not present

## 2016-05-07 DIAGNOSIS — E119 Type 2 diabetes mellitus without complications: Secondary | ICD-10-CM | POA: Diagnosis not present

## 2016-05-07 DIAGNOSIS — F32A Depression, unspecified: Secondary | ICD-10-CM | POA: Diagnosis present

## 2016-05-07 DIAGNOSIS — R0789 Other chest pain: Principal | ICD-10-CM | POA: Insufficient documentation

## 2016-05-07 DIAGNOSIS — E7800 Pure hypercholesterolemia, unspecified: Secondary | ICD-10-CM | POA: Insufficient documentation

## 2016-05-07 DIAGNOSIS — E1042 Type 1 diabetes mellitus with diabetic polyneuropathy: Secondary | ICD-10-CM | POA: Diagnosis not present

## 2016-05-07 DIAGNOSIS — F419 Anxiety disorder, unspecified: Secondary | ICD-10-CM | POA: Diagnosis present

## 2016-05-07 DIAGNOSIS — E785 Hyperlipidemia, unspecified: Secondary | ICD-10-CM | POA: Diagnosis not present

## 2016-05-07 DIAGNOSIS — F418 Other specified anxiety disorders: Secondary | ICD-10-CM | POA: Diagnosis not present

## 2016-05-07 DIAGNOSIS — R079 Chest pain, unspecified: Secondary | ICD-10-CM | POA: Diagnosis not present

## 2016-05-07 DIAGNOSIS — Z9641 Presence of insulin pump (external) (internal): Secondary | ICD-10-CM | POA: Diagnosis not present

## 2016-05-07 DIAGNOSIS — Z794 Long term (current) use of insulin: Secondary | ICD-10-CM | POA: Diagnosis not present

## 2016-05-07 DIAGNOSIS — Z923 Personal history of irradiation: Secondary | ICD-10-CM | POA: Diagnosis not present

## 2016-05-07 DIAGNOSIS — Z853 Personal history of malignant neoplasm of breast: Secondary | ICD-10-CM | POA: Diagnosis not present

## 2016-05-07 DIAGNOSIS — Z9221 Personal history of antineoplastic chemotherapy: Secondary | ICD-10-CM | POA: Diagnosis not present

## 2016-05-07 DIAGNOSIS — E109 Type 1 diabetes mellitus without complications: Secondary | ICD-10-CM | POA: Diagnosis present

## 2016-05-07 LAB — CBC
HCT: 39.9 % (ref 36.0–46.0)
HEMOGLOBIN: 13.2 g/dL (ref 12.0–15.0)
MCH: 28.9 pg (ref 26.0–34.0)
MCHC: 33.1 g/dL (ref 30.0–36.0)
MCV: 87.5 fL (ref 78.0–100.0)
PLATELETS: 376 10*3/uL (ref 150–400)
RBC: 4.56 MIL/uL (ref 3.87–5.11)
RDW: 12.8 % (ref 11.5–15.5)
WBC: 11.3 10*3/uL — ABNORMAL HIGH (ref 4.0–10.5)

## 2016-05-07 LAB — I-STAT TROPONIN, ED: TROPONIN I, POC: 0 ng/mL (ref 0.00–0.08)

## 2016-05-07 LAB — CBG MONITORING, ED: GLUCOSE-CAPILLARY: 68 mg/dL (ref 65–99)

## 2016-05-07 LAB — BASIC METABOLIC PANEL
Anion gap: 10 (ref 5–15)
BUN: 15 mg/dL (ref 6–20)
CALCIUM: 9.1 mg/dL (ref 8.9–10.3)
CO2: 23 mmol/L (ref 22–32)
CREATININE: 0.85 mg/dL (ref 0.44–1.00)
Chloride: 102 mmol/L (ref 101–111)
GFR calc Af Amer: >60 mL/min (ref >60)
GFR calc non Af Amer: >60 mL/min (ref >60)
GLUCOSE: 82 mg/dL (ref 65–99)
Potassium: 3.5 mmol/L (ref 3.5–5.1)
Sodium: 135 mmol/L (ref 135–145)

## 2016-05-07 LAB — D-DIMER, QUANTITATIVE (NOT AT ARMC): D DIMER QUANT: 0.36 ug{FEU}/mL (ref 0.00–0.50)

## 2016-05-07 LAB — GLUCOSE, CAPILLARY: GLUCOSE-CAPILLARY: 191 mg/dL — AB (ref 65–99)

## 2016-05-07 LAB — MAGNESIUM: Magnesium: 1.8 mg/dL (ref 1.7–2.4)

## 2016-05-07 MED ORDER — ENOXAPARIN SODIUM 40 MG/0.4ML ~~LOC~~ SOLN: 40.0000 mg | Freq: Every day | SUBCUTANEOUS | Status: DC

## 2016-05-07 MED ORDER — ACETAMINOPHEN 325 MG PO TABS
650.0000 mg | ORAL_TABLET | ORAL | Status: DC | PRN
Start: 2016-05-07 — End: 2016-05-08

## 2016-05-07 MED ORDER — SODIUM CHLORIDE 0.9 % IV SOLN
INTRAVENOUS | Status: DC
Start: 2016-05-07 — End: 2016-05-08
  Administered 2016-05-08: via INTRAVENOUS

## 2016-05-07 MED ORDER — GI COCKTAIL ~~LOC~~: 30.0000 mL | Freq: Four times a day (QID) | ORAL | Status: DC | PRN

## 2016-05-07 MED ORDER — MORPHINE SULFATE (PF) 2 MG/ML IV SOLN: 2.0000 mg | INTRAVENOUS | Status: DC | PRN

## 2016-05-07 MED ORDER — ONDANSETRON HCL 4 MG/2ML IJ SOLN: 4.0000 mg | Freq: Four times a day (QID) | INTRAMUSCULAR | Status: DC | PRN

## 2016-05-07 MED ORDER — ASPIRIN 81 MG PO CHEW
324.0000 mg | CHEWABLE_TABLET | Freq: Once | ORAL | Status: AC
  Administered 2016-05-07: 324 mg via ORAL
  Filled 2016-05-07: qty 4

## 2016-05-07 MED ORDER — BUPROPION HCL ER (XL) 150 MG PO TB24
150.0000 mg | ORAL_TABLET | Freq: Every day | ORAL | Status: DC
  Administered 2016-05-08: 150 mg via ORAL
  Filled 2016-05-07: qty 1

## 2016-05-07 NOTE — ED Provider Notes (Signed)
[facility] DEPT Provider Note   CSN: [id number] Arrival date & time: 05/07/16  1839     History   Chief Complaint Chief Complaint  Patient presents with  . Chest Pain    HPI Myliyah Stasik is a 50 y.o. female.  The history is provided by the patient.  Chest Pain   This is a new problem. The current episode started more than 2 days ago (x several days). The problem occurs daily. The problem has been gradually worsening. The pain is associated with rest. The pain is present in the substernal region. The pain is mild. The quality of the pain is described as dull (tight, discomfort). The pain radiates to the upper back (between shoulder blades). Associated symptoms include back pain and shortness of breath. Pertinent negatives include no abdominal pain, no cough, no diaphoresis, no fever, no headaches, no leg pain, no lower extremity edema, no malaise/fatigue, no nausea, no vomiting and no weakness. She has tried rest for the symptoms. The treatment provided no relief.    Past Medical History:  Diagnosis Date  . Anxiety   . Anxiety and depression 07/06/2011  . Breast cancer ([place]) 01/01/13   /metastatic 1/1 lymph nodes  . Depression   . Diabetes mellitus    IDDM  . Dyslipidemia   . Hx of insertion of insulin pump ~ 2001 to present (06/09/2013)  . Lower extremity edema   . PONV (postoperative nausea and vomiting)   . Type I diabetes mellitus (HCC)    uses insulin pump  . Venous insufficiency     Patient Active Problem List   Diagnosis Date Noted  . Chest pain 02/15/2016  . Genetic testing 04/28/2015  . Hypothyroidism 04/06/2015  . Screening examination for infectious disease 04/06/2015  . Wellness examination 09/16/2014  . Lymphedema of upper extremity 07/13/2014  . Lower extremity edema 05/27/2013  . Breast cancer of upper-outer quadrant of right female breast ([place]) 01/01/2013  . Shortness of breath 03/31/2012  . Anxiety and depression 07/06/2011  . Encounter for  long-term (current) use of other medications 03/23/2011  . Goiter 03/23/2011  . URTICARIA 03/23/2010  . HYPERCHOLESTEROLEMIA 10/25/2008  . LEG PAIN, RIGHT 02/04/2008  . Type 1 diabetes mellitus ([place]) 03/20/2007  . ANXIETY 03/20/2007  . DEPRESSION 03/20/2007    Past Surgical History:  Procedure Laterality Date  . BREAST BIOPSY Right 01/01/13   Invasive mammary ca,metastatic in 1/1 lymph node  . BREAST BIOPSY Left 01/09/13   Fibroadenoma,microcalcifications  . CESAREAN SECTION  1989  . LUMBAR DISC SURGERY  2009   Dr Maxie Better  . MASTECTOMY COMPLETE / SIMPLE Left 06/09/2013  . MASTECTOMY MODIFIED RADICAL Right 06/09/2013  . MASTECTOMY MODIFIED RADICAL Right 06/09/2013   Procedure: RIGHT MASTECTOMY MODIFIED RADICAL;  Surgeon: Adin Hector, MD;  Location: [facility];  Service: General;  Laterality: Right;  . PORTACATH PLACEMENT Left 01/2013  . SIMPLE MASTECTOMY WITH AXILLARY SENTINEL NODE BIOPSY Left 06/09/2013   Procedure: LEFT SIMPLE MASTECTOMY;  Surgeon: Adin Hector, MD;  Location: [facility];  Service: General;  Laterality: Left;  . TUBAL LIGATION  1991    OB History    No data available       Home Medications    Prior to Admission medications   Medication Sig Start Date End Date Taking? Authorizing Provider  buPROPion (WELLBUTRIN XL) 150 MG 24 hr tablet Take 1 tablet (150 mg total) by mouth daily. 05/23/15  Yes [name], MD  Insulin Human (INSULIN PUMP) SOLN Inject 1  each into the skin continuous. humalog   Yes Historical Provider, MD  insulin lispro (HUMALOG) 100 UNIT/ML injection FOR USE IN PUMP TOTAL OF 60 UNITS ONCE A DAY Patient taking differently: Inject into the skin continuous. FOR USE IN PUMP 03/30/16  Yes [name], MD  ACCU-CHEK AVIVA PLUS test strip USE TO CHECK SUGAR 5 TIMES PER DAY 02/06/16   [name], MD  Blood Glucose Monitoring Suppl (ACCU-CHEK AVIVA) device Use to check blood sugar 5 times per day 06/29/15 06/28/16  [name], MD  Insulin Infusion  Pump Supplies (MINIMED INFUSION SET-MMT 397) MISC 1 Device by Does not apply route every 3 (three) days.    Historical Provider, MD  Insulin Infusion Pump Supplies (PARADIGM PUMP RESERVOIR 1.76ML) MISC 1 Device by Does not apply route every 3 (three) days.    Historical Provider, MD  Lancets (ACCU-CHEK MULTICLIX) lancets Use to check blood sugar 5 times per day 06/29/15   [name], MD  levothyroxine (SYNTHROID) 50 MCG tablet Take 1 tablet (50 mcg total) by mouth daily before breakfast. Patient not taking: Reported on 05/07/2016 02/16/16   [name], MD    Family History Family History  Problem Relation Age of Onset  . Cancer Neg Hx   . Diabetes Neg Hx   . Coronary artery disease Mother   . Congestive Heart Failure Maternal Grandmother   . Brain cancer Maternal Grandfather     dx in his early 48s    Social History Social History  Substance Use Topics  . Smoking status: Never Smoker  . Smokeless tobacco: Never Used  . Alcohol use No     Allergies   Review of patient's allergies indicates no known allergies.   Review of Systems Review of Systems  Constitutional: Negative for diaphoresis, fever and malaise/fatigue.  HENT: Negative for congestion.   Eyes: Negative for visual disturbance.  Respiratory: Positive for shortness of breath. Negative for cough.   Cardiovascular: Positive for chest pain.  Gastrointestinal: Negative for abdominal pain, nausea and vomiting.  Genitourinary: Negative for flank pain.  Musculoskeletal: Positive for back pain. Negative for neck pain and neck stiffness.  Skin: Negative for rash.  Neurological: Negative for weakness and headaches.  Psychiatric/Behavioral: Negative for confusion.     Physical Exam Updated Vital Signs BP 127/70   Pulse 85   Temp 98.3 F (36.8 C) (Oral)   Resp 10   SpO2 98%   Physical Exam  Constitutional: She is oriented to person, place, and time. She appears well-developed and well-nourished. No distress.    Pleasant, cooperative, mildly anxious but non-toxic appearing  HENT:  Head: Normocephalic and atraumatic.  Eyes: Conjunctivae are normal. No scleral icterus.  Neck: Normal range of motion. Neck supple. No JVD present.  Cardiovascular: Normal rate, regular rhythm and intact distal pulses.  Exam reveals no gallop and no friction rub.   No murmur heard. Pulmonary/Chest: Effort normal and breath sounds normal. No respiratory distress. She exhibits no tenderness.  Abdominal: Soft. She exhibits no distension. There is no tenderness.  Musculoskeletal: She exhibits no edema or tenderness.  Symmetric size and appearance of b/l Le's. No calf swelling or tenderness  Neurological: She is alert and oriented to person, place, and time. She exhibits normal muscle tone. Coordination normal.  Skin: Skin is warm and dry. Capillary refill takes less than 2 seconds. No rash noted. She is not diaphoretic.  Psychiatric: She has a normal mood and affect.  Nursing note and vitals reviewed.    ED  Treatments / Results  Labs (all labs ordered are listed, but only abnormal results are displayed) Labs Reviewed  CBC - Abnormal; Notable for the following:       Result Value   WBC 11.3 (*)    All other components within normal limits  BASIC METABOLIC PANEL  MAGNESIUM  D-DIMER, QUANTITATIVE (NOT AT [facility])  I-STAT TROPOININ, ED  CBG MONITORING, ED    EKG  EKG Interpretation  Date/Time:  Monday May 07 2016 18:45:25 EDT Ventricular Rate:  103 PR Interval:  134 QRS Duration: 92 QT Interval:  352 QTC Calculation: 461 R Axis:   77 Text Interpretation:  Sinus tachycardia Nonspecific T wave abnormality Abnormal ECG Confirmed by Lita Mains  MD, DAVID ([id number]) on 05/07/2016 6:52:40 PM       Radiology Dg Chest 2 View  Result Date: 05/07/2016 CLINICAL DATA:  Chest tightness for 3 days, shortness of breath, abnormal EKG. EXAM: CHEST  2 VIEW COMPARISON:  Chest x-ray dated 07/02/2008. FINDINGS: The heart size  and mediastinal contours are within normal limits. Both lungs are clear. The visualized skeletal structures are unremarkable. Surgical clips noted in the bilateral axillae. IMPRESSION: No active cardiopulmonary disease.  Lungs are clear. Electronically Signed   By: Franki Cabot M.D.   On: 05/07/2016 19:56    Procedures Procedures (including critical care time)  Medications Ordered in ED Medications  aspirin chewable tablet 324 mg (324 mg Oral Given 05/07/16 2034)     Initial Impression / Assessment and Plan / ED Course  I have reviewed the triage vital signs and the nursing notes.  Pertinent labs & imaging results that were available during my care of the patient were reviewed by me and considered in my medical decision making (see chart for details).  Clinical Course   Shekima Kiester is a 50 y.o. female with IDDM, h/o breast CA (s/p chemo and radiation therapy in 2015, none currently), who presents to ED for evaluation of several days of substernal chest tightness/discomfort radiating to upper back between shoulder blades. No relation to exertion. Negative D-dimer here, doubt PE by story. CP concerning for unstable angina. Doubt dissection. EKG with new changes of T-wave inversions in III and aVF, as well as some nonspecific abnormalities in V4-V5. Admitted for further management, as would benefit from serial troponins and inpatient stress test. Pt agrees with this plan.   Pt condition, course, and admission were discussed with attending physician Dr. Theotis Burrow.   Final Clinical Impressions(s) / ED Diagnoses   Final diagnoses:  Unstable angina [facility])    New Prescriptions New Prescriptions   No medications on file     Paralee Cancel, MD 05/07/16 [place], MD 05/12/16 [id number]

## 2016-05-07 NOTE — Telephone Encounter (Signed)
See message and please advise, Thanks!  

## 2016-05-07 NOTE — Telephone Encounter (Signed)
Jasmine Schwartz, I new order for the stress test. Could we please get this scheduled with the patient? Thanks!

## 2016-05-07 NOTE — ED Triage Notes (Signed)
Pt here for chest tightness x 3 days; pt sts SOB

## 2016-05-07 NOTE — ED Notes (Signed)
repaged [facility] to [facility] @[phone] 

## 2016-05-07 NOTE — Telephone Encounter (Signed)
done

## 2016-05-07 NOTE — H&P (Signed)
History and Physical    Jasmine Schwartz [id number] DOB: [birth year] DOA: 05/07/2016  Referring MD/NP/PA: Dr. Jimmye Norman, Resident PCP: [name], MD  Patient coming from: Home  Chief Complaint: Chest pressure  HPI: Jasmine Schwartz is a 50 y.o. female with medical history significant of breast cancer s/p b/l mastectomy with subsequent radiation chemotherapy, IDDM on insulin pump, dyslipidemia, anxiety, depression; who presents with complaints of chest pain. Symptoms reportedly initially started over the last month or so which she's had intermittent episodes of chest pressure. Symptoms are located centrally with radiation to her back. Describes symptoms as more discomfort with tightness. However, symptoms were more severe than normal today. Rates pain as a 7 out of 10 initially, and symptoms were more constant. Associated symptoms included some shortness of breath. Denies having any palpitations, nausea, vomiting, abdominal pain, dysuria, loss of consciousness, leg swelling, or recent travel. Patient had previously been evaluated by her primary care provider, who had set her up to have a stress test as a outpatient, but had not been completed yet.  Patient notes that she routinely follows with her oncologist and had previously required frequent echocardiograms due to the radiation/chemotherapy. Her last echocardiogram was normal in 12/2014. ED Course: Upon admission into the emergency department patient was evaluated and seen to be afebrile with heart rates up to 104, respirations up to 23, and all other vitals within normal limits. Lab work revealed WBC of 11.3, and all other including troponins within normal limits. EKG showed new T-wave inversions and subtle changes not seen on previous EKG tracings. Patient had been given 4 baby aspirin to chew and route and currently is pain-free. Admitted to [facility] for observation overnight.  Review of Systems: As per HPI otherwise 10 point review of systems negative.    Past Medical History:  Diagnosis Date  . Anxiety   . Anxiety and depression 07/06/2011  . Breast cancer ([place]) 01/01/13   /metastatic 1/1 lymph nodes  . Depression   . Diabetes mellitus    IDDM  . Dyslipidemia   . Hx of insertion of insulin pump ~ 2001 to present (06/09/2013)  . Lower extremity edema   . PONV (postoperative nausea and vomiting)   . Type I diabetes mellitus (HCC)    uses insulin pump  . Venous insufficiency     Past Surgical History:  Procedure Laterality Date  . BREAST BIOPSY Right 01/01/13   Invasive mammary ca,metastatic in 1/1 lymph node  . BREAST BIOPSY Left 01/09/13   Fibroadenoma,microcalcifications  . CESAREAN SECTION  1989  . LUMBAR DISC SURGERY  2009   Dr Maxie Better  . MASTECTOMY COMPLETE / SIMPLE Left 06/09/2013  . MASTECTOMY MODIFIED RADICAL Right 06/09/2013  . MASTECTOMY MODIFIED RADICAL Right 06/09/2013   Procedure: RIGHT MASTECTOMY MODIFIED RADICAL;  Surgeon: Adin Hector, MD;  Location: [facility];  Service: General;  Laterality: Right;  . PORTACATH PLACEMENT Left 01/2013  . SIMPLE MASTECTOMY WITH AXILLARY SENTINEL NODE BIOPSY Left 06/09/2013   Procedure: LEFT SIMPLE MASTECTOMY;  Surgeon: Adin Hector, MD;  Location: [place];  Service: General;  Laterality: Left;  . TUBAL LIGATION  1991     reports that she has never smoked. She has never used smokeless tobacco. She reports that she does not drink alcohol or use drugs.  No Known Allergies  Family History  Problem Relation Age of Onset  . Cancer Neg Hx   . Diabetes Neg Hx   . Coronary artery disease Mother   . Congestive Heart Failure Maternal  Grandmother   . Brain cancer Maternal Grandfather     dx in his early 10s    Prior to Admission medications   Medication Sig Start Date End Date Taking? Authorizing Provider  buPROPion (WELLBUTRIN XL) 150 MG 24 hr tablet Take 1 tablet (150 mg total) by mouth daily. 05/23/15  Yes [name], MD  Insulin Human (INSULIN PUMP) SOLN Inject 1 each into  the skin continuous. humalog   Yes Historical Provider, MD  insulin lispro (HUMALOG) 100 UNIT/ML injection FOR USE IN PUMP TOTAL OF 60 UNITS ONCE A DAY Patient taking differently: Inject into the skin continuous. FOR USE IN PUMP 03/30/16  Yes [name], MD  ACCU-CHEK AVIVA PLUS test strip USE TO CHECK SUGAR 5 TIMES PER DAY 02/06/16   [name], MD  Blood Glucose Monitoring Suppl (ACCU-CHEK AVIVA) device Use to check blood sugar 5 times per day 06/29/15 06/28/16  [name], MD  Insulin Infusion Pump Supplies (MINIMED INFUSION SET-MMT 397) MISC 1 Device by Does not apply route every 3 (three) days.    Historical Provider, MD  Insulin Infusion Pump Supplies (PARADIGM PUMP RESERVOIR 1.76ML) MISC 1 Device by Does not apply route every 3 (three) days.    Historical Provider, MD  Lancets (ACCU-CHEK MULTICLIX) lancets Use to check blood sugar 5 times per day 06/29/15   [name], MD  levothyroxine (SYNTHROID) 50 MCG tablet Take 1 tablet (50 mcg total) by mouth daily before breakfast. Patient not taking: Reported on 05/07/2016 02/16/16   [name], MD    Physical Exam:    Constitutional: NAD, calm, comfortable Vitals:   05/07/16 2130 05/07/16 2200 05/07/16 2230 05/07/16 2317  BP: 125/72 124/72 127/70 125/75  Pulse: 92 96 85 93  Resp: 14 13 10 18   Temp:    98.3 F (36.8 C)  TempSrc:    Oral  SpO2: 99% 100% 98% 100%  Weight:    71 kg (156 lb 8 oz)  Height:    5\' 3"  (1.6 m)   Eyes: PERRL, lids and conjunctivae normal ENMT: Mucous membranes are moist. Posterior pharynx clear of any exudate or lesions.Normal dentition.  Neck: normal, supple, no masses, no thyromegaly Respiratory: clear to auscultation bilaterally, no wheezing, no crackles. Normal respiratory effort. No accessory muscle use.  Cardiovascular: Regular rate and rhythm, no murmurs / rubs / gallops. No extremity edema. 2+ pedal pulses. No carotid bruits.  Abdomen: no tenderness, no masses palpated. No hepatosplenomegaly.  Bowel sounds positive.  Musculoskeletal: no clubbing / cyanosis. No joint deformity upper and lower extremities. Good ROM, no contractures. Normal muscle tone.  Skin: no rashes, lesions, ulcers. No induration Neurologic: CN 2-12 grossly intact. Sensation intact, DTR normal. Strength 5/5 in all 4.  Psychiatric: Normal judgment and insight. Alert and oriented x 3. Normal mood.     Labs on Admission: I have personally reviewed following labs and imaging studies  CBC:  Recent Labs Lab 05/07/16 1851  WBC 11.3*  HGB 13.2  HCT 39.9  MCV 87.5  PLT [id number]   Basic Metabolic Panel:  Recent Labs Lab 05/07/16 1851 05/07/16 1919  NA 135  --   K 3.5  --   CL 102  --   CO2 23  --   GLUCOSE 82  --   BUN 15  --   CREATININE 0.85  --   CALCIUM 9.1  --   MG  --  1.8   GFR: Estimated Creatinine Clearance: 73.9 mL/min (by C-G formula based on SCr of  0.85 mg/dL). Liver Function Tests: No results for input(s): AST, ALT, ALKPHOS, BILITOT, PROT, ALBUMIN in the last 168 hours. No results for input(s): LIPASE, AMYLASE in the last 168 hours. No results for input(s): AMMONIA in the last 168 hours. Coagulation Profile: No results for input(s): INR, PROTIME in the last 168 hours. Cardiac Enzymes: No results for input(s): CKTOTAL, CKMB, CKMBINDEX, TROPONINI in the last 168 hours. BNP (last 3 results) No results for input(s): PROBNP in the last 8760 hours. HbA1C: No results for input(s): HGBA1C in the last 72 hours. CBG:  Recent Labs Lab 05/07/16 1907 05/07/16 2326  GLUCAP 68 191*   Lipid Profile: No results for input(s): CHOL, HDL, LDLCALC, TRIG, CHOLHDL, LDLDIRECT in the last 72 hours. Thyroid Function Tests: No results for input(s): TSH, T4TOTAL, FREET4, T3FREE, THYROIDAB in the last 72 hours. Anemia Panel: No results for input(s): VITAMINB12, FOLATE, FERRITIN, TIBC, IRON, RETICCTPCT in the last 72 hours. Urine analysis:    Component Value Date/Time   COLORURINE YELLOW 10/28/2015  1127   APPEARANCEUR CLEAR 10/28/2015 1127   LABSPEC 1.025 10/28/2015 1127   PHURINE 6.0 10/28/2015 1127   GLUCOSEU NEGATIVE 10/28/2015 1127   HGBUR NEGATIVE 10/28/2015 1127   BILIRUBINUR NEGATIVE 10/28/2015 1127   KETONESUR TRACE (A) 10/28/2015 1127   PROTEINUR NEGATIVE 05/29/2013 0848   UROBILINOGEN 0.2 10/28/2015 1127   NITRITE NEGATIVE 10/28/2015 1127   LEUKOCYTESUR NEGATIVE 10/28/2015 1127   Sepsis Labs: No results found for this or any previous visit (from the past 240 hour(s)).   Radiological Exams on Admission: Dg Chest 2 View  Result Date: 05/07/2016 CLINICAL DATA:  Chest tightness for 3 days, shortness of breath, abnormal EKG. EXAM: CHEST  2 VIEW COMPARISON:  Chest x-ray dated 07/02/2008. FINDINGS: The heart size and mediastinal contours are within normal limits. Both lungs are clear. The visualized skeletal structures are unremarkable. Surgical clips noted in the bilateral axillae. IMPRESSION: No active cardiopulmonary disease.  Lungs are clear. Electronically Signed   By: Franki Cabot M.D.   On: 05/07/2016 19:56    EKG: Independently reviewed. Sinus tachycardia with nonspecific T-wave abnormalities.  Assessment/Plan Chest pain: Heart score = 5 - Admit to telemetry bed - Chest pain orderset initiated - Trend cardiac enzymes and check lipid panel - Recheck EKG in a.m. - Echocardiogram in a.m. - Will need to consult cardiology in a.m. to determine if stress test can be performed   Type 1 diabetes mellitus on insulin pump - Patient requested to continue insulin pump - Hypoglycemic protocol initiated - Check CBGs every 4 hours  History of hypothyroidism - Check free T4/TSH and a.m. -Will need to question patient why they are not taking levothyroxine, if abnormal  Anxiety/depression - continue Wellbutrin  History of breast cancer status post bilateral mastectomy with chemotherapy and radiation  DVT prophylaxis: Lovenox Code Status: Full Family Communication:  Discussed overall care with the patient and her husband present at bedside Disposition Plan: [place] discharge home once medically stable. Consults called: none Admission status: *Telemetry observation  Norval Morton MD [facility] Pager [phone]  If 7PM-7AM, please contact night-coverage [email or web address] Password [facility]  05/07/2016, 11:28 PM

## 2016-05-07 NOTE — Progress Notes (Signed)
Pt arrived to floor accompanied by NA and husband. She is alert and oriented, no complaints of pain. Vitals stable. Resting comfortably in bed with call bell within reach. Admitting MD paged. Will continue to monitor closely.

## 2016-05-07 NOTE — Telephone Encounter (Signed)
Patient stated that the stress test has expired it need to be put back in, so patient can see the doctor.

## 2016-05-08 ENCOUNTER — Encounter (HOSPITAL_COMMUNITY): Payer: Self-pay | Admitting: *Deleted

## 2016-05-08 ENCOUNTER — Observation Stay (HOSPITAL_BASED_OUTPATIENT_CLINIC_OR_DEPARTMENT_OTHER): Payer: 59

## 2016-05-08 ENCOUNTER — Observation Stay (HOSPITAL_COMMUNITY): Payer: 59

## 2016-05-08 DIAGNOSIS — E119 Type 2 diabetes mellitus without complications: Secondary | ICD-10-CM | POA: Diagnosis not present

## 2016-05-08 DIAGNOSIS — E038 Other specified hypothyroidism: Secondary | ICD-10-CM | POA: Diagnosis not present

## 2016-05-08 DIAGNOSIS — R079 Chest pain, unspecified: Secondary | ICD-10-CM | POA: Diagnosis not present

## 2016-05-08 DIAGNOSIS — Z9641 Presence of insulin pump (external) (internal): Secondary | ICD-10-CM | POA: Diagnosis not present

## 2016-05-08 DIAGNOSIS — R072 Precordial pain: Secondary | ICD-10-CM | POA: Diagnosis not present

## 2016-05-08 DIAGNOSIS — F418 Other specified anxiety disorders: Secondary | ICD-10-CM | POA: Diagnosis not present

## 2016-05-08 DIAGNOSIS — Z9013 Acquired absence of bilateral breasts and nipples: Secondary | ICD-10-CM | POA: Diagnosis not present

## 2016-05-08 DIAGNOSIS — R0789 Other chest pain: Secondary | ICD-10-CM | POA: Diagnosis not present

## 2016-05-08 DIAGNOSIS — Z794 Long term (current) use of insulin: Secondary | ICD-10-CM | POA: Diagnosis not present

## 2016-05-08 LAB — CBC WITH DIFFERENTIAL/PLATELET
BASOS ABS: 0 10*3/uL (ref 0.0–0.1)
BASOS PCT: 0 %
EOS ABS: 0.2 10*3/uL (ref 0.0–0.7)
EOS PCT: 2 %
HCT: 38.7 % (ref 36.0–46.0)
Hemoglobin: 13.3 g/dL (ref 12.0–15.0)
LYMPHS PCT: 18 %
Lymphs Abs: 1.6 10*3/uL (ref 0.7–4.0)
MCH: 29.8 pg (ref 26.0–34.0)
MCHC: 34.4 g/dL (ref 30.0–36.0)
MCV: 86.8 fL (ref 78.0–100.0)
MONO ABS: 0.8 10*3/uL (ref 0.1–1.0)
Monocytes Relative: 9 %
Neutro Abs: 6.3 10*3/uL (ref 1.7–7.7)
Neutrophils Relative %: 71 %
PLATELETS: 311 10*3/uL (ref 150–400)
RBC: 4.46 MIL/uL (ref 3.87–5.11)
RDW: 12.8 % (ref 11.5–15.5)
WBC: 8.9 10*3/uL (ref 4.0–10.5)

## 2016-05-08 LAB — NM MYOCAR MULTI W/SPECT W/WALL MOTION / EF
CHL CUP RESTING HR STRESS: 88 {beats}/min
CSEPEW: 1 METS
CSEPPHR: 130 {beats}/min
Exercise duration (min): 5 min
Exercise duration (sec): 1 s

## 2016-05-08 LAB — GLUCOSE, CAPILLARY
GLUCOSE-CAPILLARY: 174 mg/dL — AB (ref 65–99)
GLUCOSE-CAPILLARY: 62 mg/dL — AB (ref 65–99)
GLUCOSE-CAPILLARY: 135 mg/dL — AB (ref 65–99)
GLUCOSE-CAPILLARY: 120 mg/dL — AB (ref 65–99)
Glucose-Capillary: 207 mg/dL — ABNORMAL HIGH (ref 65–99)

## 2016-05-08 LAB — BASIC METABOLIC PANEL
ANION GAP: 9 (ref 5–15)
BUN: 15 mg/dL (ref 6–20)
CALCIUM: 8.9 mg/dL (ref 8.9–10.3)
CO2: 24 mmol/L (ref 22–32)
Chloride: 104 mmol/L (ref 101–111)
Creatinine, Ser: 0.75 mg/dL (ref 0.44–1.00)
Glucose, Bld: 139 mg/dL — ABNORMAL HIGH (ref 65–99)
POTASSIUM: 3.7 mmol/L (ref 3.5–5.1)
SODIUM: 137 mmol/L (ref 135–145)

## 2016-05-08 LAB — LIPID PANEL
CHOL/HDL RATIO: 2.3 ratio
Cholesterol: 187 mg/dL (ref 0–200)
HDL: 82 mg/dL (ref >40)
LDL CALC: 94 mg/dL (ref 0–99)
Triglycerides: 57 mg/dL (ref <150)
VLDL: 11 mg/dL (ref 0–40)

## 2016-05-08 LAB — T4, FREE: FREE T4: 0.77 ng/dL (ref 0.61–1.12)

## 2016-05-08 LAB — TROPONIN I
Troponin I: <0.03 ng/mL (ref <0.03)
Troponin I: <0.03 ng/mL (ref <0.03)

## 2016-05-08 LAB — TSH: TSH: 24.8 u[IU]/mL — ABNORMAL HIGH (ref 0.350–4.500)

## 2016-05-08 MED ORDER — REGADENOSON 0.4 MG/5ML IV SOLN
0.4000 mg | Freq: Once | INTRAVENOUS | Status: AC
  Administered 2016-05-08: 0.4 mg via INTRAVENOUS
  Filled 2016-05-08: qty 5

## 2016-05-08 MED ORDER — TECHNETIUM TC 99M TETROFOSMIN IV KIT
30.0000 | PACK | Freq: Once | INTRAVENOUS | Status: AC | PRN
  Administered 2016-05-08: 30 via INTRAVENOUS

## 2016-05-08 MED ORDER — LEVOTHYROXINE SODIUM 50 MCG PO TABS: 50.0000 ug | ORAL_TABLET | Freq: Every day | ORAL | Status: DC

## 2016-05-08 MED ORDER — REGADENOSON 0.4 MG/5ML IV SOLN
INTRAVENOUS | Status: AC
  Filled 2016-05-08: qty 5

## 2016-05-08 MED ORDER — ASPIRIN EC 81 MG PO TBEC: 81.0000 mg | DELAYED_RELEASE_TABLET | Freq: Every day | ORAL | Status: DC

## 2016-05-08 MED ORDER — TECHNETIUM TC 99M TETROFOSMIN IV KIT
10.0000 | PACK | Freq: Once | INTRAVENOUS | Status: AC | PRN
  Administered 2016-05-08: 10 via INTRAVENOUS

## 2016-05-08 MED ORDER — ASPIRIN 81 MG PO TBEC: 81.0000 mg | DELAYED_RELEASE_TABLET | Freq: Every day | ORAL | 0 refills | Status: DC

## 2016-05-08 MED ORDER — INSULIN PUMP
Freq: Three times a day (TID) | SUBCUTANEOUS | Status: DC
  Filled 2016-05-08: qty 1

## 2016-05-08 NOTE — Progress Notes (Signed)
Patient presented for Lexiscan. Tolerated procedure well. Pending final stress imaging result.  

## 2016-05-08 NOTE — Discharge Summary (Signed)
Discharge Summary  Jasmine Schwartz [id number] DOB: Mar 22, 1966  PCP: [name], MD  Admit date: 05/07/2016 Discharge date: 05/08/2016  Time spent: <51mins  Recommendations for Outpatient Follow-up:  1. F/u with PMD within a week  for hospital discharge follow up, repeat cbc/bmp at follow up, pmd to repeat tsh in 4-6 weeks 2. F/u with cardiology  Discharge Diagnoses:  Active Hospital Problems   Diagnosis Date Noted  . Chest pain 02/15/2016  . Hypothyroidism 04/06/2015  . Anxiety and depression 07/06/2011  . Type 1 diabetes mellitus ([place]) 03/20/2007    Resolved Hospital Problems   Diagnosis Date Noted Date Resolved  No resolved problems to display.    Discharge Condition: stable  Diet recommendation: heart healthy  Filed Weights   05/07/16 2317  Weight: 71 kg (156 lb 8 oz)    History of present illness:  Chief Complaint: Chest pressure  HPI: Jasmine Schwartz is a 50 y.o. female with medical history significant of breast cancer s/p b/l mastectomy with subsequent radiation chemotherapy, IDDM on insulin pump, dyslipidemia, anxiety, depression; who presents with complaints of chest pain. Symptoms reportedly initially started over the last month or so which she's had intermittent episodes of chest pressure. Symptoms are located centrally with radiation to her back. Describes symptoms as more discomfort with tightness. However, symptoms were more severe than normal today. Rates pain as a 7 out of 10 initially, and symptoms were more constant. Associated symptoms included some shortness of breath. Denies having any palpitations, nausea, vomiting, abdominal pain, dysuria, loss of consciousness, leg swelling, or recent travel. Patient had previously been evaluated by her primary care provider, who had set her up to have a stress test as a outpatient, but had not been completed yet.  Patient notes that she routinely follows with her oncologist and had previously required frequent  echocardiograms due to the radiation/chemotherapy. Her last echocardiogram was normal in 12/2014. ED Course: Upon admission into the emergency department patient was evaluated and seen to be afebrile with heart rates up to 104, respirations up to 23, and all other vitals within normal limits. Lab work revealed WBC of 11.3, and all other including troponins within normal limits. EKG showed new T-wave inversions and subtle changes not seen on previous EKG tracings. Patient had been given 4 baby aspirin to chew and route and currently is pain-free. Admitted to [facility] for observation overnight.  Hospital Course:  Principal Problem:   Chest pain Active Problems:   Type 1 diabetes mellitus (HCC)   Anxiety and depression   Hypothyroidism   Chest pain: resolved. ddimer 0.36, cxr unremarkable, Troponin negative x3, ldl 94, patient report recent a1c was 7,  Her initial ekg with nonspecific t wave changes, repeat ekg next morning with resolved t wave changes. She does has risk factor including type I diabetes and family history Cardiology consulted, stress test on 9/26 low risk , she is cleared by cardiology to discharge home with outpatient cardiology follow up.  Type 1 diabetes mellitus on insulin pump - Patient requested to continue insulin pump    Hypothyroidism: tsh 24, patient report was prescribed synthroid, but has not been taking it, restart synthroid, repeat tsh in 4-6 weeks   H/o breast cancer : finished treatment with bilateral mastectomy with chemotherapy and radiation , in remission  Anxiety/depression -continue Wellbutrin  Code Status: full  Family Communication: patient and family members in room  Disposition Plan: home on 9/26   Consultants:  cardiology  Procedures:  Cardiac Stress test on 9/26, low  risk  Antibiotics:  none   Discharge Exam: BP (!) 143/71   Pulse (!) 106 Comment: test complete  Temp 98.5 F (36.9 C) (Oral)   Resp 19   Ht 5\' 3"   (1.6 m)   Wt 71 kg (156 lb 8 oz)   SpO2 100%   BMI 27.72 kg/m     General:  NAD  Cardiovascular: RRR  Respiratory: CTABL  Abdomen: Soft/ND/NT, positive BS  Musculoskeletal: No Edema  Neuro: aaox3   Discharge Instructions You were cared for by a hospitalist during your hospital stay. If you have any questions about your discharge medications or the care you received while you were in the hospital after you are discharged, you can call the unit and asked to speak with the hospitalist on call if the hospitalist that took care of you is not available. Once you are discharged, your primary care physician will handle any further medical issues. Please note that NO REFILLS for any discharge medications will be authorized once you are discharged, as it is imperative that you return to your primary care physician (or establish a relationship with a primary care physician if you do not have one) for your aftercare needs so that they can reassess your need for medications and monitor your lab values.  Discharge Instructions    Diet - low sodium heart healthy    Complete by:  As directed    Low fat   Increase activity slowly    Complete by:  As directed        Medication List    TAKE these medications   ACCU-CHEK AVIVA device Use to check blood sugar 5 times per day   ACCU-CHEK AVIVA PLUS test strip Generic drug:  glucose blood USE TO CHECK SUGAR 5 TIMES PER DAY   accu-chek multiclix lancets Use to check blood sugar 5 times per day   aspirin 81 MG EC tablet Take 1 tablet (81 mg total) by mouth daily. Start taking on:  05/09/2016   buPROPion 150 MG 24 hr tablet Commonly known as:  WELLBUTRIN XL Take 1 tablet (150 mg total) by mouth daily.   insulin lispro 100 UNIT/ML injection Commonly known as:  HUMALOG FOR USE IN PUMP TOTAL OF 60 UNITS ONCE A DAY What changed:  how to take this  when to take this  additional instructions   insulin pump Soln Inject 1 each into the  skin continuous. humalog   levothyroxine 50 MCG tablet Commonly known as:  SYNTHROID Take 1 tablet (50 mcg total) by mouth daily before breakfast.   MINIMED INFUSION SET-MMT 397 Misc 1 Device by Does not apply route every 3 (three) days.   PARADIGM PUMP RESERVOIR 1.76ML Misc 1 Device by Does not apply route every 3 (three) days.      No Known Allergies Follow-up Information    [name], MD Follow up in 1 week(s).   Specialty:  Endocrinology Why:  hospital discharge follow up, repeat tsh in 4-6 weeks Contact information: [address] [place] [address] [phone]        Glori Bickers, MD Follow up in 2 week(s).   Specialty:  Cardiology Contact information: [address] [place] [address] [phone]            The results of significant diagnostics from this hospitalization (including imaging, microbiology, ancillary and laboratory) are listed below for reference.    Significant Diagnostic Studies: Dg Chest 2 View  Result Date:  05/07/2016 CLINICAL DATA:  Chest tightness for 3 days, shortness of breath, abnormal EKG. EXAM: CHEST  2 VIEW COMPARISON:  Chest x-ray dated 07/02/2008. FINDINGS: The heart size and mediastinal contours are within normal limits. Both lungs are clear. The visualized skeletal structures are unremarkable. Surgical clips noted in the bilateral axillae. IMPRESSION: No active cardiopulmonary disease.  Lungs are clear. Electronically Signed   By: Franki Cabot M.D.   On: 05/07/2016 19:56   Nm Myocar Multi W/spect W/wall Motion / Ef  Result Date: 05/08/2016 CLINICAL DATA:  50 year old female with intermittent chest pain. Diabetes. EXAM: MYOCARDIAL IMAGING WITH SPECT (REST AND PHARMACOLOGIC-STRESS) GATED LEFT VENTRICULAR WALL MOTION STUDY LEFT VENTRICULAR EJECTION FRACTION TECHNIQUE: Standard myocardial SPECT imaging was performed after resting intravenous injection of 10 mCi Tc-78m tetrofosmin.  Subsequently, intravenous infusion of Lexiscan was performed under the supervision of the Cardiology staff. At peak effect of the drug, 30 mCi Tc-47m tetrofosmin was injected intravenously and standard myocardial SPECT imaging was performed. Quantitative gated imaging was also performed to evaluate left ventricular wall motion, and estimate left ventricular ejection fraction. COMPARISON:  None. FINDINGS: Perfusion: No decreased activity in the left ventricle on stress imaging to suggest reversible ischemia or infarction. Wall Motion: Normal left ventricular wall motion. No left ventricular dilation. Left Ventricular Ejection Fraction:  > 70 % End diastolic volume 45 ml End systolic volume 13 ml IMPRESSION: 1. No reversible ischemia or infarction. 2. Normal left ventricular wall motion. 3. Left ventricular ejection fraction > 70% 4. Non invasive risk stratification*: Low *2012 Appropriate Use Criteria for Coronary Revascularization Focused Update: J Am Coll Cardiol. [id number]. [email or web address]://[email or web address] Electronically Signed   By: Suzy Bouchard M.D.   On: 05/08/2016 17:23    Microbiology: No results found for this or any previous visit (from the past 240 hour(s)).   Labs: Basic Metabolic Panel:  Recent Labs Lab 05/07/16 1851 05/07/16 1919 05/08/16 0919  NA 135  --  137  K 3.5  --  3.7  CL 102  --  104  CO2 23  --  24  GLUCOSE 82  --  139*  BUN 15  --  15  CREATININE 0.85  --  0.75  CALCIUM 9.1  --  8.9  MG  --  1.8  --    Liver Function Tests: No results for input(s): AST, ALT, ALKPHOS, BILITOT, PROT, ALBUMIN in the last 168 hours. No results for input(s): LIPASE, AMYLASE in the last 168 hours. No results for input(s): AMMONIA in the last 168 hours. CBC:  Recent Labs Lab 05/07/16 1851 05/08/16 0919  WBC 11.3* 8.9  NEUTROABS  --  6.3  HGB 13.2 13.3  HCT 39.9 38.7  MCV 87.5 86.8  PLT 376 311   Cardiac Enzymes:  Recent Labs Lab  05/08/16 0014 05/08/16 0332 05/08/16 0919  TROPONINI <0.03 <0.03 <0.03   BNP: BNP (last 3 results) No results for input(s): BNP in the last 8760 hours.  ProBNP (last 3 results) No results for input(s): PROBNP in the last 8760 hours.  CBG:  Recent Labs Lab 05/08/16 0427 05/08/16 0726 05/08/16 0836 05/08/16 1239 05/08/16 1619  GLUCAP 174* 62* 135* 120* 207*       SignedFlorencia Reasons MD, PhD  [facility] 05/08/2016, 6:59 PM

## 2016-05-08 NOTE — Consult Note (Signed)
.   Cardiology Consult    Patient ID: Jasmine Schwartz MRN: [id number], DOB/AGE: [birth year]   Admit date: 05/07/2016 Date of Consult: 05/08/2016  Primary Physician: [name], MD Primary Cardiologist: Dr. Haroldine Laws Requesting Provider: Dr. Erlinda Hong Reason for Consultation: Chest pain  Patient Profile    50 yo female with PMH of DM I, s/p breast CA, dyslipidemia, and depression who presented to the [facility] ED with reports of intermittent chest pain over the past couple of months.   Past Medical History   Past Medical History:  Diagnosis Date  . Anxiety   . Anxiety and depression 07/06/2011  . Breast cancer ([place]) 01/01/13   /metastatic 1/1 lymph nodes  . Depression   . Diabetes mellitus    IDDM  . Dyslipidemia   . Hx of insertion of insulin pump ~ 2001 to present (06/09/2013)  . Lower extremity edema   . PONV (postoperative nausea and vomiting)   . Type I diabetes mellitus (HCC)    uses insulin pump  . Venous insufficiency     Past Surgical History:  Procedure Laterality Date  . BREAST BIOPSY Right 01/01/13   Invasive mammary ca,metastatic in 1/1 lymph node  . BREAST BIOPSY Left 01/09/13   Fibroadenoma,microcalcifications  . CESAREAN SECTION  1989  . LUMBAR DISC SURGERY  2009   Dr Maxie Better  . MASTECTOMY COMPLETE / SIMPLE Left 06/09/2013  . MASTECTOMY MODIFIED RADICAL Right 06/09/2013  . MASTECTOMY MODIFIED RADICAL Right 06/09/2013   Procedure: RIGHT MASTECTOMY MODIFIED RADICAL;  Surgeon: Adin Hector, MD;  Location: [facility];  Service: General;  Laterality: Right;  . PORTACATH PLACEMENT Left 01/2013  . SIMPLE MASTECTOMY WITH AXILLARY SENTINEL NODE BIOPSY Left 06/09/2013   Procedure: LEFT SIMPLE MASTECTOMY;  Surgeon: Adin Hector, MD;  Location: [facility];  Service: General;  Laterality: Left;  . TUBAL LIGATION  1991     Allergies  No Known Allergies  History of Present Illness    Jasmine Schwartz is a 50 yo female with PMH of DM I, s/p breast CA, dyslipidemia, and depression.  She was seen by Dr. Haroldine Laws in the past in the cardio/oncology clinic to follow her EF with her breast CA treatments. Last echo showed EF of 55% with normal RV size and no valvular abnormalities. Reports she has been IDDM for the past 28 years. States her biological father had multiple MIs later in life, but she is not aware of aspecific age.   States she is now completed with her cancer treatment and only going for routine follow up. Reports over the past couple of months she has had intermittent chest pain mostly associated with activity that this relieved by rest. States that these episodes seem to last for hours at a time and eventually subside on their own. Reports the episodes seem to be more often recently with the last episode starting on Saturday. States the pain was located in the center of her chest and describes as a discomfort with radiation into her upper back. Her symptoms did eventually subside, but never fully resolved. Denies any associated nausea, vomiting, palpitations or dizziness. Does report feeling slightly dyspneic when the pain is at it's worse.   She presented to the ED on 9/25, her labs showed stable electrolytes. Trop cycled and neg x3. CBC stable. Chest x-ray was negative. Of note her TSH was 24! EKG in the ED showed new nonspecific T wave changes, but repeat today shows SR with resolved T wave changes.   Inpatient Medications    .  buPROPion  150 mg Oral Daily  . enoxaparin (LOVENOX) injection  40 mg Subcutaneous QHS    Family History    Family History  Problem Relation Age of Onset  . Cancer Neg Hx   . Diabetes Neg Hx   . Coronary artery disease Mother   . Congestive Heart Failure Maternal Grandmother   . Brain cancer Maternal Grandfather     dx in his early 38s    Social History    Social History   Social History  . Marital status: Married    Spouse name: N/A  . Number of children: 2  . Years of education: N/A   Occupational History  .  [employer]    Social History Main Topics  . Smoking status: Never Smoker  . Smokeless tobacco: Never Used  . Alcohol use No  . Drug use: No  . Sexual activity: Yes   Other Topics Concern  . Not on file   Social History Narrative  . No narrative on file     Review of Systems    General:  No chills, fever, night sweats or weight changes.  Cardiovascular: See HPI Dermatological: No rash, lesions/masses Respiratory: No cough, dyspnea Urologic: No hematuria, dysuria Abdominal:   No nausea, vomiting, diarrhea, bright red blood per rectum, melena, or hematemesis Neurologic:  No visual changes, wkns, changes in mental status. All other systems reviewed and are otherwise negative except as noted above.  Physical Exam    Blood pressure 119/66, pulse 81, temperature 98.1 F (36.7 C), temperature source Oral, resp. rate 18, height 5\' 3"  (1.6 m), weight 156 lb 8 oz (71 kg), SpO2 98 %.  General: Pleasant caucasian female, NAD Psych: Normal affect. Neuro: Alert and oriented X 3. Moves all extremities spontaneously. HEENT: Normal  Neck: Supple without bruits or JVD. Lungs:  Resp regular and unlabored, CTA. Heart: RRR no s3, s4, or murmurs. Abdomen: Soft, non-tender, non-distended, BS + x 4.  Extremities: No clubbing, cyanosis or edema. DP/PT/Radials 2+ and equal bilaterally.  Labs    Troponin [facility] of Care Test)  Recent Labs  05/07/16 1926  TROPIPOC 0.00    Recent Labs  05/08/16 0014 05/08/16 0332  TROPONINI <0.03 <0.03   Lab Results  Component Value Date   WBC 11.3 (H) 05/07/2016   HGB 13.2 05/07/2016   HCT 39.9 05/07/2016   MCV 87.5 05/07/2016   PLT 376 05/07/2016    Recent Labs Lab 05/07/16 1851  NA 135  K 3.5  CL 102  CO2 23  BUN 15  CREATININE 0.85  CALCIUM 9.1  GLUCOSE 82   Lab Results  Component Value Date   CHOL 192 10/28/2015   HDL 78.90 10/28/2015   LDLCALC 101 (H) 10/28/2015   TRIG 56.0 10/28/2015   Lab Results  Component Value Date    DDIMER 0.36 05/07/2016     Radiology Studies    Dg Chest 2 View  Result Date: 05/07/2016 CLINICAL DATA:  Chest tightness for 3 days, shortness of breath, abnormal EKG. EXAM: CHEST  2 VIEW COMPARISON:  Chest x-ray dated 07/02/2008. FINDINGS: The heart size and mediastinal contours are within normal limits. Both lungs are clear. The visualized skeletal structures are unremarkable. Surgical clips noted in the bilateral axillae. IMPRESSION: No active cardiopulmonary disease.  Lungs are clear. Electronically Signed   By: Franki Cabot M.D.   On: 05/07/2016 19:56    ECG & Cardiac Imaging    EKG: SR, nonspecific T wave changes on EKG  in the ED, but resolved this morning.   Echo: 5/15  Study Conclusions  - Left ventricle: The cavity size was normal. Wall thickness was normal. The estimated ejection fraction was 55%. Wall motion was normal; there were no regional wall motion abnormalities. Doppler parameters are consistent with abnormal left ventricular relaxation (grade 1 diastolic dysfunction). Lateral S' 10.6 cm/sec. Global longitudinal strain -20.7%. - Aortic valve: There was no stenosis. - Mitral valve: No significant regurgitation. - Right ventricle: The cavity size was normal. Systolic function was normal. - Pulmonary arteries: No complete TR doppler jet so unable to estimate PA systolic pressure. - Inferior vena cava: The vessel was normal in size; the respirophasic diameter changes were in the normal range (= 50%); findings are consistent with normal central venous pressure. Impressions:  - Normal LV size with EF 55%. Strain and lateral S' as above. Normal RV size and systolic function. No significant valvular abnormalities.  Assessment & Plan    50 yo female with PMH of DM I, s/p breast CA, dyslipidemia, and depression who presented to the [facility] ED with reports of intermittent chest pain over the past couple of months.   1. Chest pain:  Reports multiple episodes of chest pain over the past couple of months. States mostly associated with activity but last for extended periods time. Does report that episodes seem to come more often with last episode starting on Saturday that continued until she presented to the ED yesterday. Reports symptoms were finally relieved with ASA in the ED. Trop neg x3. EKG initially showed non specific T wave changes, but have resolved on morning EKG. Does have risk factors including IDDM for the past 28 years, and family hx with her father.  -- Last Lipid panel ok, TSH this admission was 24. Repeat TSH and T4 pending. Would benefit from further cardiac testing. Will discuss with MD regarding stress test today a patient has been NPO.  -- 2D echo pending.   Barnet Pall, NP-C Pager [phone] 05/08/2016, 9:56 AM   History and all data above reviewed.  Patient examined.  I agree with the findings as above.  The patient describes chest pain that was 7/10.  Somewhat constant.  Not like previous pain.  No objective evidence of ischemia.  EKG with non specific changes.   The patient exam reveals COR:RRR  ,  Lungs: Clear  ,  Abd: Positive bowel sounds, no rebound no guarding, Ext No edema  .  All available labs, radiology testing, previous records reviewed. Agree with documented assessment and plan. Chest pain:  Mostly atypical features.  However with risk factors she needs stress testing.  She has knee problems and would not be able to walk on a treadmill.  Therefore she will have [place].  Jeneen Rinks Lakiyah Arntson  12:01 PM  05/08/2016

## 2016-05-08 NOTE — Progress Notes (Addendum)
Pt's BS is 64. Pt is asymptomatic. She stated that she feels her BS coming back up and wished not to take anything for the low blood sugar. Brooke, Therapist, sports and I explained risks and benefits to pt and she opted to wait until 0830 to recheck her blood sugar again to see if it comes up. She has turned off her insulin pump and will keep it off until her CBG is rechecked.    Rechecked BS at 830 and it had risen to 135. Patient has an insulin pump and it has been turned back on.  Leanna Hamid, Mervin Kung RN

## 2016-05-08 NOTE — Progress Notes (Signed)
PROGRESS NOTE  Jasmine Schwartz [id number] DOB: 1966-05-26 DOA: 05/07/2016 PCP: [name], MD  HPI/Recap of past 24 hours:  Denies pain currently, family in room  Assessment/Plan: Principal Problem:   Chest pain Active Problems:   Type 1 diabetes mellitus (HCC)   Anxiety and depression   Hypothyroidism  Chest pain:  ddimer 0.36, cxr unremarkable, Troponin negative x3, ldl 94, patient report recent a1c was 7,  Her initial ekg wit hnonspecific t wave changes, repeat ekg next morning with resolved t wave changes. She does has risk factor including type I diabetes and family history Cardiology consulted, stress test on 9/26  Type 1 diabetes mellitus on insulin pump - Patient requested to continue insulin pump - Hypoglycemic protocol initiated   Hypothyroidism: tsh 24, patient report was prescribed synthroid, but has not been taking it, restart synthroid   H/o breast cancer : finished treatment with bilateral mastectomy with chemotherapy and radiation , in remission  Anxiety/depression - continue Wellbutrin  Code Status: full  Family Communication: patient and family members in room  Disposition Plan: possible home on 9/27 pending stress test result   Consultants:  cardiology  Procedures:  Cardiac Stress test on 9/26  Antibiotics:  none   Objective: BP (!) 143/71   Pulse (!) 106 Comment: test complete  Temp 98.5 F (36.9 C) (Oral)   Resp 19   Ht 5\' 3"  (1.6 m)   Wt 71 kg (156 lb 8 oz)   SpO2 100%   BMI 27.72 kg/m  No intake or output data in the 24 hours ending 05/08/16 1613 Filed Weights   05/07/16 2317  Weight: 71 kg (156 lb 8 oz)    Exam:   General:  NAD  Cardiovascular: RRR  Respiratory: CTABL  Abdomen: Soft/ND/NT, positive BS  Musculoskeletal: No Edema  Neuro: aaox3  Data Reviewed: Basic Metabolic Panel:  Recent Labs Lab 05/07/16 1851 05/07/16 1919 05/08/16 0919  NA 135  --  137  K 3.5  --  3.7  CL 102  --  104  CO2  23  --  24  GLUCOSE 82  --  139*  BUN 15  --  15  CREATININE 0.85  --  0.75  CALCIUM 9.1  --  8.9  MG  --  1.8  --    Liver Function Tests: No results for input(s): AST, ALT, ALKPHOS, BILITOT, PROT, ALBUMIN in the last 168 hours. No results for input(s): LIPASE, AMYLASE in the last 168 hours. No results for input(s): AMMONIA in the last 168 hours. CBC:  Recent Labs Lab 05/07/16 1851 05/08/16 0919  WBC 11.3* 8.9  NEUTROABS  --  6.3  HGB 13.2 13.3  HCT 39.9 38.7  MCV 87.5 86.8  PLT 376 311   Cardiac Enzymes:    Recent Labs Lab 05/08/16 0014 05/08/16 0332 05/08/16 0919  TROPONINI <0.03 <0.03 <0.03   BNP (last 3 results) No results for input(s): BNP in the last 8760 hours.  ProBNP (last 3 results) No results for input(s): PROBNP in the last 8760 hours.  CBG:  Recent Labs Lab 05/07/16 2326 05/08/16 0427 05/08/16 0726 05/08/16 0836 05/08/16 1239  GLUCAP 191* 174* 62* 135* 120*    No results found for this or any previous visit (from the past 240 hour(s)).   Studies: Dg Chest 2 View  Result Date: 05/07/2016 CLINICAL DATA:  Chest tightness for 3 days, shortness of breath, abnormal EKG. EXAM: CHEST  2 VIEW COMPARISON:  Chest x-ray dated 07/02/2008. FINDINGS: The  heart size and mediastinal contours are within normal limits. Both lungs are clear. The visualized skeletal structures are unremarkable. Surgical clips noted in the bilateral axillae. IMPRESSION: No active cardiopulmonary disease.  Lungs are clear. Electronically Signed   By: Franki Cabot M.D.   On: 05/07/2016 19:56    Scheduled Meds: . buPROPion  150 mg Oral Daily  . enoxaparin (LOVENOX) injection  40 mg Subcutaneous QHS  . insulin pump   Subcutaneous TID AC, HS, 0200  . regadenoson        Continuous Infusions: . sodium chloride 75 mL/hr at 05/08/16 0020     Time spent: 50mins  Justino Boze MD, PhD  [facility] Pager [phone]. If 7PM-7AM, please contact night-coverage at [email or web address],  password [facility] 05/08/2016, 4:13 PM  LOS: 0 days

## 2016-05-16 ENCOUNTER — Ambulatory Visit: Payer: 59 | Admitting: Endocrinology

## 2016-05-21 ENCOUNTER — Encounter: Payer: Self-pay | Admitting: Hematology and Oncology

## 2016-05-21 ENCOUNTER — Ambulatory Visit (HOSPITAL_BASED_OUTPATIENT_CLINIC_OR_DEPARTMENT_OTHER): Payer: BLUE CROSS/BLUE SHIELD | Admitting: Hematology and Oncology

## 2016-05-21 VITALS — BP 116/64 | HR 85 | Temp 98.1°F | Resp 18 | Ht 63.0 in | Wt 160.9 lb

## 2016-05-21 DIAGNOSIS — Z853 Personal history of malignant neoplasm of breast: Secondary | ICD-10-CM | POA: Diagnosis not present

## 2016-05-21 DIAGNOSIS — E039 Hypothyroidism, unspecified: Secondary | ICD-10-CM

## 2016-05-21 DIAGNOSIS — C50411 Malignant neoplasm of upper-outer quadrant of right female breast: Secondary | ICD-10-CM

## 2016-05-21 DIAGNOSIS — Z170 Estrogen receptor positive status [ER+]: Secondary | ICD-10-CM

## 2016-05-21 NOTE — Progress Notes (Signed)
Patient Care Team: [name], MD as PCP - General [facility] [place]. Rolena Infante, MD as Referring Physician (Obstetrics and Gynecology)  SUMMARY OF ONCOLOGIC HISTORY:   Breast cancer of upper-outer quadrant of right female breast ([name])   01/01/2013 Initial Diagnosis    Mammogram showed diffuse skin thickening and enlarged right axillary lymph nodes with right breast density ultrasound showed 4.5 cm mass right axillary 3.1 cm biopsy showed IMC grade 2-3 lymph node also positive ER/PR neg HER-2 pos Ki-67 76%, MRI 8 cm mas      01/16/2013 - 05/01/2013 Neo-Adjuvant Chemotherapy    Taxotere carboplatin Herceptin and Perjeta x6 cycles      05/29/2013 Surgery    Bilateral mastectomies, left breasts no evidence of cancer right breast no evidence of cancer 40 lymph nodes negative complete pathologic response       - 02/02/2014 Chemotherapy    Adjuvant Herceptin      07/27/2013 - 09/11/2013 Radiation Therapy    Adjuvant radiation therapy       CHIEF COMPLIANT: surveillance of breast cancer  INTERVAL HISTORY: Jasmine Schwartz is a 50 year old with above-mentioned history of triple negative right breast cancer currently on surveillance. She reports that she does not have new lumps or nodules in the chest wall or axilla.She was hospitalized with chest pain and had a stress test which was normal. Her chest pain has resolved and she has not had any further problems since.  REVIEW OF SYSTEMS:   Constitutional: Denies fevers, chills or abnormal weight loss Eyes: Denies blurriness of vision Ears, nose, mouth, throat, and face: Denies mucositis or sore throat Respiratory: Denies cough, dyspnea or wheezes Cardiovascular: Denies palpitation, chest discomfort Gastrointestinal:  Denies nausea, heartburn or change in bowel habits Skin: Denies abnormal skin rashes Lymphatics: Denies new lymphadenopathy or easy bruising Neurological:Denies numbness, tingling or new weaknesses Behavioral/Psych: Mood is stable, no new  changes  Extremities: No lower extremity edema Breast:  denies any pain or lumps or nodules in either chest wall or axilla All other systems were reviewed with the patient and are negative.  I have reviewed the past medical history, past surgical history, social history and family history with the patient and they are unchanged from previous note.  ALLERGIES:  has No Known Allergies.  MEDICATIONS:  Current Outpatient Prescriptions  Medication Sig Dispense Refill  . ACCU-CHEK AVIVA PLUS test strip USE TO CHECK SUGAR 5 TIMES PER DAY 200 each 3  . aspirin EC 81 MG EC tablet Take 1 tablet (81 mg total) by mouth daily. 30 tablet 0  . Blood Glucose Monitoring Suppl (ACCU-CHEK AVIVA) device Use to check blood sugar 5 times per day 1 each 2  . buPROPion (WELLBUTRIN XL) 150 MG 24 hr tablet Take 1 tablet (150 mg total) by mouth daily. 30 tablet 11  . Insulin Human (INSULIN PUMP) SOLN Inject 1 each into the skin continuous. humalog    . Insulin Infusion Pump Supplies (MINIMED INFUSION SET-MMT 397) MISC 1 Device by Does not apply route every 3 (three) days.    . Insulin Infusion Pump Supplies (PARADIGM PUMP RESERVOIR 1.76ML) MISC 1 Device by Does not apply route every 3 (three) days.    . insulin lispro (HUMALOG) 100 UNIT/ML injection FOR USE IN PUMP TOTAL OF 60 UNITS ONCE A DAY (Patient taking differently: Inject into the skin continuous. FOR USE IN PUMP) 20 mL 0  . Lancets (ACCU-CHEK MULTICLIX) lancets Use to check blood sugar 5 times per day 200 each 2  . levothyroxine (  SYNTHROID) 50 MCG tablet Take 1 tablet (50 mcg total) by mouth daily before breakfast. (Patient not taking: Reported on 05/07/2016) 90 tablet 3   No current facility-administered medications for this visit.     PHYSICAL EXAMINATION: ECOG PERFORMANCE STATUS: 1 - Symptomatic but completely ambulatory  Vitals:   05/21/16 1434  BP: 116/64  Pulse: 85  Resp: 18  Temp: 98.1 F (36.7 C)   Filed Weights   05/21/16 1434  Weight:  160 lb 14.4 oz (73 kg)    GENERAL:alert, no distress and comfortable SKIN: skin color, texture, turgor are normal, no rashes or significant lesions EYES: normal, Conjunctiva are pink and non-injected, sclera clear OROPHARYNX:no exudate, no erythema and lips, buccal mucosa, and tongue normal  NECK: supple, thyroid normal size, non-tender, without nodularity LYMPH:  no palpable lymphadenopathy in the cervical, axillary or inguinal LUNGS: clear to auscultation and percussion with normal breathing effort HEART: regular rate & rhythm and no murmurs and no lower extremity edema ABDOMEN:abdomen soft, non-tender and normal bowel sounds MUSCULOSKELETAL:no cyanosis of digits and no clubbing  NEURO: alert & oriented x 3 with fluent speech, no focal motor/sensory deficits EXTREMITIES: No lower extremity edema BREAST:no palpable lumps or nodules in chest wall or axilla (exam performed in the presence of a chaperone)  LABORATORY DATA:  I have reviewed the data as listed   Chemistry      Component Value Date/Time   NA 137 05/08/2016 0919   NA 139 01/04/2015 1421   K 3.7 05/08/2016 0919   K 4.2 01/04/2015 1421   CL 104 05/08/2016 0919   CL 99 01/23/2013 1251   CO2 24 05/08/2016 0919   CO2 26 01/04/2015 1421   BUN 15 05/08/2016 0919   BUN 15.3 01/04/2015 1421   CREATININE 0.75 05/08/2016 0919   CREATININE 0.9 01/04/2015 1421      Component Value Date/Time   CALCIUM 8.9 05/08/2016 0919   CALCIUM 8.9 01/04/2015 1421   ALKPHOS 74 10/28/2015 0904   ALKPHOS 67 01/04/2015 1421   AST 14 10/28/2015 0904   AST 17 01/04/2015 1421   ALT 11 10/28/2015 0904   ALT 16 01/04/2015 1421   BILITOT 0.7 10/28/2015 0904   BILITOT 0.62 01/04/2015 1421       Lab Results  Component Value Date   WBC 8.9 05/08/2016   HGB 13.3 05/08/2016   HCT 38.7 05/08/2016   MCV 86.8 05/08/2016   PLT 311 05/08/2016   NEUTROABS 6.3 05/08/2016     ASSESSMENT & PLAN:  Breast cancer of upper-outer quadrant of right  female breast Stage II, HER-2/neu positive, 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 Patient has underwent neoadjuvant treatment, followed by bilateral mastectomies with a complete pathologic response. She then completed a calendar year of Herceptin.  Breast Cancer Surveillance: 1. Breast exam 06/21/16: Normal chest wall exam 2. Mammogram: No role of imaging.  Hospitalization for chest pain: Stress test was normal. Felt to be related to hypothyroidism. It has gotten better on thyroid replacement therapy. Hypothyroidism: being managed with primary care  Survivorship: Discussed the importance of physical exercise in decreasing the likelihood of breast cancer recurrence. Recommended 30 mins daily 6 days a week of either brisk walking or cycling or swimming. Encouraged patient to eat more fruits and vegetables and decrease red meat.   RTC in 1 year  No orders of the defined types were placed in this encounter.  The patient has a good understanding of the overall plan.  she agrees with it. she will call with any problems that may develop before the next visit here.   Rulon Eisenmenger, MD 05/21/16

## 2016-05-21 NOTE — Assessment & Plan Note (Signed)
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 Patient has underwent neoadjuvant treatment, followed by bilateral mastectomies with a complete pathologic response. She then completed a calendar year of Herceptin.  Breast Cancer Surveillance: 1. Breast exam 06/23/15: Normal chest wall exam 2. Mammogram: No role of imaging.  Hospitalization for chest pain: Stress test was normal. Felt to be related to hypothyroidism. It has gotten better on thyroid replacement therapy. Skin rash  Hypothyroidism: being managed with primary care  RTC in 1 year

## 2016-05-26 ENCOUNTER — Other Ambulatory Visit: Payer: Self-pay | Admitting: Endocrinology

## 2016-06-02 ENCOUNTER — Other Ambulatory Visit: Payer: Self-pay | Admitting: Endocrinology

## 2016-06-04 ENCOUNTER — Telehealth: Payer: Self-pay | Admitting: Endocrinology

## 2016-06-04 ENCOUNTER — Other Ambulatory Visit: Payer: Self-pay

## 2016-06-04 MED ORDER — INSULIN ASPART 100 UNIT/ML ~~LOC~~ SOLN: SUBCUTANEOUS | 3 refills | Status: DC

## 2016-06-04 NOTE — Telephone Encounter (Signed)
Changed patient rx from Humalog, to Novolog same dose per Dr.Ellison okay to do so. Instead of doing PA.

## 2016-06-04 NOTE — Telephone Encounter (Signed)
Ok to change to novolog--same dosage

## 2016-06-04 NOTE — Telephone Encounter (Signed)
Pt states she went to pharmacy over the weekend to pick up rx for HUMALOG 100 UNIT/ML injection.  However, since her insurances has changed it now requires a prior authorization.Pharmacy-[facility] [place], [place].  New insurance info[employer], [employer] (IT) # [id number] Group # [id number] RX Bin:  [id number]  Policy Effective [id number]

## 2016-06-25 ENCOUNTER — Ambulatory Visit: Payer: 59 | Admitting: Hematology and Oncology

## 2016-06-28 ENCOUNTER — Encounter: Payer: Self-pay | Admitting: Endocrinology

## 2016-06-28 ENCOUNTER — Ambulatory Visit (INDEPENDENT_AMBULATORY_CARE_PROVIDER_SITE_OTHER): Payer: BLUE CROSS/BLUE SHIELD | Admitting: Endocrinology

## 2016-06-28 ENCOUNTER — Other Ambulatory Visit: Payer: Self-pay | Admitting: Endocrinology

## 2016-06-28 VITALS — BP 118/62 | HR 85 | Ht 63.0 in | Wt 159.0 lb

## 2016-06-28 DIAGNOSIS — E038 Other specified hypothyroidism: Secondary | ICD-10-CM | POA: Diagnosis not present

## 2016-06-28 DIAGNOSIS — E1042 Type 1 diabetes mellitus with diabetic polyneuropathy: Secondary | ICD-10-CM | POA: Diagnosis not present

## 2016-06-28 LAB — POCT GLYCOSYLATED HEMOGLOBIN (HGB A1C): Hemoglobin A1C: 7.2

## 2016-06-28 LAB — TSH: TSH: 8.21 u[IU]/mL — ABNORMAL HIGH (ref 0.35–4.50)

## 2016-06-28 MED ORDER — LEVOTHYROXINE SODIUM 75 MCG PO TABS: 75.0000 ug | ORAL_TABLET | Freq: Every day | ORAL | 11 refills | Status: DC

## 2016-06-28 NOTE — Progress Notes (Signed)
Subjective:    Patient ID: Jasmine Schwartz, female    DOB: 1965-09-03, 50 y.o.   MRN: [id number]  HPI Pt returns for f/u of diabetes mellitus:  DM type: 1 Dx'ed: 0000000 Complications: none.  Therapy: insulin since dx DKA: never Severe hypoglycemia: never Pancreatitis: never Other: she has been on pump rx since 2004; she declined continuous glucose monitor, due to cost; she has finisher rx for her breast cancer, for now.  Interval history: she takes these pump settings:  basal rate of 1.1 units/hr, except 1.8 units/hr, 3AM-6AM (she had to reduce, due to hypoglycemia).   continue mealtime bolus of 1 unit/ 12 grams carbohydrate.  continue correction bolus (which some people call "sensitivity," or "insulin sensitivity ratio," or just "isr") of 1 unit for each 50 by which your glucose exceeds 100).   she seldom has hypoglycemia, and these episodes are mild  no cbg record, but states cbg's are well-controlled.  It is highest in the middle of the night--higher than at hs, despite not eating then.  She averages a total of approx 50 units per day.  Past Medical History:  Diagnosis Date  . Anxiety   . Anxiety and depression 07/06/2011  . Breast cancer ([place]) 01/01/13   /metastatic 1/1 lymph nodes  . Depression   . Diabetes mellitus    IDDM  . Dyslipidemia   . Hx of insertion of insulin pump ~ 2001 to present (06/09/2013)  . Lower extremity edema   . PONV (postoperative nausea and vomiting)   . Type I diabetes mellitus ([facility])    uses insulin pump  . Venous insufficiency     Past Surgical History:  Procedure Laterality Date  . BREAST BIOPSY Right 01/01/13   Invasive mammary ca,metastatic in 1/1 lymph node  . BREAST BIOPSY Left 01/09/13   Fibroadenoma,microcalcifications  . CESAREAN SECTION  1989  . LUMBAR DISC SURGERY  2009   Dr Maxie Better  . MASTECTOMY COMPLETE / SIMPLE Left 06/09/2013  . MASTECTOMY MODIFIED RADICAL Right 06/09/2013  . MASTECTOMY MODIFIED RADICAL Right 06/09/2013   Procedure:  RIGHT MASTECTOMY MODIFIED RADICAL;  Surgeon: Adin Hector, MD;  Location: [facility];  Service: General;  Laterality: Right;  . PORTACATH PLACEMENT Left 01/2013  . SIMPLE MASTECTOMY WITH AXILLARY SENTINEL NODE BIOPSY Left 06/09/2013   Procedure: LEFT SIMPLE MASTECTOMY;  Surgeon: Adin Hector, MD;  Location: [facility];  Service: General;  Laterality: Left;  . TUBAL LIGATION  1991    Social History   Social History  . Marital status: Married    Spouse name: N/A  . Number of children: 2  . Years of education: N/A   Occupational History  .  [employer]    Social History Main Topics  . Smoking status: Never Smoker  . Smokeless tobacco: Never Used  . Alcohol use No  . Drug use: No  . Sexual activity: Yes   Other Topics Concern  . Not on file   Social History Narrative  . No narrative on file    Current Outpatient Prescriptions on File Prior to Visit  Medication Sig Dispense Refill  . ACCU-CHEK AVIVA PLUS test strip USE TO CHECK SUGAR 5 TIMES PER DAY 200 each 3  . aspirin EC 81 MG EC tablet Take 1 tablet (81 mg total) by mouth daily. 30 tablet 0  . insulin aspart (NOVOLOG) 100 UNIT/ML injection Use 60 units daily in pump. 20 mL 3  . Insulin Human (INSULIN PUMP) SOLN Inject 1 each into the  skin continuous. humalog    . Insulin Infusion Pump Supplies (MINIMED INFUSION SET-MMT 397) MISC 1 Device by Does not apply route every 3 (three) days.    . Insulin Infusion Pump Supplies (PARADIGM PUMP RESERVOIR 1.76ML) MISC 1 Device by Does not apply route every 3 (three) days.    . Lancets (ACCU-CHEK MULTICLIX) lancets Use to check blood sugar 5 times per day 200 each 2  . HUMALOG 100 UNIT/ML injection FOR USE IN PUMP TOTAL OF 60 UNITS PER DAILY (Patient not taking: Reported on 06/28/2016) 20 mL 0   No current facility-administered medications on file prior to visit.     No Known Allergies  Family History  Problem Relation Age of Onset  . Coronary artery disease Mother   .  Congestive Heart Failure Maternal Grandmother   . Brain cancer Maternal Grandfather     dx in his early 32s  . Cancer Neg Hx   . Diabetes Neg Hx     BP 118/62   Pulse 85   Ht 5\' 3"  (1.6 m)   Wt 159 lb (72.1 kg)   SpO2 95%   BMI 28.17 kg/m    Review of Systems Denies LOC    Objective:   Physical Exam VITAL SIGNS:  See vs page GENERAL: no distress Pulses: dorsalis pedis intact bilat.   MSK: no deformity of the feet.  CV: no leg edema.   Skin:  no ulcer on the feet.  normal color and temp on the feet. Neuro: sensation is intact to touch on the feet.     A1c=7.2%     Assessment & Plan:  Type 1 DM: she needs increased rx Hypothyroidism: due for recheck  Patient is advised the following: Patient Instructions  Please take these pump settings:  basal rate of 1.1 units/hr, except 1.8 units/hr, 2AM-6AM.   continue mealtime bolus of 1 unit/ 12 grams carbohydrate.  continue correction bolus (which some people call "sensitivity," or "insulin sensitivity ratio," or just "isr") of 1 unit for each 50 by which your glucose exceeds 100).   check your blood sugar 4 times a day: before the 3 meals, and at bedtime.  also check if you have symptoms of your blood sugar being too high or too low.  please keep a record of the readings and bring it to your next appointment here.  You can write it on any piece of paper.  please call us sooner if your blood sugar goes below 70, or if you have a lot of readings over 200.   A thyroid blood test is requested for you today.  We'll let you know about the results.   Please come back for a regular physical appointment in 3 months.

## 2016-06-28 NOTE — Patient Instructions (Addendum)
Please take these pump settings:  basal rate of 1.1 units/hr, except 1.8 units/hr, 2AM-6AM.   continue mealtime bolus of 1 unit/ 12 grams carbohydrate.  continue correction bolus (which some people call "sensitivity," or "insulin sensitivity ratio," or just "isr") of 1 unit for each 50 by which your glucose exceeds 100).   check your blood sugar 4 times a day: before the 3 meals, and at bedtime.  also check if you have symptoms of your blood sugar being too high or too low.  please keep a record of the readings and bring it to your next appointment here.  You can write it on any piece of paper.  please call us sooner if your blood sugar goes below 70, or if you have a lot of readings over 200.   A thyroid blood test is requested for you today.  We'll let you know about the results.   Please come back for a regular physical appointment in 3 months.

## 2016-07-03 ENCOUNTER — Ambulatory Visit: Payer: BLUE CROSS/BLUE SHIELD | Admitting: Endocrinology

## 2016-07-13 ENCOUNTER — Telehealth: Payer: Self-pay | Admitting: Endocrinology

## 2016-07-13 ENCOUNTER — Other Ambulatory Visit: Payer: Self-pay

## 2016-07-13 MED ORDER — LEVOTHYROXINE SODIUM 75 MCG PO TABS: 75.0000 ug | ORAL_TABLET | Freq: Every day | ORAL | 11 refills | Status: DC

## 2016-07-13 NOTE — Telephone Encounter (Signed)
I contacted the patient and advised of my chart message from 06/28/2016.  We need to increase the thyroid pill.  I have sent a prescription to your pharmacy. This will help you feel better. I'll see you next time.   Patient verbalized understanding on this and the new dosage of 75 mcg of levothyroxine. Patient had no further questions at this time.

## 2016-07-13 NOTE — Telephone Encounter (Signed)
Patient is calling for her lab results.  

## 2016-07-30 ENCOUNTER — Other Ambulatory Visit: Payer: Self-pay | Admitting: Endocrinology

## 2016-08-07 ENCOUNTER — Other Ambulatory Visit: Payer: Self-pay

## 2016-08-07 MED ORDER — GLUCOSE BLOOD VI STRP: ORAL_STRIP | 5 refills | Status: DC

## 2016-08-31 ENCOUNTER — Other Ambulatory Visit: Payer: Self-pay | Admitting: Endocrinology

## 2016-09-18 ENCOUNTER — Telehealth: Payer: Self-pay | Admitting: Endocrinology

## 2016-09-18 MED ORDER — VALACYCLOVIR HCL 1 G PO TABS: 1000.0000 mg | ORAL_TABLET | Freq: Two times a day (BID) | ORAL | 0 refills | Status: DC

## 2016-09-18 NOTE — Telephone Encounter (Signed)
I have sent a prescription to your pharmacy  

## 2016-09-18 NOTE — Telephone Encounter (Signed)
Patient ask if Dr Loanne Drilling will write her a prescription for medication valtrex for blisters in her mouth, that came from the chemo treatments she use to take.    Please advise  If so  Send to [place], [place] - [address] AT [place] [phone] (Phone) [phone] (Fax)

## 2016-09-18 NOTE — Telephone Encounter (Signed)
Patient notified

## 2016-09-18 NOTE — Telephone Encounter (Signed)
See message and please advise, Thanks!  

## 2016-09-28 ENCOUNTER — Ambulatory Visit: Payer: BLUE CROSS/BLUE SHIELD | Admitting: Endocrinology

## 2016-10-17 ENCOUNTER — Ambulatory Visit: Payer: BLUE CROSS/BLUE SHIELD | Admitting: Endocrinology

## 2016-10-25 ENCOUNTER — Other Ambulatory Visit: Payer: Self-pay | Admitting: Endocrinology

## 2016-10-31 ENCOUNTER — Ambulatory Visit: Payer: BLUE CROSS/BLUE SHIELD | Admitting: Endocrinology

## 2016-11-23 ENCOUNTER — Other Ambulatory Visit: Payer: Self-pay | Admitting: Endocrinology

## 2016-11-27 ENCOUNTER — Ambulatory Visit (INDEPENDENT_AMBULATORY_CARE_PROVIDER_SITE_OTHER): Payer: BLUE CROSS/BLUE SHIELD | Admitting: Endocrinology

## 2016-11-27 ENCOUNTER — Encounter: Payer: Self-pay | Admitting: Endocrinology

## 2016-11-27 VITALS — BP 126/72 | HR 81 | Ht 63.0 in | Wt 162.0 lb

## 2016-11-27 DIAGNOSIS — Z0000 Encounter for general adult medical examination without abnormal findings: Secondary | ICD-10-CM | POA: Diagnosis not present

## 2016-11-27 DIAGNOSIS — E038 Other specified hypothyroidism: Secondary | ICD-10-CM

## 2016-11-27 DIAGNOSIS — Z23 Encounter for immunization: Secondary | ICD-10-CM

## 2016-11-27 DIAGNOSIS — E1042 Type 1 diabetes mellitus with diabetic polyneuropathy: Secondary | ICD-10-CM

## 2016-11-27 LAB — MICROALBUMIN / CREATININE URINE RATIO
Creatinine,U: 129.3 mg/dL
MICROALB/CREAT RATIO: 0.5 mg/g (ref 0.0–30.0)

## 2016-11-27 LAB — TSH: TSH: 0.7 u[IU]/mL (ref 0.35–4.50)

## 2016-11-27 LAB — POCT GLYCOSYLATED HEMOGLOBIN (HGB A1C): Hemoglobin A1C: 7.4

## 2016-11-27 NOTE — Patient Instructions (Addendum)
Please take these pump settings:  basal rate of 1.1 units/hr, except 1.9 units/hr, 2AM-6AM.   continue mealtime bolus of 1 unit/ 12 grams carbohydrate.  continue correction bolus (which some people call "sensitivity," or "insulin sensitivity ratio," or just "isr") of 1 unit for each 50 by which your glucose exceeds 100).   check your blood sugar 4 times a day: before the 3 meals, and at bedtime.  also check if you have symptoms of your blood sugar being too high or too low.  please keep a record of the readings and bring it to your next appointment here.  You can write it on any piece of paper.  please call us sooner if your blood sugar goes below 70, or if you have a lot of readings over 200.   A thyroid blood test is requested for you today.  We'll let you know about the results. Please come back for a follow-up appointment in 3-4 months.

## 2016-11-27 NOTE — Progress Notes (Signed)
Subjective:    Patient ID: Jasmine Schwartz, female    DOB: 1965/10/03, 51 y.o.   MRN: [id number]  HPI Pt is here for regular wellness examination, and is feeling pretty well in general, and says chronic med probs are stable, except as noted below.   Past Medical History:  Diagnosis Date  . Anxiety   . Anxiety and depression 07/06/2011  . Breast cancer ([place]) 01/01/13   /metastatic 1/1 lymph nodes  . Depression   . Diabetes mellitus    IDDM  . Dyslipidemia   . Hx of insertion of insulin pump ~ 2001 to present (06/09/2013)  . Lower extremity edema   . PONV (postoperative nausea and vomiting)   . Type I diabetes mellitus (HCC)    uses insulin pump  . Venous insufficiency     Past Surgical History:  Procedure Laterality Date  . BREAST BIOPSY Right 01/01/13   Invasive mammary ca,metastatic in 1/1 lymph node  . BREAST BIOPSY Left 01/09/13   Fibroadenoma,microcalcifications  . CESAREAN SECTION  1989  . LUMBAR DISC SURGERY  2009   Dr Maxie Better  . MASTECTOMY COMPLETE / SIMPLE Left 06/09/2013  . MASTECTOMY MODIFIED RADICAL Right 06/09/2013  . MASTECTOMY MODIFIED RADICAL Right 06/09/2013   Procedure: RIGHT MASTECTOMY MODIFIED RADICAL;  Surgeon: Adin Hector, MD;  Location: [facility];  Service: General;  Laterality: Right;  . PORTACATH PLACEMENT Left 01/2013  . SIMPLE MASTECTOMY WITH AXILLARY SENTINEL NODE BIOPSY Left 06/09/2013   Procedure: LEFT SIMPLE MASTECTOMY;  Surgeon: Adin Hector, MD;  Location: [facility];  Service: General;  Laterality: Left;  . TUBAL LIGATION  1991    Social History   Social History  . Marital status: Married    Spouse name: N/A  . Number of children: 2  . Years of education: N/A   Occupational History  .  [employer]    Social History Main Topics  . Smoking status: Never Smoker  . Smokeless tobacco: Never Used  . Alcohol use No  . Drug use: No  . Sexual activity: Yes   Other Topics Concern  . Not on file   Social History Narrative  . No  narrative on file    Current Outpatient Prescriptions on File Prior to Visit  Medication Sig Dispense Refill  . buPROPion (WELLBUTRIN XL) 150 MG 24 hr tablet TAKE 1 TABLET(150 MG) BY MOUTH DAILY 30 tablet 0  . glucose blood (BAYER CONTOUR NEXT TEST) test strip Use to check blood sugar 5 times per day. 200 each 5  . GNP ASPIRIN LOW DOSE 81 MG EC tablet TAKE 1 TABLET(81 MG) BY MOUTH DAILY 30 tablet 0  . insulin aspart (NOVOLOG) 100 UNIT/ML injection Use 60 units daily in pump. 20 mL 3  . Insulin Human (INSULIN PUMP) SOLN Inject 1 each into the skin continuous. humalog    . Insulin Infusion Pump Supplies (MINIMED INFUSION SET-MMT 397) MISC 1 Device by Does not apply route every 3 (three) days.    . Insulin Infusion Pump Supplies (PARADIGM PUMP RESERVOIR 1.76ML) MISC 1 Device by Does not apply route every 3 (three) days.    . Lancets (ACCU-CHEK MULTICLIX) lancets Use to check blood sugar 5 times per day 200 each 2  . levothyroxine (SYNTHROID, LEVOTHROID) 75 MCG tablet Take 1 tablet (75 mcg total) by mouth daily before breakfast. 30 tablet 11  . valACYclovir (VALTREX) 1000 MG tablet Take 1 tablet (1,000 mg total) by mouth 2 (two) times daily. 20 tablet 0  .  HUMALOG 100 UNIT/ML injection FOR USE IN PUMP TOTAL OF 60 UNITS PER DAILY (Patient not taking: Reported on 06/28/2016) 20 mL 0   No current facility-administered medications on file prior to visit.     No Known Allergies  Family History  Problem Relation Age of Onset  . Coronary artery disease Mother   . Congestive Heart Failure Maternal Grandmother   . Brain cancer Maternal Grandfather     dx in his early 67s  . Cancer Neg Hx   . Diabetes Neg Hx    BP 126/72   Pulse 81   Ht 5\' 3"  (1.6 m)   Wt 162 lb (73.5 kg)   SpO2 98%   BMI 28.70 kg/m   Review of Systems Constitutional: Negative for fever.  HENT: Negative for hearing loss.   Eyes: Negative for visual disturbance.  Respiratory: Negative for shortness of breath.     Gastrointestinal: Negative for blood in stool.  Endocrine: Negative for cold intolerance.  Genitourinary: Negative for hematuria.  Musculoskeletal: Negative for back pain.  Skin: Negative for rash.  Allergic/Immunologic: Negative for environmental allergies.  Neurological: Negative for syncope.  Hematological: Does not bruise/bleed easily.  Psychiatric/Behavioral: depression is well-controlled.     Objective:   Physical Exam VS: see vs page GEN: no distress HEAD: head: no deformity eyes: no periorbital swelling, no proptosis external nose and ears are normal mouth: no lesion seen NECK: supple, thyroid is not enlarged CHEST WALL: no deformity LUNGS:  Clear to auscultation BREASTS: sees gyn CV: reg rate and rhythm, no murmur ABD: abdomen is soft, nontender.  no hepatosplenomegaly.  not distended.  no hernia.  SKIN:  Insulin infusion sites at the anterior abdomen are normal GENITALIA/RECTAL: sees gyn MUSCULOSKELETAL: muscle bulk and strength are grossly normal.  no obvious joint swelling.  gait is normal and steady PULSES: no carotid bruit NEURO:  cn 2-12 grossly intact.   readily moves all 4's.   SKIN:  Normal texture and temperature.  No rash or suspicious lesion is visible.   NODES:  None palpable at the neck PSYCH: alert, well-oriented.  Does not appear anxious nor depressed.       Assessment & Plan:  Wellness visit today, with problems stable, except as noted.   SEPARATE EVALUATION FOLLOWS--EACH PROBLEM HERE IS NEW, NOT RESPONDING TO TREATMENT, OR POSES SIGNIFICANT RISK TO THE PATIENT'S HEALTH: HISTORY OF THE PRESENT ILLNESS: Pt returns for f/u of diabetes mellitus:  DM type: 1 Dx'ed: 7939 Complications: none.  Therapy: insulin since dx DKA: never Severe hypoglycemia: never Pancreatitis: never Other: she has been on pump rx since 2004; she declined continuous glucose monitor, due to cost; she has finished rx for her breast cancer, for now.  Interval history: she  takes these pump settings:  basal rate of 1.1 units/hr, except 1.8 units/hr, 2AM-6AM.   continue mealtime bolus of 1 unit/ 12 grams carbohydrate.  continue correction bolus (which some people call "sensitivity," or "insulin sensitivity ratio," or just "isr") of 1 unit for each 50 by which your glucose exceeds 100). She seldom has hypoglycemia, and these episodes are mild.  no cbg record, but states cbg's are highest fasting.     PAST MEDICAL HISTORY Past Medical History:  Diagnosis Date  . Anxiety   . Anxiety and depression 07/06/2011  . Breast cancer ([place]) 01/01/13   /metastatic 1/1 lymph nodes  . Depression   . Diabetes mellitus    IDDM  . Dyslipidemia   . Hx of insertion of insulin  pump ~ 2001 to present (06/09/2013)  . Lower extremity edema   . PONV (postoperative nausea and vomiting)   . Type I diabetes mellitus (HCC)    uses insulin pump  . Venous insufficiency     Past Surgical History:  Procedure Laterality Date  . BREAST BIOPSY Right 01/01/13   Invasive mammary ca,metastatic in 1/1 lymph node  . BREAST BIOPSY Left 01/09/13   Fibroadenoma,microcalcifications  . CESAREAN SECTION  1989  . LUMBAR DISC SURGERY  2009   Dr Maxie Better  . MASTECTOMY COMPLETE / SIMPLE Left 06/09/2013  . MASTECTOMY MODIFIED RADICAL Right 06/09/2013  . MASTECTOMY MODIFIED RADICAL Right 06/09/2013   Procedure: RIGHT MASTECTOMY MODIFIED RADICAL;  Surgeon: Adin Hector, MD;  Location: [facility];  Service: General;  Laterality: Right;  . PORTACATH PLACEMENT Left 01/2013  . SIMPLE MASTECTOMY WITH AXILLARY SENTINEL NODE BIOPSY Left 06/09/2013   Procedure: LEFT SIMPLE MASTECTOMY;  Surgeon: Adin Hector, MD;  Location: [facility];  Service: General;  Laterality: Left;  . TUBAL LIGATION  1991    Social History   Social History  . Marital status: Married    Spouse name: N/A  . Number of children: 2  . Years of education: N/A   Occupational History  .  [employer]    Social History Main Topics  .  Smoking status: Never Smoker  . Smokeless tobacco: Never Used  . Alcohol use No  . Drug use: No  . Sexual activity: Yes   Other Topics Concern  . Not on file   Social History Narrative  . No narrative on file    Current Outpatient Prescriptions on File Prior to Visit  Medication Sig Dispense Refill  . buPROPion (WELLBUTRIN XL) 150 MG 24 hr tablet TAKE 1 TABLET(150 MG) BY MOUTH DAILY 30 tablet 0  . glucose blood (BAYER CONTOUR NEXT TEST) test strip Use to check blood sugar 5 times per day. 200 each 5  . GNP ASPIRIN LOW DOSE 81 MG EC tablet TAKE 1 TABLET(81 MG) BY MOUTH DAILY 30 tablet 0  . insulin aspart (NOVOLOG) 100 UNIT/ML injection Use 60 units daily in pump. 20 mL 3  . Insulin Human (INSULIN PUMP) SOLN Inject 1 each into the skin continuous. humalog    . Insulin Infusion Pump Supplies (MINIMED INFUSION SET-MMT 397) MISC 1 Device by Does not apply route every 3 (three) days.    . Insulin Infusion Pump Supplies (PARADIGM PUMP RESERVOIR 1.76ML) MISC 1 Device by Does not apply route every 3 (three) days.    . Lancets (ACCU-CHEK MULTICLIX) lancets Use to check blood sugar 5 times per day 200 each 2  . levothyroxine (SYNTHROID, LEVOTHROID) 75 MCG tablet Take 1 tablet (75 mcg total) by mouth daily before breakfast. 30 tablet 11  . valACYclovir (VALTREX) 1000 MG tablet Take 1 tablet (1,000 mg total) by mouth 2 (two) times daily. 20 tablet 0  . HUMALOG 100 UNIT/ML injection FOR USE IN PUMP TOTAL OF 60 UNITS PER DAILY (Patient not taking: Reported on 06/28/2016) 20 mL 0   No current facility-administered medications on file prior to visit.     No Known Allergies  Family History  Problem Relation Age of Onset  . Coronary artery disease Mother   . Congestive Heart Failure Maternal Grandmother   . Brain cancer Maternal Grandfather     dx in his early 52s  . Cancer Neg Hx   . Diabetes Neg Hx     BP 126/72   Pulse  81   Ht 5\' 3"  (1.6 m)   Wt 162 lb (73.5 kg)   SpO2 98%   BMI 28.70  kg/m   REVIEW OF SYSTEMS: denies chest pain.  PHYSICAL EXAMINATION: VITAL SIGNS:  See vs page GENERAL: no distress Pulses: dorsalis pedis intact bilat.   MSK: no deformity of the feet CV: no leg edema Skin:  no ulcer on the feet.  normal color and temp on the feet. Neuro: sensation is intact to touch on the feet LAB/XRAY RESULTS: Lab Results  Component Value Date   HGBA1C 7.4 11/27/2016  IMPRESSION: Type 1 DM: she needs increased rx PLAN:  Please take these settings: basal rate of 1.1 units/hr, except 1.9 units/hr, 2AM-6AM.   mealtime bolus of 1 unit/ 12 grams carbohydrate.  correction bolus (which some people call "sensitivity," or "insulin sensitivity ratio," or just "isr") of 1 unit for each 50 by which your glucose exceeds 100).

## 2016-11-28 ENCOUNTER — Telehealth: Payer: Self-pay

## 2016-11-28 LAB — HIV ANTIBODY (ROUTINE TESTING W REFLEX): HIV: NONREACTIVE

## 2016-11-28 NOTE — Telephone Encounter (Signed)
I contacted the patient and advised results from 11/27/2016 were normal. Patient advised to call back if she had any further questions.

## 2016-12-08 ENCOUNTER — Other Ambulatory Visit: Payer: Self-pay | Admitting: Endocrinology

## 2016-12-11 ENCOUNTER — Telehealth: Payer: Self-pay | Admitting: Endocrinology

## 2016-12-11 MED ORDER — LINACLOTIDE 72 MCG PO CAPS: 72.0000 ug | ORAL_CAPSULE | Freq: Every day | ORAL | 11 refills | Status: DC

## 2016-12-11 NOTE — Telephone Encounter (Signed)
Verify no n/v/abd pain/rectal bleeding I have sent a prescription to your pharmacy

## 2016-12-11 NOTE — Telephone Encounter (Signed)
Patient stated that she is having constipation could DR Loanne Drilling prescribe her Linzess. Please advise

## 2016-12-12 NOTE — Telephone Encounter (Signed)
Very bloated and constipated but no other symptoms

## 2016-12-30 ENCOUNTER — Other Ambulatory Visit: Payer: Self-pay | Admitting: Endocrinology

## 2017-01-03 ENCOUNTER — Other Ambulatory Visit: Payer: Self-pay | Admitting: Endocrinology

## 2017-01-31 ENCOUNTER — Other Ambulatory Visit: Payer: Self-pay | Admitting: Endocrinology

## 2017-03-03 ENCOUNTER — Other Ambulatory Visit: Payer: Self-pay | Admitting: Endocrinology

## 2017-03-21 ENCOUNTER — Telehealth: Payer: Self-pay | Admitting: Endocrinology

## 2017-03-22 ENCOUNTER — Other Ambulatory Visit: Payer: Self-pay

## 2017-03-22 MED ORDER — INSULIN ASPART 100 UNIT/ML ~~LOC~~ SOLN: SUBCUTANEOUS | 2 refills | Status: DC

## 2017-03-28 NOTE — Telephone Encounter (Signed)
MEDICATION:insulin aspart (NOVOLOG) 100 UNIT/ML injection   PHARMACY: [id number] - [place], [place] - [address] AT [address]    IS THIS A 90 DAY SUPPLY : no  IS PATIENT OUT OF MEDICTAION: yes  IF NOT; HOW MUCH IS LEFT: n/a   LAST APPOINTMENT DATE:11/27/16  NEXT APPOINTMENT DATE:04/25/17  OTHER COMMENTS: was sent to the wrong pharmacy. Please fix   **Let patient know to contact pharmacy at the end of the day to make sure medication is ready. **  ** Please notify patient to allow 48-72 hours to process**  **Encourage patient to contact the pharmacy for refills or they can request refills through [facility]**

## 2017-03-29 ENCOUNTER — Other Ambulatory Visit: Payer: Self-pay

## 2017-03-29 ENCOUNTER — Ambulatory Visit: Payer: BLUE CROSS/BLUE SHIELD | Admitting: Endocrinology

## 2017-03-29 MED ORDER — INSULIN ASPART 100 UNIT/ML ~~LOC~~ SOLN: SUBCUTANEOUS | 2 refills | Status: DC

## 2017-03-29 NOTE — Telephone Encounter (Signed)
Submitted

## 2017-04-01 ENCOUNTER — Other Ambulatory Visit: Payer: Self-pay

## 2017-04-01 MED ORDER — INSULIN ASPART 100 UNIT/ML ~~LOC~~ SOLN: SUBCUTANEOUS | 2 refills | Status: DC

## 2017-04-25 ENCOUNTER — Ambulatory Visit: Payer: BLUE CROSS/BLUE SHIELD | Admitting: Endocrinology

## 2017-05-09 ENCOUNTER — Other Ambulatory Visit: Payer: Self-pay | Admitting: Endocrinology

## 2017-05-21 ENCOUNTER — Ambulatory Visit: Payer: BLUE CROSS/BLUE SHIELD | Admitting: Hematology and Oncology

## 2017-06-09 ENCOUNTER — Other Ambulatory Visit: Payer: Self-pay | Admitting: Endocrinology

## 2017-06-13 ENCOUNTER — Other Ambulatory Visit: Payer: Self-pay

## 2017-06-13 MED ORDER — INSULIN ASPART 100 UNIT/ML ~~LOC~~ SOLN: SUBCUTANEOUS | 2 refills | Status: DC

## 2017-06-17 ENCOUNTER — Other Ambulatory Visit: Payer: Self-pay

## 2017-06-19 ENCOUNTER — Ambulatory Visit: Payer: BLUE CROSS/BLUE SHIELD | Admitting: Endocrinology

## 2017-06-19 ENCOUNTER — Encounter: Payer: Self-pay | Admitting: Endocrinology

## 2017-06-19 VITALS — BP 122/60 | HR 82 | Wt 165.4 lb

## 2017-06-19 DIAGNOSIS — Z0000 Encounter for general adult medical examination without abnormal findings: Secondary | ICD-10-CM

## 2017-06-19 DIAGNOSIS — E1042 Type 1 diabetes mellitus with diabetic polyneuropathy: Secondary | ICD-10-CM | POA: Diagnosis not present

## 2017-06-19 LAB — CBC WITH DIFFERENTIAL/PLATELET
Basophils Absolute: 0.1 10*3/uL (ref 0.0–0.1)
Basophils Relative: 1.1 % (ref 0.0–3.0)
EOS PCT: 4 % (ref 0.0–5.0)
Eosinophils Absolute: 0.3 10*3/uL (ref 0.0–0.7)
HCT: 40.1 % (ref 36.0–46.0)
Hemoglobin: 13.5 g/dL (ref 12.0–15.0)
LYMPHS ABS: 1.4 10*3/uL (ref 0.7–4.0)
Lymphocytes Relative: 20.5 % (ref 12.0–46.0)
MCHC: 33.7 g/dL (ref 30.0–36.0)
MCV: 86.4 fl (ref 78.0–100.0)
MONOS PCT: 6.2 % (ref 3.0–12.0)
Monocytes Absolute: 0.4 10*3/uL (ref 0.1–1.0)
NEUTROS ABS: 4.6 10*3/uL (ref 1.4–7.7)
NEUTROS PCT: 68.2 % (ref 43.0–77.0)
PLATELETS: 353 10*3/uL (ref 150.0–400.0)
RBC: 4.65 Mil/uL (ref 3.87–5.11)
RDW: 14 % (ref 11.5–15.5)
WBC: 6.8 10*3/uL (ref 4.0–10.5)

## 2017-06-19 LAB — LIPID PANEL
Cholesterol: 211 mg/dL — ABNORMAL HIGH (ref 0–200)
HDL: 79.5 mg/dL (ref >39.00)
LDL Cholesterol: 116 mg/dL — ABNORMAL HIGH (ref 0–99)
NonHDL: 131.18
Total CHOL/HDL Ratio: 3
Triglycerides: 78 mg/dL (ref 0.0–149.0)
VLDL: 15.6 mg/dL (ref 0.0–40.0)

## 2017-06-19 LAB — BASIC METABOLIC PANEL
BUN: 22 mg/dL (ref 6–23)
CHLORIDE: 100 meq/L (ref 96–112)
CO2: 29 meq/L (ref 19–32)
Calcium: 9.4 mg/dL (ref 8.4–10.5)
Creatinine, Ser: 0.88 mg/dL (ref 0.40–1.20)
GFR: 71.66 mL/min (ref >60.00)
Glucose, Bld: 168 mg/dL — ABNORMAL HIGH (ref 70–99)
Potassium: 4.6 mEq/L (ref 3.5–5.1)
SODIUM: 135 meq/L (ref 135–145)

## 2017-06-19 LAB — URINALYSIS, ROUTINE W REFLEX MICROSCOPIC
BILIRUBIN URINE: NEGATIVE
Ketones, ur: NEGATIVE
Leukocytes, UA: NEGATIVE
NITRITE: NEGATIVE
PH: 5.5 (ref 5.0–8.0)
Specific Gravity, Urine: 1.025 (ref 1.000–1.030)
Total Protein, Urine: NEGATIVE
Urine Glucose: 250 — AB
Urobilinogen, UA: 0.2 (ref 0.0–1.0)

## 2017-06-19 LAB — MICROALBUMIN / CREATININE URINE RATIO
Creatinine,U: 119.5 mg/dL
MICROALB/CREAT RATIO: 0.6 mg/g (ref 0.0–30.0)

## 2017-06-19 LAB — HEPATIC FUNCTION PANEL
ALBUMIN: 3.9 g/dL (ref 3.5–5.2)
ALT: 13 U/L (ref 0–35)
AST: 16 U/L (ref 0–37)
Alkaline Phosphatase: 71 U/L (ref 39–117)
Bilirubin, Direct: 0.1 mg/dL (ref 0.0–0.3)
Total Bilirubin: 0.5 mg/dL (ref 0.2–1.2)
Total Protein: 7.3 g/dL (ref 6.0–8.3)

## 2017-06-19 LAB — TSH: TSH: 8.26 u[IU]/mL — ABNORMAL HIGH (ref 0.35–4.50)

## 2017-06-19 LAB — POCT GLYCOSYLATED HEMOGLOBIN (HGB A1C): Hemoglobin A1C: 7.2

## 2017-06-19 MED ORDER — LEVOTHYROXINE SODIUM 88 MCG PO TABS: 88.0000 ug | ORAL_TABLET | Freq: Every day | ORAL | 3 refills | Status: DC

## 2017-06-19 NOTE — Patient Instructions (Addendum)
Please take these settings:   basal rate of 1.1 units/hr, except 1.9 units/hr, 2AM-6AM.   mealtime bolus of 1 unit/ 12 grams carbohydrate, except subtract 1 unit from calculated supper bolus.   correction bolus (which some people call "sensitivity," or "insulin sensitivity ratio," or just "isr") of 1 unit for each 50 by which your glucose exceeds 100).   Please see Vaughan Basta, to consider continuous glucose monitor.   Please come back for a follow-up appointment in 3 months blood tests are requested for you today.  We'll let you know about the results.

## 2017-06-19 NOTE — Progress Notes (Signed)
Subjective:    Patient ID: Jasmine Schwartz, female    DOB: [birth year], 51 y.o.   MRN: [id number]  HPI Pt returns for f/u of diabetes mellitus:  DM type: 1 Dx'ed: 5643 Complications: none.  Therapy: insulin since dx DKA: never Severe hypoglycemia: never Pancreatitis: never Other: she has been on pump rx since 2004; she declined continuous glucose monitor, due to cost; she has finished rx for her breast cancer, for now.  Interval history: she takes these pump settings:  basal rate of 1.1 units/hr, except 1.9 units/hr, 2AM-6AM.   mealtime bolus of 1 unit/ 12 grams carbohydrate.  correction bolus (which some people call "sensitivity," or "insulin sensitivity ratio," or just "isr") of 1 unit for each 50 by which your glucose exceeds 100).   Meter is downloaded today, and the printout is scanned into the record.  cbg's vary from 59-355.  She has mild hypoglycemia approx twice per month.  This usually happens after supper.   Past Medical History:  Diagnosis Date  . Anxiety   . Anxiety and depression 07/06/2011  . Breast cancer ([place]) 01/01/13   /metastatic 1/1 lymph nodes  . Depression   . Diabetes mellitus    IDDM  . Dyslipidemia   . Hx of insertion of insulin pump ~ 2001 to present (06/09/2013)  . Lower extremity edema   . PONV (postoperative nausea and vomiting)   . Type I diabetes mellitus (HCC)    uses insulin pump  . Venous insufficiency     Past Surgical History:  Procedure Laterality Date  . BREAST BIOPSY Right 01/01/13   Invasive mammary ca,metastatic in 1/1 lymph node  . BREAST BIOPSY Left 01/09/13   Fibroadenoma,microcalcifications  . CESAREAN SECTION  1989  . LUMBAR DISC SURGERY  2009   Dr Maxie Better  . MASTECTOMY COMPLETE / SIMPLE Left 06/09/2013  . MASTECTOMY MODIFIED RADICAL Right 06/09/2013  . PORTACATH PLACEMENT Left 01/2013  . TUBAL LIGATION  1991    Social History   Socioeconomic History  . Marital status: Married    Spouse name: Not on file  . Number of  children: 2  . Years of education: Not on file  . Highest education level: Not on file  Social Needs  . Financial resource strain: Not on file  . Food insecurity - worry: Not on file  . Food insecurity - inability: Not on file  . Transportation needs - medical: Not on file  . Transportation needs - non-medical: Not on file  Occupational History    Employer: [employer]   Tobacco Use  . Smoking status: Never Smoker  . Smokeless tobacco: Never Used  Substance and Sexual Activity  . Alcohol use: No  . Drug use: No  . Sexual activity: Yes  Other Topics Concern  . Not on file  Social History Narrative  . Not on file    Current Outpatient Medications on File Prior to Visit  Medication Sig Dispense Refill  . ASPIRIN LOW DOSE 81 MG EC tablet TAKE 1 TABLET(81 MG) BY MOUTH DAILY 30 tablet 0  . buPROPion (WELLBUTRIN XL) 150 MG 24 hr tablet TAKE 1 TABLET(150 MG) BY MOUTH DAILY 30 tablet 0  . CONTOUR NEXT TEST test strip TEST BLOOD SUGAR FIVE TIMES DAILY 150 each 11  . insulin aspart (NOVOLOG) 100 UNIT/ML injection INJECT 60 UNITS DAILY IN PUMP AS DIRECTED 20 mL 2  . Insulin Human (INSULIN PUMP) SOLN Inject 1 each into the skin continuous. humalog    . Insulin  Infusion Pump Supplies (MINIMED INFUSION SET-MMT 397) MISC 1 Device by Does not apply route every 3 (three) days.    . Insulin Infusion Pump Supplies (PARADIGM PUMP RESERVOIR 1.76ML) MISC 1 Device by Does not apply route every 3 (three) days.    . Lancets (ACCU-CHEK MULTICLIX) lancets Use to check blood sugar 5 times per day 200 each 2  . linaclotide (LINZESS) 72 MCG capsule Take 1 capsule (72 mcg total) by mouth daily before breakfast. 30 capsule 11  . valACYclovir (VALTREX) 1000 MG tablet Take 1 tablet (1,000 mg total) by mouth 2 (two) times daily. 20 tablet 0   No current facility-administered medications on file prior to visit.     No Known Allergies  Family History  Problem Relation Age of Onset  . Coronary artery  disease Mother   . Congestive Heart Failure Maternal Grandmother   . Brain cancer Maternal Grandfather        dx in his early 54s  . Cancer Neg Hx   . Diabetes Neg Hx     BP 122/60 (BP Location: Left Arm, Patient Position: Sitting, Cuff Size: Normal)   Pulse 82   Wt 165 lb 6.4 oz (75 kg)   SpO2 98%   BMI 29.30 kg/m    Review of Systems Denies LOC.      Objective:   Physical Exam VITAL SIGNS:  See vs page GENERAL: no distress Pulses: foot pulses are intact bilaterally.   MSK: no deformity of the feet or ankles.  CV: no edema of the legs or ankles Skin:  no ulcer on the feet or ankles.  normal color and temp on the feet and ankles Neuro: sensation is intact to touch on the feet and ankles.    Lab Results  Component Value Date   TSH 0.70 11/27/2016   Lab Results  Component Value Date   HGBA1C 7.2 06/19/2017      Assessment & Plan:  Type 1 DM: she needs increased rx, if it can be done with a regimen that avoids or minimizes hypoglycemia.  Patient Instructions  Please take these settings:   basal rate of 1.1 units/hr, except 1.9 units/hr, 2AM-6AM.   mealtime bolus of 1 unit/ 12 grams carbohydrate, except subtract 1 unit from calculated supper bolus.   correction bolus (which some people call "sensitivity," or "insulin sensitivity ratio," or just "isr") of 1 unit for each 50 by which your glucose exceeds 100).   Please see Vaughan Basta, to consider continuous glucose monitor.   Please come back for a follow-up appointment in 3 months blood tests are requested for you today.  We'll let you know about the results.

## 2017-06-20 ENCOUNTER — Other Ambulatory Visit: Payer: Self-pay

## 2017-06-20 MED ORDER — INSULIN LISPRO 100 UNIT/ML ~~LOC~~ SOLN: SUBCUTANEOUS | 0 refills | Status: DC

## 2017-06-25 ENCOUNTER — Telehealth: Payer: Self-pay | Admitting: Hematology and Oncology

## 2017-06-25 ENCOUNTER — Ambulatory Visit (HOSPITAL_BASED_OUTPATIENT_CLINIC_OR_DEPARTMENT_OTHER): Payer: 59 | Admitting: Hematology and Oncology

## 2017-06-25 DIAGNOSIS — E039 Hypothyroidism, unspecified: Secondary | ICD-10-CM | POA: Diagnosis not present

## 2017-06-25 DIAGNOSIS — Z170 Estrogen receptor positive status [ER+]: Secondary | ICD-10-CM

## 2017-06-25 DIAGNOSIS — Z853 Personal history of malignant neoplasm of breast: Secondary | ICD-10-CM

## 2017-06-25 DIAGNOSIS — C50411 Malignant neoplasm of upper-outer quadrant of right female breast: Secondary | ICD-10-CM

## 2017-06-25 NOTE — Telephone Encounter (Signed)
Gave patient AVS and calendar of upcoming November 2019 appointments.

## 2017-06-25 NOTE — Progress Notes (Signed)
Patient Care Team: [name], MD as PCP - General Sid Falcon., MD as Referring Physician (Obstetrics and Gynecology)  DIAGNOSIS:  Encounter Diagnosis  Name Primary?  . Malignant neoplasm of upper-outer quadrant of right breast in female, estrogen receptor positive ([place])     SUMMARY OF ONCOLOGIC HISTORY:   Breast cancer of upper-outer quadrant of right female breast ([place])   01/01/2013 Initial Diagnosis    Mammogram showed diffuse skin thickening and enlarged right axillary lymph nodes with right breast density ultrasound showed 4.5 cm mass right axillary 3.1 cm biopsy showed IMC grade 2-3 lymph node also positive ER/PR neg HER-2 pos Ki-67 76%, MRI 8 cm mas      01/16/2013 - 05/01/2013 Neo-Adjuvant Chemotherapy    Taxotere carboplatin Herceptin and Perjeta x6 cycles      05/29/2013 Surgery    Bilateral mastectomies, left breasts no evidence of cancer right breast no evidence of cancer 40 lymph nodes negative complete pathologic response       - 02/02/2014 Chemotherapy    Adjuvant Herceptin      07/27/2013 - 09/11/2013 Radiation Therapy    Adjuvant radiation therapy       CHIEF COMPLIANT: Surveillance of breast cancer  INTERVAL HISTORY: Jasmine Schwartz is a 50 year old with above-mentioned history of right breast cancer who underwent bilateral mastectomies after neoadjuvant chemotherapy.  She underwent radiation and is currently on observation.  She denies any new lumps or nodules in the chest or axilla.  REVIEW OF SYSTEMS:   Constitutional: Denies fevers, chills or abnormal weight loss Eyes: Denies blurriness of vision Ears, nose, mouth, throat, and face: Denies mucositis or sore throat Respiratory: Denies cough, dyspnea or wheezes Cardiovascular: Denies palpitation, chest discomfort Gastrointestinal:  Denies nausea, heartburn or change in bowel habits Skin: Denies abnormal skin rashes Lymphatics: Denies new lymphadenopathy or easy bruising Neurological:Denies numbness,  tingling or new weaknesses Behavioral/Psych: Mood is stable, no new changes  Extremities: No lower extremity edema Breast: Bilateral mastectomies All other systems were reviewed with the patient and are negative.  I have reviewed the past medical history, past surgical history, social history and family history with the patient and they are unchanged from previous note.  ALLERGIES:  has No Known Allergies.  MEDICATIONS:  Current Outpatient Medications  Medication Sig Dispense Refill  . ASPIRIN LOW DOSE 81 MG EC tablet TAKE 1 TABLET(81 MG) BY MOUTH DAILY 30 tablet 0  . buPROPion (WELLBUTRIN XL) 150 MG 24 hr tablet TAKE 1 TABLET(150 MG) BY MOUTH DAILY 30 tablet 0  . CONTOUR NEXT TEST test strip TEST BLOOD SUGAR FIVE TIMES DAILY 150 each 11  . insulin aspart (NOVOLOG) 100 UNIT/ML injection INJECT 60 UNITS DAILY IN PUMP AS DIRECTED 20 mL 2  . Insulin Human (INSULIN PUMP) SOLN Inject 1 each into the skin continuous. humalog    . Insulin Infusion Pump Supplies (MINIMED INFUSION SET-MMT 397) MISC 1 Device by Does not apply route every 3 (three) days.    . Insulin Infusion Pump Supplies (PARADIGM PUMP RESERVOIR 1.76ML) MISC 1 Device by Does not apply route every 3 (three) days.    . insulin lispro (HUMALOG) 100 UNIT/ML injection FOR USE IN PUMP TOTAL OF 60 UNITS PER DAILY 20 mL 0  . Lancets (ACCU-CHEK MULTICLIX) lancets Use to check blood sugar 5 times per day 200 each 2  . levothyroxine (SYNTHROID, LEVOTHROID) 88 MCG tablet Take 1 tablet (88 mcg total) daily before breakfast by mouth. 90 tablet 3  . linaclotide (LINZESS) 72 MCG  capsule Take 1 capsule (72 mcg total) by mouth daily before breakfast. 30 capsule 11  . valACYclovir (VALTREX) 1000 MG tablet Take 1 tablet (1,000 mg total) by mouth 2 (two) times daily. 20 tablet 0   No current facility-administered medications for this visit.     PHYSICAL EXAMINATION: ECOG PERFORMANCE STATUS: 1 - Symptomatic but completely ambulatory  Vitals:    06/25/17 1537  BP: (!) 125/58  Pulse: 89  Resp: 20  Temp: 97.9 F (36.6 C)  SpO2: 100%   Filed Weights   06/25/17 1537  Weight: 167 lb (75.8 kg)    GENERAL:alert, no distress and comfortable SKIN: skin color, texture, turgor are normal, no rashes or significant lesions EYES: normal, Conjunctiva are pink and non-injected, sclera clear OROPHARYNX:no exudate, no erythema and lips, buccal mucosa, and tongue normal  NECK: supple, thyroid normal size, non-tender, without nodularity LYMPH:  no palpable lymphadenopathy in the cervical, axillary or inguinal LUNGS: clear to auscultation and percussion with normal breathing effort HEART: regular rate & rhythm and no murmurs and no lower extremity edema ABDOMEN:abdomen soft, non-tender and normal bowel sounds MUSCULOSKELETAL:no cyanosis of digits and no clubbing  NEURO: alert & oriented x 3 with fluent speech, no focal motor/sensory deficits EXTREMITIES: No lower extremity edema BREAST: No palpable masses or nodules in either right or left chest wall or axilla (exam performed in the presence of a chaperone)  LABORATORY DATA:  I have reviewed the data as listed   Chemistry      Component Value Date/Time   NA 135 06/19/2017 0841   NA 139 01/04/2015 1421   K 4.6 06/19/2017 0841   K 4.2 01/04/2015 1421   CL 100 06/19/2017 0841   CL 99 01/23/2013 1251   CO2 29 06/19/2017 0841   CO2 26 01/04/2015 1421   BUN 22 06/19/2017 0841   BUN 15.3 01/04/2015 1421   CREATININE 0.88 06/19/2017 0841   CREATININE 0.9 01/04/2015 1421      Component Value Date/Time   CALCIUM 9.4 06/19/2017 0841   CALCIUM 8.9 01/04/2015 1421   ALKPHOS 71 06/19/2017 0841   ALKPHOS 67 01/04/2015 1421   AST 16 06/19/2017 0841   AST 17 01/04/2015 1421   ALT 13 06/19/2017 0841   ALT 16 01/04/2015 1421   BILITOT 0.5 06/19/2017 0841   BILITOT 0.62 01/04/2015 1421       Lab Results  Component Value Date   WBC 6.8 06/19/2017   HGB 13.5 06/19/2017   HCT 40.1  06/19/2017   MCV 86.4 06/19/2017   PLT 353.0 06/19/2017   NEUTROABS 4.6 06/19/2017    ASSESSMENT & PLAN:  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 Patient has underwent neoadjuvant treatment, followed by bilateral mastectomies with a complete pathologic response. She then completed a calendar year of Herceptin.  Breast Cancer Surveillance: 1. Breast exam 07/12/2017: Normal chest wall exam 2. Mammogram: No role of imaging.  Hypothyroidism: being managed with primary care, recent TSH was very high and her Synthroid dose was adjusted.  RTC in 1 year to follow with our survivorship nurse practitioner for long-term survivorship care  I spent 25 minutes talking to the patient of which more than half was spent in counseling and coordination of care.  No orders of the defined types were placed in this encounter.  The patient has a good understanding of the overall plan. she agrees with it.  she will call with any problems that may develop before the next visit here.   Rulon Eisenmenger, MD 06/25/17

## 2017-06-25 NOTE — Assessment & Plan Note (Signed)
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 Patient has underwent neoadjuvant treatment, followed by bilateral mastectomies with a complete pathologic response. She then completed a calendar year of Herceptin.  Breast Cancer Surveillance: 1. Breast exam 07/12/2017: Normal chest wall exam 2. Mammogram: No role of imaging.  Hypothyroidism: being managed with primary care   RTC in 1 year

## 2017-07-11 DIAGNOSIS — R55 Syncope and collapse: Secondary | ICD-10-CM | POA: Diagnosis not present

## 2017-07-11 DIAGNOSIS — R404 Transient alteration of awareness: Secondary | ICD-10-CM | POA: Diagnosis not present

## 2017-07-12 ENCOUNTER — Observation Stay (HOSPITAL_COMMUNITY): Payer: 59

## 2017-07-12 ENCOUNTER — Encounter (HOSPITAL_COMMUNITY): Payer: Self-pay

## 2017-07-12 ENCOUNTER — Emergency Department (HOSPITAL_COMMUNITY): Payer: 59

## 2017-07-12 ENCOUNTER — Observation Stay (HOSPITAL_BASED_OUTPATIENT_CLINIC_OR_DEPARTMENT_OTHER): Payer: 59

## 2017-07-12 ENCOUNTER — Other Ambulatory Visit: Payer: Self-pay

## 2017-07-12 ENCOUNTER — Observation Stay (HOSPITAL_COMMUNITY)
Admission: EM | Admit: 2017-07-12 | Discharge: 2017-07-12 | Disposition: A | Discharge status: Home / Self Care | Payer: 59 | Attending: Family Medicine | Admitting: Family Medicine

## 2017-07-12 DIAGNOSIS — Z853 Personal history of malignant neoplasm of breast: Secondary | ICD-10-CM | POA: Diagnosis not present

## 2017-07-12 DIAGNOSIS — R569 Unspecified convulsions: Secondary | ICD-10-CM | POA: Diagnosis not present

## 2017-07-12 DIAGNOSIS — Z9889 Other specified postprocedural states: Secondary | ICD-10-CM | POA: Diagnosis not present

## 2017-07-12 DIAGNOSIS — R55 Syncope and collapse: Secondary | ICD-10-CM

## 2017-07-12 DIAGNOSIS — E039 Hypothyroidism, unspecified: Secondary | ICD-10-CM | POA: Diagnosis present

## 2017-07-12 DIAGNOSIS — R4020 Unspecified coma: Secondary | ICD-10-CM

## 2017-07-12 DIAGNOSIS — F419 Anxiety disorder, unspecified: Secondary | ICD-10-CM | POA: Insufficient documentation

## 2017-07-12 DIAGNOSIS — E7800 Pure hypercholesterolemia, unspecified: Secondary | ICD-10-CM | POA: Diagnosis not present

## 2017-07-12 DIAGNOSIS — F329 Major depressive disorder, single episode, unspecified: Secondary | ICD-10-CM | POA: Insufficient documentation

## 2017-07-12 DIAGNOSIS — Z9641 Presence of insulin pump (external) (internal): Secondary | ICD-10-CM | POA: Insufficient documentation

## 2017-07-12 DIAGNOSIS — Z8249 Family history of ischemic heart disease and other diseases of the circulatory system: Secondary | ICD-10-CM | POA: Diagnosis not present

## 2017-07-12 DIAGNOSIS — Z79899 Other long term (current) drug therapy: Secondary | ICD-10-CM | POA: Diagnosis not present

## 2017-07-12 DIAGNOSIS — Z794 Long term (current) use of insulin: Secondary | ICD-10-CM | POA: Diagnosis not present

## 2017-07-12 DIAGNOSIS — Z79890 Hormone replacement therapy (postmenopausal): Secondary | ICD-10-CM | POA: Insufficient documentation

## 2017-07-12 DIAGNOSIS — E785 Hyperlipidemia, unspecified: Secondary | ICD-10-CM | POA: Diagnosis not present

## 2017-07-12 DIAGNOSIS — I872 Venous insufficiency (chronic) (peripheral): Secondary | ICD-10-CM | POA: Diagnosis not present

## 2017-07-12 DIAGNOSIS — Z808 Family history of malignant neoplasm of other organs or systems: Secondary | ICD-10-CM | POA: Diagnosis not present

## 2017-07-12 DIAGNOSIS — Z9851 Tubal ligation status: Secondary | ICD-10-CM | POA: Insufficient documentation

## 2017-07-12 DIAGNOSIS — Z9013 Acquired absence of bilateral breasts and nipples: Secondary | ICD-10-CM | POA: Insufficient documentation

## 2017-07-12 DIAGNOSIS — E109 Type 1 diabetes mellitus without complications: Secondary | ICD-10-CM | POA: Diagnosis present

## 2017-07-12 DIAGNOSIS — Z7982 Long term (current) use of aspirin: Secondary | ICD-10-CM | POA: Diagnosis not present

## 2017-07-12 DIAGNOSIS — E1042 Type 1 diabetes mellitus with diabetic polyneuropathy: Secondary | ICD-10-CM

## 2017-07-12 LAB — ECHOCARDIOGRAM COMPLETE
Area-P 1/2: 3.44 cm2
CHL CUP TV REG PEAK VELOCITY: 182 cm/s
E decel time: 218 msec
EERAT: 7.68
FS: 40 % (ref 28–44)
HEIGHTINCHES: 63 in
IVS/LV PW RATIO, ED: 1.1
LA diam end sys: 25 mm
LA vol A4C: 24.9 ml
LA vol index: 12.7 mL/m2
LA vol: 23.3 mL
LADIAMINDEX: 1.36 cm/m2
LASIZE: 25 mm
LV sys vol: 17 mL
LVDIAVOL: 50 mL (ref 46–106)
LVDIAVOLIN: 27 mL/m2
LVEEAVG: 7.68
LVEEMED: 7.68
LVOT VTI: 24.7 cm
LVOT area: 2.27 cm2
LVOT diameter: 17 mm
LVOT peak grad rest: 4 mmHg
LVOTPV: 104 cm/s
LVOTSV: 56 mL
LVSYSVOLIN: 9 mL/m2
MV Dec: 218
MV pk A vel: 117 m/s
MV pk E vel: 97.5 m/s
MVPG: 4 mmHg
P 1/2 time: 64 ms
PW: 8.24 mm — AB (ref 0.6–1.1)
RV LATERAL S' VELOCITY: 10.8 cm/s
RV TAPSE: 20.3 mm
RV sys press: 16 mmHg
Simpson's disk: 65
Stroke v: 33 ml
TDI e' medial: 12.7
TR max vel: 182 cm/s
WEIGHTICAEL: 2624 [oz_av]

## 2017-07-12 LAB — BASIC METABOLIC PANEL
ANION GAP: 5 (ref 5–15)
ANION GAP: 8 (ref 5–15)
BUN: 19 mg/dL (ref 6–20)
BUN: 16 mg/dL (ref 6–20)
CALCIUM: 8.5 mg/dL — AB (ref 8.9–10.3)
CALCIUM: 8.6 mg/dL — AB (ref 8.9–10.3)
CHLORIDE: 103 mmol/L (ref 101–111)
CO2: 26 mmol/L (ref 22–32)
CO2: 23 mmol/L (ref 22–32)
CREATININE: 0.8 mg/dL (ref 0.44–1.00)
Chloride: 102 mmol/L (ref 101–111)
Creatinine, Ser: 0.84 mg/dL (ref 0.44–1.00)
GFR calc non Af Amer: >60 mL/min (ref >60)
Glucose, Bld: 71 mg/dL (ref 65–99)
Glucose, Bld: 210 mg/dL — ABNORMAL HIGH (ref 65–99)
Potassium: 6.2 mmol/L — ABNORMAL HIGH (ref 3.5–5.1)
Potassium: 4 mmol/L (ref 3.5–5.1)
SODIUM: 133 mmol/L — AB (ref 135–145)
SODIUM: 134 mmol/L — AB (ref 135–145)

## 2017-07-12 LAB — CBC
HCT: 36 % (ref 36.0–46.0)
HCT: 36.9 % (ref 36.0–46.0)
HEMOGLOBIN: 12.1 g/dL (ref 12.0–15.0)
HEMOGLOBIN: 12.5 g/dL (ref 12.0–15.0)
MCH: 28.6 pg (ref 26.0–34.0)
MCH: 28.7 pg (ref 26.0–34.0)
MCHC: 33.6 g/dL (ref 30.0–36.0)
MCHC: 33.9 g/dL (ref 30.0–36.0)
MCV: 84.6 fL (ref 78.0–100.0)
MCV: 85.1 fL (ref 78.0–100.0)
PLATELETS: 310 10*3/uL (ref 150–400)
Platelets: 373 10*3/uL (ref 150–400)
RBC: 4.23 MIL/uL (ref 3.87–5.11)
RBC: 4.36 MIL/uL (ref 3.87–5.11)
RDW: 13.6 % (ref 11.5–15.5)
RDW: 13.8 % (ref 11.5–15.5)
WBC: 12.6 10*3/uL — AB (ref 4.0–10.5)
WBC: 9.7 10*3/uL (ref 4.0–10.5)

## 2017-07-12 LAB — CBG MONITORING, ED
GLUCOSE-CAPILLARY: 220 mg/dL — AB (ref 65–99)
GLUCOSE-CAPILLARY: 68 mg/dL (ref 65–99)
Glucose-Capillary: 106 mg/dL — ABNORMAL HIGH (ref 65–99)
Glucose-Capillary: 112 mg/dL — ABNORMAL HIGH (ref 65–99)
Glucose-Capillary: 184 mg/dL — ABNORMAL HIGH (ref 65–99)
Glucose-Capillary: 285 mg/dL — ABNORMAL HIGH (ref 65–99)

## 2017-07-12 LAB — URINALYSIS, ROUTINE W REFLEX MICROSCOPIC
BILIRUBIN URINE: NEGATIVE
GLUCOSE, UA: 50 mg/dL — AB
Ketones, ur: NEGATIVE mg/dL
NITRITE: NEGATIVE
Protein, ur: NEGATIVE mg/dL
SPECIFIC GRAVITY, URINE: 1.013 (ref 1.005–1.030)
pH: 6 (ref 5.0–8.0)

## 2017-07-12 LAB — TROPONIN I: Troponin I: <0.03 ng/mL (ref <0.03)

## 2017-07-12 LAB — I-STAT BETA HCG BLOOD, ED (MC, WL, AP ONLY): I-stat hCG, quantitative: <5 m[IU]/mL (ref <5)

## 2017-07-12 LAB — RAPID URINE DRUG SCREEN, HOSP PERFORMED
Amphetamines: NOT DETECTED
BARBITURATES: NOT DETECTED
BENZODIAZEPINES: NOT DETECTED
COCAINE: NOT DETECTED
OPIATES: NOT DETECTED
Tetrahydrocannabinol: NOT DETECTED

## 2017-07-12 LAB — POTASSIUM: Potassium: 3.9 mmol/L (ref 3.5–5.1)

## 2017-07-12 MED ORDER — ONDANSETRON HCL 4 MG/2ML IJ SOLN
4.0000 mg | Freq: Once | INTRAMUSCULAR | Status: AC
  Administered 2017-07-12: 4 mg via INTRAVENOUS
  Filled 2017-07-12: qty 2

## 2017-07-12 MED ORDER — SODIUM CHLORIDE 0.9 % IV SOLN
INTRAVENOUS | Status: DC
  Administered 2017-07-12: 07:00:00 via INTRAVENOUS

## 2017-07-12 MED ORDER — LINACLOTIDE 72 MCG PO CAPS
72.0000 ug | ORAL_CAPSULE | Freq: Every day | ORAL | Status: DC
  Filled 2017-07-12: qty 1

## 2017-07-12 MED ORDER — ONDANSETRON HCL 4 MG PO TABS: 4.0000 mg | ORAL_TABLET | Freq: Four times a day (QID) | ORAL | Status: DC | PRN

## 2017-07-12 MED ORDER — ONDANSETRON HCL 4 MG/2ML IJ SOLN: 4.0000 mg | Freq: Four times a day (QID) | INTRAMUSCULAR | Status: DC | PRN

## 2017-07-12 MED ORDER — LEVOTHYROXINE SODIUM 88 MCG PO TABS
88.0000 ug | ORAL_TABLET | Freq: Every day | ORAL | Status: DC
  Administered 2017-07-12: 88 ug via ORAL
  Filled 2017-07-12: qty 1

## 2017-07-12 MED ORDER — SODIUM CHLORIDE 0.9 % IV BOLUS (SEPSIS)
1000.0000 mL | Freq: Once | INTRAVENOUS | Status: AC
  Administered 2017-07-12: 1000 mL via INTRAVENOUS

## 2017-07-12 MED ORDER — ACETAMINOPHEN 650 MG RE SUPP: 650.0000 mg | Freq: Four times a day (QID) | RECTAL | Status: DC | PRN

## 2017-07-12 MED ORDER — ACETAMINOPHEN 325 MG PO TABS: 650.0000 mg | ORAL_TABLET | Freq: Four times a day (QID) | ORAL | Status: DC | PRN

## 2017-07-12 MED ORDER — BUPROPION HCL ER (XL) 150 MG PO TB24
150.0000 mg | ORAL_TABLET | Freq: Every day | ORAL | Status: DC
  Filled 2017-07-12: qty 1

## 2017-07-12 MED ORDER — ASPIRIN EC 81 MG PO TBEC
81.0000 mg | DELAYED_RELEASE_TABLET | Freq: Every day | ORAL | Status: DC
  Administered 2017-07-12: 81 mg via ORAL
  Filled 2017-07-12: qty 1

## 2017-07-12 MED ORDER — GADOBENATE DIMEGLUMINE 529 MG/ML IV SOLN
15.0000 mL | Freq: Once | INTRAVENOUS | Status: AC | PRN
  Administered 2017-07-12: 15 mL via INTRAVENOUS

## 2017-07-12 MED ORDER — ENOXAPARIN SODIUM 40 MG/0.4ML ~~LOC~~ SOLN: 40.0000 mg | SUBCUTANEOUS | Status: DC

## 2017-07-12 MED ORDER — INSULIN PUMP
Freq: Three times a day (TID) | SUBCUTANEOUS | Status: DC
  Administered 2017-07-12: 1.5 via SUBCUTANEOUS
  Administered 2017-07-12: 13:00:00 via SUBCUTANEOUS
  Filled 2017-07-12: qty 1

## 2017-07-12 NOTE — ED Notes (Signed)
Patient transported to Echo. Pt to be transported back to 5c Bed 6

## 2017-07-12 NOTE — ED Notes (Signed)
Patient ambulated with no difficulty @ 100%

## 2017-07-12 NOTE — Discharge Summary (Signed)
Jasmine Schwartz, is a 51 y.o. female  DOB June 13, 1966  MRN [id number].  Admission date:  07/12/2017  Admitting Physician  No admitting provider for patient encounter.  Discharge Date:  07/12/2017   Primary MD  [name], MD  Recommendations for primary care physician for things to follow:   Call back for results of carotid artery Dopplers and echocardiogram  Admission Diagnosis  syncope    Discharge Diagnosis  syncope     Principal Problem:   Syncope Active Problems:   Type 1 diabetes mellitus ([place])   Hypothyroidism      Past Medical History:  Diagnosis Date  . Anxiety   . Anxiety and depression 07/06/2011  . Breast cancer ([place]) 01/01/13   /metastatic 1/1 lymph nodes  . Depression   . Diabetes mellitus    IDDM  . Dyslipidemia   . Hx of insertion of insulin pump ~ 2001 to present (06/09/2013)  . Lower extremity edema   . PONV (postoperative nausea and vomiting)   . Type I diabetes mellitus ([facility])    uses insulin pump  . Venous insufficiency     Past Surgical History:  Procedure Laterality Date  . BREAST BIOPSY Right 01/01/13   Invasive mammary ca,metastatic in 1/1 lymph node  . BREAST BIOPSY Left 01/09/13   Fibroadenoma,microcalcifications  . CESAREAN SECTION  1989  . LUMBAR DISC SURGERY  2009   Dr Maxie Better  . MASTECTOMY COMPLETE / SIMPLE Left 06/09/2013  . MASTECTOMY MODIFIED RADICAL Right 06/09/2013  . MASTECTOMY MODIFIED RADICAL Right 06/09/2013   Procedure: RIGHT MASTECTOMY MODIFIED RADICAL;  Surgeon: Adin Hector, MD;  Location: [facility];  Service: General;  Laterality: Right;  . PORTACATH PLACEMENT Left 01/2013  . SIMPLE MASTECTOMY WITH AXILLARY SENTINEL NODE BIOPSY Left 06/09/2013   Procedure: LEFT SIMPLE MASTECTOMY;  Surgeon: Adin Hector, MD;  Location: [facility];  Service: General;  Laterality: Left;  . TUBAL LIGATION  1991       HPI  from the history and physical done  on the day of admission:     HPI: Jasmine Schwartz is a 51 y.o. female with history of diabetes mellitus type 1 on insulin pump, hypothyroidism breast cancer in remission was brought to the ER after patient had a brief episode of loss of consciousness.  Last night around 11 PM patient had gone to the bathroom to move her bowels and while on the commode patient was straining to move her bowels when next thing she remembers she was on the floor being awoken by her husband.  Husband who heard the sound and immediately went to check on her and found the patient was on the floor staring and having generalized shaking.  This lasted about 30 minutes following which patient was confused for a few minutes.  Patient's husband immediately checked the blood sugar was 90.  EMS was called and brought the ER.  ED Course: By the time patient reached ER patient was back to her baseline.  Appears nonfocal.  Denies any headache visual  symptoms or any weakness of the extremities.  Had some nausea.  Patient did not have any incontinence of urine or tongue bite.  CT head was unremarkable MRI brain with and without contrast was unremarkable.  EKG shows normal sinus rhythm with QTC of 473 ms QRS of 105 ms with nonspecific T wave changes.  Patient is being admitted for syncope.    Hospital Course:   1)Syncope- on telemetry monitored unit no significant arrhythmias,  troponins and EKG negative for ACS , check echocardiogram to rule out significant aortic stenosis or other outflow obstruction, and also to evaluate EF and to rule out segmental/Regional wall motion abnormalities. Check carotid artery Dopplers to rule out hemodynamically significant stenosis.  EEG without definite epileptiform findings, CT head and brain MRI without acute findings.   2)DM-no Hypoglycemia noted, A1c was 7.2 couple of weeks ago  3)Hypothyroidism-continue levothyroxine  4)Mood/Anxiety DO-stable, continue Wellbutrin  5)Disposition- at the time of  discharge echocardiogram and carotid artery duplex  Reports are still pending (not available).   Discharge Condition: Stable medically  Follow UP-patient will call back to the results of carotid artery Dopplers and echocardiogram on 07/13/2017   Consults obtained -  Diet and Activity recommendation:  As advised  Discharge Instructions    Discharge Instructions    Call MD for:  difficulty breathing, headache or visual disturbances   Complete by:  As directed    Call MD for:  persistant dizziness or light-headedness   Complete by:  As directed    Call MD for:  persistant nausea and vomiting   Complete by:  As directed    Call MD for:  severe uncontrolled pain   Complete by:  As directed    Call MD for:  temperature >100.4   Complete by:  As directed    Diet Carb Modified   Complete by:  As directed    Discharge instructions   Complete by:  As directed    1)Please call back on 07/13/17 to get results of your echocardiogram and carotid artery Doppler test  2)Follow-up with your primary care doctor within a week for recheck and to review's hospital workup during this admission/visit  3)Resume all your home medications including Insulin pump without any significant changes   Increase activity slowly   Complete by:  As directed         Discharge Medications     Allergies as of 07/12/2017   No Known Allergies     Medication List    TAKE these medications   accu-chek multiclix lancets Use to check blood sugar 5 times per day   ASPIRIN LOW DOSE 81 MG EC tablet Generic drug:  aspirin TAKE 1 TABLET(81 MG) BY MOUTH DAILY   buPROPion 150 MG 24 hr tablet Commonly known as:  WELLBUTRIN XL TAKE 1 TABLET(150 MG) BY MOUTH DAILY   CONTOUR NEXT TEST test strip Generic drug:  glucose blood TEST BLOOD SUGAR FIVE TIMES DAILY   insulin aspart 100 UNIT/ML injection Commonly known as:  NOVOLOG INJECT 60 UNITS DAILY IN PUMP AS DIRECTED   insulin lispro 100 UNIT/ML  injection Commonly known as:  HUMALOG FOR USE IN PUMP TOTAL OF 60 UNITS PER DAILY   insulin pump Soln Inject 1 each into the skin continuous. humalog   levothyroxine 88 MCG tablet Commonly known as:  SYNTHROID, LEVOTHROID Take 1 tablet (88 mcg total) daily before breakfast by mouth.   linaclotide 72 MCG capsule Commonly known as:  LINZESS Take 1 capsule (72 mcg  total) by mouth daily before breakfast.   valACYclovir 1000 MG tablet Commonly known as:  VALTREX Take 1 tablet (1,000 mg total) by mouth 2 (two) times daily.       Major procedures and Radiology Reports - PLEASE review detailed and final reports for all details, in brief -   Ct Head Wo Contrast  Result Date: 07/12/2017 CLINICAL DATA:  Seizure, new, nontraumatic. Patient with history of metastatic breast cancer. EXAM: CT HEAD WITHOUT CONTRAST TECHNIQUE: Contiguous axial images were obtained from the base of the skull through the vertex without intravenous contrast. COMPARISON:  Brain MRI 09/17/2013 FINDINGS: Brain: No intracranial hemorrhage, mass effect, or midline shift. No hydrocephalus. The basilar cisterns are patent. No evidence of territorial infarct or acute ischemia. No extra-axial or intracranial fluid collection. Vascular: No hyperdense vessel or unexpected calcification. Skull: No fracture or focal lesion. Sinuses/Orbits: Mild ethmoid air cell and maxillary sinus mucosal thickening. No fluid levels. Mastoid air cells are clear. Visualized orbits are unremarkable. Other: None. IMPRESSION: 1. No acute intracranial abnormality or explanation for seizure. Given history of metastatic breast cancer, consider MRI with contrast. 2. Mild paranasal sinus inflammation. Electronically Signed   By: Jeb Levering M.D.   On: 07/12/2017 01:44   Mr Jeri Cos And Wo Contrast  Result Date: 07/12/2017 CLINICAL DATA:  Syncopal episode tonight after using bathroom. New onset seizure. History of breast cancer, diabetes. EXAM: MRI HEAD  WITHOUT AND WITH CONTRAST TECHNIQUE: Multiplanar, multiecho pulse sequences of the brain and surrounding structures were obtained without and with intravenous contrast. CONTRAST:  77mL MULTIHANCE GADOBENATE DIMEGLUMINE 529 MG/ML IV SOLN COMPARISON:  CT HEAD July 12, 2017 at 0129 hours and MRI of the head September 27, 2013 FINDINGS: INTRACRANIAL CONTENTS: No reduced diffusion to suggest acute ischemia or hypercellular tumor. No susceptibility artifact to suggest hemorrhage. The ventricles and sulci are normal for patient's age. No suspicious parenchymal signal, masses, mass effect. No abnormal intraparenchymal or extra-axial enhancement. No abnormal extra-axial fluid collections. No extra-axial masses. VASCULAR: Normal major intracranial vascular flow voids present at skull base. SKULL AND UPPER CERVICAL SPINE: No abnormal sellar expansion. No suspicious calvarial bone marrow signal. Craniocervical junction maintained. SINUSES/ORBITS: Mild paranasal sinus mucosal thickening without air-fluid levels. Mastoid air cells are well aerated. The included ocular globes and orbital contents are non-suspicious. OTHER: None. IMPRESSION: Normal MRI of the head with and without contrast. Electronically Signed   By: Elon Alas M.D.   On: 07/12/2017 05:53    Micro Results   No results found for this or any previous visit (from the past 240 hour(s)).     Today   Subjective    Emilea Goga today has no new complaints, husband and mother at bedside, questions answered          Patient has been seen and examined prior to discharge   Objective   Blood pressure 117/60, pulse 86, temperature 98.3 F (36.8 C), resp. rate 14, height 5\' 3"  (1.6 m), weight 74.4 kg (164 lb), SpO2 100 %.   Intake/Output Summary (Last 24 hours) at 07/12/2017 1531 Last data filed at 07/12/2017 0158 Gross per 24 hour  Intake 1133.33 ml  Output -  Net 1133.33 ml    Exam Gen:- Awake  In no apparent distress  HEENT:-  [place].AT,   Neck-Supple Neck,No JVD,  Lungs- mostly clear  CV- S1, S2 normal Abd-  +ve B.Sounds, Abd Soft, No tenderness, insulin pump site is clean dry and intact Extremity/Skin:- Intact peripheral pulses   Neuro-gait  is steady, no new focal deficits Psych-affect is appropriate   Data Review   CBC w Diff:  Lab Results  Component Value Date   WBC 12.6 (H) 07/12/2017   HGB 12.1 07/12/2017   HGB 13.9 01/04/2015   HCT 36.0 07/12/2017   HCT 40.5 01/04/2015   PLT 310 07/12/2017   PLT 352 01/04/2015   LYMPHOPCT 20.5 06/19/2017   LYMPHOPCT 19.4 01/04/2015   MONOPCT 6.2 06/19/2017   MONOPCT 10.0 01/04/2015   EOSPCT 4.0 06/19/2017   EOSPCT 1.0 01/04/2015   BASOPCT 1.1 06/19/2017   BASOPCT 0.3 01/04/2015    CMP:  Lab Results  Component Value Date   NA 134 (L) 07/12/2017   NA 139 01/04/2015   K 4.0 07/12/2017   K 4.2 01/04/2015   CL 103 07/12/2017   CL 99 01/23/2013   CO2 23 07/12/2017   CO2 26 01/04/2015   BUN 16 07/12/2017   BUN 15.3 01/04/2015   CREATININE 0.80 07/12/2017   CREATININE 0.9 01/04/2015   PROT 7.3 06/19/2017   PROT 6.8 01/04/2015   ALBUMIN 3.9 06/19/2017   ALBUMIN 3.5 01/04/2015   BILITOT 0.5 06/19/2017   BILITOT 0.62 01/04/2015   ALKPHOS 71 06/19/2017   ALKPHOS 67 01/04/2015   AST 16 06/19/2017   AST 17 01/04/2015   ALT 13 06/19/2017   ALT 16 01/04/2015  .   Total Discharge time is about 33 minutes  Roxan Hockey M.D on 07/12/2017 at 3:31 PM  [facility]   Office  [phone]  Voice Recognition Viviann Spare dictation system was used to create this note, attempts have been made to correct errors. Please contact the author with questions and/or clarifications.

## 2017-07-12 NOTE — ED Notes (Signed)
Patient transported to CT 

## 2017-07-12 NOTE — ED Notes (Signed)
Pt up to bathroom with tech. PulseOx monitor showed 100%. Pt has no complaints of dizziness.

## 2017-07-12 NOTE — ED Provider Notes (Signed)
[facility] EMERGENCY DEPARTMENT Provider Note   CSN: [id number] Arrival date & time: 07/12/17  0024     History   Chief Complaint Chief Complaint  Patient presents with  . Loss of Consciousness    HPI Jasmine Schwartz is a 51 y.o. female.  Patient from home after syncopal episode.  She is a type I diabetic with insulin pump.  She states everything was normal throughout the day and she went to bed her blood sugar was 130.  When laying in bed she felt the urge to have a bowel movement and rectal pain and remembers trying to get up to go to the bathroom.  Next thing she knows she was on the floor.  Her husband states she was unresponsive with eyes rolled back in her head and stiff upper extremities.  This lasted for about 20-30 seconds.  Afterwards patient was confused and slow to respond.  She is back to baseline now.  Blood sugar on EMS arrival was 90.  She denies any head, neck, back, chest or abdominal pain currently.  No vomiting or diarrhea.  No recent illnesses.  No tongue biting or incontinence.  No history of seizures.  Since this episode she has had persistent nausea but no abdominal pain.  Is not clear whether she use the bathroom or not when this episode occurred.   The history is provided by a relative and the patient.  Loss of Consciousness   Associated symptoms include abdominal pain, dizziness, light-headedness, nausea and weakness. Pertinent negatives include chest pain, congestion, fever, headaches, seizures and vomiting.    Past Medical History:  Diagnosis Date  . Anxiety   . Anxiety and depression 07/06/2011  . Breast cancer ([place]) 01/01/13   /metastatic 1/1 lymph nodes  . Depression   . Diabetes mellitus    IDDM  . Dyslipidemia   . Hx of insertion of insulin pump ~ 2001 to present (06/09/2013)  . Lower extremity edema   . PONV (postoperative nausea and vomiting)   . Type I diabetes mellitus (H[facility])    uses insulin pump  . Venous insufficiency      Patient Active Problem List   Diagnosis Date Noted  . Chest pain 02/15/2016  . Genetic testing 04/28/2015  . Hypothyroidism 04/06/2015  . Screening examination for infectious disease 04/06/2015  . Wellness examination 09/16/2014  . Lymphedema of upper extremity 07/13/2014  . Lower extremity edema 05/27/2013  . Breast cancer of upper-outer quadrant of right female breast ([place]) 01/01/2013  . Shortness of breath 03/31/2012  . Anxiety and depression 07/06/2011  . Encounter for long-term (current) use of other medications 03/23/2011  . Goiter 03/23/2011  . URTICARIA 03/23/2010  . HYPERCHOLESTEROLEMIA 10/25/2008  . LEG PAIN, RIGHT 02/04/2008  . Type 1 diabetes mellitus ([place]) 03/20/2007  . ANXIETY 03/20/2007  . DEPRESSION 03/20/2007    Past Surgical History:  Procedure Laterality Date  . BREAST BIOPSY Right 01/01/13   Invasive mammary ca,metastatic in 1/1 lymph node  . BREAST BIOPSY Left 01/09/13   Fibroadenoma,microcalcifications  . CESAREAN SECTION  1989  . LUMBAR DISC SURGERY  2009   Dr Maxie Better  . MASTECTOMY COMPLETE / SIMPLE Left 06/09/2013  . MASTECTOMY MODIFIED RADICAL Right 06/09/2013  . MASTECTOMY MODIFIED RADICAL Right 06/09/2013   Procedure: RIGHT MASTECTOMY MODIFIED RADICAL;  Surgeon: Adin Hector, MD;  Location: [facility];  Service: General;  Laterality: Right;  . PORTACATH PLACEMENT Left 01/2013  . SIMPLE MASTECTOMY WITH AXILLARY SENTINEL NODE BIOPSY Left 06/09/2013  Procedure: LEFT SIMPLE MASTECTOMY;  Surgeon: Adin Hector, MD;  Location: [facility];  Service: General;  Laterality: Left;  . TUBAL LIGATION  1991    OB History    No data available       Home Medications    Prior to Admission medications   Medication Sig Start Date End Date Taking? Authorizing Provider  ASPIRIN LOW DOSE 81 MG EC tablet TAKE 1 TABLET(81 MG) BY MOUTH DAILY 03/03/17   [name], MD  buPROPion (WELLBUTRIN XL) 150 MG 24 hr tablet TAKE 1 TABLET(150 MG) BY MOUTH DAILY 06/09/17    [name], MD  CONTOUR NEXT TEST test strip TEST BLOOD SUGAR FIVE TIMES DAILY 06/09/17   [name], MD  insulin aspart (NOVOLOG) 100 UNIT/ML injection INJECT 60 UNITS DAILY IN PUMP AS DIRECTED 06/13/17   [name], MD  Insulin Human (INSULIN PUMP) SOLN Inject 1 each into the skin continuous. humalog    [provider]  Insulin Infusion Pump Supplies (MINIMED INFUSION SET-MMT 397) MISC 1 Device by Does not apply route every 3 (three) days.    [provider]  Insulin Infusion Pump Supplies (PARADIGM PUMP RESERVOIR 1.76ML) MISC 1 Device by Does not apply route every 3 (three) days.    [provider]  insulin lispro (HUMALOG) 100 UNIT/ML injection FOR USE IN PUMP TOTAL OF 60 UNITS PER DAILY 06/20/17   [name], MD  Lancets (ACCU-CHEK MULTICLIX) lancets Use to check blood sugar 5 times per day 06/29/15   [name], MD  levothyroxine (SYNTHROID, LEVOTHROID) 88 MCG tablet Take 1 tablet (88 mcg total) daily before breakfast by mouth. 06/19/17   [name], MD  linaclotide [facility]) 72 MCG capsule Take 1 capsule (72 mcg total) by mouth daily before breakfast. 12/11/16   [name], MD  valACYclovir (VALTREX) 1000 MG tablet Take 1 tablet (1,000 mg total) by mouth 2 (two) times daily. 09/18/16   [name], MD    Family History Family History  Problem Relation Age of Onset  . Coronary artery disease Mother   . Congestive Heart Failure Maternal Grandmother   . Brain cancer Maternal Grandfather        dx in his early 37s  . Cancer Neg Hx   . Diabetes Neg Hx     Social History Social History   Tobacco Use  . Smoking status: Never Smoker  . Smokeless tobacco: Never Used  Substance Use Topics  . Alcohol use: No  . Drug use: No     Allergies   Patient has no known allergies.   Review of Systems Review of Systems  Constitutional: Positive for fatigue. Negative for activity change, appetite change and fever.  HENT: Negative for congestion  and rhinorrhea.   Eyes: Negative for visual disturbance.  Respiratory: Negative for cough, chest tightness and shortness of breath.   Cardiovascular: Positive for syncope. Negative for chest pain.  Gastrointestinal: Positive for abdominal pain, nausea and rectal pain. Negative for constipation and vomiting.  Genitourinary: Negative for dysuria, hematuria, vaginal bleeding and vaginal discharge.  Musculoskeletal: Negative for arthralgias and myalgias.  Skin: Negative for rash.  Neurological: Positive for dizziness, syncope, weakness and light-headedness. Negative for seizures, speech difficulty, numbness and headaches.    all other systems are negative except as noted in the HPI and PMH.    Physical Exam Updated Vital Signs Temp 98.3 F (36.8 C)   Ht 5\' 3"  (1.6 m)   Wt 74.4 kg (164 lb)   BMI 29.05  kg/m   Physical Exam  Constitutional: She is oriented to person, place, and time. She appears well-developed and well-nourished. No distress.  HENT:  Head: Normocephalic and atraumatic.  Mouth/Throat: Oropharynx is clear and moist. No oropharyngeal exudate.  No tongue trauma  Eyes: Conjunctivae and EOM are normal. Pupils are equal, round, and reactive to light.  Neck: Normal range of motion. Neck supple.  No meningismus.  Cardiovascular: Normal rate, regular rhythm, normal heart sounds and intact distal pulses.  No murmur heard. Pulmonary/Chest: Effort normal and breath sounds normal. No respiratory distress. She exhibits no tenderness.  Abdominal: Soft. There is no tenderness. There is no rebound and no guarding.  Genitourinary:  Genitourinary Comments: No incontinence  Musculoskeletal: Normal range of motion. She exhibits no edema or tenderness.  Neurological: She is alert and oriented to person, place, and time. No cranial nerve deficit. She exhibits normal muscle tone. Coordination normal.  No ataxia on finger to nose bilaterally. No pronator drift. 5/5 strength throughout. CN 2-12  intact.Equal grip strength. Sensation intact.  Bilateral fatiguable horizontal nystagmus  Skin: Skin is warm. Capillary refill takes less than 2 seconds.  Psychiatric: She has a normal mood and affect. Her behavior is normal.  Nursing note and vitals reviewed.    ED Treatments / Results  Labs (all labs ordered are listed, but only abnormal results are displayed) Labs Reviewed  BASIC METABOLIC PANEL - Abnormal; Notable for the following components:      Result Value   Sodium 133 (*)    Potassium 6.2 (*)    Calcium 8.6 (*)    All other components within normal limits  URINALYSIS, ROUTINE W REFLEX MICROSCOPIC - Abnormal; Notable for the following components:   APPearance HAZY (*)    Glucose, UA 50 (*)    Hgb urine dipstick MODERATE (*)    Leukocytes, UA TRACE (*)    Bacteria, UA MANY (*)    Squamous Epithelial / LPF 6-30 (*)    All other components within normal limits  BASIC METABOLIC PANEL - Abnormal; Notable for the following components:   Sodium 134 (*)    Glucose, Bld 210 (*)    Calcium 8.5 (*)    All other components within normal limits  CBC - Abnormal; Notable for the following components:   WBC 12.6 (*)    All other components within normal limits  CBG MONITORING, ED - Abnormal; Notable for the following components:   Glucose-Capillary 106 (*)    All other components within normal limits  CBG MONITORING, ED - Abnormal; Notable for the following components:   Glucose-Capillary 184 (*)    All other components within normal limits  CBC  RAPID URINE DRUG SCREEN, HOSP PERFORMED  POTASSIUM  TROPONIN I  TROPONIN I  TROPONIN I  CBG MONITORING, ED  I-STAT BETA HCG BLOOD, ED ([facility], [facility], [facility] ONLY)  I-STAT BETA HCG BLOOD, ED ([facility], [facility], [facility] ONLY)    EKG  EKG Interpretation  Date/Time:  Friday July 12 2017 00:27:44 EST Ventricular Rate:  89 PR Interval:    QRS Duration: 105 QT Interval:  388 QTC Calculation: 473 R Axis:   73 Text Interpretation:  Sinus rhythm  Borderline T abnormalities, anterior leads No significant change was found Confirmed by Ezequiel Essex [id number]) on 07/12/2017 12:31:31 AM       Radiology Ct Head Wo Contrast  Result Date: 07/12/2017 CLINICAL DATA:  Seizure, new, nontraumatic. Patient with history of metastatic breast cancer. EXAM: CT HEAD WITHOUT CONTRAST TECHNIQUE: Contiguous  axial images were obtained from the base of the skull through the vertex without intravenous contrast. COMPARISON:  Brain MRI 09/17/2013 FINDINGS: Brain: No intracranial hemorrhage, mass effect, or midline shift. No hydrocephalus. The basilar cisterns are patent. No evidence of territorial infarct or acute ischemia. No extra-axial or intracranial fluid collection. Vascular: No hyperdense vessel or unexpected calcification. Skull: No fracture or focal lesion. Sinuses/Orbits: Mild ethmoid air cell and maxillary sinus mucosal thickening. No fluid levels. Mastoid air cells are clear. Visualized orbits are unremarkable. Other: None. IMPRESSION: 1. No acute intracranial abnormality or explanation for seizure. Given history of metastatic breast cancer, consider MRI with contrast. 2. Mild paranasal sinus inflammation. Electronically Signed   By: Jeb Levering M.D.   On: 07/12/2017 01:44    Procedures Procedures (including critical care time)  Medications Ordered in ED Medications  ondansetron (ZOFRAN) injection 4 mg (not administered)  sodium chloride 0.9 % bolus 1,000 mL (not administered)     Initial Impression / Assessment and Plan / ED Course  I have reviewed the triage vital signs and the nursing notes.  Pertinent labs & imaging results that were available during my care of the patient were reviewed by me and considered in my medical decision making (see chart for details).    Patient from home with episode of syncope versus seizure.  No reported episodes of hypoglycemia.  EKG is unchanged.  No Brugada, no prolonged QT  Patient given IV fluids  and labs obtained.  Orthostatics obtained.  Heart rate lying 100, heart rate standing 111.  Patient denies any chest pain or abdominal pain. CT head is reassuring though given patient's history of breast cancer, may need MRI to rule out metastasis and possible new onset seizure.   Patient is nearing back to baseline.  She denies any pain.  She is not certain what happened.  She will be given  additional IV fluids.  MRI pending to evaluate for metastatic lesion.  Suspect this is likely new onset seizure versus syncope.  Observation admission discussed with Dr. Hal Hope Final Clinical Impressions(s) / ED Diagnoses   Final diagnoses:  Loss of consciousness [facility])  Syncope, unspecified syncope type    ED Discharge Orders    None       Jaykub Mackins, Annie Main, MD 07/12/17 [phone]

## 2017-07-12 NOTE — ED Notes (Signed)
Pt's CBG 285.  Informed Larkin Ina, Therapist, sports.

## 2017-07-12 NOTE — Procedures (Signed)
ELECTROENCEPHALOGRAM REPORT  Date of Study: 07/12/2017  Patient's Name: Jasmine Schwartz MRN: [id number] Date of Birth: 1966/07/01  Referring Provider: Gean Birchwood, MD  Clinical History: 51 y.o. female with history of diabetes mellitus type 1 on insulin pump, hypothyroidism breast cancer in remission was brought to the ER after patient had a brief episode of loss of consciousness.  Medications: acetaminophen (TYLENOL)   aspirin EC tablet 81 mg  buPROPion (WELLBUTRIN XL) enoxaparin (LOVENOX)  insulin pump  levothyroxine (SYNTHROID, LEVOTHROID) linaclotide (LINZESS)   ondansetron (ZOFRAN)  Technical Summary: A multichannel digital EEG recording measured by the international 10-20 system with electrodes applied with paste and impedances below 5000 ohms performed in our laboratory with EKG monitoring in an awake and drowsy patient.  Hyperventilation and photic stimulation were performed.  The digital EEG was referentially recorded, reformatted, and digitally filtered in a variety of bipolar and referential montages for optimal display.    Description: The patient is awake and drowsy during the recording.  During maximal wakefulness, there is a symmetric, medium voltage 12 Hz posterior dominant rhythm that attenuates with eye opening.  The record is symmetric.  During drowsiness, there is an increase in theta slowing of the background.  Stage 2 sleep was not seen.  Hyperventilation and photic stimulation did not elicit any abnormalities.  There were no epileptiform discharges or electrographic seizures seen.    EKG lead was unremarkable.  Impression: This awake and drowsy EEG is normal.    Clinical Correlation: A normal EEG does not exclude a clinical diagnosis of epilepsy.  If further clinical questions remain, prolonged EEG may be helpful.  Clinical correlation is advised.   Metta Clines, DO

## 2017-07-12 NOTE — ED Triage Notes (Signed)
Pt. Had syncopal episode tonight. Pt. Reports having an upset stomach and went to the bathroom. Pt. Remember waking up on the floor with husband trying to help her up.   CBG 68- given glucose tablet but unable to keep it down. Pt. Also given 4 of Zofran.

## 2017-07-12 NOTE — H&P (Addendum)
History and Physical    Jasmine Schwartz JSH:[id number] DOB: 09/22/1965 DOA: 07/12/2017  PCP: [name], MD  Patient coming from: Home.  Chief Complaint: Loss of consciousness.  HPI: Jasmine Schwartz is a 51 y.o. female with history of diabetes mellitus type 1 on insulin pump, hypothyroidism breast cancer in remission was brought to the ER after patient had a brief episode of loss of consciousness.  Last night around 11 PM patient had gone to the bathroom to move her bowels and while on the commode patient was straining to move her bowels when next thing she remembers she was on the floor being awoken by her husband.  Husband who heard the sound and immediately went to check on her and found the patient was on the floor staring and having generalized shaking.  This lasted about 30 minutes following which patient was confused for a few minutes.  Patient's husband immediately checked the blood sugar was 90.  EMS was called and brought the ER.  ED Course: By the time patient reached ER patient was back to her baseline.  Appears nonfocal.  Denies any headache visual symptoms or any weakness of the extremities.  Had some nausea.  Patient did not have any incontinence of urine or tongue bite.  CT head was unremarkable MRI brain with and without contrast was unremarkable.  EKG shows normal sinus rhythm with QTC of 473 ms QRS of 105 ms with nonspecific T wave changes.  Patient is being admitted for syncope.  Review of Systems: As per HPI, rest all negative.   Past Medical History:  Diagnosis Date  . Anxiety   . Anxiety and depression 07/06/2011  . Breast cancer ([place]) 01/01/13   /metastatic 1/1 lymph nodes  . Depression   . Diabetes mellitus    IDDM  . Dyslipidemia   . Hx of insertion of insulin pump ~ 2001 to present (06/09/2013)  . Lower extremity edema   . PONV (postoperative nausea and vomiting)   . Type I diabetes mellitus (HCC)    uses insulin pump  . Venous insufficiency     Past Surgical  History:  Procedure Laterality Date  . BREAST BIOPSY Right 01/01/13   Invasive mammary ca,metastatic in 1/1 lymph node  . BREAST BIOPSY Left 01/09/13   Fibroadenoma,microcalcifications  . CESAREAN SECTION  1989  . LUMBAR DISC SURGERY  2009   Dr Maxie Better  . MASTECTOMY COMPLETE / SIMPLE Left 06/09/2013  . MASTECTOMY MODIFIED RADICAL Right 06/09/2013  . MASTECTOMY MODIFIED RADICAL Right 06/09/2013   Procedure: RIGHT MASTECTOMY MODIFIED RADICAL;  Surgeon: Adin Hector, MD;  Location: [place];  Service: General;  Laterality: Right;  . PORTACATH PLACEMENT Left 01/2013  . SIMPLE MASTECTOMY WITH AXILLARY SENTINEL NODE BIOPSY Left 06/09/2013   Procedure: LEFT SIMPLE MASTECTOMY;  Surgeon: Adin Hector, MD;  Location: [place];  Service: General;  Laterality: Left;  . TUBAL LIGATION  1991     reports that  has never smoked. she has never used smokeless tobacco. She reports that she does not drink alcohol or use drugs.  No Known Allergies  Family History  Problem Relation Age of Onset  . Coronary artery disease Mother   . Congestive Heart Failure Maternal Grandmother   . Brain cancer Maternal Grandfather        dx in his early 82s  . Cancer Neg Hx   . Diabetes Neg Hx     Prior to Admission medications   Medication Sig Start Date End Date  Taking? Authorizing Provider  ASPIRIN LOW DOSE 81 MG EC tablet TAKE 1 TABLET(81 MG) BY MOUTH DAILY 03/03/17  Yes [name], MD  buPROPion (WELLBUTRIN XL) 150 MG 24 hr tablet TAKE 1 TABLET(150 MG) BY MOUTH DAILY 06/09/17  Yes [name], MD  Insulin Human (INSULIN PUMP) SOLN Inject 1 each into the skin continuous. humalog   Yes [provider]  levothyroxine (SYNTHROID, LEVOTHROID) 88 MCG tablet Take 1 tablet (88 mcg total) daily before breakfast by mouth. 06/19/17  Yes [name], MD  linaclotide [facility]) 72 MCG capsule Take 1 capsule (72 mcg total) by mouth daily before breakfast. 12/11/16  Yes [name], MD  CONTOUR NEXT TEST test strip  TEST BLOOD SUGAR FIVE TIMES DAILY 06/09/17   [name], MD  insulin aspart (NOVOLOG) 100 UNIT/ML injection INJECT 60 UNITS DAILY IN PUMP AS DIRECTED Patient not taking: Reported on 07/12/2017 06/13/17   [name], MD  insulin lispro (HUMALOG) 100 UNIT/ML injection FOR USE IN PUMP TOTAL OF 60 UNITS PER DAILY Patient not taking: Reported on 07/12/2017 06/20/17   [name], MD  Lancets (ACCU-CHEK MULTICLIX) lancets Use to check blood sugar 5 times per day 06/29/15   [name], MD  valACYclovir (VALTREX) 1000 MG tablet Take 1 tablet (1,000 mg total) by mouth 2 (two) times daily. Patient not taking: Reported on 07/12/2017 09/18/16   [name], MD    Physical Exam: Vitals:   07/12/17 0130 07/12/17 0200 07/12/17 0230 07/12/17 0300  BP: 132/71 140/79 128/73 139/71  Pulse: 96 96 97 99  Resp: 14 17 10 15   Temp:      SpO2: 99% 99% 98% 98%  Weight:      Height:          Constitutional: Moderately built and nourished. Vitals:   07/12/17 0130 07/12/17 0200 07/12/17 0230 07/12/17 0300  BP: 132/71 140/79 128/73 139/71  Pulse: 96 96 97 99  Resp: 14 17 10 15   Temp:      SpO2: 99% 99% 98% 98%  Weight:      Height:       Eyes: Anicteric no pallor. ENMT: No discharge from the ears nose mouth.  Mild tenderness in the nasal area. Neck: No neck rigidity. Respiratory: No rhonchi or crepitations. Cardiovascular: S1-S2 heard. Abdomen: Soft nontender bowel sounds present. Musculoskeletal: No edema. Skin: No rash. Neurologic: Alert awake oriented to time place and person.  Moves all extremities 5 x 5.  No facial asymmetry tongue is midline pupils are equal and reacting to light. Psychiatric: Appears normal per normal affect.   Labs on Admission: I have personally reviewed following labs and imaging studies  CBC: Recent Labs  Lab 07/12/17 0027  WBC 9.7  HGB 12.5  HCT 36.9  MCV 84.6  PLT 166   Basic Metabolic Panel: Recent Labs  Lab 07/12/17 0027 07/12/17 0153  NA  133*  --   K 6.2* 3.9  CL 102  --   CO2 26  --   GLUCOSE 71  --   BUN 19  --   CREATININE 0.84  --   CALCIUM 8.6*  --    GFR: Estimated Creatinine Clearance: 75.7 mL/min (by C-G formula based on SCr of 0.84 mg/dL). Liver Function Tests: No results for input(s): AST, ALT, ALKPHOS, BILITOT, PROT, ALBUMIN in the last 168 hours. No results for input(s): LIPASE, AMYLASE in the last 168 hours. No results for input(s): AMMONIA in the last 168 hours. Coagulation Profile: No results for input(s): INR,  PROTIME in the last 168 hours. Cardiac Enzymes: No results for input(s): CKTOTAL, CKMB, CKMBINDEX, TROPONINI in the last 168 hours. BNP (last 3 results) No results for input(s): PROBNP in the last 8760 hours. HbA1C: No results for input(s): HGBA1C in the last 72 hours. CBG: Recent Labs  Lab 07/12/17 0028 07/12/17 0058 07/12/17 0300  GLUCAP 68 106* 184*   Lipid Profile: No results for input(s): CHOL, HDL, LDLCALC, TRIG, CHOLHDL, LDLDIRECT in the last 72 hours. Thyroid Function Tests: No results for input(s): TSH, T4TOTAL, FREET4, T3FREE, THYROIDAB in the last 72 hours. Anemia Panel: No results for input(s): VITAMINB12, FOLATE, FERRITIN, TIBC, IRON, RETICCTPCT in the last 72 hours. Urine analysis:    Component Value Date/Time   COLORURINE YELLOW 06/19/2017 [place] 06/19/2017 0841   LABSPEC 1.025 06/19/2017 0841   PHURINE 5.5 06/19/2017 0841   GLUCOSEU 250 (A) 06/19/2017 0841   HGBUR MODERATE (A) 06/19/2017 0841   BILIRUBINUR NEGATIVE 06/19/2017 0841   KETONESUR NEGATIVE 06/19/2017 0841   PROTEINUR NEGATIVE 05/29/2013 0848   UROBILINOGEN 0.2 06/19/2017 0841   NITRITE NEGATIVE 06/19/2017 0841   LEUKOCYTESUR NEGATIVE 06/19/2017 0841   Sepsis Labs: @LABRCNTIP (procalcitonin:4,lacticidven:4) )No results found for this or any previous visit (from the past 240 hour(s)).   Radiological Exams on Admission: Ct Head Wo Contrast  Result Date: 07/12/2017 CLINICAL  DATA:  Seizure, new, nontraumatic. Patient with history of metastatic breast cancer. EXAM: CT HEAD WITHOUT CONTRAST TECHNIQUE: Contiguous axial images were obtained from the base of the skull through the vertex without intravenous contrast. COMPARISON:  Brain MRI 09/17/2013 FINDINGS: Brain: No intracranial hemorrhage, mass effect, or midline shift. No hydrocephalus. The basilar cisterns are patent. No evidence of territorial infarct or acute ischemia. No extra-axial or intracranial fluid collection. Vascular: No hyperdense vessel or unexpected calcification. Skull: No fracture or focal lesion. Sinuses/Orbits: Mild ethmoid air cell and maxillary sinus mucosal thickening. No fluid levels. Mastoid air cells are clear. Visualized orbits are unremarkable. Other: None. IMPRESSION: 1. No acute intracranial abnormality or explanation for seizure. Given history of metastatic breast cancer, consider MRI with contrast. 2. Mild paranasal sinus inflammation. Electronically Signed   By: Jeb Levering M.D.   On: 07/12/2017 01:44    EKG: Independently reviewed.  Normal sinus rhythm QTC of 473 ms and QRS of 105 ms.  Nonspecific T wave changes.  Assessment/Plan Principal Problem:   Syncope Active Problems:   Type 1 diabetes mellitus (HCC)   Hypothyroidism    1. Syncope -cause not clear.  Since patient was straining at the commode patient probably could have had a convulsive syncope due to vasovagal as discussed with neurologist Dr. Leonel Ramsay.  Will monitor in telemetry check 2D echo troponins and ECG. 2. Diabetes mellitus type 1 on insulin pump -no hypoglycemia documented.  Insulin pump continue at this time closely follow CBGs. 3. Hypothyroidism on Synthroid recently Synthroid was increased.  Patient has nasal bone tenderness but does not want any further imaging for this.   DVT prophylaxis: Lovenox. Code Status: Full code. Family Communication: Patient's husband. Disposition Plan: Home. Consults called:  Discussed with neurologist. Admission status: Observation.   Rise Patience MD [facility] Pager [phone].  If 7PM-7AM, please contact night-coverage [email or web address] Password [email or web address]  07/12/2017, 3:38 AM

## 2017-07-12 NOTE — Progress Notes (Signed)
Inpatient Diabetes Program Recommendations  AACE/ADA: New Consensus Statement on Inpatient Glycemic Control (2015)  Target Ranges:  Prepandial:   less than 140 mg/dL      Peak postprandial:   less than 180 mg/dL (1-2 hours)      Critically ill patients:  140 - 180 mg/dL   Lab Results  Component Value Date   GLUCAP 112 (H) 07/12/2017   HGBA1C 7.2 06/19/2017   Review of Glycemic Control  Diabetes history: DM 1, Sess Dr. Loanne Drilling outpatient Outpatient Diabetes medications: Insulin pump Humalog Current orders for Inpatient glycemic control: Insulin pump order set  Basal rates 12a-2a   1.1 units/hour 2a-6a     1.9 units/hour 6a-12a   1.1 units/hour  Patient covers 1 units of insulin for every 12 grams of carbs  Patient last saw Dr. Loanne Drilling  On 11/7. He referred her to Dietitian at that time to improver her diet. Gave patient nutritional handouts to prepare her for her session regarding nutrition and spoke briefly about diet choices and carb coverage.  Thanks,  Tama Headings RN, MSN, [facility] Inpatient Diabetes Coordinator Team Pager [phone] (8a-5p)

## 2017-07-12 NOTE — ED Notes (Signed)
Pt stated she had her insulin pump off for MRI and did not turn it back on. Pt turned on insulin pump and gave herself a bolus.

## 2017-07-12 NOTE — Progress Notes (Signed)
EEG complete - results pending 

## 2017-07-12 NOTE — ED Notes (Signed)
Pt return from MRI via stretcher.

## 2017-07-12 NOTE — ED Notes (Signed)
Ambulated pt to the bathroom on the pulse ox.  SpO2 remained 100% the entire time.

## 2017-07-12 NOTE — ED Notes (Signed)
Pt. To MRI via stretcher. 

## 2017-07-12 NOTE — Progress Notes (Signed)
*  PRELIMINARY RESULTS* Echocardiogram 2D Echocardiogram has been performed.  Jasmine Schwartz 07/12/2017, 3:25 PM

## 2017-07-14 ENCOUNTER — Other Ambulatory Visit: Payer: Self-pay | Admitting: Endocrinology

## 2017-07-27 DIAGNOSIS — R3915 Urgency of urination: Secondary | ICD-10-CM | POA: Diagnosis not present

## 2017-08-02 ENCOUNTER — Other Ambulatory Visit: Payer: Self-pay

## 2017-08-02 MED ORDER — LINACLOTIDE 72 MCG PO CAPS: 72.0000 ug | ORAL_CAPSULE | Freq: Every day | ORAL | 11 refills | Status: DC

## 2017-08-04 ENCOUNTER — Other Ambulatory Visit: Payer: Self-pay | Admitting: Endocrinology

## 2017-08-05 ENCOUNTER — Other Ambulatory Visit: Payer: Self-pay

## 2017-08-05 MED ORDER — INSULIN LISPRO 100 UNIT/ML ~~LOC~~ SOLN: SUBCUTANEOUS | 4 refills | Status: DC

## 2017-08-21 ENCOUNTER — Telehealth: Payer: Self-pay | Admitting: Endocrinology

## 2017-08-21 ENCOUNTER — Other Ambulatory Visit: Payer: Self-pay

## 2017-08-21 NOTE — Telephone Encounter (Signed)
Pt stated insurance is needing an approval for the   linaclotide (LINZESS) 72 MCG capsule  Pt states insurance will not cover unless the dr says she needs this medication  Pt is not sure if we need to send it to her insurance or not   Please advise

## 2017-08-30 NOTE — Telephone Encounter (Signed)
I tried submit PA but said insurance was wrong or patient not found. I'm sure we don't have updated prescription card. Should I call patient to get her new insurance info or switch patient to alternative to linzess?

## 2017-09-01 NOTE — Telephone Encounter (Signed)
I need to know what alternative is, thank you.

## 2017-09-09 ENCOUNTER — Ambulatory Visit: Payer: 59 | Admitting: Nutrition

## 2017-09-09 DIAGNOSIS — L300 Nummular dermatitis: Secondary | ICD-10-CM | POA: Diagnosis not present

## 2017-09-11 NOTE — Telephone Encounter (Signed)
Called pt and it has been worked out with pharmacy and she is able to get Linzess. No further action required

## 2017-09-19 ENCOUNTER — Ambulatory Visit: Payer: 59 | Admitting: Endocrinology

## 2017-09-19 DIAGNOSIS — E038 Other specified hypothyroidism: Secondary | ICD-10-CM

## 2017-09-19 LAB — TSH: TSH: 7.91 u[IU]/mL — AB (ref 0.35–4.50)

## 2017-09-19 MED ORDER — LEVOTHYROXINE SODIUM 100 MCG PO TABS: 100.0000 ug | ORAL_TABLET | Freq: Every day | ORAL | 3 refills | Status: DC

## 2017-09-19 NOTE — Progress Notes (Signed)
Subjective:    Patient ID: Jasmine Schwartz, female    DOB: [birth year], 52 y.o.   MRN: [id number]  HPI Pt returns for f/u of diabetes mellitus:  DM type: 1 Dx'ed: 3154 Complications: none.  Therapy: insulin since dx DKA: never Severe hypoglycemia: never Pancreatitis: never Other: she has been on pump rx since 2004; she declined continuous glucose monitor, due to cost; she has finished rx for her breast cancer, for now.  Interval history: she takes these pump settings:  basal rate of 1.1 units/hr, except 1.9 units/hr, 2AM-6AM.   mealtime bolus of 1 unit/ 12 grams carbohydrate.  correction bolus (which some people call "sensitivity," or "insulin sensitivity ratio," or just "isr") of 1 unit for each 50 by which your glucose exceeds 100).   She takes a total of approx 50 units/day.  no cbg record, but states cbg's vary from 59-355.  It is usually highest fasting.  pt states she feels well in general.   Past Medical History:  Diagnosis Date  . Anxiety   . Anxiety and depression 07/06/2011  . Breast cancer ([place]) 01/01/13   /metastatic 1/1 lymph nodes  . Depression   . Diabetes mellitus    IDDM  . Dyslipidemia   . Hx of insertion of insulin pump ~ 2001 to present (06/09/2013)  . Lower extremity edema   . PONV (postoperative nausea and vomiting)   . Type I diabetes mellitus (HCC)    uses insulin pump  . Venous insufficiency     Past Surgical History:  Procedure Laterality Date  . BREAST BIOPSY Right 01/01/13   Invasive mammary ca,metastatic in 1/1 lymph node  . BREAST BIOPSY Left 01/09/13   Fibroadenoma,microcalcifications  . CESAREAN SECTION  1989  . LUMBAR DISC SURGERY  2009   Dr Maxie Better  . MASTECTOMY COMPLETE / SIMPLE Left 06/09/2013  . MASTECTOMY MODIFIED RADICAL Right 06/09/2013  . MASTECTOMY MODIFIED RADICAL Right 06/09/2013   Procedure: RIGHT MASTECTOMY MODIFIED RADICAL;  Surgeon: Adin Hector, MD;  Location: [place];  Service: General;  Laterality: Right;  . PORTACATH  PLACEMENT Left 01/2013  . SIMPLE MASTECTOMY WITH AXILLARY SENTINEL NODE BIOPSY Left 06/09/2013   Procedure: LEFT SIMPLE MASTECTOMY;  Surgeon: Adin Hector, MD;  Location: [place];  Service: General;  Laterality: Left;  . TUBAL LIGATION  1991    Social History   Socioeconomic History  . Marital status: Married    Spouse name: Not on file  . Number of children: 2  . Years of education: Not on file  . Highest education level: Not on file  Social Needs  . Financial resource strain: Not on file  . Food insecurity - worry: Not on file  . Food insecurity - inability: Not on file  . Transportation needs - medical: Not on file  . Transportation needs - non-medical: Not on file  Occupational History    Employer: [employer]   Tobacco Use  . Smoking status: Never Smoker  . Smokeless tobacco: Never Used  Substance and Sexual Activity  . Alcohol use: No  . Drug use: No  . Sexual activity: Yes  Other Topics Concern  . Not on file  Social History Narrative  . Not on file    Current Outpatient Medications on File Prior to Visit  Medication Sig Dispense Refill  . ASPIRIN LOW DOSE 81 MG EC tablet TAKE 1 TABLET(81 MG) BY MOUTH DAILY 30 tablet 0  . buPROPion (WELLBUTRIN XL) 150 MG 24 hr tablet TAKE 1  TABLET(150 MG) BY MOUTH DAILY 30 tablet 0  . CONTOUR NEXT TEST test strip TEST BLOOD SUGAR FIVE TIMES DAILY 150 each 11  . insulin aspart (NOVOLOG) 100 UNIT/ML injection INJECT 60 UNITS DAILY IN PUMP AS DIRECTED (Patient not taking: Reported on 07/12/2017) 20 mL 2  . Insulin Human (INSULIN PUMP) SOLN Inject 1 each into the skin continuous. humalog    . insulin lispro (HUMALOG) 100 UNIT/ML injection FOR USE IN PUMP TOTAL OF 60 UNITS PER DAY 20 mL 4  . Lancets (ACCU-CHEK MULTICLIX) lancets Use to check blood sugar 5 times per day 200 each 2  . linaclotide (LINZESS) 72 MCG capsule Take 1 capsule (72 mcg total) by mouth daily before breakfast. 30 capsule 11  . valACYclovir (VALTREX) 1000 MG  tablet Take 1 tablet (1,000 mg total) by mouth 2 (two) times daily. (Patient not taking: Reported on 07/12/2017) 20 tablet 0   No current facility-administered medications on file prior to visit.     No Known Allergies  Family History  Problem Relation Age of Onset  . Coronary artery disease Mother   . Congestive Heart Failure Maternal Grandmother   . Brain cancer Maternal Grandfather        dx in his early 12s  . Cancer Neg Hx   . Diabetes Neg Hx     There were no vitals taken for this visit.   Review of Systems She denies hypoglycemia    Objective:   Physical Exam GENERAL: no distress. Pulses: foot pulses are intact bilaterally.   MSK: no deformity of the feet or ankles.  CV: no edema of the legs or ankles Skin:  no ulcer on the feet or ankles.  normal color and temp on the feet and ankles Neuro: sensation is intact to touch on the feet and ankles.   A1c=8.0%    Assessment & Plan:  Type 1 DM: she needs increased rx.   Patient Instructions  Please take these settings:   basal rate of 1.1 units/hr, except 2.2 units/hr, 2AM-6AM.   mealtime bolus of 1 unit/ 12 grams carbohydrate, except subtract 1 unit from calculated supper bolus.   correction bolus (which some people call "sensitivity," or "insulin sensitivity ratio," or just "isr") of 1 unit for each 50 by which your glucose exceeds 100).  blood tests are requested for you today.  We'll let you know about the results.  Please come back for a regular physical appointment in 2 months.

## 2017-09-19 NOTE — Patient Instructions (Addendum)
Please take these settings:   basal rate of 1.1 units/hr, except 2.2 units/hr, 2AM-6AM.   mealtime bolus of 1 unit/ 12 grams carbohydrate, except subtract 1 unit from calculated supper bolus.   correction bolus (which some people call "sensitivity," or "insulin sensitivity ratio," or just "isr") of 1 unit for each 50 by which your glucose exceeds 100).  blood tests are requested for you today.  We'll let you know about the results.  Please come back for a regular physical appointment in 2 months.

## 2017-09-20 ENCOUNTER — Telehealth: Payer: Self-pay | Admitting: Endocrinology

## 2017-09-20 NOTE — Telephone Encounter (Signed)
Patient requests Nurse call her to discuss yesterday's visit-has questions ph# [phone]

## 2017-09-23 NOTE — Telephone Encounter (Signed)
LVM for patient to call back regarding questions about last visit.

## 2017-09-24 NOTE — Telephone Encounter (Signed)
Pt is returning your call from yesterday °

## 2017-09-25 ENCOUNTER — Other Ambulatory Visit: Payer: Self-pay

## 2017-09-25 MED ORDER — FREESTYLE LIBRE 14 DAY SENSOR MISC: 1.0000 | Freq: Every day | 11 refills | Status: DC

## 2017-09-25 NOTE — Telephone Encounter (Signed)
Pt is return

## 2017-09-25 NOTE — Telephone Encounter (Signed)
I spoke with patient & she was calling in regards to freestyle libre. I have sent prescription to her pharmacy.

## 2017-10-08 ENCOUNTER — Ambulatory Visit: Payer: 59 | Admitting: Endocrinology

## 2017-10-08 ENCOUNTER — Encounter: Payer: 59 | Attending: Endocrinology | Admitting: Nutrition

## 2017-10-08 ENCOUNTER — Encounter: Payer: Self-pay | Admitting: Endocrinology

## 2017-10-08 DIAGNOSIS — R05 Cough: Secondary | ICD-10-CM | POA: Diagnosis not present

## 2017-10-08 DIAGNOSIS — E1042 Type 1 diabetes mellitus with diabetic polyneuropathy: Secondary | ICD-10-CM | POA: Diagnosis not present

## 2017-10-08 DIAGNOSIS — E1065 Type 1 diabetes mellitus with hyperglycemia: Secondary | ICD-10-CM

## 2017-10-08 DIAGNOSIS — Z713 Dietary counseling and surveillance: Secondary | ICD-10-CM | POA: Insufficient documentation

## 2017-10-08 DIAGNOSIS — R059 Cough, unspecified: Secondary | ICD-10-CM

## 2017-10-08 MED ORDER — PROMETHAZINE-CODEINE 6.25-10 MG/5ML PO SYRP: 5.0000 mL | ORAL_SOLUTION | ORAL | 0 refills | Status: DC | PRN

## 2017-10-08 MED ORDER — AZITHROMYCIN 500 MG PO TABS: 500.0000 mg | ORAL_TABLET | Freq: Every day | ORAL | 0 refills | Status: DC

## 2017-10-08 NOTE — Progress Notes (Signed)
Subjective:    Patient ID: Jasmine Schwartz, female    DOB: Oct 28, 1965, 52 y.o.   MRN: [id number]  HPI Pt states 6 days of moderate prod-quality cough in the chest, but no assoc wheezing.  Past Medical History:  Diagnosis Date  . Anxiety   . Anxiety and depression 07/06/2011  . Breast cancer ([place]) 01/01/13   /metastatic 1/1 lymph nodes  . Depression   . Diabetes mellitus    IDDM  . Dyslipidemia   . Hx of insertion of insulin pump ~ 2001 to present (06/09/2013)  . Lower extremity edema   . PONV (postoperative nausea and vomiting)   . Type I diabetes mellitus (HCC)    uses insulin pump  . Venous insufficiency     Past Surgical History:  Procedure Laterality Date  . BREAST BIOPSY Right 01/01/13   Invasive mammary ca,metastatic in 1/1 lymph node  . BREAST BIOPSY Left 01/09/13   Fibroadenoma,microcalcifications  . CESAREAN SECTION  1989  . LUMBAR DISC SURGERY  2009   Dr Maxie Better  . MASTECTOMY COMPLETE / SIMPLE Left 06/09/2013  . MASTECTOMY MODIFIED RADICAL Right 06/09/2013  . MASTECTOMY MODIFIED RADICAL Right 06/09/2013   Procedure: RIGHT MASTECTOMY MODIFIED RADICAL;  Surgeon: Adin Hector, MD;  Location: [place];  Service: General;  Laterality: Right;  . PORTACATH PLACEMENT Left 01/2013  . SIMPLE MASTECTOMY WITH AXILLARY SENTINEL NODE BIOPSY Left 06/09/2013   Procedure: LEFT SIMPLE MASTECTOMY;  Surgeon: Adin Hector, MD;  Location: [place];  Service: General;  Laterality: Left;  . TUBAL LIGATION  1991    Social History   Socioeconomic History  . Marital status: Married    Spouse name: Not on file  . Number of children: 2  . Years of education: Not on file  . Highest education level: Not on file  Social Needs  . Financial resource strain: Not on file  . Food insecurity - worry: Not on file  . Food insecurity - inability: Not on file  . Transportation needs - medical: Not on file  . Transportation needs - non-medical: Not on file  Occupational History    Employer: [employer]   Tobacco Use  . Smoking status: Never Smoker  . Smokeless tobacco: Never Used  Substance and Sexual Activity  . Alcohol use: No  . Drug use: No  . Sexual activity: Yes  Other Topics Concern  . Not on file  Social History Narrative  . Not on file    Current Outpatient Medications on File Prior to Visit  Medication Sig Dispense Refill  . ASPIRIN LOW DOSE 81 MG EC tablet TAKE 1 TABLET(81 MG) BY MOUTH DAILY 30 tablet 0  . buPROPion (WELLBUTRIN XL) 150 MG 24 hr tablet TAKE 1 TABLET(150 MG) BY MOUTH DAILY 30 tablet 0  . Continuous Blood Gluc Sensor (FREESTYLE LIBRE 14 DAY SENSOR) MISC 1 Device by Does not apply route daily. Use one device every 14 days to check blood sugars. DX code E10.9. 3 each 11  . CONTOUR NEXT TEST test strip TEST BLOOD SUGAR FIVE TIMES DAILY 150 each 11  . insulin aspart (NOVOLOG) 100 UNIT/ML injection INJECT 60 UNITS DAILY IN PUMP AS DIRECTED 20 mL 2  . Insulin Human (INSULIN PUMP) SOLN Inject 1 each into the skin continuous. humalog    . insulin lispro (HUMALOG) 100 UNIT/ML injection FOR USE IN PUMP TOTAL OF 60 UNITS PER DAY 20 mL 4  . Lancets (ACCU-CHEK MULTICLIX) lancets Use to check blood sugar 5  times per day 200 each 2  . levothyroxine (SYNTHROID, LEVOTHROID) 100 MCG tablet Take 1 tablet (100 mcg total) by mouth daily before breakfast. 90 tablet 3  . linaclotide (LINZESS) 72 MCG capsule Take 1 capsule (72 mcg total) by mouth daily before breakfast. 30 capsule 11  . valACYclovir (VALTREX) 1000 MG tablet Take 1 tablet (1,000 mg total) by mouth 2 (two) times daily. 20 tablet 0   No current facility-administered medications on file prior to visit.     No Known Allergies  Family History  Problem Relation Age of Onset  . Coronary artery disease Mother   . Congestive Heart Failure Maternal Grandmother   . Brain cancer Maternal Grandfather        dx in his early 39s  . Cancer Neg Hx   . Diabetes Neg Hx     BP 122/80   Pulse 95   Temp 98.5 F  (36.9 C)   Ht 5\' 3"  (1.6 m)   Wt 162 lb (73.5 kg)   SpO2 96%   BMI 28.70 kg/m    Review of Systems Denies sob, nasal congestion, and fever.     Objective:   Physical Exam VITAL SIGNS:  See vs page GENERAL: no distress LUNGS:  Clear to auscultation.       Assessment & Plan:  Acute bronchitis, new  Patient Instructions  I have sent 2 prescription to your pharmacy: antibiotic and cough syrup. I hope you feel better soon.  If you don't feel better by next week, please call back.  Please call sooner if you get worse.

## 2017-10-08 NOTE — Patient Instructions (Addendum)
I have sent 2 prescription to your pharmacy: antibiotic and cough syrup. I hope you feel better soon.  If you don't feel better by next week, please call back.  Please call sooner if you get worse.

## 2017-10-09 ENCOUNTER — Telehealth: Payer: Self-pay | Admitting: Nutrition

## 2017-10-09 DIAGNOSIS — R059 Cough, unspecified: Secondary | ICD-10-CM | POA: Insufficient documentation

## 2017-10-09 DIAGNOSIS — R05 Cough: Secondary | ICD-10-CM | POA: Insufficient documentation

## 2017-10-09 NOTE — Progress Notes (Signed)
Patient is here with mother, and complains that her blood sugars are "all over the place.  She is wearing a Libre CGM and does not know the difference between the sensor readings and her blood sugar.  She reports that she is using the sensor readings to calculate a correction dose, as well as a meal time insulin dose. We discussed when it would be appropriate to test blood sugars and she reported good understanding of this.  We discussed the difference between the readings and the blood sugars.  She reported good understanding of this.  She also did not understand the concept of IOB, and this was explained to her.  We also reviewed low blood sugars and and her treatment of this.  She appears to be having a lot of high blood sugar rebounds when she drinks only 1/2 cup of 2% milk.  I explained the idea of the liver releasing sugar, and the fact that she should not keep giving insulin for these high readings, because she will drop low because of stacking, and because the sugar goes back into the liver without insulin.  She reported good understanding of this.   Her CGM readings are going to her phone, and we tried to get her set up on the Internet with [employer].  It said her gmail was already in use, and she was told to call the [employer] hot line to get this set up/fixed.   She clearly has a blood sugar rise at 9AM without food.  She was told to show  Dr. Loanne Drilling this when she sees him after seeing me. She agreed to do this.   I believe she needs a lot of education and we discussed maybe a review of carb counting and low blood sugar treatment at another visit. I believe she would benefit from a pump that shows her IOB so she will not be stacking insulin.  She was encouraged to use the bolus wizard when making insulin adjustments for her high blood sugar readings.

## 2017-10-09 NOTE — Patient Instructions (Signed)
Always test blood sugar before bolusing, when you see an arrow on the screen, or blood sugars are over 200. Always use the bolus wizard when making high blood sugar correction doses.

## 2017-10-09 NOTE — Telephone Encounter (Signed)
Patient was called and asked if her [employer] account got set up.  She said no, and that she never received a password update from them.  She was told to call the help line at [phone].  She said she would do this today.

## 2017-10-14 DIAGNOSIS — R5383 Other fatigue: Secondary | ICD-10-CM | POA: Diagnosis not present

## 2017-10-14 DIAGNOSIS — J209 Acute bronchitis, unspecified: Secondary | ICD-10-CM | POA: Diagnosis not present

## 2017-10-20 DIAGNOSIS — J218 Acute bronchiolitis due to other specified organisms: Secondary | ICD-10-CM | POA: Diagnosis not present

## 2017-11-03 DIAGNOSIS — M26629 Arthralgia of temporomandibular joint, unspecified side: Secondary | ICD-10-CM | POA: Diagnosis not present

## 2017-11-03 DIAGNOSIS — R21 Rash and other nonspecific skin eruption: Secondary | ICD-10-CM | POA: Diagnosis not present

## 2017-11-06 ENCOUNTER — Ambulatory Visit: Payer: 59 | Admitting: Endocrinology

## 2017-11-06 ENCOUNTER — Encounter: Payer: Self-pay | Admitting: Endocrinology

## 2017-11-06 ENCOUNTER — Other Ambulatory Visit (INDEPENDENT_AMBULATORY_CARE_PROVIDER_SITE_OTHER): Payer: 59

## 2017-11-06 VITALS — BP 118/70 | HR 98 | Wt 159.0 lb

## 2017-11-06 DIAGNOSIS — E038 Other specified hypothyroidism: Secondary | ICD-10-CM

## 2017-11-06 DIAGNOSIS — Z119 Encounter for screening for infectious and parasitic diseases, unspecified: Secondary | ICD-10-CM

## 2017-11-06 DIAGNOSIS — E1042 Type 1 diabetes mellitus with diabetic polyneuropathy: Secondary | ICD-10-CM

## 2017-11-06 DIAGNOSIS — M791 Myalgia, unspecified site: Secondary | ICD-10-CM

## 2017-11-06 LAB — BASIC METABOLIC PANEL
BUN: 15 mg/dL (ref 6–23)
CO2: 25 mEq/L (ref 19–32)
Calcium: 8.3 mg/dL — ABNORMAL LOW (ref 8.4–10.5)
Chloride: 100 mEq/L (ref 96–112)
Creatinine, Ser: 0.75 mg/dL (ref 0.40–1.20)
GFR: 86.05 mL/min (ref >60.00)
Glucose, Bld: 234 mg/dL — ABNORMAL HIGH (ref 70–99)
POTASSIUM: 3.7 meq/L (ref 3.5–5.1)
Sodium: 135 mEq/L (ref 135–145)

## 2017-11-06 LAB — HEPATIC FUNCTION PANEL
ALBUMIN: 3.2 g/dL — AB (ref 3.5–5.2)
ALT: 28 U/L (ref 0–35)
AST: 43 U/L — ABNORMAL HIGH (ref 0–37)
Alkaline Phosphatase: 78 U/L (ref 39–117)
Bilirubin, Direct: 0.2 mg/dL (ref 0.0–0.3)
Total Bilirubin: 0.7 mg/dL (ref 0.2–1.2)
Total Protein: 6.6 g/dL (ref 6.0–8.3)

## 2017-11-06 LAB — POCT GLYCOSYLATED HEMOGLOBIN (HGB A1C): Hemoglobin A1C: 8.7

## 2017-11-06 LAB — CK: CK TOTAL: 173 U/L (ref 7–177)

## 2017-11-06 LAB — VITAMIN D 25 HYDROXY (VIT D DEFICIENCY, FRACTURES): VITD: 20.54 ng/mL — AB (ref 30.00–100.00)

## 2017-11-06 LAB — SEDIMENTATION RATE: SED RATE: 44 mm/h — AB (ref 0–30)

## 2017-11-06 LAB — TSH: TSH: 4.8 u[IU]/mL — ABNORMAL HIGH (ref 0.35–4.50)

## 2017-11-06 NOTE — Progress Notes (Signed)
Subjective:    Patient ID: Jasmine Schwartz, female    DOB: [birth year], 52 y.o.   MRN: [id number]  HPI Pt returns for f/u of diabetes mellitus:  DM type: 1 Dx'ed: 6295 Complications: none.  Therapy: insulin since dx DKA: never Severe hypoglycemia: never Pancreatitis: never Other: she has been on pump rx since 2004; she declined continuous glucose monitor, due to cost; she has finished rx for her breast cancer, for now.  Interval history: she takes these pump settings:  basal rate of 1.1 units/hr, except 1.9 units/hr, 2AM-6AM.   mealtime bolus of 1 unit/ 12 grams carbohydrate.  correction bolus (which some people call "sensitivity," or "insulin sensitivity ratio," or just "isr") of 1 unit for each 50 by which your glucose exceeds 100).   She takes a total of approx 50 units/day.  no cbg record, but says cbg's are slightly higher since pt was rx'ed prednisone at [facility].  This helped AB, but she developed moderate rash throughout the body, and assoc itching.  She says hypercalcemia was noted at [facility].  I asked pt, and she is certain it was not hypocalcemia, which was noted in 2018 Past Medical History:  Diagnosis Date  . Anxiety   . Anxiety and depression 07/06/2011  . Breast cancer ([place]) 01/01/13   /metastatic 1/1 lymph nodes  . Depression   . Diabetes mellitus    IDDM  . Dyslipidemia   . Hx of insertion of insulin pump ~ 2001 to present (06/09/2013)  . Lower extremity edema   . PONV (postoperative nausea and vomiting)   . Type I diabetes mellitus (HCC)    uses insulin pump  . Venous insufficiency     Past Surgical History:  Procedure Laterality Date  . BREAST BIOPSY Right 01/01/13   Invasive mammary ca,metastatic in 1/1 lymph node  . BREAST BIOPSY Left 01/09/13   Fibroadenoma,microcalcifications  . CESAREAN SECTION  1989  . LUMBAR DISC SURGERY  2009   Dr Maxie Better  . MASTECTOMY COMPLETE / SIMPLE Left 06/09/2013  . MASTECTOMY MODIFIED RADICAL Right 06/09/2013  . MASTECTOMY MODIFIED  RADICAL Right 06/09/2013   Procedure: RIGHT MASTECTOMY MODIFIED RADICAL;  Surgeon: Adin Hector, MD;  Location: [place];  Service: General;  Laterality: Right;  . PORTACATH PLACEMENT Left 01/2013  . SIMPLE MASTECTOMY WITH AXILLARY SENTINEL NODE BIOPSY Left 06/09/2013   Procedure: LEFT SIMPLE MASTECTOMY;  Surgeon: Adin Hector, MD;  Location: [place];  Service: General;  Laterality: Left;  . TUBAL LIGATION  1991    Social History   Socioeconomic History  . Marital status: Married    Spouse name: Not on file  . Number of children: 2  . Years of education: Not on file  . Highest education level: Not on file  Occupational History    Employer: [employer]   Social Needs  . Financial resource strain: Not on file  . Food insecurity:    Worry: Not on file    Inability: Not on file  . Transportation needs:    Medical: Not on file    Non-medical: Not on file  Tobacco Use  . Smoking status: Never Smoker  . Smokeless tobacco: Never Used  Substance and Sexual Activity  . Alcohol use: No  . Drug use: No  . Sexual activity: Yes  Lifestyle  . Physical activity:    Days per week: Not on file    Minutes per session: Not on file  . Stress: Not on file  Relationships  .  Social connections:    Talks on phone: Not on file    Gets together: Not on file    Attends religious service: Not on file    Active member of club or organization: Not on file    Attends meetings of clubs or organizations: Not on file    Relationship status: Not on file  . Intimate partner violence:    Fear of current or ex partner: Not on file    Emotionally abused: Not on file    Physically abused: Not on file    Forced sexual activity: Not on file  Other Topics Concern  . Not on file  Social History Narrative  . Not on file    Current Outpatient Medications on File Prior to Visit  Medication Sig Dispense Refill  . ASPIRIN LOW DOSE 81 MG EC tablet TAKE 1 TABLET(81 MG) BY MOUTH DAILY 30 tablet 0  .  buPROPion (WELLBUTRIN XL) 150 MG 24 hr tablet TAKE 1 TABLET(150 MG) BY MOUTH DAILY 30 tablet 0  . Continuous Blood Gluc Sensor (FREESTYLE LIBRE 14 DAY SENSOR) MISC 1 Device by Does not apply route daily. Use one device every 14 days to check blood sugars. DX code E10.9. 3 each 11  . CONTOUR NEXT TEST test strip TEST BLOOD SUGAR FIVE TIMES DAILY 150 each 11  . insulin aspart (NOVOLOG) 100 UNIT/ML injection INJECT 60 UNITS DAILY IN PUMP AS DIRECTED 20 mL 2  . Lancets (ACCU-CHEK MULTICLIX) lancets Use to check blood sugar 5 times per day 200 each 2  . levothyroxine (SYNTHROID, LEVOTHROID) 100 MCG tablet Take 1 tablet (100 mcg total) by mouth daily before breakfast. 90 tablet 3  . linaclotide (LINZESS) 72 MCG capsule Take 1 capsule (72 mcg total) by mouth daily before breakfast. 30 capsule 11   No current facility-administered medications on file prior to visit.     No Known Allergies  Family History  Problem Relation Age of Onset  . Coronary artery disease Mother   . Congestive Heart Failure Maternal Grandmother   . Brain cancer Maternal Grandfather        dx in his early 25s  . Cancer Neg Hx   . Diabetes Neg Hx     BP 118/70 (BP Location: Left Arm, Patient Position: Sitting, Cuff Size: Normal)   Pulse 98   Wt 159 lb (72.1 kg)   SpO2 98%   BMI 28.17 kg/m    Review of Systems Denies fever, but she has myalgias and severe fatigue    Objective:   Physical Exam VITAL SIGNS:  See vs page GENERAL: no distress SKIN: moderate eczematous rash on the hands, arms, lower back, and upper chest.    Lab Results  Component Value Date   HGBA1C 8.7 11/06/2017      Assessment & Plan:  Rash, new, uncertain etiology Type 1 DM: slightly worse due to prednisone AB, improved Hypercalcemia: due for recheck. Severe fatigue: uncertain etiology.   Patient Instructions  Please continue these pump settings:   basal rate of 1.1 units/hr, except 2.2 units/hr, 2AM-6AM.   mealtime bolus of 1 unit/  12 grams carbohydrate, except subtract 1 unit from calculated supper bolus.   correction bolus (which some people call "sensitivity," or "insulin sensitivity ratio," or just "isr") of 1 unit for each 50 by which your glucose exceeds 100).  Taking the bolus frequently will help the blood sugar until the effect of the prednisone wears off.   blood tests are requested for you today.  We'll let you know about the results.  Please come back for a regular physical appointment in 1-2 months.

## 2017-11-06 NOTE — Patient Instructions (Addendum)
Please continue these pump settings:   basal rate of 1.1 units/hr, except 2.2 units/hr, 2AM-6AM.   mealtime bolus of 1 unit/ 12 grams carbohydrate, except subtract 1 unit from calculated supper bolus.   correction bolus (which some people call "sensitivity," or "insulin sensitivity ratio," or just "isr") of 1 unit for each 50 by which your glucose exceeds 100).  Taking the bolus frequently will help the blood sugar until the effect of the prednisone wears off.   blood tests are requested for you today.  We'll let you know about the results.  Please come back for a regular physical appointment in 1-2 months.

## 2017-11-08 ENCOUNTER — Telehealth: Payer: Self-pay | Admitting: Endocrinology

## 2017-11-08 DIAGNOSIS — R21 Rash and other nonspecific skin eruption: Secondary | ICD-10-CM | POA: Insufficient documentation

## 2017-11-08 MED ORDER — PROMETHAZINE-DM 6.25-15 MG/5ML PO SYRP: 5.0000 mL | ORAL_SOLUTION | Freq: Four times a day (QID) | ORAL | 0 refills | Status: DC | PRN

## 2017-11-08 NOTE — Telephone Encounter (Signed)
Patient is unclear on labs & what could be cashing the rash she is experiencing. Also I see no increase in patient's chart for thyroid medication. Please advise patient is concerned?

## 2017-11-08 NOTE — Telephone Encounter (Signed)
    Patient calling for lab results 

## 2017-11-08 NOTE — Telephone Encounter (Signed)
I am afraid to prescribe antibiotic in view of the rash, but I can prescribe cough syrup.  I have sent a prescription to your pharmacy

## 2017-11-08 NOTE — Telephone Encounter (Signed)
I called patient to tell her about referral & she is also concerned of lingering cough from bronchitis? She is unsure if that's the cause & do you think she needs more antibiotic etc?

## 2017-11-08 NOTE — Telephone Encounter (Signed)
Labs were good. Please see a dermatology specialist.  you will receive a phone call, about a day and time for an appointment

## 2017-11-10 LAB — PTH, INTACT AND CALCIUM
CALCIUM: 8.4 mg/dL — AB (ref 8.6–10.4)
PTH: 75 pg/mL — AB (ref 14–64)

## 2017-11-10 LAB — ANA: ANA: NEGATIVE

## 2017-11-10 LAB — ALDOLASE: Aldolase: 5.9 U/L (ref <=8.1)

## 2017-11-11 NOTE — Telephone Encounter (Signed)
Patient was notified that cough syrup was sent to pharmacy.

## 2017-11-12 DIAGNOSIS — S0502XA Injury of conjunctiva and corneal abrasion without foreign body, left eye, initial encounter: Secondary | ICD-10-CM | POA: Diagnosis not present

## 2017-11-13 DIAGNOSIS — S0502XD Injury of conjunctiva and corneal abrasion without foreign body, left eye, subsequent encounter: Secondary | ICD-10-CM | POA: Diagnosis not present

## 2017-11-15 ENCOUNTER — Other Ambulatory Visit: Payer: Self-pay

## 2017-11-18 ENCOUNTER — Other Ambulatory Visit: Payer: Self-pay

## 2017-11-18 ENCOUNTER — Telehealth: Payer: Self-pay | Admitting: Endocrinology

## 2017-11-18 DIAGNOSIS — D485 Neoplasm of uncertain behavior of skin: Secondary | ICD-10-CM | POA: Diagnosis not present

## 2017-11-18 MED ORDER — INSULIN LISPRO 100 UNIT/ML CARTRIDGE: SUBCUTANEOUS | 2 refills | Status: DC

## 2017-11-18 NOTE — Telephone Encounter (Signed)
I have faxed.  

## 2017-11-18 NOTE — Telephone Encounter (Signed)
[place] Demonologist would like patients recent labs faxed over to their office   FAX- [phone]

## 2017-11-19 ENCOUNTER — Ambulatory Visit: Payer: 59 | Admitting: Endocrinology

## 2017-11-19 ENCOUNTER — Telehealth: Payer: Self-pay | Admitting: Endocrinology

## 2017-11-19 DIAGNOSIS — Z0289 Encounter for other administrative examinations: Secondary | ICD-10-CM

## 2017-11-19 NOTE — Telephone Encounter (Signed)
Patient stated she would like to see if the doctor can send in a prescription for her. She is coughing still and states she has no energy. She believes she may still have bronchitis.    Please advise

## 2017-11-19 NOTE — Telephone Encounter (Signed)
Patient is aware and an appointment scheduled 

## 2017-11-19 NOTE — Telephone Encounter (Signed)
please call patient: I am sorry to hear you are still sick. Please advise ov tomorrow.

## 2017-11-20 ENCOUNTER — Encounter: Payer: Self-pay | Admitting: Endocrinology

## 2017-11-20 ENCOUNTER — Ambulatory Visit
Admission: RE | Admit: 2017-11-20 | Discharge: 2017-11-20 | Disposition: A | Discharge status: Home / Self Care | Payer: 59 | Source: Ambulatory Visit | Attending: Endocrinology | Admitting: Endocrinology

## 2017-11-20 ENCOUNTER — Ambulatory Visit: Payer: 59 | Admitting: Endocrinology

## 2017-11-20 VITALS — BP 102/64 | HR 110 | Wt 153.2 lb

## 2017-11-20 DIAGNOSIS — R05 Cough: Secondary | ICD-10-CM

## 2017-11-20 DIAGNOSIS — R059 Cough, unspecified: Secondary | ICD-10-CM

## 2017-11-20 DIAGNOSIS — R0602 Shortness of breath: Secondary | ICD-10-CM

## 2017-11-20 DIAGNOSIS — R5383 Other fatigue: Secondary | ICD-10-CM | POA: Diagnosis not present

## 2017-11-20 MED ORDER — AZITHROMYCIN 500 MG PO TABS: 500.0000 mg | ORAL_TABLET | Freq: Every day | ORAL | 0 refills | Status: DC

## 2017-11-20 NOTE — Patient Instructions (Addendum)
Please sign a release of information, so we know what antibiotic it was.   A chest x-ray is requested for you today.  We'll let you know about the results. I have sent a prescription to your pharmacy, for an antibiotic pill.   I'll see you next time.

## 2017-11-20 NOTE — Progress Notes (Signed)
Subjective:    Patient ID: Jasmine Schwartz, female    DOB: [birth year], 52 y.o.   MRN: [id number]  HPI 8 weeks of moderate prod-quality cough (clear sputum) in the chest, and assoc fatigue.  No hemoptysis. She was seen at [facility] 4 weeks ago.  She was rx'ed prednisone then. She was also rx'ed abx (does not recall name--thinks it was doxycycline), but she stopped after 2 days, due to rash.  She saw derm, who did bx 2 days ago.   cbg's are well-controlled.   Past Medical History:  Diagnosis Date  . Anxiety   . Anxiety and depression 07/06/2011  . Breast cancer ([place]) 01/01/13   /metastatic 1/1 lymph nodes  . Depression   . Diabetes mellitus    IDDM  . Dyslipidemia   . Hx of insertion of insulin pump ~ 2001 to present (06/09/2013)  . Lower extremity edema   . PONV (postoperative nausea and vomiting)   . Type I diabetes mellitus (HCC)    uses insulin pump  . Venous insufficiency     Past Surgical History:  Procedure Laterality Date  . BREAST BIOPSY Right 01/01/13   Invasive mammary ca,metastatic in 1/1 lymph node  . BREAST BIOPSY Left 01/09/13   Fibroadenoma,microcalcifications  . CESAREAN SECTION  1989  . LUMBAR DISC SURGERY  2009   Dr Maxie Better  . MASTECTOMY COMPLETE / SIMPLE Left 06/09/2013  . MASTECTOMY MODIFIED RADICAL Right 06/09/2013  . MASTECTOMY MODIFIED RADICAL Right 06/09/2013   Procedure: RIGHT MASTECTOMY MODIFIED RADICAL;  Surgeon: Adin Hector, MD;  Location: [place];  Service: General;  Laterality: Right;  . PORTACATH PLACEMENT Left 01/2013  . SIMPLE MASTECTOMY WITH AXILLARY SENTINEL NODE BIOPSY Left 06/09/2013   Procedure: LEFT SIMPLE MASTECTOMY;  Surgeon: Adin Hector, MD;  Location: [place];  Service: General;  Laterality: Left;  . TUBAL LIGATION  1991    Social History   Socioeconomic History  . Marital status: Married    Spouse name: Not on file  . Number of children: 2  . Years of education: Not on file  . Highest education level: Not on file  Occupational  History    Employer: [employer]   Social Needs  . Financial resource strain: Not on file  . Food insecurity:    Worry: Not on file    Inability: Not on file  . Transportation needs:    Medical: Not on file    Non-medical: Not on file  Tobacco Use  . Smoking status: Never Smoker  . Smokeless tobacco: Never Used  Substance and Sexual Activity  . Alcohol use: No  . Drug use: No  . Sexual activity: Yes  Lifestyle  . Physical activity:    Days per week: Not on file    Minutes per session: Not on file  . Stress: Not on file  Relationships  . Social connections:    Talks on phone: Not on file    Gets together: Not on file    Attends religious service: Not on file    Active member of club or organization: Not on file    Attends meetings of clubs or organizations: Not on file    Relationship status: Not on file  . Intimate partner violence:    Fear of current or ex partner: Not on file    Emotionally abused: Not on file    Physically abused: Not on file    Forced sexual activity: Not on file  Other Topics Concern  .  Not on file  Social History Narrative  . Not on file    Current Outpatient Medications on File Prior to Visit  Medication Sig Dispense Refill  . ASPIRIN LOW DOSE 81 MG EC tablet TAKE 1 TABLET(81 MG) BY MOUTH DAILY 30 tablet 0  . buPROPion (WELLBUTRIN XL) 150 MG 24 hr tablet TAKE 1 TABLET(150 MG) BY MOUTH DAILY 30 tablet 0  . Continuous Blood Gluc Sensor (FREESTYLE LIBRE 14 DAY SENSOR) MISC 1 Device by Does not apply route daily. Use one device every 14 days to check blood sugars. DX code E10.9. 3 each 11  . CONTOUR NEXT TEST test strip TEST BLOOD SUGAR FIVE TIMES DAILY 150 each 11  . insulin aspart (NOVOLOG) 100 UNIT/ML injection INJECT 60 UNITS DAILY IN PUMP AS DIRECTED 20 mL 2  . insulin lispro (HUMALOG) 100 UNIT/ML cartridge Use 60 units daily in insulin pump 20 mL 2  . Lancets (ACCU-CHEK MULTICLIX) lancets Use to check blood sugar 5 times per day 200 each  2  . levothyroxine (SYNTHROID, LEVOTHROID) 100 MCG tablet Take 1 tablet (100 mcg total) by mouth daily before breakfast. 90 tablet 3  . linaclotide (LINZESS) 72 MCG capsule Take 1 capsule (72 mcg total) by mouth daily before breakfast. 30 capsule 11  . promethazine-dextromethorphan (PROMETHAZINE-DM) 6.25-15 MG/5ML syrup Take 5 mLs by mouth 4 (four) times daily as needed for cough. 240 mL 0   No current facility-administered medications on file prior to visit.     No Known Allergies  Family History  Problem Relation Age of Onset  . Coronary artery disease Mother   . Congestive Heart Failure Maternal Grandmother   . Brain cancer Maternal Grandfather        dx in his early 55s  . Cancer Neg Hx   . Diabetes Neg Hx     BP 102/64 (BP Location: Left Arm, Patient Position: Sitting, Cuff Size: Normal)   Pulse (!) 110   Wt 153 lb 3.2 oz (69.5 kg)   SpO2 96%   BMI 27.14 kg/m    Review of Systems She has slight sob and fever.  She has lost a few lbs.      Objective:   Physical Exam VITAL SIGNS:  See vs page GENERAL: no distress LUNGS:  Clear to auscultation.       Assessment & Plan:  Cough, persistent, uncertain etiology   Patient Instructions  Please sign a release of information, so we know what antibiotic it was.   A chest x-ray is requested for you today.  We'll let you know about the results. I have sent a prescription to your pharmacy, for an antibiotic pill.   I'll see you next time.

## 2017-11-21 ENCOUNTER — Telehealth: Payer: Self-pay | Admitting: Endocrinology

## 2017-11-21 NOTE — Telephone Encounter (Signed)
Patient had a chest xray done yesterday and would like to know if her results are back  Please advise

## 2017-11-21 NOTE — Telephone Encounter (Signed)
I left detailed message for patient letting her know x-rays showed walking pneumonia. I asked her to take antibiotics prescribed & if she had any further issues to give [place] a call back.

## 2017-11-25 DIAGNOSIS — J22 Unspecified acute lower respiratory infection: Secondary | ICD-10-CM | POA: Diagnosis not present

## 2017-11-25 DIAGNOSIS — J069 Acute upper respiratory infection, unspecified: Secondary | ICD-10-CM | POA: Diagnosis not present

## 2017-11-25 DIAGNOSIS — R05 Cough: Secondary | ICD-10-CM | POA: Diagnosis not present

## 2017-11-28 ENCOUNTER — Telehealth: Payer: Self-pay | Admitting: Endocrinology

## 2017-11-28 NOTE — Telephone Encounter (Signed)
Jasmine Schwartz with [facility] ph# [phone] called re: status of receipt of records release form. They are taking over as PCP but Patient will still be seeing Dr. Loanne Drilling for Endocrinology

## 2017-11-28 NOTE — Telephone Encounter (Signed)
I have called and left message for Estill Bamberg that I have sent release of medical records to our medical records dept.

## 2017-12-03 DIAGNOSIS — R05 Cough: Secondary | ICD-10-CM | POA: Diagnosis not present

## 2017-12-03 DIAGNOSIS — R5381 Other malaise: Secondary | ICD-10-CM | POA: Diagnosis not present

## 2017-12-03 DIAGNOSIS — R21 Rash and other nonspecific skin eruption: Secondary | ICD-10-CM | POA: Diagnosis not present

## 2017-12-05 ENCOUNTER — Emergency Department (HOSPITAL_COMMUNITY): Payer: 59

## 2017-12-05 ENCOUNTER — Other Ambulatory Visit: Payer: Self-pay

## 2017-12-05 ENCOUNTER — Emergency Department (HOSPITAL_COMMUNITY)
Admission: EM | Admit: 2017-12-05 | Discharge: 2017-12-05 | Disposition: A | Discharge status: Home / Self Care | Payer: 59 | Attending: Emergency Medicine | Admitting: Emergency Medicine

## 2017-12-05 ENCOUNTER — Encounter (HOSPITAL_COMMUNITY): Payer: Self-pay | Admitting: Emergency Medicine

## 2017-12-05 DIAGNOSIS — R590 Localized enlarged lymph nodes: Secondary | ICD-10-CM | POA: Diagnosis not present

## 2017-12-05 DIAGNOSIS — E039 Hypothyroidism, unspecified: Secondary | ICD-10-CM | POA: Insufficient documentation

## 2017-12-05 DIAGNOSIS — R05 Cough: Secondary | ICD-10-CM | POA: Insufficient documentation

## 2017-12-05 DIAGNOSIS — R634 Abnormal weight loss: Secondary | ICD-10-CM | POA: Diagnosis not present

## 2017-12-05 DIAGNOSIS — R21 Rash and other nonspecific skin eruption: Secondary | ICD-10-CM | POA: Insufficient documentation

## 2017-12-05 DIAGNOSIS — R531 Weakness: Secondary | ICD-10-CM | POA: Diagnosis present

## 2017-12-05 DIAGNOSIS — E119 Type 2 diabetes mellitus without complications: Secondary | ICD-10-CM | POA: Insufficient documentation

## 2017-12-05 DIAGNOSIS — Z853 Personal history of malignant neoplasm of breast: Secondary | ICD-10-CM | POA: Diagnosis not present

## 2017-12-05 DIAGNOSIS — C779 Secondary and unspecified malignant neoplasm of lymph node, unspecified: Secondary | ICD-10-CM | POA: Insufficient documentation

## 2017-12-05 DIAGNOSIS — E860 Dehydration: Secondary | ICD-10-CM | POA: Insufficient documentation

## 2017-12-05 DIAGNOSIS — R0602 Shortness of breath: Secondary | ICD-10-CM | POA: Diagnosis not present

## 2017-12-05 LAB — TSH: TSH: 2.198 u[IU]/mL (ref 0.350–4.500)

## 2017-12-05 LAB — CBC WITH DIFFERENTIAL/PLATELET
BASOS ABS: 0 10*3/uL (ref 0.0–0.1)
Basophils Relative: 0 %
EOS ABS: 0 10*3/uL (ref 0.0–0.7)
EOS PCT: 0 %
HCT: 38 % (ref 36.0–46.0)
Hemoglobin: 12.4 g/dL (ref 12.0–15.0)
LYMPHS ABS: 0.6 10*3/uL — AB (ref 0.7–4.0)
LYMPHS PCT: 11 %
MCH: 26.8 pg (ref 26.0–34.0)
MCHC: 32.6 g/dL (ref 30.0–36.0)
MCV: 82.3 fL (ref 78.0–100.0)
MONOS PCT: 10 %
Monocytes Absolute: 0.6 10*3/uL (ref 0.1–1.0)
NEUTROS ABS: 4.4 10*3/uL (ref 1.7–7.7)
Neutrophils Relative %: 79 %
PLATELETS: 356 10*3/uL (ref 150–400)
RBC: 4.62 MIL/uL (ref 3.87–5.11)
RDW: 14.2 % (ref 11.5–15.5)
WBC: 5.6 10*3/uL (ref 4.0–10.5)

## 2017-12-05 LAB — COMPREHENSIVE METABOLIC PANEL
ALBUMIN: 3 g/dL — AB (ref 3.5–5.0)
ALT: 29 U/L (ref 14–54)
AST: 51 U/L — AB (ref 15–41)
Alkaline Phosphatase: 71 U/L (ref 38–126)
Anion gap: 10 (ref 5–15)
BUN: 11 mg/dL (ref 6–20)
CHLORIDE: 99 mmol/L — AB (ref 101–111)
CO2: 23 mmol/L (ref 22–32)
Calcium: 8.7 mg/dL — ABNORMAL LOW (ref 8.9–10.3)
Creatinine, Ser: 0.76 mg/dL (ref 0.44–1.00)
GFR calc Af Amer: >60 mL/min (ref >60)
GFR calc non Af Amer: >60 mL/min (ref >60)
GLUCOSE: 251 mg/dL — AB (ref 65–99)
POTASSIUM: 4.2 mmol/L (ref 3.5–5.1)
Sodium: 132 mmol/L — ABNORMAL LOW (ref 135–145)
Total Bilirubin: 1 mg/dL (ref 0.3–1.2)
Total Protein: 7.1 g/dL (ref 6.5–8.1)

## 2017-12-05 LAB — URINALYSIS, ROUTINE W REFLEX MICROSCOPIC
Bilirubin Urine: NEGATIVE
HGB URINE DIPSTICK: NEGATIVE
Ketones, ur: NEGATIVE mg/dL
Leukocytes, UA: NEGATIVE
NITRITE: NEGATIVE
PROTEIN: 30 mg/dL — AB
Specific Gravity, Urine: 1.026 (ref 1.005–1.030)
pH: 5 (ref 5.0–8.0)

## 2017-12-05 LAB — I-STAT CG4 LACTIC ACID, ED
LACTIC ACID, VENOUS: 1.42 mmol/L (ref 0.5–1.9)
Lactic Acid, Venous: 1.79 mmol/L (ref 0.5–1.9)

## 2017-12-05 LAB — CK: Total CK: 112 U/L (ref 38–234)

## 2017-12-05 LAB — C-REACTIVE PROTEIN: CRP: <0.8 mg/dL (ref <1.0)

## 2017-12-05 LAB — LACTATE DEHYDROGENASE: LDH: 255 U/L — ABNORMAL HIGH (ref 98–192)

## 2017-12-05 LAB — SEDIMENTATION RATE: SED RATE: 58 mm/h — AB (ref 0–22)

## 2017-12-05 MED ORDER — LACTATED RINGERS IV BOLUS
1000.0000 mL | Freq: Once | INTRAVENOUS | Status: AC
  Administered 2017-12-05: 1000 mL via INTRAVENOUS

## 2017-12-05 MED ORDER — IOPAMIDOL (ISOVUE-300) INJECTION 61%
INTRAVENOUS | Status: AC
  Filled 2017-12-05: qty 100

## 2017-12-05 MED ORDER — LEVOFLOXACIN IN D5W 750 MG/150ML IV SOLN
750.0000 mg | Freq: Once | INTRAVENOUS | Status: AC
  Administered 2017-12-05: 750 mg via INTRAVENOUS
  Filled 2017-12-05: qty 150

## 2017-12-05 MED ORDER — LEVOFLOXACIN 750 MG PO TABS: 750.0000 mg | ORAL_TABLET | Freq: Every day | ORAL | 0 refills | Status: DC

## 2017-12-05 MED ORDER — IOPAMIDOL (ISOVUE-300) INJECTION 61%
100.0000 mL | Freq: Once | INTRAVENOUS | Status: AC | PRN
  Administered 2017-12-05: 75 mL via INTRAVENOUS

## 2017-12-05 NOTE — ED Triage Notes (Signed)
Patient from home with complaint of shortness breath, states she was diagnosed with pneumonia in February, multiple antibiotics. Patient also complains of a rash that has spread over her chest and arms that started after the first antibiotic she took in February but the rash never went away. Patient not currently on abx.

## 2017-12-05 NOTE — ED Provider Notes (Signed)
Emergency Department Provider Note   I have reviewed the triage vital signs and the nursing notes.   HISTORY  Chief Complaint Weakness   HPI Jasmine Schwartz is a 52 y.o. female multiple past medical problems as documented below but of note diabetes, hyperlipidemia, breast cancer status post mastectomy and radiation who presents to the emergency department today with generalized weakness.  Patient states for the last couple months she has had a persistently dry cough, hoarse voice, 3 pound weight loss and over the last couple weeks is a persistently worsening weakness.  She states that her proximal leg muscles just feel very weak whenever she tries to do anything even just ended up take a shower.  She states she feels so weak that she cannot wash her hair and stand at the same time.  Over this time she is had a persistent nonproductive dry cough that has been resistant to multiple rounds of antibiotics and steroids.  No fevers.  She notes that her hoarseness may be improving now but has been pretty persistent over the last couple months.  She gets follow-up at her oncologist regularly without any abnormalities.  She had a dose of doxycycline about a month ago and her rash came up around that time so she stopped taking it but the rash has persisted.  She saw dermatology who did a biopsy as it was indeterminate.  The rashes on her upper chest arms and back.  Some nausea no vomiting or diarrhea. No other associated or modifying symptoms.    Past Medical History:  Diagnosis Date  . Anxiety   . Anxiety and depression 07/06/2011  . Breast cancer ([place]) 01/01/13   /metastatic 1/1 lymph nodes  . Depression   . Diabetes mellitus    IDDM  . Dyslipidemia   . Hx of insertion of insulin pump ~ 2001 to present (06/09/2013)  . Lower extremity edema   . PONV (postoperative nausea and vomiting)   . Type I diabetes mellitus (HCC)    uses insulin pump  . Venous insufficiency     Patient Active Problem  List   Diagnosis Date Noted  . Rash 11/08/2017  . Myalgia 11/06/2017  . Hypercalcemia 11/06/2017  . Cough 10/09/2017  . Syncope 07/12/2017  . Chest pain 02/15/2016  . Genetic testing 04/28/2015  . Hypothyroidism 04/06/2015  . Screening examination for infectious disease 04/06/2015  . Wellness examination 09/16/2014  . Lymphedema of upper extremity 07/13/2014  . Lower extremity edema 05/27/2013  . Breast cancer of upper-outer quadrant of right female breast ([place]) 01/01/2013  . Shortness of breath 03/31/2012  . Anxiety and depression 07/06/2011  . Encounter for long-term (current) use of other medications 03/23/2011  . Goiter 03/23/2011  . URTICARIA 03/23/2010  . HYPERCHOLESTEROLEMIA 10/25/2008  . LEG PAIN, RIGHT 02/04/2008  . Type 1 diabetes mellitus ([place]) 03/20/2007  . ANXIETY 03/20/2007  . DEPRESSION 03/20/2007    Past Surgical History:  Procedure Laterality Date  . BREAST BIOPSY Right 01/01/13   Invasive mammary ca,metastatic in 1/1 lymph node  . BREAST BIOPSY Left 01/09/13   Fibroadenoma,microcalcifications  . CESAREAN SECTION  1989  . LUMBAR DISC SURGERY  2009   Dr Maxie Better  . MASTECTOMY COMPLETE / SIMPLE Left 06/09/2013  . MASTECTOMY MODIFIED RADICAL Right 06/09/2013  . MASTECTOMY MODIFIED RADICAL Right 06/09/2013   Procedure: RIGHT MASTECTOMY MODIFIED RADICAL;  Surgeon: Adin Hector, MD;  Location: [facility];  Service: General;  Laterality: Right;  . PORTACATH PLACEMENT Left 01/2013  .  SIMPLE MASTECTOMY WITH AXILLARY SENTINEL NODE BIOPSY Left 06/09/2013   Procedure: LEFT SIMPLE MASTECTOMY;  Surgeon: Adin Hector, MD;  Location: [facility];  Service: General;  Laterality: Left;  . TUBAL LIGATION  1991    Current Outpatient Rx  . Order #: [id number] Class: Normal  . Order #: [id number] Class: Normal  . Order #: [id number] Class: Normal  . Order #: [id number] Class: Normal  . Order #: [id number] Class: Normal  . Order #: [id number] Class: Normal  . Order #: [id number] Class: Normal    . Order #: [id number] Class: Normal  . Order #: [id number] Class: Print  . Order #: [id number] Class: Normal  . Order #: [id number] Class: Normal  . Order #: [id number] Class: Normal    Allergies Patient has no known allergies.  Family History  Problem Relation Age of Onset  . Coronary artery disease Mother   . Congestive Heart Failure Maternal Grandmother   . Brain cancer Maternal Grandfather        dx in his early 31s  . Cancer Neg Hx   . Diabetes Neg Hx     Social History Social History   Tobacco Use  . Smoking status: Never Smoker  . Smokeless tobacco: Never Used  Substance Use Topics  . Alcohol use: No  . Drug use: No    Review of Systems  All other systems negative except as documented in the HPI. All pertinent positives and negatives as reviewed in the HPI. ____________________________________________   PHYSICAL EXAM:  VITAL SIGNS: ED Triage Vitals  Enc Vitals Group     BP 12/05/17 1015 129/89     Pulse Rate 12/05/17 1012 (!) 117     Resp 12/05/17 1012 16     Temp 12/05/17 1012 98.9 F (37.2 C)     Temp Source 12/05/17 1012 Oral     SpO2 12/05/17 1012 100 %    Constitutional: Alert and oriented. Well appearing and in no acute distress. Eyes: Conjunctivae are normal. PERRL. EOMI. Head: Atraumatic. Nose: No congestion/rhinnorhea. Mouth/Throat: Mucous membranes are moist.  Oropharynx non-erythematous. Neck: No stridor.  No meningeal signs.   Cardiovascular: Normal rate but HR becomes 120 upon sitting up, regular rhythm. Good peripheral circulation. Grossly normal heart sounds.   Respiratory: Normal respiratory effort.  No retractions. Lungs CTAB. Gastrointestinal: Soft and nontender. No distention.  Musculoskeletal: No lower extremity tenderness nor edema. No gross deformities of extremities. Neurologic:  Normal speech and language. No gross focal neurologic deficits are appreciated.  Skin:  Skin is warm, dry and intact. Erythema, scaling and excoriations to  whole upper chest, upper arms and left upper back is noted.   ____________________________________________   LABS (all labs ordered are listed, but only abnormal results are displayed)  Labs Reviewed  COMPREHENSIVE METABOLIC PANEL - Abnormal; Notable for the following components:      Result Value   Sodium 132 (*)    Chloride 99 (*)    Glucose, Bld 251 (*)    Calcium 8.7 (*)    Albumin 3.0 (*)    AST 51 (*)    All other components within normal limits  CBC WITH DIFFERENTIAL/PLATELET - Abnormal; Notable for the following components:   Lymphs Abs 0.6 (*)    All other components within normal limits  URINALYSIS, ROUTINE W REFLEX MICROSCOPIC - Abnormal; Notable for the following components:   Color, Urine AMBER (*)    APPearance HAZY (*)    Glucose, UA >=500 (*)    Protein, ur 30 (*)    Bacteria, UA MANY (*)  All other components within normal limits  LACTATE DEHYDROGENASE - Abnormal; Notable for the following components:   LDH 255 (*)    All other components within normal limits  SEDIMENTATION RATE - Abnormal; Notable for the following components:   Sed Rate 58 (*)    All other components within normal limits  CK  TSH  C-REACTIVE PROTEIN  ANTINUCLEAR ANTIBODIES, IFA  I-STAT CG4 LACTIC ACID, ED  I-STAT CG4 LACTIC ACID, ED   ____________________________________________  RADIOLOGY  Dg Chest 2 View  Result Date: 12/05/2017 CLINICAL DATA:  Shortness of breath, weakness, T EXAM: CHEST - 2 VIEW COMPARISON:  11/20/2017 FINDINGS: Heart and mediastinal contours are within normal limits. No focal opacities or effusions. No acute bony abnormality. IMPRESSION: No active cardiopulmonary disease. Electronically Signed   By: Rolm Baptise M.D.   On: 12/05/2017 11:24   Ct Chest W Contrast  Result Date: 12/05/2017 CLINICAL DATA:  Shortness of breath. Productive cough. History of breast cancer. EXAM: CT CHEST WITH CONTRAST TECHNIQUE: Multidetector CT imaging of the chest was performed  during intravenous contrast administration. CONTRAST:  66mL ISOVUE-300 IOPAMIDOL (ISOVUE-300) INJECTION 61% COMPARISON:  CT scan of January 20, 2013. FINDINGS: Cardiovascular: No significant vascular findings. Normal heart size. No pericardial effusion. Mediastinum/Nodes: The thyroid gland and esophagus are unremarkable. Right axillary surgical clips are noted. 1 cm left supraclavicular lymph node is noted. This was not present on prior exam. 5 mm right paratracheal lymph node is noted which is increased compared to prior exam. 4 mm lymph node is noted in aortopulmonary window concerning for metastatic disease. Lungs/Pleura: No pneumothorax or pleural effusion is noted. Multifocal airspace opacities are noted throughout both lungs concerning for multifocal pneumonia. Upper Abdomen: No acute abnormality. Musculoskeletal: No chest wall abnormality. No acute or significant osseous findings. IMPRESSION: Right axillary lymph nodes noted on prior exam have been surgically resected. Interval development of 1 cm left supraclavicular lymph node, with enlarged right paratracheal and aortopulmonary lymph nodes. This is concerning for metastatic disease given the history of breast cancer. Multifocal airspace opacities are noted throughout both lungs concerning for multifocal pneumonia. Electronically Signed   By: Marijo Conception, M.D.   On: 12/05/2017 17:22    ____________________________________________   PROCEDURES  Procedure(s) performed:   Procedures   ____________________________________________   INITIAL IMPRESSION / ASSESSMENT AND PLAN / ED COURSE  52 year old female with 2 months of progressively worsening hoarse voice, weakness, 20 pound weight loss in the setting of a chronic dry cough that has been resistant to antibiotics.  Chest x-ray concerning to me for possible metastatic cancer, discussed it with the radiologist who is worried about a couple of the enlarged lymph nodes but thinks that the lung  findings are more consistent with an atypical multifocal pneumonia.  We will start her on Levaquin for that and have her follow-up with her oncologist.  Fluids given for dehydration tachycardia seem to improve some with that but not totally.   Pertinent labs & imaging results that were available during my care of the patient were reviewed by me and considered in my medical decision making (see chart for details).  ____________________________________________  FINAL CLINICAL IMPRESSION(S) / ED DIAGNOSES  Final diagnoses:  Weakness  Dehydration  Lymphadenopathy, supraclavicular     MEDICATIONS GIVEN DURING THIS VISIT:  Medications  lactated ringers bolus 1,000 mL (0 mLs Intravenous Stopped 12/05/17 1743)  iopamidol (ISOVUE-300) 61 % injection 100 mL (75 mLs Intravenous Contrast Given 12/05/17 1639)  levofloxacin (LEVAQUIN) IVPB 750 mg (0  mg Intravenous Stopped 12/05/17 1945)  lactated ringers bolus 1,000 mL (1,000 mLs Intravenous New Bag/Given 12/05/17 1809)     NEW OUTPATIENT MEDICATIONS STARTED DURING THIS VISIT:  New Prescriptions   LEVOFLOXACIN (LEVAQUIN) 750 MG TABLET    Take 1 tablet (750 mg total) by mouth daily. X 7 days    Note:  This note was prepared with assistance of Dragon voice recognition software. Occasional wrong-word or sound-a-like substitutions may have occurred due to the inherent limitations of voice recognition software.   Merrily Pew, MD 12/05/17 2021

## 2017-12-06 LAB — ANTINUCLEAR ANTIBODIES, IFA: ANA Ab, IFA: NEGATIVE

## 2017-12-10 ENCOUNTER — Telehealth: Payer: Self-pay

## 2017-12-10 ENCOUNTER — Telehealth: Payer: Self-pay | Admitting: *Deleted

## 2017-12-10 DIAGNOSIS — M3210 Systemic lupus erythematosus, organ or system involvement unspecified: Secondary | ICD-10-CM | POA: Diagnosis not present

## 2017-12-10 DIAGNOSIS — R531 Weakness: Secondary | ICD-10-CM | POA: Diagnosis not present

## 2017-12-10 NOTE — Telephone Encounter (Signed)
Called and rescheduled pt for appt on May 7th at 915. Pt aware of date/time.  Cyndia Bent RN

## 2017-12-10 NOTE — Telephone Encounter (Signed)
Returned pt call and LVM for patient regarding CT scan and Dr. Lindi Adie discussing this with her at her upcoming visit.  Cyndia Bent RN

## 2017-12-10 NOTE — Telephone Encounter (Signed)
Voicemail received requesting "Was Mendel Ryder able to talk with Dr, Lindi Adie about the CT scab results?"  Message forwarded to collaborative nurses for further patient communication.

## 2017-12-16 DIAGNOSIS — L930 Discoid lupus erythematosus: Secondary | ICD-10-CM | POA: Diagnosis not present

## 2017-12-17 ENCOUNTER — Inpatient Hospital Stay: Payer: 59 | Attending: Hematology and Oncology | Admitting: Hematology and Oncology

## 2017-12-17 VITALS — BP 110/68 | HR 94 | Temp 98.4°F | Resp 17 | Ht 69.0 in | Wt 147.7 lb

## 2017-12-17 DIAGNOSIS — R590 Localized enlarged lymph nodes: Secondary | ICD-10-CM

## 2017-12-17 DIAGNOSIS — Z853 Personal history of malignant neoplasm of breast: Secondary | ICD-10-CM | POA: Insufficient documentation

## 2017-12-17 DIAGNOSIS — Z171 Estrogen receptor negative status [ER-]: Secondary | ICD-10-CM | POA: Diagnosis not present

## 2017-12-17 DIAGNOSIS — E039 Hypothyroidism, unspecified: Secondary | ICD-10-CM | POA: Insufficient documentation

## 2017-12-17 DIAGNOSIS — C50411 Malignant neoplasm of upper-outer quadrant of right female breast: Secondary | ICD-10-CM | POA: Diagnosis not present

## 2017-12-17 NOTE — Progress Notes (Signed)
Patient Care Team: [name], MD as PCP - General Sid Falcon., MD as Referring Physician (Obstetrics and Gynecology)  DIAGNOSIS:  Encounter Diagnoses  Name Primary?  . 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 Supraclavicular lymphadenopathy Yes    SUMMARY OF ONCOLOGIC HISTORY:   Malignant neoplasm of upper-outer quadrant of right breast in female, estrogen receptor negative ([place])   01/01/2013 Initial Diagnosis    Mammogram showed diffuse skin thickening and enlarged right axillary lymph nodes with right breast density ultrasound showed 4.5 cm mass right axillary 3.1 cm biopsy showed IMC grade 2-3 lymph node also positive ER/PR neg HER-2 pos Ki-67 76%, MRI 8 cm mas      01/16/2013 - 05/01/2013 Neo-Adjuvant Chemotherapy    Taxotere carboplatin Herceptin and Perjeta x6 cycles      05/29/2013 Surgery    Bilateral mastectomies, left breasts no evidence of cancer right breast no evidence of cancer 40 lymph nodes negative complete pathologic response       - 02/02/2014 Chemotherapy    Adjuvant Herceptin      07/27/2013 - 09/11/2013 Radiation Therapy    Adjuvant radiation therapy       CHIEF COMPLIANT: Follow-up to discuss recent CT chest findings  INTERVAL HISTORY: Jasmine Schwartz is a 52 year old with above-mentioned history of right breast cancer treated with neoadjuvant chemotherapy followed by bilateral mastectomies.  She had complete pathologic response.  She finished adjuvant Herceptin and radiation is currently on surveillance.  She was in the emergency room complaining of shortness of breath productive cough and had a CT chest which showed lymphadenopathy in the supraclavicular paratracheal region.  This raises concern for metastatic carcinoma and she was referred to [place] for further evaluation. She had profound cough and expectoration.  All of the symptoms have improved when she started Levaquin.  She also has  joint pains as well as a diffuse skin rash on the chest which is maculopapular in nature.  With the lung findings and the joint findings and fatigue and 20 pound weight loss, autoimmune disease is under consideration and she is referred to rheumatology.  Rash on the chest  REVIEW OF SYSTEMS:   Constitutional: 21 pound weight loss since February 2019 Eyes: Denies blurriness of vision Ears, nose, mouth, throat, and face: Slight hoarseness of voice and sore throat Respiratory: Denies cough, dyspnea or wheezes Cardiovascular: Denies palpitation, chest discomfort Gastrointestinal:  Denies nausea, heartburn or change in bowel habits Skin: Maculopapular rash on chest wall Lymphatics: Denies new lymphadenopathy or easy bruising Neurological:Denies numbness, tingling or new weaknesses Behavioral/Psych: Mood is stable, no new changes  Extremities: Interphalangeal joint pain and swelling Breast: Bilateral mastectomies All other systems were reviewed with the patient and are negative.  I have reviewed the past medical history, past surgical history, social history and family history with the patient and they are unchanged from previous note.  ALLERGIES:  has No Known Allergies.  MEDICATIONS:  Current Outpatient Medications  Medication Sig Dispense Refill  . ASPIRIN LOW DOSE 81 MG EC tablet TAKE 1 TABLET(81 MG) BY MOUTH DAILY 30 tablet 0  . azithromycin (ZITHROMAX) 500 MG tablet Take 1 tablet (500 mg total) by mouth daily. 5 tablet 0  . buPROPion (WELLBUTRIN XL) 150 MG 24 hr tablet TAKE 1 TABLET(150 MG) BY MOUTH DAILY 30 tablet 0  . Continuous Blood Gluc Sensor (FREESTYLE LIBRE 14 DAY SENSOR) MISC 1 Device by Does not apply route daily. Use one device  every 14 days to check blood sugars. DX code E10.9. 3 each 11  . CONTOUR NEXT TEST test strip TEST BLOOD SUGAR FIVE TIMES DAILY 150 each 11  . insulin aspart (NOVOLOG) 100 UNIT/ML injection INJECT 60 UNITS DAILY IN PUMP AS DIRECTED 20 mL 2  . insulin  lispro (HUMALOG) 100 UNIT/ML cartridge Use 60 units daily in insulin pump 20 mL 2  . Lancets (ACCU-CHEK MULTICLIX) lancets Use to check blood sugar 5 times per day 200 each 2  . levofloxacin (LEVAQUIN) 750 MG tablet Take 1 tablet (750 mg total) by mouth daily. X 7 days 7 tablet 0  . levothyroxine (SYNTHROID, LEVOTHROID) 100 MCG tablet Take 1 tablet (100 mcg total) by mouth daily before breakfast. 90 tablet 3  . linaclotide (LINZESS) 72 MCG capsule Take 1 capsule (72 mcg total) by mouth daily before breakfast. 30 capsule 11  . promethazine-dextromethorphan (PROMETHAZINE-DM) 6.25-15 MG/5ML syrup Take 5 mLs by mouth 4 (four) times daily as needed for cough. 240 mL 0   No current facility-administered medications for this visit.     PHYSICAL EXAMINATION: ECOG PERFORMANCE STATUS: 1 - Symptomatic but completely ambulatory  Vitals:   12/17/17 0924  BP: 110/68  Pulse: 94  Resp: 17  Temp: 98.4 F (36.9 C)  SpO2: 100%   Filed Weights   12/17/17 0924  Weight: 147 lb 11.2 oz (67 kg)    GENERAL:alert, no distress and comfortable SKIN: Severe maculopapular skin rash on the chest biopsy was performed which was inconclusive EYES: normal, Conjunctiva are pink and non-injected, sclera clear OROPHARYNX:no exudate, no erythema and lips, buccal mucosa, and tongue normal  NECK: supple, thyroid normal size, non-tender, without nodularity LYMPH:  no palpable lymphadenopathy in the cervical, axillary or inguinal LUNGS: clear to auscultation and percussion with normal breathing effort HEART: regular rate & rhythm and no murmurs and no lower extremity edema ABDOMEN:abdomen soft, non-tender and normal bowel sounds MUSCULOSKELETAL:no cyanosis of digits and no clubbing  NEURO: alert & oriented x 3 with fluent speech, no focal motor/sensory deficits EXTREMITIES: No lower extremity edema  LABORATORY DATA:  I have reviewed the data as listed CMP Latest Ref Rng & Units 12/05/2017 11/06/2017 11/06/2017  Glucose  65 - 99 mg/dL 251(H) 234(H) -  BUN 6 - 20 mg/dL 11 15 -  Creatinine 0.44 - 1.00 mg/dL 0.76 0.75 -  Sodium 135 - 145 mmol/L 132(L) 135 -  Potassium 3.5 - 5.1 mmol/L 4.2 3.7 -  Chloride 101 - 111 mmol/L 99(L) 100 -  CO2 22 - 32 mmol/L 23 25 -  Calcium 8.9 - 10.3 mg/dL 8.7(L) 8.3(L) 8.4(L)  Total Protein 6.5 - 8.1 g/dL 7.1 - 6.6  Total Bilirubin 0.3 - 1.2 mg/dL 1.0 - 0.7  Alkaline Phos 38 - 126 U/L 71 - 78  AST 15 - 41 U/L 51(H) - 43(H)  ALT 14 - 54 U/L 29 - 28    Lab Results  Component Value Date   WBC 5.6 12/05/2017   HGB 12.4 12/05/2017   HCT 38.0 12/05/2017   MCV 82.3 12/05/2017   PLT 356 12/05/2017   NEUTROABS 4.4 12/05/2017    ASSESSMENT & PLAN:  Malignant neoplasm of upper-outer quadrant of right breast in female, estrogen receptor negative (HCC) Stage II, HER-2/neu positive, 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 Patient has underwent neoadjuvant treatment, followed by bilateral mastectomies with a complete pathologic response. She then completed a calendar year of Herceptin.  Breast Cancer Surveillance: 1. Breast  exam 07/12/2017: Normal chest wall exam 2. Mammogram: No role of imaging.  Hypothyroidism: being managed with primary care, recent TSH was very high and her Synthroid dose was adjusted.  CT chest on 12/05/2017: Interval development of 1 cm left supraclavicular lymph node, enlarged right paratracheal and aortopulmonary lymph nodes concerning for metastatic disease.  Multifocal airspace disease both lungs concerning for multifocal pneumonia  Radiology review: I discussed the radiology report and provided her with a copy of this report. I discussed the case with interventional radiology Dr. Reece Agar who agreed to [facility] biopsy of the left supraclavicular lymph node under ultrasound guidance.   I discussed with the patient that sometimes it could be false negative because the size of the lymph node is fairly small. Return to clinic after  biopsy to discuss the pathology report    Orders Placed This Encounter  Procedures  . [place] CORE BIOPSY (LYMPH NODES)    Supraclavicular lymph node biopsy    Standing Status:   Future    Standing Expiration Date:   02/17/2019    Scheduling Instructions:     I discussed the case with Dr. Carman Ching who approved the biopsy    Order Specific Question:   Lab orders requested (DO NOT place separate lab orders, these will be automatically ordered during procedure specimen collection):    Answer:   Cytology - Non Pap    Order Specific Question:   Lab orders requested (DO NOT place separate lab orders, these will be automatically ordered during procedure specimen collection):    Answer:   Surgical Pathology    Order Specific Question:   Reason for Exam (SYMPTOM  OR DIAGNOSIS REQUIRED)    Answer:   History of breast cancer with enlarged lymph nodes    Order Specific Question:   Preferred imaging location?    Answer:   [facility]   The patient has a good understanding of the overall plan. she agrees with it. she will call with any problems that may develop before the next visit here.   Harriette Ohara, MD 12/17/17

## 2017-12-17 NOTE — Assessment & Plan Note (Signed)
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 Patient has underwent neoadjuvant treatment, followed by bilateral mastectomies with a complete pathologic response. She then completed a calendar year of Herceptin.  Breast Cancer Surveillance: 1. Breast exam 07/12/2017: Normal chest wall exam 2. Mammogram: No role of imaging.  Hypothyroidism: being managed with primary care, recent TSH was very high and her Synthroid dose was adjusted.  CT chest on 12/05/2017: Interval development of 1 cm left supraclavicular lymph node, enlarged right paratracheal and aortopulmonary lymph nodes concerning for metastatic disease.  Multifocal airspace disease both lungs concerning for multifocal pneumonia  Radiology review: I discussed the radiology report and provided her with a copy of this report. I will call interventional radiology to see if any of these lymph nodes can be biopsied.  Return to clinic after biopsy to discuss the pathology report

## 2017-12-19 ENCOUNTER — Ambulatory Visit: Payer: 59 | Admitting: Hematology and Oncology

## 2017-12-20 ENCOUNTER — Other Ambulatory Visit: Payer: Self-pay

## 2017-12-25 ENCOUNTER — Other Ambulatory Visit: Payer: Self-pay | Admitting: Radiology

## 2017-12-26 ENCOUNTER — Ambulatory Visit (HOSPITAL_COMMUNITY): Payer: 59

## 2017-12-27 ENCOUNTER — Encounter (HOSPITAL_COMMUNITY): Payer: Self-pay

## 2017-12-27 ENCOUNTER — Ambulatory Visit (HOSPITAL_COMMUNITY)
Admission: RE | Admit: 2017-12-27 | Discharge: 2017-12-27 | Disposition: A | Discharge status: Home / Self Care | Payer: 59 | Source: Ambulatory Visit | Attending: Hematology and Oncology | Admitting: Hematology and Oncology

## 2017-12-27 DIAGNOSIS — Z171 Estrogen receptor negative status [ER-]: Secondary | ICD-10-CM | POA: Insufficient documentation

## 2017-12-27 DIAGNOSIS — Z794 Long term (current) use of insulin: Secondary | ICD-10-CM | POA: Diagnosis not present

## 2017-12-27 DIAGNOSIS — J189 Pneumonia, unspecified organism: Secondary | ICD-10-CM | POA: Diagnosis not present

## 2017-12-27 DIAGNOSIS — I872 Venous insufficiency (chronic) (peripheral): Secondary | ICD-10-CM | POA: Diagnosis not present

## 2017-12-27 DIAGNOSIS — E109 Type 1 diabetes mellitus without complications: Secondary | ICD-10-CM | POA: Insufficient documentation

## 2017-12-27 DIAGNOSIS — R590 Localized enlarged lymph nodes: Secondary | ICD-10-CM | POA: Insufficient documentation

## 2017-12-27 DIAGNOSIS — Z79899 Other long term (current) drug therapy: Secondary | ICD-10-CM | POA: Diagnosis not present

## 2017-12-27 DIAGNOSIS — Z9013 Acquired absence of bilateral breasts and nipples: Secondary | ICD-10-CM | POA: Diagnosis not present

## 2017-12-27 DIAGNOSIS — F329 Major depressive disorder, single episode, unspecified: Secondary | ICD-10-CM | POA: Diagnosis not present

## 2017-12-27 DIAGNOSIS — R21 Rash and other nonspecific skin eruption: Secondary | ICD-10-CM | POA: Diagnosis not present

## 2017-12-27 DIAGNOSIS — F419 Anxiety disorder, unspecified: Secondary | ICD-10-CM | POA: Insufficient documentation

## 2017-12-27 DIAGNOSIS — E785 Hyperlipidemia, unspecified: Secondary | ICD-10-CM | POA: Diagnosis not present

## 2017-12-27 DIAGNOSIS — R05 Cough: Secondary | ICD-10-CM | POA: Insufficient documentation

## 2017-12-27 DIAGNOSIS — Z9851 Tubal ligation status: Secondary | ICD-10-CM | POA: Diagnosis not present

## 2017-12-27 DIAGNOSIS — C50411 Malignant neoplasm of upper-outer quadrant of right female breast: Secondary | ICD-10-CM | POA: Diagnosis not present

## 2017-12-27 DIAGNOSIS — R599 Enlarged lymph nodes, unspecified: Secondary | ICD-10-CM | POA: Diagnosis not present

## 2017-12-27 DIAGNOSIS — Z7982 Long term (current) use of aspirin: Secondary | ICD-10-CM | POA: Insufficient documentation

## 2017-12-27 DIAGNOSIS — Z9889 Other specified postprocedural states: Secondary | ICD-10-CM | POA: Insufficient documentation

## 2017-12-27 DIAGNOSIS — C50919 Malignant neoplasm of unspecified site of unspecified female breast: Secondary | ICD-10-CM | POA: Diagnosis not present

## 2017-12-27 LAB — CBC WITH DIFFERENTIAL/PLATELET
BASOS ABS: 0 10*3/uL (ref 0.0–0.1)
BASOS PCT: 0 %
EOS PCT: 1 %
Eosinophils Absolute: 0 10*3/uL (ref 0.0–0.7)
HCT: 36.8 % (ref 36.0–46.0)
Hemoglobin: 12.2 g/dL (ref 12.0–15.0)
Lymphocytes Relative: 15 %
Lymphs Abs: 0.5 10*3/uL — ABNORMAL LOW (ref 0.7–4.0)
MCH: 27.5 pg (ref 26.0–34.0)
MCHC: 33.2 g/dL (ref 30.0–36.0)
MCV: 83.1 fL (ref 78.0–100.0)
MONO ABS: 0.2 10*3/uL (ref 0.1–1.0)
Monocytes Relative: 6 %
Neutro Abs: 2.9 10*3/uL (ref 1.7–7.7)
Neutrophils Relative %: 78 %
PLATELETS: 296 10*3/uL (ref 150–400)
RBC: 4.43 MIL/uL (ref 3.87–5.11)
RDW: 15.4 % (ref 11.5–15.5)
WBC: 3.7 10*3/uL — AB (ref 4.0–10.5)

## 2017-12-27 LAB — PROTIME-INR
INR: 0.91
Prothrombin Time: 12.1 seconds (ref 11.4–15.2)

## 2017-12-27 LAB — GLUCOSE, CAPILLARY: GLUCOSE-CAPILLARY: 286 mg/dL — AB (ref 65–99)

## 2017-12-27 MED ORDER — SODIUM CHLORIDE 0.9 % IV SOLN
INTRAVENOUS | Status: DC
  Administered 2017-12-27: 12:00:00 via INTRAVENOUS

## 2017-12-27 MED ORDER — LIDOCAINE HCL (PF) 1 % IJ SOLN
INTRAMUSCULAR | Status: AC | PRN
  Administered 2017-12-27: 5 mL

## 2017-12-27 MED ORDER — MIDAZOLAM HCL 2 MG/2ML IJ SOLN
INTRAMUSCULAR | Status: AC
  Filled 2017-12-27: qty 4

## 2017-12-27 MED ORDER — FENTANYL CITRATE (PF) 100 MCG/2ML IJ SOLN
INTRAMUSCULAR | Status: AC
  Filled 2017-12-27: qty 2

## 2017-12-27 MED ORDER — LIDOCAINE HCL 1 % IJ SOLN
INTRAMUSCULAR | Status: AC
  Filled 2017-12-27: qty 10

## 2017-12-27 MED ORDER — MIDAZOLAM HCL 2 MG/2ML IJ SOLN
INTRAMUSCULAR | Status: AC | PRN
  Administered 2017-12-27: 2 mg via INTRAVENOUS

## 2017-12-27 MED ORDER — FENTANYL CITRATE (PF) 100 MCG/2ML IJ SOLN
INTRAMUSCULAR | Status: AC | PRN
  Administered 2017-12-27 (×2): 50 ug via INTRAVENOUS

## 2017-12-27 NOTE — Procedures (Signed)
Interventional Radiology Procedure Note  Procedure: US guided biopsy of left supraclavicular lymph node  Complications: None  Estimated Blood Loss: < 10 mL  Findings: 1 x 1.4 cm left supraclavicular LN sampled with 18 G core needle device x 4.  Venetia Night. Kathlene Cote, M.D Pager:  [phone]

## 2017-12-27 NOTE — H&P (Signed)
Referring Physician(s): Nicholas Lose  Supervising Physician: Aletta Edouard  Patient Status:  [facility]  Chief Complaint:  "I'm having a neck biopsy"  Subjective: Patient familiar to IR service from prior Port-A-Cath placement in 2014. She has a history of right breast carcinoma, initially diagnosed in 2014, status post bilateral mastectomies as well as  chemoradiation.  She has been recently treated for pneumonia and underwent imaging which revealed interval development of a 1 cm left supraclavicular lymph node with enlarged right paratracheal and aortopulmonary lymph nodes as well concerning for possible metastatic disease.  She presents today for image guided biopsy of the left supraclavicular lymph node for further evaluation.  She currently denies fever, headache, worsening chest pain, dyspnea, abdominal/back pain, nausea, vomiting or bleeding.  She does have occasional cough as well as maculopapular skin rash on chest /upper back region. Past Medical History:  Diagnosis Date  . Anxiety   . Anxiety and depression 07/06/2011  . Breast cancer ([place]) 01/01/13   /metastatic 1/1 lymph nodes  . Depression   . Diabetes mellitus    IDDM  . Dyslipidemia   . Hx of insertion of insulin pump ~ 2001 to present (06/09/2013)  . Lower extremity edema   . PONV (postoperative nausea and vomiting)   . Type I diabetes mellitus (HCC)    uses insulin pump  . Venous insufficiency    Past Surgical History:  Procedure Laterality Date  . BREAST BIOPSY Right 01/01/13   Invasive mammary ca,metastatic in 1/1 lymph node  . BREAST BIOPSY Left 01/09/13   Fibroadenoma,microcalcifications  . CESAREAN SECTION  1989  . LUMBAR DISC SURGERY  2009   Dr Maxie Better  . MASTECTOMY COMPLETE / SIMPLE Left 06/09/2013  . MASTECTOMY MODIFIED RADICAL Right 06/09/2013  . MASTECTOMY MODIFIED RADICAL Right 06/09/2013   Procedure: RIGHT MASTECTOMY MODIFIED RADICAL;  Surgeon: Adin Hector, MD;  Location: [place];  Service:  General;  Laterality: Right;  . PORTACATH PLACEMENT Left 01/2013  . SIMPLE MASTECTOMY WITH AXILLARY SENTINEL NODE BIOPSY Left 06/09/2013   Procedure: LEFT SIMPLE MASTECTOMY;  Surgeon: Adin Hector, MD;  Location: [place];  Service: General;  Laterality: Left;  . TUBAL LIGATION  1991     Allergies: Patient has no known allergies.  Medications: Prior to Admission medications   Medication Sig Start Date End Date Taking? Authorizing Provider  Continuous Blood Gluc Sensor (FREESTYLE LIBRE 14 DAY SENSOR) MISC 1 Device by Does not apply route daily. Use one device every 14 days to check blood sugars. DX code E10.9. 09/25/17  Yes [name], MD  CONTOUR NEXT TEST test strip TEST BLOOD SUGAR FIVE TIMES DAILY 06/09/17  Yes [name], MD  hydroxychloroquine (PLAQUENIL) 200 MG tablet Take 200 mg by mouth 2 (two) times daily.   Yes [provider]  insulin aspart (NOVOLOG) 100 UNIT/ML injection INJECT 60 UNITS DAILY IN PUMP AS DIRECTED 06/13/17  Yes [name], MD  insulin lispro (HUMALOG) 100 UNIT/ML cartridge Use 60 units daily in insulin pump 11/18/17  Yes [name], MD  Lancets (ACCU-CHEK MULTICLIX) lancets Use to check blood sugar 5 times per day 06/29/15  Yes [name], MD  levothyroxine (SYNTHROID, LEVOTHROID) 100 MCG tablet Take 1 tablet (100 mcg total) by mouth daily before breakfast. 09/19/17  Yes [name], MD  ASPIRIN LOW DOSE 81 MG EC tablet TAKE 1 TABLET(81 MG) BY MOUTH DAILY 03/03/17   [name], MD  azithromycin (ZITHROMAX) 500 MG tablet Take 1 tablet (500 mg total) by  mouth daily. 11/20/17   [name], MD  buPROPion (WELLBUTRIN XL) 150 MG 24 hr tablet TAKE 1 TABLET(150 MG) BY MOUTH DAILY 07/14/17   [name], MD  levofloxacin (LEVAQUIN) 750 MG tablet Take 1 tablet (750 mg total) by mouth daily. X 7 days 12/05/17   Mesner, Corene Cornea, MD  linaclotide [facility]) 72 MCG capsule Take 1 capsule (72 mcg total) by mouth daily before breakfast. 08/02/17   [name], MD  promethazine-dextromethorphan (PROMETHAZINE-DM) 6.25-15 MG/5ML syrup Take 5 mLs by mouth 4 (four) times daily as needed for cough. 11/08/17   [name], MD     Vital Signs: Blood pressure 128/78, heart rate 88, temperature 97.9, respirations 18, O2 sat 100% room air   Physical Exam awake, alert.  Chest clear to auscultation bilaterally.  Heart with regular rate and rhythm.  Abdomen soft, positive bowel sounds, nontender.  No lower extremity edema.  Maculopapular skin rash upper chest/upper back region  Imaging: No results found.  Labs:  CBC: Recent Labs    07/12/17 0027 07/12/17 0355 12/05/17 1015 12/27/17 1125  WBC 9.7 12.6* 5.6 3.7*  HGB 12.5 12.1 12.4 12.2  HCT 36.9 36.0 38.0 36.8  PLT 373 310 356 296    COAGS: Recent Labs    12/27/17 1125  INR 0.91    BMP: Recent Labs    07/12/17 0027 07/12/17 0153 07/12/17 0355 11/06/17 1013 11/06/17 1442 12/05/17 1015  NA 133*  --  134*  --  135 132*  K 6.2* 3.9 4.0  --  3.7 4.2  CL 102  --  103  --  100 99*  CO2 26  --  23  --  25 23  GLUCOSE 71  --  210*  --  234* 251*  BUN 19  --  16  --  15 11  CALCIUM 8.6*  --  8.5* 8.4* 8.3* 8.7*  CREATININE 0.84  --  0.80  --  0.75 0.76  GFRNONAA >60  --  >60  --   --  >60  GFRAA >60  --  >60  --   --  >60    LIVER FUNCTION TESTS: Recent Labs    06/19/17 0841 11/06/17 1013 12/05/17 1015  BILITOT 0.5 0.7 1.0  AST 16 43* 51*  ALT 13 28 29   ALKPHOS 71 78 71  PROT 7.3 6.6 7.1  ALBUMIN 3.9 3.2* 3.0*    Assessment and Plan: Pt with history of right breast carcinoma, initially diagnosed in 2014, status post bilateral mastectomies as well as  chemoradiation.  She has been recently treated for pneumonia and underwent imaging which revealed interval development of a 1 cm left supraclavicular lymph node with enlarged right paratracheal and aortopulmonary lymph nodes as well concerning for possible metastatic disease.  She presents today for image guided biopsy of  the left supraclavicular lymph node for further evaluation.Risks and benefits discussed with the patient /spouse including, but not limited to bleeding, infection, damage to adjacent structures or low yield requiring additional tests.  All of the patient's questions were answered, patient is agreeable to proceed. Consent signed and in chart.     Electronically Signed: D. Rowe Robert, PA-C 12/27/2017, 12:37 PM   I spent a total of 25 minutes at the the patient's bedside AND on the patient's hospital floor or unit, greater than 50% of which was counseling/coordinating care for image guided left supraclavicular lymph node biopsy

## 2017-12-27 NOTE — Discharge Instructions (Signed)

## 2018-01-07 ENCOUNTER — Ambulatory Visit: Payer: Self-pay | Admitting: Allergy

## 2018-01-07 DIAGNOSIS — E109 Type 1 diabetes mellitus without complications: Secondary | ICD-10-CM | POA: Diagnosis not present

## 2018-01-07 DIAGNOSIS — Z794 Long term (current) use of insulin: Secondary | ICD-10-CM | POA: Diagnosis not present

## 2018-01-10 ENCOUNTER — Encounter: Payer: 59 | Admitting: Endocrinology

## 2018-01-13 DIAGNOSIS — L930 Discoid lupus erythematosus: Secondary | ICD-10-CM | POA: Diagnosis not present

## 2018-01-13 DIAGNOSIS — M3210 Systemic lupus erythematosus, organ or system involvement unspecified: Secondary | ICD-10-CM | POA: Diagnosis not present

## 2018-01-20 DIAGNOSIS — E103213 Type 1 diabetes mellitus with mild nonproliferative diabetic retinopathy with macular edema, bilateral: Secondary | ICD-10-CM | POA: Diagnosis not present

## 2018-01-28 DIAGNOSIS — Z79899 Other long term (current) drug therapy: Secondary | ICD-10-CM | POA: Diagnosis not present

## 2018-01-28 DIAGNOSIS — L930 Discoid lupus erythematosus: Secondary | ICD-10-CM | POA: Diagnosis not present

## 2018-01-28 DIAGNOSIS — R490 Dysphonia: Secondary | ICD-10-CM | POA: Diagnosis not present

## 2018-02-10 DIAGNOSIS — R490 Dysphonia: Secondary | ICD-10-CM | POA: Diagnosis not present

## 2018-02-14 ENCOUNTER — Other Ambulatory Visit: Payer: Self-pay | Admitting: Endocrinology

## 2018-02-27 ENCOUNTER — Ambulatory Visit: Payer: 59 | Admitting: Endocrinology

## 2018-02-27 ENCOUNTER — Encounter: Payer: Self-pay | Admitting: Endocrinology

## 2018-02-27 VITALS — BP 106/60 | HR 88 | Temp 98.3°F | Ht 63.0 in | Wt 142.8 lb

## 2018-02-27 DIAGNOSIS — E108 Type 1 diabetes mellitus with unspecified complications: Secondary | ICD-10-CM

## 2018-02-27 LAB — POCT GLYCOSYLATED HEMOGLOBIN (HGB A1C): HEMOGLOBIN A1C: 7.9 % — AB (ref 4.0–5.6)

## 2018-02-27 NOTE — Patient Instructions (Addendum)
Please continue these pump settings:  basal rate of 1.7 units/HR, 24 HRS per day.  mealtime bolus of 1 unit/9 grams carbohydrate.  correction bolus (which some people call "sensitivity," or "insulin sensitivity ratio," or just "isr") of 1 unit for each 40 by which your glucose exceeds 100).  Taking the bolus frequently will help the blood sugar until the effect of the prednisone wears off.   Please come back for a regular physical appointment in 2 months.

## 2018-02-27 NOTE — Progress Notes (Signed)
Subjective:    Patient ID: Jasmine Schwartz, female    DOB: February 25, 1966, 52 y.o.   MRN: [id number]  HPI Pt returns for f/u of diabetes mellitus:  DM type: 1 Dx'ed: 7341 Complications: none.  Therapy: insulin since dx DKA: never Severe hypoglycemia: never Pancreatitis: never Other: she has been on pump rx since 2004; she declined continuous glucose monitor, due to cost; she has finished rx for her breast cancer, for now.  Interval history:  She takes a total of approx 70 units/day.  basal rate of 1.1 units/hr, except 2.2 units/hr, 2AM-6AM.   mealtime bolus of 1 unit/ 12 grams carbohydrate, except subtract 1 unit from calculated supper bolus.   correction bolus (which some people call "sensitivity," or "insulin sensitivity ratio," or just "isr") of 1 unit for each 50 by which your glucose exceeds 100).  She has been intermittently on steroids for SLE.  She now takes 5 mg BID.  Plan is to take this for some months.  Due to this, she has increased basal to MN: 1.75  0230: 1.8  0600: 1.6.  She says cbg's vary from 250-300.  It is in general higher as the day goes on.   Past Medical History:  Diagnosis Date  . Anxiety   . Anxiety and depression 07/06/2011  . Breast cancer ([place]) 01/01/13   /metastatic 1/1 lymph nodes  . Depression   . Diabetes mellitus    IDDM  . Dyslipidemia   . Hx of insertion of insulin pump ~ 2001 to present (06/09/2013)  . Lower extremity edema   . PONV (postoperative nausea and vomiting)   . Type I diabetes mellitus (HCC)    uses insulin pump  . Venous insufficiency     Past Surgical History:  Procedure Laterality Date  . BREAST BIOPSY Right 01/01/13   Invasive mammary ca,metastatic in 1/1 lymph node  . BREAST BIOPSY Left 01/09/13   Fibroadenoma,microcalcifications  . CESAREAN SECTION  1989  . LUMBAR DISC SURGERY  2009   Dr Maxie Better  . MASTECTOMY COMPLETE / SIMPLE Left 06/09/2013  . MASTECTOMY MODIFIED RADICAL Right 06/09/2013  . MASTECTOMY MODIFIED RADICAL Right  06/09/2013   Procedure: RIGHT MASTECTOMY MODIFIED RADICAL;  Surgeon: Adin Hector, MD;  Location: [place];  Service: General;  Laterality: Right;  . PORTACATH PLACEMENT Left 01/2013  . SIMPLE MASTECTOMY WITH AXILLARY SENTINEL NODE BIOPSY Left 06/09/2013   Procedure: LEFT SIMPLE MASTECTOMY;  Surgeon: Adin Hector, MD;  Location: [place];  Service: General;  Laterality: Left;  . TUBAL LIGATION  1991    Social History   Socioeconomic History  . Marital status: Married    Spouse name: Not on file  . Number of children: 2  . Years of education: Not on file  . Highest education level: Not on file  Occupational History    Employer: [employer]   Social Needs  . Financial resource strain: Not on file  . Food insecurity:    Worry: Not on file    Inability: Not on file  . Transportation needs:    Medical: Not on file    Non-medical: Not on file  Tobacco Use  . Smoking status: Never Smoker  . Smokeless tobacco: Never Used  Substance and Sexual Activity  . Alcohol use: No  . Drug use: No  . Sexual activity: Yes  Lifestyle  . Physical activity:    Days per week: Not on file    Minutes per session: Not on file  .  Stress: Not on file  Relationships  . Social connections:    Talks on phone: Not on file    Gets together: Not on file    Attends religious service: Not on file    Active member of club or organization: Not on file    Attends meetings of clubs or organizations: Not on file    Relationship status: Not on file  . Intimate partner violence:    Fear of current or ex partner: Not on file    Emotionally abused: Not on file    Physically abused: Not on file    Forced sexual activity: Not on file  Other Topics Concern  . Not on file  Social History Narrative  . Not on file    Current Outpatient Medications on File Prior to Visit  Medication Sig Dispense Refill  . ASPIRIN LOW DOSE 81 MG EC tablet TAKE 1 TABLET(81 MG) BY MOUTH DAILY 30 tablet 0  . azithromycin  (ZITHROMAX) 500 MG tablet Take 1 tablet (500 mg total) by mouth daily. 5 tablet 0  . buPROPion (WELLBUTRIN XL) 150 MG 24 hr tablet TAKE 1 TABLET(150 MG) BY MOUTH DAILY 30 tablet 0  . Continuous Blood Gluc Sensor (FREESTYLE LIBRE 14 DAY SENSOR) MISC 1 Device by Does not apply route daily. Use one device every 14 days to check blood sugars. DX code E10.9. 3 each 11  . CONTOUR NEXT TEST test strip TEST BLOOD SUGAR FIVE TIMES DAILY 150 each 11  . hydroxychloroquine (PLAQUENIL) 200 MG tablet Take 200 mg by mouth 2 (two) times daily.    . insulin aspart (NOVOLOG) 100 UNIT/ML injection INJECT 60 UNITS DAILY IN PUMP AS DIRECTED 20 mL 2  . insulin lispro (HUMALOG) 100 UNIT/ML injection ADMINISTER 60 UNITS VIA INSULIN PUMP DAILY 20 mL 0  . Lancets (ACCU-CHEK MULTICLIX) lancets Use to check blood sugar 5 times per day 200 each 2  . levofloxacin (LEVAQUIN) 750 MG tablet Take 1 tablet (750 mg total) by mouth daily. X 7 days 7 tablet 0  . levothyroxine (SYNTHROID, LEVOTHROID) 100 MCG tablet Take 1 tablet (100 mcg total) by mouth daily before breakfast. 90 tablet 3  . linaclotide (LINZESS) 72 MCG capsule Take 1 capsule (72 mcg total) by mouth daily before breakfast. 30 capsule 11  . predniSONE (DELTASONE) 5 MG tablet Take 2 tablets by mouth once a day     No current facility-administered medications on file prior to visit.     No Known Allergies  Family History  Problem Relation Age of Onset  . Coronary artery disease Mother   . Congestive Heart Failure Maternal Grandmother   . Brain cancer Maternal Grandfather        dx in his early 74s  . Cancer Neg Hx   . Diabetes Neg Hx     BP 106/60 (BP Location: Left Arm, Patient Position: Sitting, Cuff Size: Normal)   Pulse 88   Temp 98.3 F (36.8 C) (Oral)   Ht 5\' 3"  (1.6 m)   Wt 142 lb 12.8 oz (64.8 kg)   SpO2 99%   BMI 25.30 kg/m   Review of Systems She denies hypoglycemia.      Objective:   Physical Exam VITAL SIGNS:  See vs page GENERAL: no  distress Pulses: dorsalis pedis intact bilat.   MSK: no deformity of the feet CV: no leg edema Skin:  no ulcer on the feet.  normal color and temp on the feet. Neuro: sensation is intact to touch  on the feet  Lab Results  Component Value Date   HGBA1C 7.9 (A) 02/27/2018       Assessment & Plan:  Type 1 DM: Based on the pattern of her cbg's, she needs some adjustment in her therapy.  SLE: new to me: steroids are affecting a1c.   Please take these pump settings: basal rate of 1.7 units/HR, 24 HRS per day.  mealtime bolus of 1 unit/9 grams carbohydrate.  correction bolus (which some people call "sensitivity," or "insulin sensitivity ratio," or just "isr") of 1 unit for each 40 by which your glucose exceeds 100).

## 2018-04-01 DIAGNOSIS — R21 Rash and other nonspecific skin eruption: Secondary | ICD-10-CM | POA: Diagnosis not present

## 2018-04-01 DIAGNOSIS — M25521 Pain in right elbow: Secondary | ICD-10-CM | POA: Diagnosis not present

## 2018-04-03 DIAGNOSIS — R21 Rash and other nonspecific skin eruption: Secondary | ICD-10-CM | POA: Diagnosis not present

## 2018-04-03 DIAGNOSIS — R600 Localized edema: Secondary | ICD-10-CM | POA: Diagnosis not present

## 2018-04-03 DIAGNOSIS — R609 Edema, unspecified: Secondary | ICD-10-CM | POA: Diagnosis not present

## 2018-04-03 DIAGNOSIS — M25521 Pain in right elbow: Secondary | ICD-10-CM | POA: Diagnosis not present

## 2018-04-21 DIAGNOSIS — L03113 Cellulitis of right upper limb: Secondary | ICD-10-CM | POA: Diagnosis not present

## 2018-04-24 ENCOUNTER — Other Ambulatory Visit (INDEPENDENT_AMBULATORY_CARE_PROVIDER_SITE_OTHER): Payer: Self-pay | Admitting: Orthopedic Surgery

## 2018-04-24 ENCOUNTER — Encounter (INDEPENDENT_AMBULATORY_CARE_PROVIDER_SITE_OTHER): Payer: Self-pay | Admitting: Orthopedic Surgery

## 2018-04-24 ENCOUNTER — Ambulatory Visit (INDEPENDENT_AMBULATORY_CARE_PROVIDER_SITE_OTHER): Payer: 59 | Admitting: Orthopedic Surgery

## 2018-04-24 VITALS — Ht 63.0 in | Wt 142.8 lb

## 2018-04-24 DIAGNOSIS — L89014 Pressure ulcer of right elbow, stage 4: Secondary | ICD-10-CM

## 2018-04-24 DIAGNOSIS — M869 Osteomyelitis, unspecified: Secondary | ICD-10-CM

## 2018-04-24 DIAGNOSIS — M25521 Pain in right elbow: Secondary | ICD-10-CM | POA: Diagnosis not present

## 2018-04-24 MED ORDER — DOXYCYCLINE HYCLATE 100 MG PO TABS
100.0000 mg | ORAL_TABLET | Freq: Two times a day (BID) | ORAL | 0 refills | Status: DC
Start: 2018-04-24 — End: 2018-05-07

## 2018-04-24 MED ORDER — MUPIROCIN 2 % EX OINT: 1.0000 "application " | TOPICAL_OINTMENT | Freq: Two times a day (BID) | CUTANEOUS | 3 refills | Status: DC

## 2018-04-24 NOTE — Progress Notes (Signed)
Office Visit Note   Patient: Jasmine Schwartz           Date of Birth: May 02, 1966           MRN: [id number] Visit Date: 04/24/2018              Requested by: [name], MD [address] [place], [place] [id number] PCP: [name], MD  Chief Complaint  Patient presents with  . Right Elbow - Pain, Open Wound      HPI: Patient is a 52 year old woman with a 30-year history of type 1 diabetes.  She states her hemoglobin A1c runs around 7.9.  Patient states she has had several weeks of swelling over the right olecranon bursa.  She states it popped last Friday started draining she has been on Ceftin ear 300 mg twice daily 1 week.  Patient denies a history of gout.  She states she has been diagnosed with lupus she has been on prednisone.  She has had an x-ray and CT scan of the elbow.  Assessment & Plan: Visit Diagnoses:  1. Pain in right elbow   2. Pressure injury of right elbow, stage 4 (HCC)     Plan: The ulcer extends to bone we will get an MRI scan to rule out osteomyelitis.  We will call in a prescription for doxycycline 100 mg twice a day cultures are obtained.  We will follow-up after the urgent MRI scan and determine long-term antibiotics as well as surgical debridement which may require debridement of the olecranon if the MRI scan shows osteomyelitis.  She is also call in a prescription for Bactroban to be applied daily with a loosely wrapped Ace wrap.  Follow-Up Instructions: Return in about 1 week (around 05/01/2018).   Ortho Exam  Patient is alert, oriented, no adenopathy, well-dressed, normal affect, normal respiratory effort. Examination patient does have a Maller rash as well as a rash on her upper extremities.  She has a little bit of swelling the right upper extremity she states this is secondary to mastectomy for breast cancer.  She has a wound which is 5 mm in diameter and probes 1 cm deep into the olecranon bursa.  Some of the bursal tissue was sharply  excised with a 10 blade knife.  There is no purulence no cellulitis no odor.  Cultures were sent after sterilely prepping the skin with deep wound cultures.  Imaging: No results found. No images are attached to the encounter.  Labs: Lab Results  Component Value Date   HGBA1C 7.9 (A) 02/27/2018   HGBA1C 8.7 11/06/2017   HGBA1C 7.2 06/19/2017   ESRSEDRATE 58 (H) 12/05/2017   ESRSEDRATE 44 (H) 11/06/2017   ESRSEDRATE 33 (H) 02/04/2008   CRP <0.8 12/05/2017     Lab Results  Component Value Date   ALBUMIN 3.0 (L) 12/05/2017   ALBUMIN 3.2 (L) 11/06/2017   ALBUMIN 3.9 06/19/2017    Body mass index is 25.3 kg/m.  Orders:  Orders Placed This Encounter  Procedures  . MR Elbow Right w/o contrast  . Uric acid   No orders of the defined types were placed in this encounter.    Procedures: No procedures performed  Clinical Data: No additional findings.  ROS:  All other systems negative, except as noted in the HPI. Review of Systems  Objective: Vital Signs: Ht 5\' 3"  (1.6 m)   Wt 142 lb 12.8 oz (64.8 kg)   BMI 25.30 kg/m   Specialty Comments:  No  specialty comments available.  PMFS History: Patient Active Problem List   Diagnosis Date Noted  . Rash 11/08/2017  . Myalgia 11/06/2017  . Hypercalcemia 11/06/2017  . Cough 10/09/2017  . Syncope 07/12/2017  . Chest pain 02/15/2016  . Genetic testing 04/28/2015  . Hypothyroidism 04/06/2015  . Screening examination for infectious disease 04/06/2015  . Wellness examination 09/16/2014  . Lymphedema of upper extremity 07/13/2014  . Lower extremity edema 05/27/2013  . Malignant neoplasm of upper-outer quadrant of right breast in female, estrogen receptor negative ([place]) 01/01/2013  . Shortness of breath 03/31/2012  . Anxiety and depression 07/06/2011  . Encounter for long-term (current) use of other medications 03/23/2011  . Goiter 03/23/2011  . URTICARIA 03/23/2010  . HYPERCHOLESTEROLEMIA 10/25/2008  . LEG PAIN,  RIGHT 02/04/2008  . Type 1 diabetes mellitus ([place]) 03/20/2007  . ANXIETY 03/20/2007  . DEPRESSION 03/20/2007   Past Medical History:  Diagnosis Date  . Anxiety   . Anxiety and depression 07/06/2011  . Breast cancer ([place]) 01/01/13   /metastatic 1/1 lymph nodes  . Depression   . Diabetes mellitus    IDDM  . Dyslipidemia   . Hx of insertion of insulin pump ~ 2001 to present (06/09/2013)  . Lower extremity edema   . PONV (postoperative nausea and vomiting)   . Type I diabetes mellitus (HCC)    uses insulin pump  . Venous insufficiency     Family History  Problem Relation Age of Onset  . Coronary artery disease Mother   . Congestive Heart Failure Maternal Grandmother   . Brain cancer Maternal Grandfather        dx in his early 26s  . Cancer Neg Hx   . Diabetes Neg Hx     Past Surgical History:  Procedure Laterality Date  . BREAST BIOPSY Right 01/01/13   Invasive mammary ca,metastatic in 1/1 lymph node  . BREAST BIOPSY Left 01/09/13   Fibroadenoma,microcalcifications  . CESAREAN SECTION  1989  . LUMBAR DISC SURGERY  2009   Dr Maxie Better  . MASTECTOMY COMPLETE / SIMPLE Left 06/09/2013  . MASTECTOMY MODIFIED RADICAL Right 06/09/2013  . MASTECTOMY MODIFIED RADICAL Right 06/09/2013   Procedure: RIGHT MASTECTOMY MODIFIED RADICAL;  Surgeon: Adin Hector, MD;  Location: [place];  Service: General;  Laterality: Right;  . PORTACATH PLACEMENT Left 01/2013  . SIMPLE MASTECTOMY WITH AXILLARY SENTINEL NODE BIOPSY Left 06/09/2013   Procedure: LEFT SIMPLE MASTECTOMY;  Surgeon: Adin Hector, MD;  Location: [place];  Service: General;  Laterality: Left;  . TUBAL LIGATION  1991   Social History   Occupational History    Employer: [employer]   Tobacco Use  . Smoking status: Never Smoker  . Smokeless tobacco: Never Used  Substance and Sexual Activity  . Alcohol use: No  . Drug use: No  . Sexual activity: Yes

## 2018-04-25 ENCOUNTER — Encounter: Payer: 59 | Admitting: Endocrinology

## 2018-04-25 LAB — TIQ-NTM

## 2018-04-25 LAB — EXTRA LAV TOP TUBE

## 2018-04-25 LAB — URIC ACID: Uric Acid, Serum: 3.7 mg/dL (ref 2.5–7.0)

## 2018-04-29 ENCOUNTER — Ambulatory Visit
Admission: RE | Admit: 2018-04-29 | Discharge: 2018-04-29 | Disposition: A | Discharge status: Home / Self Care | Payer: 59 | Source: Ambulatory Visit | Attending: Orthopedic Surgery | Admitting: Orthopedic Surgery

## 2018-04-29 DIAGNOSIS — M25521 Pain in right elbow: Secondary | ICD-10-CM

## 2018-04-29 DIAGNOSIS — L03113 Cellulitis of right upper limb: Secondary | ICD-10-CM | POA: Diagnosis not present

## 2018-04-29 DIAGNOSIS — M869 Osteomyelitis, unspecified: Secondary | ICD-10-CM

## 2018-04-30 ENCOUNTER — Ambulatory Visit (INDEPENDENT_AMBULATORY_CARE_PROVIDER_SITE_OTHER): Payer: 59 | Admitting: Family

## 2018-04-30 ENCOUNTER — Encounter (INDEPENDENT_AMBULATORY_CARE_PROVIDER_SITE_OTHER): Payer: Self-pay | Admitting: Family

## 2018-04-30 VITALS — Ht 63.0 in | Wt 142.8 lb

## 2018-04-30 DIAGNOSIS — M71121 Other infective bursitis, right elbow: Secondary | ICD-10-CM

## 2018-04-30 MED ORDER — SULFAMETHOXAZOLE-TRIMETHOPRIM 800-160 MG PO TABS: 1.0000 | ORAL_TABLET | Freq: Two times a day (BID) | ORAL | 0 refills | Status: DC

## 2018-05-05 DIAGNOSIS — L930 Discoid lupus erythematosus: Secondary | ICD-10-CM | POA: Diagnosis not present

## 2018-05-05 DIAGNOSIS — Z79899 Other long term (current) drug therapy: Secondary | ICD-10-CM | POA: Diagnosis not present

## 2018-05-05 DIAGNOSIS — M791 Myalgia, unspecified site: Secondary | ICD-10-CM | POA: Diagnosis not present

## 2018-05-05 NOTE — Progress Notes (Signed)
Office Visit Note   Patient: Jasmine Schwartz           Date of Birth: 12/27/1965           MRN: [id number] Visit Date: 04/30/2018              Requested by: [name], MD [address] [place], [place] [address] PCP: [name], MD  Chief Complaint  Patient presents with  . Right Elbow - Follow-up, Wound Check      HPI: Patient is a 52 year old woman with a 30-year history of type 1 diabetes.  She states her hemoglobin A1c runs around 7.9.  Patient states she has had several weeks of swelling over the right olecranon bursa.    Today is seen in follow up for MRI review of right elbow. Has been doing about the same. Less drainage. Still erythema and swelling. No fever or chills.   Began a doxycycline course after last visit but stopped this after breaking out into a rash.   Assessment & Plan: Visit Diagnoses:  No diagnosis found.  Plan: continue with bactroban dressing changes twice daily. Start the Bactrim. Stop ceftin and doxycycline. Discussed return precautions and MRI. Will discuss MRI with Dr. Sharol Given this pm and call patient with surgical plan. MRI negative for osteomyelitis.   Follow-Up Instructions: No follow-ups on file.   Ortho Exam  Patient is alert, oriented, no adenopathy, well-dressed, normal affect, normal respiratory effort. Examination has moderate swelling the right upper extremity and elbow. This is indurated and erythematous. no ascending cellulitis.  She has a wound which is 5 mm in diameter over the olecranon bursa.  There is no purulence. no odor.   Imaging: No results found. No images are attached to the encounter.  Labs: Lab Results  Component Value Date   HGBA1C 7.9 (A) 02/27/2018   HGBA1C 8.7 11/06/2017   HGBA1C 7.2 06/19/2017   ESRSEDRATE 58 (H) 12/05/2017   ESRSEDRATE 44 (H) 11/06/2017   ESRSEDRATE 33 (H) 02/04/2008   CRP <0.8 12/05/2017   LABURIC 3.7 04/24/2018     Lab Results  Component Value Date   ALBUMIN 3.0 (L)  12/05/2017   ALBUMIN 3.2 (L) 11/06/2017   ALBUMIN 3.9 06/19/2017   LABURIC 3.7 04/24/2018    Body mass index is 25.3 kg/m.  Orders:  No orders of the defined types were placed in this encounter.  Meds ordered this encounter  Medications  . DISCONTD: sulfamethoxazole-trimethoprim (BACTRIM DS,SEPTRA DS) 800-160 MG tablet    Sig: Take 1 tablet by mouth 2 (two) times daily.    Dispense:  20 tablet    Refill:  0  . sulfamethoxazole-trimethoprim (BACTRIM DS,SEPTRA DS) 800-160 MG tablet    Sig: Take 1 tablet by mouth 2 (two) times daily.    Dispense:  20 tablet    Refill:  0     Procedures: No procedures performed  Clinical Data: No additional findings.  ROS:  All other systems negative, except as noted in the HPI. Review of Systems  Musculoskeletal: Positive for joint swelling.  Skin: Positive for color change and wound.    Objective: Vital Signs: Ht 5\' 3"  (1.6 m)   Wt 142 lb 12.8 oz (64.8 kg)   BMI 25.30 kg/m   Specialty Comments:  No specialty comments available.  PMFS History: Patient Active Problem List   Diagnosis Date Noted  . Rash 11/08/2017  . Myalgia 11/06/2017  . Hypercalcemia 11/06/2017  . Cough 10/09/2017  . Syncope 07/12/2017  .  Chest pain 02/15/2016  . Genetic testing 04/28/2015  . Hypothyroidism 04/06/2015  . Screening examination for infectious disease 04/06/2015  . Wellness examination 09/16/2014  . Lymphedema of upper extremity 07/13/2014  . Lower extremity edema 05/27/2013  . Malignant neoplasm of upper-outer quadrant of right breast in female, estrogen receptor negative ([facility]) 01/01/2013  . Shortness of breath 03/31/2012  . Anxiety and depression 07/06/2011  . Encounter for long-term (current) use of other medications 03/23/2011  . Goiter 03/23/2011  . URTICARIA 03/23/2010  . HYPERCHOLESTEROLEMIA 10/25/2008  . LEG PAIN, RIGHT 02/04/2008  . Type 1 diabetes mellitus ([facility]) 03/20/2007  . ANXIETY 03/20/2007  . DEPRESSION 03/20/2007    Past Medical History:  Diagnosis Date  . Anxiety   . Anxiety and depression 07/06/2011  . Breast cancer ([place]) 01/01/13   /metastatic 1/1 lymph nodes  . Depression   . Diabetes mellitus    IDDM  . Dyslipidemia   . Hx of insertion of insulin pump ~ 2001 to present (06/09/2013)  . Lower extremity edema   . PONV (postoperative nausea and vomiting)   . Type I diabetes mellitus (HCC)    uses insulin pump  . Venous insufficiency     Family History  Problem Relation Age of Onset  . Coronary artery disease Mother   . Congestive Heart Failure Maternal Grandmother   . Brain cancer Maternal Grandfather        dx in his early 8s  . Cancer Neg Hx   . Diabetes Neg Hx     Past Surgical History:  Procedure Laterality Date  . BREAST BIOPSY Right 01/01/13   Invasive mammary ca,metastatic in 1/1 lymph node  . BREAST BIOPSY Left 01/09/13   Fibroadenoma,microcalcifications  . CESAREAN SECTION  1989  . LUMBAR DISC SURGERY  2009   Dr Maxie Better  . MASTECTOMY COMPLETE / SIMPLE Left 06/09/2013  . MASTECTOMY MODIFIED RADICAL Right 06/09/2013  . MASTECTOMY MODIFIED RADICAL Right 06/09/2013   Procedure: RIGHT MASTECTOMY MODIFIED RADICAL;  Surgeon: Adin Hector, MD;  Location: [facility];  Service: General;  Laterality: Right;  . PORTACATH PLACEMENT Left 01/2013  . SIMPLE MASTECTOMY WITH AXILLARY SENTINEL NODE BIOPSY Left 06/09/2013   Procedure: LEFT SIMPLE MASTECTOMY;  Surgeon: Adin Hector, MD;  Location: [facility];  Service: General;  Laterality: Left;  . TUBAL LIGATION  1991   Social History   Occupational History    Employer: [employer]   Tobacco Use  . Smoking status: Never Smoker  . Smokeless tobacco: Never Used  Substance and Sexual Activity  . Alcohol use: No  . Drug use: No  . Sexual activity: Yes

## 2018-05-06 ENCOUNTER — Encounter (HOSPITAL_COMMUNITY): Payer: Self-pay | Admitting: *Deleted

## 2018-05-06 ENCOUNTER — Other Ambulatory Visit: Payer: Self-pay

## 2018-05-06 ENCOUNTER — Ambulatory Visit (INDEPENDENT_AMBULATORY_CARE_PROVIDER_SITE_OTHER): Payer: Self-pay | Admitting: Physician Assistant

## 2018-05-06 ENCOUNTER — Telehealth (INDEPENDENT_AMBULATORY_CARE_PROVIDER_SITE_OTHER): Payer: Self-pay | Admitting: Orthopedic Surgery

## 2018-05-06 NOTE — Telephone Encounter (Signed)
Patient said she was told she would have surgery tomorrow but does not know where or what time, she states she has contacted the hospital and they do not have her on the schedule? She has not had any pre admission appointments. She is very frustrated since she has already asked off of work. She has left a couple of messages and really would like to know something as soon as possible. Patients # [phone]

## 2018-05-06 NOTE — Progress Notes (Addendum)
Mrs Yokley is a Type I diabetic. Patient reports that CBGs have been in the 200's, since she has been taking Prednisone.  I instructed patient to decrease Insulin Pump by 20% at midnight. I instructed patient to check CBG after awaking and every 2 hours until arrival  to the hospital.  I Instructed patient if CBG is less than 70 to drink1/2 cup of a clear juice. Recheck CBG in 15 minutes then call pre- op desk at [phone] for further instructions.   I spoke to Dr Conrad  and he gave an order for NPO after a light breakfast by 0700.  I instructed patient on what a light breakfast consist of.

## 2018-05-06 NOTE — Telephone Encounter (Signed)
I spoke with Jasmine Schwartz.  Surgery is tomorrow 05/07/2018 at [facility]. All surgery information given to patient.

## 2018-05-07 ENCOUNTER — Ambulatory Visit (HOSPITAL_COMMUNITY): Payer: 59 | Admitting: Anesthesiology

## 2018-05-07 ENCOUNTER — Encounter (HOSPITAL_COMMUNITY): Payer: Self-pay

## 2018-05-07 ENCOUNTER — Ambulatory Visit (HOSPITAL_COMMUNITY)
Admission: RE | Admit: 2018-05-07 | Discharge: 2018-05-07 | Disposition: A | Discharge status: Home / Self Care | Payer: 59 | Source: Ambulatory Visit | Attending: Orthopedic Surgery | Admitting: Orthopedic Surgery

## 2018-05-07 ENCOUNTER — Encounter (HOSPITAL_COMMUNITY)
Admission: RE | Disposition: A | Discharge status: Home / Self Care | Payer: Self-pay | Source: Ambulatory Visit | Attending: Orthopedic Surgery

## 2018-05-07 DIAGNOSIS — L98499 Non-pressure chronic ulcer of skin of other sites with unspecified severity: Secondary | ICD-10-CM | POA: Insufficient documentation

## 2018-05-07 DIAGNOSIS — E109 Type 1 diabetes mellitus without complications: Secondary | ICD-10-CM | POA: Diagnosis not present

## 2018-05-07 DIAGNOSIS — E039 Hypothyroidism, unspecified: Secondary | ICD-10-CM | POA: Insufficient documentation

## 2018-05-07 DIAGNOSIS — M71121 Other infective bursitis, right elbow: Secondary | ICD-10-CM | POA: Diagnosis not present

## 2018-05-07 DIAGNOSIS — M7021 Olecranon bursitis, right elbow: Secondary | ICD-10-CM | POA: Insufficient documentation

## 2018-05-07 DIAGNOSIS — Z853 Personal history of malignant neoplasm of breast: Secondary | ICD-10-CM | POA: Insufficient documentation

## 2018-05-07 DIAGNOSIS — E7800 Pure hypercholesterolemia, unspecified: Secondary | ICD-10-CM | POA: Diagnosis not present

## 2018-05-07 DIAGNOSIS — Z794 Long term (current) use of insulin: Secondary | ICD-10-CM | POA: Insufficient documentation

## 2018-05-07 HISTORY — DX: Localized enlarged lymph nodes: R59.0

## 2018-05-07 HISTORY — PX: I & D EXTREMITY: SHX5045

## 2018-05-07 HISTORY — DX: Hypothyroidism, unspecified: E03.9

## 2018-05-07 LAB — BASIC METABOLIC PANEL
ANION GAP: 11 (ref 5–15)
BUN: 22 mg/dL — ABNORMAL HIGH (ref 6–20)
CO2: 23 mmol/L (ref 22–32)
Calcium: 9.3 mg/dL (ref 8.9–10.3)
Chloride: 100 mmol/L (ref 98–111)
Creatinine, Ser: 0.69 mg/dL (ref 0.44–1.00)
GFR calc non Af Amer: >60 mL/min (ref >60)
Glucose, Bld: 216 mg/dL — ABNORMAL HIGH (ref 70–99)
POTASSIUM: 4.2 mmol/L (ref 3.5–5.1)
SODIUM: 134 mmol/L — AB (ref 135–145)

## 2018-05-07 LAB — CBC
HCT: 43.3 % (ref 36.0–46.0)
HEMOGLOBIN: 14.3 g/dL (ref 12.0–15.0)
MCH: 29.9 pg (ref 26.0–34.0)
MCHC: 33 g/dL (ref 30.0–36.0)
MCV: 90.6 fL (ref 78.0–100.0)
PLATELETS: 258 10*3/uL (ref 150–400)
RBC: 4.78 MIL/uL (ref 3.87–5.11)
RDW: 15.3 % (ref 11.5–15.5)
WBC: 6.5 10*3/uL (ref 4.0–10.5)

## 2018-05-07 LAB — HEMOGLOBIN A1C
HEMOGLOBIN A1C: 8.7 % — AB (ref 4.8–5.6)
MEAN PLASMA GLUCOSE: 202.99 mg/dL

## 2018-05-07 LAB — GLUCOSE, CAPILLARY
GLUCOSE-CAPILLARY: 201 mg/dL — AB (ref 70–99)
Glucose-Capillary: 194 mg/dL — ABNORMAL HIGH (ref 70–99)

## 2018-05-07 SURGERY — IRRIGATION AND DEBRIDEMENT EXTREMITY
Anesthesia: Monitor Anesthesia Care | Laterality: Right

## 2018-05-07 MED ORDER — MIDAZOLAM HCL 2 MG/2ML IJ SOLN
INTRAMUSCULAR | Status: AC
  Administered 2018-05-07: 2 mg via INTRAVENOUS
  Filled 2018-05-07: qty 2

## 2018-05-07 MED ORDER — CHLORHEXIDINE GLUCONATE 4 % EX LIQD: 60.0000 mL | Freq: Once | CUTANEOUS | Status: DC

## 2018-05-07 MED ORDER — CEFAZOLIN SODIUM-DEXTROSE 2-4 GM/100ML-% IV SOLN
2.0000 g | INTRAVENOUS | Status: AC
  Administered 2018-05-07: 2 g via INTRAVENOUS

## 2018-05-07 MED ORDER — FENTANYL CITRATE (PF) 100 MCG/2ML IJ SOLN
50.0000 ug | Freq: Once | INTRAMUSCULAR | Status: AC
  Administered 2018-05-07: 50 ug via INTRAVENOUS

## 2018-05-07 MED ORDER — MIDAZOLAM HCL 2 MG/2ML IJ SOLN
2.0000 mg | Freq: Once | INTRAMUSCULAR | Status: AC
  Administered 2018-05-07: 2 mg via INTRAVENOUS

## 2018-05-07 MED ORDER — 0.9 % SODIUM CHLORIDE (POUR BTL) OPTIME
TOPICAL | Status: DC | PRN
  Administered 2018-05-07: 1000 mL

## 2018-05-07 MED ORDER — PROPOFOL 10 MG/ML IV BOLUS
INTRAVENOUS | Status: AC
  Filled 2018-05-07: qty 20

## 2018-05-07 MED ORDER — FENTANYL CITRATE (PF) 100 MCG/2ML IJ SOLN
INTRAMUSCULAR | Status: AC
  Administered 2018-05-07: 50 ug via INTRAVENOUS
  Filled 2018-05-07: qty 2

## 2018-05-07 MED ORDER — HYDROCODONE-ACETAMINOPHEN 5-325 MG PO TABS: 1.0000 | ORAL_TABLET | ORAL | 0 refills | Status: DC | PRN

## 2018-05-07 MED ORDER — HYDROMORPHONE HCL 1 MG/ML IJ SOLN: 0.2500 mg | INTRAMUSCULAR | Status: DC | PRN

## 2018-05-07 MED ORDER — PROPOFOL 10 MG/ML IV BOLUS
INTRAVENOUS | Status: DC | PRN
  Administered 2018-05-07: 30 mg via INTRAVENOUS

## 2018-05-07 MED ORDER — DEXAMETHASONE SODIUM PHOSPHATE 10 MG/ML IJ SOLN
INTRAMUSCULAR | Status: AC
  Filled 2018-05-07: qty 1

## 2018-05-07 MED ORDER — MIDAZOLAM HCL 2 MG/2ML IJ SOLN
INTRAMUSCULAR | Status: AC
  Filled 2018-05-07: qty 2

## 2018-05-07 MED ORDER — LIDOCAINE 2% (20 MG/ML) 5 ML SYRINGE
INTRAMUSCULAR | Status: AC
  Filled 2018-05-07: qty 15

## 2018-05-07 MED ORDER — LACTATED RINGERS IV SOLN
INTRAVENOUS | Status: DC
  Administered 2018-05-07: 12:00:00 via INTRAVENOUS

## 2018-05-07 MED ORDER — ONDANSETRON HCL 4 MG/2ML IJ SOLN
INTRAMUSCULAR | Status: AC
  Filled 2018-05-07: qty 2

## 2018-05-07 MED ORDER — ONDANSETRON HCL 4 MG/2ML IJ SOLN
INTRAMUSCULAR | Status: DC | PRN
  Administered 2018-05-07: 4 mg via INTRAVENOUS

## 2018-05-07 MED ORDER — BUPIVACAINE-EPINEPHRINE (PF) 0.5% -1:200000 IJ SOLN
INTRAMUSCULAR | Status: DC | PRN
  Administered 2018-05-07: 30 mL via PERINEURAL

## 2018-05-07 MED ORDER — PROPOFOL 500 MG/50ML IV EMUL
INTRAVENOUS | Status: DC | PRN
  Administered 2018-05-07: 50 ug/kg/min via INTRAVENOUS

## 2018-05-07 MED ORDER — MIDAZOLAM HCL 5 MG/5ML IJ SOLN
INTRAMUSCULAR | Status: DC | PRN
  Administered 2018-05-07: 2 mg via INTRAVENOUS

## 2018-05-07 MED ORDER — FENTANYL CITRATE (PF) 250 MCG/5ML IJ SOLN
INTRAMUSCULAR | Status: AC
  Filled 2018-05-07: qty 5

## 2018-05-07 SURGICAL SUPPLY — 30 items
BLADE SURG 21 STRL SS (BLADE) ×2 IMPLANT
BNDG COHESIVE 4X5 TAN STRL (GAUZE/BANDAGES/DRESSINGS) ×1 IMPLANT
BNDG COHESIVE 6X5 TAN STRL LF (GAUZE/BANDAGES/DRESSINGS) IMPLANT
BNDG GAUZE ELAST 4 BULKY (GAUZE/BANDAGES/DRESSINGS) ×4 IMPLANT
COVER SURGICAL LIGHT HANDLE (MISCELLANEOUS) ×4 IMPLANT
DRAPE U-SHAPE 47X51 STRL (DRAPES) ×2 IMPLANT
DRSG ADAPTIC 3X8 NADH LF (GAUZE/BANDAGES/DRESSINGS) ×2 IMPLANT
DURAPREP 26ML APPLICATOR (WOUND CARE) ×2 IMPLANT
ELECT REM PT RETURN 9FT ADLT (ELECTROSURGICAL)
ELECTRODE REM PT RTRN 9FT ADLT (ELECTROSURGICAL) IMPLANT
GAUZE SPONGE 4X4 12PLY STRL (GAUZE/BANDAGES/DRESSINGS) ×2 IMPLANT
GLOVE BIOGEL PI IND STRL 9 (GLOVE) ×1 IMPLANT
GLOVE BIOGEL PI INDICATOR 9 (GLOVE) ×1
GLOVE SURG ORTHO 9.0 STRL STRW (GLOVE) ×2 IMPLANT
GOWN STRL REUS W/ TWL XL LVL3 (GOWN DISPOSABLE) ×2 IMPLANT
GOWN STRL REUS W/TWL XL LVL3 (GOWN DISPOSABLE) ×4
HANDPIECE INTERPULSE COAX TIP (DISPOSABLE)
KIT BASIN OR (CUSTOM PROCEDURE TRAY) ×2 IMPLANT
KIT TURNOVER KIT B (KITS) ×2 IMPLANT
MANIFOLD NEPTUNE II (INSTRUMENTS) ×2 IMPLANT
NS IRRIG 1000ML POUR BTL (IV SOLUTION) ×2 IMPLANT
PACK ORTHO EXTREMITY (CUSTOM PROCEDURE TRAY) ×2 IMPLANT
PAD ARMBOARD 7.5X6 YLW CONV (MISCELLANEOUS) ×4 IMPLANT
SET HNDPC FAN SPRY TIP SCT (DISPOSABLE) IMPLANT
STOCKINETTE IMPERVIOUS 9X36 MD (GAUZE/BANDAGES/DRESSINGS) IMPLANT
SWAB COLLECTION DEVICE MRSA (MISCELLANEOUS) ×2 IMPLANT
SWAB CULTURE ESWAB REG 1ML (MISCELLANEOUS) IMPLANT
TOWEL OR 17X26 10 PK STRL BLUE (TOWEL DISPOSABLE) ×2 IMPLANT
TUBE CONNECTING 12X1/4 (SUCTIONS) ×2 IMPLANT
YANKAUER SUCT BULB TIP NO VENT (SUCTIONS) ×2 IMPLANT

## 2018-05-07 NOTE — Op Note (Addendum)
05/07/2018  3:24 PM  PATIENT:  Oneal Deputy    PRE-OPERATIVE DIAGNOSIS:  Septic olecranon bursitis right elbow  POST-OPERATIVE DIAGNOSIS:  Same  PROCEDURE:  BURSECTOMY RIGHT ELBOW, application of Praveena wound VAC and extension splint, local tissue rearrangement for wound closure 3 x 7 cm.  SURGEON:  Newt Minion, MD  PHYSICIAN ASSISTANT:None ANESTHESIA:   General  PREOPERATIVE INDICATIONS:  CHARMIN AGUINIGA is a  52 y.o. female with a diagnosis of Septic Bursitis who failed conservative measures and elected for surgical management.    The risks benefits and alternatives were discussed with the patient preoperatively including but not limited to the risks of infection, bleeding, nerve injury, cardiopulmonary complications, the need for revision surgery, among others, and the patient was willing to proceed.  OPERATIVE IMPLANTS: Praveena wound VAC  @ENCIMAGES @  OPERATIVE FINDINGS: Tissue sent for cultures.  Patient has been on initially doxycycline and most recently Bactrim DS  OPERATIVE PROCEDURE: Patient was brought to the operating room after undergoing a interscalene block.  After adequate levels of anesthesia were obtained patient's right upper extremity was prepped using DuraPrep draped into a sterile field a timeout was called.  An elliptical incision was made around the ulcerative tissue this was a midline posterior incision.  This was carried down to the olecranon bursa which was excised.  The fascia covering the ulnar nerve was not disrupted.  The wound was irrigated with normal saline all bursa tissue was sent for cultures.  The wound was irrigated with normal saline.  Local tissue rearrangement was used to close the wound that was 3 x 7 cm.  A Praveena wound VAC was applied 13 cm long this had a good suction fit a splint was applied posteriorly to keep the elbow in extension web roll was wrapped over the wound VAC dressing the extension splint was secured with Covan.  Patient was  taken to the PACU in stable condition.   DISCHARGE PLANNING:  Antibiotic duration: Patient currently on Bactrim DS preoperative received Kefzol.  She will continue with her Bactrim DS pending culture results.  Weightbearing: Nonweightbearing right upper extremity  Pain medication: Prescription for Vicodin  Dressing care/ Wound VAC: Continue wound VAC for 1 week  Ambulatory devices: Not applicable  Discharge to: Home  Follow-up: In the office 1 week post operative.

## 2018-05-07 NOTE — Anesthesia Procedure Notes (Signed)
Anesthesia Regional Block: Supraclavicular block   Pre-Anesthetic Checklist: ,, timeout performed, Correct Patient, Correct Site, Correct Laterality, Correct Procedure, Correct Position, site marked, Risks and benefits discussed, pre-op evaluation,  At surgeon's request and post-op pain management  Laterality: Right  Prep: Maximum Sterile Barrier Precautions used, Betadine       Needles:  Injection technique: Single-shot  Needle Type: Other      Needle Gauge: 22     Additional Needles:   Procedures:, nerve stimulator,,,,,,,  Narrative:  Start time: 05/07/2018 1:10 PM End time: 05/07/2018 1:20 PM Injection made incrementally with aspirations every 5 mL.  Additional Notes: 2% Lidocaine skin wheel.

## 2018-05-07 NOTE — Anesthesia Procedure Notes (Signed)
Procedure Name: MAC Date/Time: 05/07/2018 2:48 PM Performed by: Lance Coon, CRNA Pre-anesthesia Checklist: Patient identified, Emergency Drugs available, Suction available, Patient being monitored and Timeout performed Patient Re-evaluated:Patient Re-evaluated prior to induction Oxygen Delivery Method: Simple face mask

## 2018-05-07 NOTE — Anesthesia Preprocedure Evaluation (Addendum)
Anesthesia Evaluation  Patient identified by MRN, date of birth, ID band Patient awake    Reviewed: Allergy & Precautions, H&P , NPO status , Patient's Chart, lab work & pertinent test results  History of Anesthesia Complications (+) PONV  Airway Mallampati: II  TM Distance: >3 FB Neck ROM: Full    Dental no notable dental hx. (+) Teeth Intact, Dental Advisory Given   Pulmonary neg pulmonary ROS,    Pulmonary exam normal breath sounds clear to auscultation       Cardiovascular negative cardio ROS   Rhythm:Regular Rate:Normal     Neuro/Psych Anxiety Depression negative neurological ROS     GI/Hepatic negative GI ROS, Neg liver ROS,   Endo/Other  diabetes, Type 1, Insulin DependentHypothyroidism   Renal/GU negative Renal ROS  negative genitourinary   Musculoskeletal   Abdominal   Peds  Hematology negative hematology ROS (+)   Anesthesia Other Findings   Reproductive/Obstetrics negative OB ROS                            Anesthesia Physical Anesthesia Plan  ASA: III  Anesthesia Plan: MAC and Regional   Post-op Pain Management:    Induction: Intravenous  PONV Risk Score and Plan: 4 or greater and Ondansetron, Midazolam and Propofol infusion  Airway Management Planned: Simple Face Mask  Additional Equipment:   Intra-op Plan:   Post-operative Plan:   Informed Consent: I have reviewed the patients History and Physical, chart, labs and discussed the procedure including the risks, benefits and alternatives for the proposed anesthesia with the patient or authorized representative who has indicated his/her understanding and acceptance.   Dental advisory given  Plan Discussed with: CRNA  Anesthesia Plan Comments:        Anesthesia Quick Evaluation

## 2018-05-07 NOTE — Transfer of Care (Signed)
Immediate Anesthesia Transfer of Care Note  Patient: Jasmine Schwartz  Procedure(s) Performed: BURSECTOMY RIGHT ELBOW (Right )  Patient Location: PACU  Anesthesia Type:MAC and Regional  Level of Consciousness: awake, alert  and oriented  Airway & Oxygen Therapy: Patient Spontanous Breathing  Post-op Assessment: Report given to RN and Post -op Vital signs reviewed and stable  Post vital signs: Reviewed and stable  Last Vitals:  Vitals Value Taken Time  BP 98/45 05/07/2018  3:25 PM  Temp    Pulse 85 05/07/2018  3:28 PM  Resp 13 05/07/2018  3:28 PM  SpO2 88 % 05/07/2018  3:28 PM  Vitals shown include unvalidated device data.  Last Pain:  Vitals:   05/07/18 1210  TempSrc:   PainSc: 0-No pain         Complications: No apparent anesthesia complications

## 2018-05-07 NOTE — H&P (Signed)
Jasmine Schwartz is an 52 y.o. female.   Chief Complaint: Right elbow abscess and ulceration HPI: Patient is a 52 year old woman with a 30-year history of type 1 diabetes.  She states her hemoglobin A1c runs around 7.9.  Patient states she has had several weeks of swelling over the right olecranon bursa.    Today is seen in follow up for MRI review of right elbow. Has been doing about the same. Less drainage. Still erythema and swelling. No fever or chills.   Began   Past Medical History:  Diagnosis Date  . Anxiety   . Anxiety and depression 07/06/2011  . Breast cancer ([facility]) 01/01/13   /metastatic 1/1 lymph nodes, chemo, radiation, surgery  . Depression   . Diabetes mellitus    Type I  . Dyslipidemia   . Hypothyroidism   . Lower extremity edema   . Lymphadenopathy, supraclavicular   . PONV (postoperative nausea and vomiting)   . Type I diabetes mellitus (HCC)    uses insulin pump  . Venous insufficiency     Past Surgical History:  Procedure Laterality Date  . BREAST BIOPSY Right 01/01/13   Invasive mammary ca,metastatic in 1/1 lymph node  . BREAST BIOPSY Left 01/09/13   Fibroadenoma,microcalcifications  . CESAREAN SECTION  1989  . LUMBAR DISC SURGERY  2009   Dr Maxie Better  . MASTECTOMY COMPLETE / SIMPLE Left 06/09/2013  . MASTECTOMY MODIFIED RADICAL Right 06/09/2013  . MASTECTOMY MODIFIED RADICAL Right 06/09/2013   Procedure: RIGHT MASTECTOMY MODIFIED RADICAL;  Surgeon: Adin Hector, MD;  Location: [facility];  Service: General;  Laterality: Right;  . PORTACATH PLACEMENT Left 01/2013  . SIMPLE MASTECTOMY WITH AXILLARY SENTINEL NODE BIOPSY Left 06/09/2013   Procedure: LEFT SIMPLE MASTECTOMY;  Surgeon: Adin Hector, MD;  Location: [facility];  Service: General;  Laterality: Left;  . TUBAL LIGATION  1991    Family History  Problem Relation Age of Onset  . Coronary artery disease Mother   . Congestive Heart Failure Maternal Grandmother   . Brain cancer Maternal Grandfather        dx  in his early 71s  . Cancer Neg Hx   . Diabetes Neg Hx    Social History:  reports that she has never smoked. She has never used smokeless tobacco. She reports that she does not drink alcohol or use drugs.  Allergies:  Allergies  Allergen Reactions  . Doxycycline Rash    No medications prior to admission.    No results found for this or any previous visit (from the past 48 hour(s)). No results found.  Review of Systems  All other systems reviewed and are negative.   There were no vitals taken for this visit. Physical Exam  Patient is alert, oriented, no adenopathy, well-dressed, normal affect, normal respiratory effort. Examination has moderate swelling the right upper extremity and elbow. This is indurated and erythematous. no ascending cellulitis.  She has a wound which is 5 mm in diameter over the olecranon bursa.  There is no purulence. no odor.  Assessment/Plan Assessment: Ulceration with olecranon bursitis right elbow without MRI findings of osteomyelitis.  Plan.  We will plan for excision of the olecranon bursa irrigation and debridement and placement of a splint and incisional wound VAC.  Risk and benefits were discussed including persistent infection need for additional surgery neurovascular injury.  Patient states she understands wished to proceed at this time.  Newt Minion, MD 05/07/2018, 7:08 AM

## 2018-05-08 ENCOUNTER — Encounter (HOSPITAL_COMMUNITY): Payer: Self-pay | Admitting: Orthopedic Surgery

## 2018-05-08 NOTE — Anesthesia Postprocedure Evaluation (Signed)
Anesthesia Post Note  Patient: Jasmine Schwartz  Procedure(s) Performed: BURSECTOMY RIGHT ELBOW (Right )     Patient location during evaluation: PACU Anesthesia Type: Regional Level of consciousness: awake and alert Pain management: pain level controlled Vital Signs Assessment: post-procedure vital signs reviewed and stable Respiratory status: spontaneous breathing, nonlabored ventilation, respiratory function stable and patient connected to nasal cannula oxygen Cardiovascular status: stable and blood pressure returned to baseline Postop Assessment: no apparent nausea or vomiting Anesthetic complications: no    Last Vitals:  Vitals:   05/07/18 1540 05/07/18 1555  BP: 120/73 117/62  Pulse: 91 87  Resp: 14 13  Temp:  36.6 C  SpO2: 100% 100%    Last Pain:  Vitals:   05/07/18 1555  TempSrc:   PainSc: 0-No pain                 Lidia Collum

## 2018-05-12 ENCOUNTER — Encounter (INDEPENDENT_AMBULATORY_CARE_PROVIDER_SITE_OTHER): Payer: Self-pay | Admitting: Physician Assistant

## 2018-05-12 ENCOUNTER — Ambulatory Visit (INDEPENDENT_AMBULATORY_CARE_PROVIDER_SITE_OTHER): Payer: 59 | Admitting: Physician Assistant

## 2018-05-12 DIAGNOSIS — C50411 Malignant neoplasm of upper-outer quadrant of right female breast: Secondary | ICD-10-CM

## 2018-05-12 DIAGNOSIS — Z171 Estrogen receptor negative status [ER-]: Secondary | ICD-10-CM

## 2018-05-12 DIAGNOSIS — E1065 Type 1 diabetes mellitus with hyperglycemia: Secondary | ICD-10-CM

## 2018-05-12 DIAGNOSIS — M71121 Other infective bursitis, right elbow: Secondary | ICD-10-CM

## 2018-05-12 DIAGNOSIS — I890 Lymphedema, not elsewhere classified: Secondary | ICD-10-CM

## 2018-05-12 NOTE — Progress Notes (Signed)
Office Visit Note   Patient: Jasmine Schwartz           Date of Birth: 1966-06-18           MRN: [id number] Visit Date: 05/12/2018              Requested by: [name], MD [address] [place], [place] [address] PCP: [name], MD  Chief Complaint  Patient presents with  . Right Elbow - Pain      HPI: Patient is a 52 year old female who is a type I diabetic who is seen for postoperative follow-up following irrigation debridements and excision of her right elbow olecranon bursa.  Operative cultures have shown no growth and Gram stain showed no organism no white cells.  She reports little to no pain following the procedure.  She is only taken a couple of stronger pain medications but primarily has been taking ibuprofen.  She took her last antibiotic last night, Bactrim.  She has had the Praveena wound VAC on the area but it only has a couple of days therapy left and it was removed today.  She reports she is pleased with her progress.  She does have some mild chronic lymphedema of the right upper extremity following lumpectomy and treatment for breast cancer on the right.  Assessment & Plan: Visit Diagnoses:  1. Septic bursitis of elbow, right   2. Type 1 diabetes mellitus with hyperglycemia (HCC)   3. Malignant neoplasm of upper-outer quadrant of right breast in female, estrogen receptor negative ([facility])   4. Lymphedema of upper extremity     Plan: The Praveena VAC was removed.  We are going to utilize a ABD pad to the incisional area, kerlix to secure,and the posterior splint was replaced and secured with an Ace wrap. They will continue the posterior splint .  They will redress the area twice daily initially and keep the arm elevated as much as possible.  She will follow-up in 1 week.  Follow-Up Instructions: Return in about 1 week (around 05/19/2018).   Ortho Exam  Patient is alert, oriented, no adenopathy, well-dressed, normal affect, normal respiratory  effort. The Praveena VAC was removed.  The elbow incision has sutures intact and is without signs of cellulitis.  There is some scant serosanguineous drainage once the Praveena VAC was removed but it is very scant.  There is no peripheral edema.  There is minimal and she is neurovascularly intact distally.  Imaging: No results found. No images are attached to the encounter.  Labs: Lab Results  Component Value Date   HGBA1C 8.7 (H) 05/07/2018   HGBA1C 7.9 (A) 02/27/2018   HGBA1C 8.7 11/06/2017   ESRSEDRATE 58 (H) 12/05/2017   ESRSEDRATE 44 (H) 11/06/2017   ESRSEDRATE 33 (H) 02/04/2008   CRP <0.8 12/05/2017   LABURIC 3.7 04/24/2018   REPTSTATUS PENDING 05/07/2018   GRAMSTAIN NO WBC SEEN NO ORGANISMS SEEN  05/07/2018   CULT  05/07/2018    NO GROWTH 4 DAYS NO ANAEROBES ISOLATED; CULTURE IN PROGRESS FOR 5 DAYS Performed at [facility], [place] [address], [place], [place] [address]      Lab Results  Component Value Date   ALBUMIN 3.0 (L) 12/05/2017   ALBUMIN 3.2 (L) 11/06/2017   ALBUMIN 3.9 06/19/2017   LABURIC 3.7 04/24/2018    There is no height or weight on file to calculate BMI.  Orders:  No orders of the defined types were placed in this encounter.  No orders of the defined types were placed in this encounter.    Procedures: No procedures performed  Clinical Data: No additional findings.  ROS:  All other systems negative, except as noted in the HPI. Review of Systems  Objective: Vital Signs: There were no vitals taken for this visit.  Specialty Comments:  No specialty comments available.  PMFS History: Patient Active Problem List   Diagnosis Date Noted  . Septic olecranon bursitis, right   . Rash 11/08/2017  . Myalgia 11/06/2017  . Hypercalcemia 11/06/2017  . Cough 10/09/2017  . Syncope 07/12/2017  . Chest pain 02/15/2016  . Genetic testing 04/28/2015  . Hypothyroidism 04/06/2015  . Screening examination for infectious disease 04/06/2015   . Wellness examination 09/16/2014  . Lymphedema of upper extremity 07/13/2014  . Lower extremity edema 05/27/2013  . Malignant neoplasm of upper-outer quadrant of right breast in female, estrogen receptor negative ([facility]) 01/01/2013  . Shortness of breath 03/31/2012  . Anxiety and depression 07/06/2011  . Encounter for long-term (current) use of other medications 03/23/2011  . Goiter 03/23/2011  . URTICARIA 03/23/2010  . HYPERCHOLESTEROLEMIA 10/25/2008  . LEG PAIN, RIGHT 02/04/2008  . Type 1 diabetes mellitus ([facility]) 03/20/2007  . ANXIETY 03/20/2007  . DEPRESSION 03/20/2007   Past Medical History:  Diagnosis Date  . Anxiety   . Anxiety and depression 07/06/2011  . Breast cancer ([facility]) 01/01/13   /metastatic 1/1 lymph nodes, chemo, radiation, surgery  . Depression   . Diabetes mellitus    Type I  . Dyslipidemia   . Hypothyroidism   . Lower extremity edema   . Lymphadenopathy, supraclavicular   . PONV (postoperative nausea and vomiting)   . Type I diabetes mellitus (HCC)    uses insulin pump  . Venous insufficiency     Family History  Problem Relation Age of Onset  . Coronary artery disease Mother   . Congestive Heart Failure Maternal Grandmother   . Brain cancer Maternal Grandfather        dx in his early 41s  . Cancer Neg Hx   . Diabetes Neg Hx     Past Surgical History:  Procedure Laterality Date  . BREAST BIOPSY Right 01/01/13   Invasive mammary ca,metastatic in 1/1 lymph node  . BREAST BIOPSY Left 01/09/13   Fibroadenoma,microcalcifications  . CESAREAN SECTION  1989  . I&D EXTREMITY Right 05/07/2018   Procedure: BURSECTOMY RIGHT ELBOW;  Surgeon: Newt Minion, MD;  Location: [facility];  Service: Orthopedics;  Laterality: Right;  . LUMBAR DISC SURGERY  2009   Dr Maxie Better  . MASTECTOMY COMPLETE / SIMPLE Left 06/09/2013  . MASTECTOMY MODIFIED RADICAL Right 06/09/2013  . MASTECTOMY MODIFIED RADICAL Right 06/09/2013   Procedure: RIGHT MASTECTOMY MODIFIED RADICAL;  Surgeon:  Adin Hector, MD;  Location: [facility];  Service: General;  Laterality: Right;  . PORTACATH PLACEMENT Left 01/2013  . SIMPLE MASTECTOMY WITH AXILLARY SENTINEL NODE BIOPSY Left 06/09/2013   Procedure: LEFT SIMPLE MASTECTOMY;  Surgeon: Adin Hector, MD;  Location: [facility];  Service: General;  Laterality: Left;  . TUBAL LIGATION  1991   Social History   Occupational History    Employer: [employer]   Tobacco Use  . Smoking status: Never Smoker  . Smokeless tobacco: Never Used  Substance and Sexual Activity  . Alcohol use: No  . Drug use: No  . Sexual activity: Yes

## 2018-05-13 LAB — AEROBIC/ANAEROBIC CULTURE W GRAM STAIN (SURGICAL/DEEP WOUND)
Culture: NO GROWTH
Gram Stain: NONE SEEN

## 2018-05-13 LAB — AEROBIC/ANAEROBIC CULTURE (SURGICAL/DEEP WOUND)

## 2018-05-19 ENCOUNTER — Ambulatory Visit (INDEPENDENT_AMBULATORY_CARE_PROVIDER_SITE_OTHER): Payer: 59 | Admitting: Physician Assistant

## 2018-05-19 ENCOUNTER — Encounter (INDEPENDENT_AMBULATORY_CARE_PROVIDER_SITE_OTHER): Payer: Self-pay | Admitting: Physician Assistant

## 2018-05-19 VITALS — Ht 64.0 in | Wt 141.0 lb

## 2018-05-19 DIAGNOSIS — E1065 Type 1 diabetes mellitus with hyperglycemia: Secondary | ICD-10-CM

## 2018-05-19 DIAGNOSIS — Z171 Estrogen receptor negative status [ER-]: Secondary | ICD-10-CM

## 2018-05-19 DIAGNOSIS — I890 Lymphedema, not elsewhere classified: Secondary | ICD-10-CM

## 2018-05-19 DIAGNOSIS — C50411 Malignant neoplasm of upper-outer quadrant of right female breast: Secondary | ICD-10-CM

## 2018-05-19 DIAGNOSIS — M71121 Other infective bursitis, right elbow: Secondary | ICD-10-CM

## 2018-05-19 NOTE — Progress Notes (Signed)
Office Visit Note   Patient: Jasmine Schwartz           Date of Birth: [birth year]           MRN: [id number] Visit Date: 05/19/2018              Requested by: [name], MD [address] [place], [place] [address] PCP: [name], MD  Chief Complaint  Patient presents with  . Right Elbow - Routine Post Op      HPI: Patient is a 52 year old female who is seen 2 and half weeks postop following irrigation and debridement and excision of her right elbow olecranon bursa.  She reports she has been doing well at home and has little pain in the area.  Operative cultures showed no growth.  She completed her course of antibiotics.  She does have a history of some mild chronic lymphedema in her right upper extremity following a right breast lumpectomy and treatment for breast cancer on the right.  Assessment & Plan: Visit Diagnoses:  1. Septic bursitis of elbow, right   2. Type 1 diabetes mellitus with hyperglycemia (HCC)   3. 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 Lymphedema of upper extremity     Plan: Sutures were removed from the right elbow.  She is going to utilize an Ace wrap to the right upper extremity until we can obtain a lymphedema sleeve for her.  She can start some active range of motion with the Ace wrap or lymphedema sleeve on.  She will follow-up in 2 weeks or sooner should she have difficulties in the interim.  Follow-Up Instructions: Return in about 2 weeks (around 06/02/2018).   Ortho Exam  Patient is alert, oriented, no adenopathy, well-dressed, normal affect, normal respiratory effort. Sutures were harvested this visit.  The incision is clean dry and intact.  No signs of cellulitis.  No edema today of the right upper extremity with the consistent Ace wrapping.  Imaging: No results found. No images are attached to the encounter.  Labs: Lab Results  Component Value Date   HGBA1C  8.7 (H) 05/07/2018   HGBA1C 7.9 (A) 02/27/2018   HGBA1C 8.7 11/06/2017   ESRSEDRATE 58 (H) 12/05/2017   ESRSEDRATE 44 (H) 11/06/2017   ESRSEDRATE 33 (H) 02/04/2008   CRP <0.8 12/05/2017   LABURIC 3.7 04/24/2018   REPTSTATUS 05/13/2018 FINAL 05/07/2018   GRAMSTAIN NO WBC SEEN NO ORGANISMS SEEN  05/07/2018   CULT  05/07/2018    No growth aerobically or anaerobically. Performed at [facility], [place] [address], [place], [place] [address]      Lab Results  Component Value Date   ALBUMIN 3.0 (L) 12/05/2017   ALBUMIN 3.2 (L) 11/06/2017   ALBUMIN 3.9 06/19/2017   LABURIC 3.7 04/24/2018    Body mass index is 24.2 kg/m.  Orders:  No orders of the defined types were placed in this encounter.  No orders of the defined types were placed in this encounter.    Procedures: No procedures performed  Clinical Data: No additional findings.  ROS:  All other systems negative, except as noted in the HPI. Review of Systems  Objective: Vital Signs: Ht 5\' 4"  (1.626 m)   Wt 141 lb (64 kg)   BMI 24.20 kg/m   Specialty Comments:  No specialty comments available.  PMFS History: Patient Active Problem List   Diagnosis Date Noted  . Septic  olecranon bursitis, right   . Rash 11/08/2017  . Myalgia 11/06/2017  . Hypercalcemia 11/06/2017  . Cough 10/09/2017  . Syncope 07/12/2017  . Chest pain 02/15/2016  . Genetic testing 04/28/2015  . Hypothyroidism 04/06/2015  . Screening examination for infectious disease 04/06/2015  . Wellness examination 09/16/2014  . Lymphedema of upper extremity 07/13/2014  . Lower extremity edema 05/27/2013  . Malignant neoplasm of upper-outer quadrant of right breast in female, estrogen receptor negative ([place]) 01/01/2013  . Shortness of breath 03/31/2012  . Anxiety and depression 07/06/2011  . Encounter for long-term (current) use of other medications 03/23/2011  . Goiter 03/23/2011  . URTICARIA 03/23/2010  . HYPERCHOLESTEROLEMIA 10/25/2008    . LEG PAIN, RIGHT 02/04/2008  . Type 1 diabetes mellitus ([place]) 03/20/2007  . ANXIETY 03/20/2007  . DEPRESSION 03/20/2007   Past Medical History:  Diagnosis Date  . Anxiety   . Anxiety and depression 07/06/2011  . Breast cancer ([place]) 01/01/13   /metastatic 1/1 lymph nodes, chemo, radiation, surgery  . Depression   . Diabetes mellitus    Type I  . Dyslipidemia   . Hypothyroidism   . Lower extremity edema   . Lymphadenopathy, supraclavicular   . PONV (postoperative nausea and vomiting)   . Type I diabetes mellitus (HCC)    uses insulin pump  . Venous insufficiency     Family History  Problem Relation Age of Onset  . Coronary artery disease Mother   . Congestive Heart Failure Maternal Grandmother   . Brain cancer Maternal Grandfather        dx in his early 29s  . Cancer Neg Hx   . Diabetes Neg Hx     Past Surgical History:  Procedure Laterality Date  . BREAST BIOPSY Right 01/01/13   Invasive mammary ca,metastatic in 1/1 lymph node  . BREAST BIOPSY Left 01/09/13   Fibroadenoma,microcalcifications  . CESAREAN SECTION  1989  . I&D EXTREMITY Right 05/07/2018   Procedure: BURSECTOMY RIGHT ELBOW;  Surgeon: Newt Minion, MD;  Location: [facility];  Service: Orthopedics;  Laterality: Right;  . LUMBAR DISC SURGERY  2009   Dr Maxie Better  . MASTECTOMY COMPLETE / SIMPLE Left 06/09/2013  . MASTECTOMY MODIFIED RADICAL Right 06/09/2013  . MASTECTOMY MODIFIED RADICAL Right 06/09/2013   Procedure: RIGHT MASTECTOMY MODIFIED RADICAL;  Surgeon: Adin Hector, MD;  Location: [facility];  Service: General;  Laterality: Right;  . PORTACATH PLACEMENT Left 01/2013  . SIMPLE MASTECTOMY WITH AXILLARY SENTINEL NODE BIOPSY Left 06/09/2013   Procedure: LEFT SIMPLE MASTECTOMY;  Surgeon: Adin Hector, MD;  Location: [facility];  Service: General;  Laterality: Left;  . TUBAL LIGATION  1991   Social History   Occupational History    Employer: [employer]   Tobacco Use  . Smoking status: Never Smoker  .  Smokeless tobacco: Never Used  Substance and Sexual Activity  . Alcohol use: No  . Drug use: No  . Sexual activity: Yes

## 2018-05-20 ENCOUNTER — Telehealth (INDEPENDENT_AMBULATORY_CARE_PROVIDER_SITE_OTHER): Payer: Self-pay

## 2018-05-20 NOTE — Telephone Encounter (Signed)
Lm on vm to advise that arm sleeve is available for pick up at the front desk.

## 2018-05-22 ENCOUNTER — Ambulatory Visit (INDEPENDENT_AMBULATORY_CARE_PROVIDER_SITE_OTHER): Payer: 59 | Admitting: Orthopedic Surgery

## 2018-05-22 ENCOUNTER — Encounter (INDEPENDENT_AMBULATORY_CARE_PROVIDER_SITE_OTHER): Payer: Self-pay | Admitting: Orthopedic Surgery

## 2018-05-22 VITALS — Ht 64.0 in | Wt 141.0 lb

## 2018-05-22 DIAGNOSIS — M71121 Other infective bursitis, right elbow: Secondary | ICD-10-CM

## 2018-05-22 NOTE — Progress Notes (Signed)
Office Visit Note   Patient: Jasmine Schwartz           Date of Birth: 05-04-1966           MRN: [id number] Visit Date: 05/22/2018              Requested by: [name], MD [address] [place], [place] [address] PCP: [name], MD  Chief Complaint  Patient presents with  . Right Elbow - Wound Check, Follow-up    Incision site reopened      HPI: Patient is status post resection septic bursitis she has a little bit of dehiscence of the surgical incision.  Assessment & Plan: Visit Diagnoses: No diagnosis found.  Plan: Patient is given a medical sleeve to wear she will apply some Pluragel over the wound to keep it moist.  Follow-Up Instructions: No follow-ups on file.   Ortho Exam  Patient is alert, oriented, no adenopathy, well-dressed, normal affect, normal respiratory effort. Examination patient has no pain with range of motion of her elbow there is no redness no cellulitis no odor no drainage no signs of infection the wound is gaped open 3 x 5 mm and 1 mm deep there is healthy granulation tissue at the base.  Imaging: No results found.    Labs: Lab Results  Component Value Date   HGBA1C 8.7 (H) 05/07/2018   HGBA1C 7.9 (A) 02/27/2018   HGBA1C 8.7 11/06/2017   ESRSEDRATE 58 (H) 12/05/2017   ESRSEDRATE 44 (H) 11/06/2017   ESRSEDRATE 33 (H) 02/04/2008   CRP <0.8 12/05/2017   LABURIC 3.7 04/24/2018   REPTSTATUS 05/13/2018 FINAL 05/07/2018   GRAMSTAIN NO WBC SEEN NO ORGANISMS SEEN  05/07/2018   CULT  05/07/2018    No growth aerobically or anaerobically. Performed at [facility], [place] [address], [place], [place] [address]      Lab Results  Component Value Date   ALBUMIN 3.0 (L) 12/05/2017   ALBUMIN 3.2 (L) 11/06/2017   ALBUMIN 3.9 06/19/2017   LABURIC 3.7 04/24/2018    Body mass index is 24.2 kg/m.  Orders:  No orders of the defined types were placed in this encounter.  No orders of the defined types were placed in this  encounter.    Procedures: No procedures performed  Clinical Data: No additional findings.  ROS:  All other systems negative, except as noted in the HPI. Review of Systems  Objective: Vital Signs: Ht 5\' 4"  (1.626 m)   Wt 141 lb (64 kg)   BMI 24.20 kg/m   Specialty Comments:  No specialty comments available.  PMFS History: Patient Active Problem List   Diagnosis Date Noted  . Septic olecranon bursitis, right   . Rash 11/08/2017  . Myalgia 11/06/2017  . Hypercalcemia 11/06/2017  . Cough 10/09/2017  . Syncope 07/12/2017  . Chest pain 02/15/2016  . Genetic testing 04/28/2015  . Hypothyroidism 04/06/2015  . Screening examination for infectious disease 04/06/2015  . Wellness examination 09/16/2014  . Lymphedema of upper extremity 07/13/2014  . Lower extremity edema 05/27/2013  . Malignant neoplasm of upper-outer quadrant of right breast in female, estrogen receptor negative ([place]) 01/01/2013  . Shortness of breath 03/31/2012  . Anxiety and depression 07/06/2011  . Encounter for long-term (current) use of other medications 03/23/2011  . Goiter 03/23/2011  . URTICARIA 03/23/2010  . HYPERCHOLESTEROLEMIA 10/25/2008  . LEG PAIN, RIGHT 02/04/2008  . Type 1 diabetes mellitus ([place]) 03/20/2007  . ANXIETY 03/20/2007  . DEPRESSION  03/20/2007   Past Medical History:  Diagnosis Date  . Anxiety   . Anxiety and depression 07/06/2011  . Breast cancer ([place]) 01/01/13   /metastatic 1/1 lymph nodes, chemo, radiation, surgery  . Depression   . Diabetes mellitus    Type I  . Dyslipidemia   . Hypothyroidism   . Lower extremity edema   . Lymphadenopathy, supraclavicular   . PONV (postoperative nausea and vomiting)   . Type I diabetes mellitus (HCC)    uses insulin pump  . Venous insufficiency     Family History  Problem Relation Age of Onset  . Coronary artery disease Mother   . Congestive Heart Failure Maternal Grandmother   . Brain cancer Maternal Grandfather        dx in  his early 3s  . Cancer Neg Hx   . Diabetes Neg Hx     Past Surgical History:  Procedure Laterality Date  . BREAST BIOPSY Right 01/01/13   Invasive mammary ca,metastatic in 1/1 lymph node  . BREAST BIOPSY Left 01/09/13   Fibroadenoma,microcalcifications  . CESAREAN SECTION  1989  . I&D EXTREMITY Right 05/07/2018   Procedure: BURSECTOMY RIGHT ELBOW;  Surgeon: Newt Minion, MD;  Location: [facility];  Service: Orthopedics;  Laterality: Right;  . LUMBAR DISC SURGERY  2009   Dr Maxie Better  . MASTECTOMY COMPLETE / SIMPLE Left 06/09/2013  . MASTECTOMY MODIFIED RADICAL Right 06/09/2013  . MASTECTOMY MODIFIED RADICAL Right 06/09/2013   Procedure: RIGHT MASTECTOMY MODIFIED RADICAL;  Surgeon: Adin Hector, MD;  Location: [facility];  Service: General;  Laterality: Right;  . PORTACATH PLACEMENT Left 01/2013  . SIMPLE MASTECTOMY WITH AXILLARY SENTINEL NODE BIOPSY Left 06/09/2013   Procedure: LEFT SIMPLE MASTECTOMY;  Surgeon: Adin Hector, MD;  Location: [facility];  Service: General;  Laterality: Left;  . TUBAL LIGATION  1991   Social History   Occupational History    Employer: [employer]   Tobacco Use  . Smoking status: Never Smoker  . Smokeless tobacco: Never Used  Substance and Sexual Activity  . Alcohol use: No  . Drug use: No  . Sexual activity: Yes

## 2018-05-27 ENCOUNTER — Telehealth (INDEPENDENT_AMBULATORY_CARE_PROVIDER_SITE_OTHER): Payer: Self-pay | Admitting: Orthopedic Surgery

## 2018-05-27 NOTE — Telephone Encounter (Signed)
Called and lm on vm for pt to advise that I spoke with [facility] and she advised to use ace bandage for compression and to bring the sleeve with her to her appt this Thursday and we can look at how it fits and see if we have something different. To call with questions.

## 2018-05-27 NOTE — Telephone Encounter (Signed)
Patient called stating that the sleeve that Dr. Sharol Given gave her to wear is a little short and is causing swelling in her wrist and hand.  She is wanting to know if he could get her a longer sleeve or tell her where to get one.  CB#[phone].  Thank  You.

## 2018-05-29 ENCOUNTER — Ambulatory Visit (INDEPENDENT_AMBULATORY_CARE_PROVIDER_SITE_OTHER): Payer: 59 | Admitting: Physician Assistant

## 2018-05-29 ENCOUNTER — Encounter (INDEPENDENT_AMBULATORY_CARE_PROVIDER_SITE_OTHER): Payer: Self-pay | Admitting: Physician Assistant

## 2018-05-29 VITALS — Ht 64.0 in | Wt 141.0 lb

## 2018-05-29 DIAGNOSIS — Z171 Estrogen receptor negative status [ER-]: Secondary | ICD-10-CM

## 2018-05-29 DIAGNOSIS — E1065 Type 1 diabetes mellitus with hyperglycemia: Secondary | ICD-10-CM

## 2018-05-29 DIAGNOSIS — M71121 Other infective bursitis, right elbow: Secondary | ICD-10-CM

## 2018-05-29 DIAGNOSIS — C50411 Malignant neoplasm of upper-outer quadrant of right female breast: Secondary | ICD-10-CM

## 2018-05-29 DIAGNOSIS — I890 Lymphedema, not elsewhere classified: Secondary | ICD-10-CM

## 2018-05-29 NOTE — Progress Notes (Signed)
Office Visit Note   Patient: Jasmine Schwartz           Date of Birth: 1965-08-19           MRN: [id number] Visit Date: 05/29/2018              Requested by: [name], MD [address] [place], [place] [address] PCP: [name], MD  Chief Complaint  Patient presents with  . Right Elbow - Routine Post Op    05/07/18 right elbow bursectomy       HPI: Patient is a 52 year old female who is seen for postoperative follow-up following a right elbow bursectomy on 05/07/2018 for septic bursa.  She had some slight opening about the incision and they have been utilizing some pleuro-gel to the area.  She has been using a medium compression sleeve and noticed that the wound was improving with the compression but the sleeve was too short and she was noticing some edema of her hands and upper arm and we have been able to obtain a large sleeve for the patient and put this on today and this looks much better.  She denies any fever chills or other symptoms of infection.  She has not had much drainage except for some scant serous drainage from the area.  Assessment & Plan: Visit Diagnoses:  1. Septic bursitis of elbow, right   2. Type 1 diabetes mellitus with hyperglycemia (HCC)   3. Malignant neoplasm of upper-outer quadrant of right breast in female, estrogen receptor negative ([place])   4. Lymphedema of upper extremity     Plan: Patient will continue pleuro-gel to the area 1-2 times daily and then apply her large silver compression sleeve which fits much better.  She will follow-up in 2 weeks or sooner should she have difficulties in the interim.  Follow-Up Instructions: Return in about 2 weeks (around 06/12/2018).   Ortho Exam  Patient is alert, oriented, no adenopathy, well-dressed, normal affect, normal respiratory effort. Examination of the right elbow shows slight opening of the incisional area approximately 1.5 x 0.5 cm with yellow fibrous tissue in the wound bed.  There are  no signs of cellulitis.  Scant serous drainage.  Imaging: No results found.   Labs: Lab Results  Component Value Date   HGBA1C 8.7 (H) 05/07/2018   HGBA1C 7.9 (A) 02/27/2018   HGBA1C 8.7 11/06/2017   ESRSEDRATE 58 (H) 12/05/2017   ESRSEDRATE 44 (H) 11/06/2017   ESRSEDRATE 33 (H) 02/04/2008   CRP <0.8 12/05/2017   LABURIC 3.7 04/24/2018   REPTSTATUS 05/13/2018 FINAL 05/07/2018   GRAMSTAIN NO WBC SEEN NO ORGANISMS SEEN  05/07/2018   CULT  05/07/2018    No growth aerobically or anaerobically. Performed at [facility], [place] [address], [place], [place] [address]      Lab Results  Component Value Date   ALBUMIN 3.0 (L) 12/05/2017   ALBUMIN 3.2 (L) 11/06/2017   ALBUMIN 3.9 06/19/2017   LABURIC 3.7 04/24/2018    Body mass index is 24.2 kg/m.  Orders:  No orders of the defined types were placed in this encounter.  No orders of the defined types were placed in this encounter.    Procedures: No procedures performed  Clinical Data: No additional findings.  ROS:  All other systems negative, except as noted in the HPI. Review of Systems  Objective: Vital Signs: Ht 5\' 4"  (1.626 m)   Wt 141 lb (64 kg)   BMI 24.20 kg/m  Specialty Comments:  No specialty comments available.  PMFS History: Patient Active Problem List   Diagnosis Date Noted  . Septic olecranon bursitis, right   . Rash 11/08/2017  . Myalgia 11/06/2017  . Hypercalcemia 11/06/2017  . Cough 10/09/2017  . Syncope 07/12/2017  . Chest pain 02/15/2016  . Genetic testing 04/28/2015  . Hypothyroidism 04/06/2015  . Screening examination for infectious disease 04/06/2015  . Wellness examination 09/16/2014  . Lymphedema of upper extremity 07/13/2014  . Lower extremity edema 05/27/2013  . 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) 01/01/2013  . Shortness of breath 03/31/2012  . Anxiety and depression 07/06/2011  . Encounter for  long-term (current) use of other medications 03/23/2011  . Goiter 03/23/2011  . URTICARIA 03/23/2010  . HYPERCHOLESTEROLEMIA 10/25/2008  . LEG PAIN, RIGHT 02/04/2008  . Type 1 diabetes mellitus ([place]) 03/20/2007  . ANXIETY 03/20/2007  . DEPRESSION 03/20/2007   Past Medical History:  Diagnosis Date  . Anxiety   . Anxiety and depression 07/06/2011  . Breast cancer ([place]) 01/01/13   /metastatic 1/1 lymph nodes, chemo, radiation, surgery  . Depression   . Diabetes mellitus    Type I  . Dyslipidemia   . Hypothyroidism   . Lower extremity edema   . Lymphadenopathy, supraclavicular   . PONV (postoperative nausea and vomiting)   . Type I diabetes mellitus (HCC)    uses insulin pump  . Venous insufficiency     Family History  Problem Relation Age of Onset  . Coronary artery disease Mother   . Congestive Heart Failure Maternal Grandmother   . Brain cancer Maternal Grandfather        dx in his early 50s  . Cancer Neg Hx   . Diabetes Neg Hx     Past Surgical History:  Procedure Laterality Date  . BREAST BIOPSY Right 01/01/13   Invasive mammary ca,metastatic in 1/1 lymph node  . BREAST BIOPSY Left 01/09/13   Fibroadenoma,microcalcifications  . CESAREAN SECTION  1989  . I&D EXTREMITY Right 05/07/2018   Procedure: BURSECTOMY RIGHT ELBOW;  Surgeon: Newt Minion, MD;  Location: [facility];  Service: Orthopedics;  Laterality: Right;  . LUMBAR DISC SURGERY  2009   Dr Maxie Better  . MASTECTOMY COMPLETE / SIMPLE Left 06/09/2013  . MASTECTOMY MODIFIED RADICAL Right 06/09/2013  . MASTECTOMY MODIFIED RADICAL Right 06/09/2013   Procedure: RIGHT MASTECTOMY MODIFIED RADICAL;  Surgeon: Adin Hector, MD;  Location: [facility];  Service: General;  Laterality: Right;  . PORTACATH PLACEMENT Left 01/2013  . SIMPLE MASTECTOMY WITH AXILLARY SENTINEL NODE BIOPSY Left 06/09/2013   Procedure: LEFT SIMPLE MASTECTOMY;  Surgeon: Adin Hector, MD;  Location: [facility];  Service: General;  Laterality: Left;  . TUBAL  LIGATION  1991   Social History   Occupational History    Employer: [employer]   Tobacco Use  . Smoking status: Never Smoker  . Smokeless tobacco: Never Used  Substance and Sexual Activity  . Alcohol use: No  . Drug use: No  . Sexual activity: Yes

## 2018-06-02 ENCOUNTER — Ambulatory Visit (INDEPENDENT_AMBULATORY_CARE_PROVIDER_SITE_OTHER): Payer: 59 | Admitting: Physician Assistant

## 2018-06-06 ENCOUNTER — Other Ambulatory Visit: Payer: Self-pay | Admitting: Endocrinology

## 2018-06-12 ENCOUNTER — Encounter (INDEPENDENT_AMBULATORY_CARE_PROVIDER_SITE_OTHER): Payer: Self-pay | Admitting: Physician Assistant

## 2018-06-12 ENCOUNTER — Ambulatory Visit (INDEPENDENT_AMBULATORY_CARE_PROVIDER_SITE_OTHER): Payer: 59 | Admitting: Physician Assistant

## 2018-06-12 VITALS — Ht 64.0 in | Wt 141.0 lb

## 2018-06-12 DIAGNOSIS — I890 Lymphedema, not elsewhere classified: Secondary | ICD-10-CM

## 2018-06-12 DIAGNOSIS — Z171 Estrogen receptor negative status [ER-]: Secondary | ICD-10-CM

## 2018-06-12 DIAGNOSIS — E1065 Type 1 diabetes mellitus with hyperglycemia: Secondary | ICD-10-CM

## 2018-06-12 DIAGNOSIS — C50411 Malignant neoplasm of upper-outer quadrant of right female breast: Secondary | ICD-10-CM

## 2018-06-12 DIAGNOSIS — M71121 Other infective bursitis, right elbow: Secondary | ICD-10-CM

## 2018-06-12 NOTE — Progress Notes (Signed)
Office Visit Note   Patient: Jasmine Schwartz           Date of Birth: 23-Sep-1965           MRN: [id number] Visit Date: 06/12/2018              Requested by: [name], MD [address] [place], [place] [address] PCP: [name], MD  Chief Complaint  Patient presents with  . Right Elbow - Routine Post Op    05/07/18 right elbow bursectomy       HPI: The patient is a 52 yo female who is seen for post operative follow up following right elbow bursectomy for septic bursa on 05/07/18. She has been using some Plurogel to the area and silver compression sleeve for edema control. She is pleased with how it is progressing.   Assessment & Plan: Visit Diagnoses:  1. Septic bursitis of elbow, right   2. Type 1 diabetes mellitus with hyperglycemia (HCC)   3. Malignant neoplasm of upper-outer quadrant of right breast in female, estrogen receptor negative ([place])   4. Lymphedema of upper extremity     Plan: May stop the Plurogel, but continue to wash area with soap and water and may use wash cloth to clean the area daily and reapply silver compression sleeve. May bend the elbow to tolerance now. follow up in 4 weeks.   Follow-Up Instructions: Return in about 4 weeks (around 07/10/2018).   Ortho Exam  Patient is alert, oriented, no adenopathy, well-dressed, normal affect, normal respiratory effort. Right elbow wound continues to gradually improve and is without signs of infection. Mild edema.  Wound measures ~ 1 x 0.5 cm Imaging: No results found.   Labs: Lab Results  Component Value Date   HGBA1C 8.7 (H) 05/07/2018   HGBA1C 7.9 (A) 02/27/2018   HGBA1C 8.7 11/06/2017   ESRSEDRATE 58 (H) 12/05/2017   ESRSEDRATE 44 (H) 11/06/2017   ESRSEDRATE 33 (H) 02/04/2008   CRP <0.8 12/05/2017   LABURIC 3.7 04/24/2018   REPTSTATUS 05/13/2018 FINAL 05/07/2018   GRAMSTAIN NO WBC SEEN NO ORGANISMS SEEN  05/07/2018   CULT  05/07/2018    No growth aerobically or  anaerobically. Performed at [facility], [place] [address] [place], [place] [address]      Lab Results  Component Value Date   ALBUMIN 3.0 (L) 12/05/2017   ALBUMIN 3.2 (L) 11/06/2017   ALBUMIN 3.9 06/19/2017   LABURIC 3.7 04/24/2018    Body mass index is 24.2 kg/m.  Orders:  No orders of the defined types were placed in this encounter.  No orders of the defined types were placed in this encounter.    Procedures: No procedures performed  Clinical Data: No additional findings.  ROS:  All other systems negative, except as noted in the HPI. Review of Systems  Objective: Vital Signs: Ht 5\' 4"  (1.626 m)   Wt 141 lb (64 kg)   BMI 24.20 kg/m   Specialty Comments:  No specialty comments available.  PMFS History: Patient Active Problem List   Diagnosis Date Noted  . Septic olecranon bursitis, right   . Rash 11/08/2017  . Myalgia 11/06/2017  . Hypercalcemia 11/06/2017  . Cough 10/09/2017  . Syncope 07/12/2017  . Chest pain 02/15/2016  . Genetic testing 04/28/2015  . Hypothyroidism 04/06/2015  . Screening examination for infectious disease 04/06/2015  . Wellness examination 09/16/2014  . Lymphedema of upper extremity 07/13/2014  . Lower extremity edema 05/27/2013  .  Malignant neoplasm of upper-outer quadrant of right breast in female, estrogen receptor negative ([place]) 01/01/2013  . Shortness of breath 03/31/2012  . Anxiety and depression 07/06/2011  . Encounter for long-term (current) use of other medications 03/23/2011  . Goiter 03/23/2011  . URTICARIA 03/23/2010  . HYPERCHOLESTEROLEMIA 10/25/2008  . LEG PAIN, RIGHT 02/04/2008  . Type 1 diabetes mellitus ([place]) 03/20/2007  . ANXIETY 03/20/2007  . DEPRESSION 03/20/2007   Past Medical History:  Diagnosis Date  . Anxiety   . Anxiety and depression 07/06/2011  . Breast cancer ([place]) 01/01/13   /metastatic 1/1 lymph nodes, chemo, radiation, surgery  . Depression   . Diabetes mellitus    Type I  .  Dyslipidemia   . Hypothyroidism   . Lower extremity edema   . Lymphadenopathy, supraclavicular   . PONV (postoperative nausea and vomiting)   . Type I diabetes mellitus (HCC)    uses insulin pump  . Venous insufficiency     Family History  Problem Relation Age of Onset  . Coronary artery disease Mother   . Congestive Heart Failure Maternal Grandmother   . Brain cancer Maternal Grandfather        dx in his early 65s  . Cancer Neg Hx   . Diabetes Neg Hx     Past Surgical History:  Procedure Laterality Date  . BREAST BIOPSY Right 01/01/13   Invasive mammary ca,metastatic in 1/1 lymph node  . BREAST BIOPSY Left 01/09/13   Fibroadenoma,microcalcifications  . CESAREAN SECTION  1989  . I&D EXTREMITY Right 05/07/2018   Procedure: BURSECTOMY RIGHT ELBOW;  Surgeon: Newt Minion, MD;  Location: [facility];  Service: Orthopedics;  Laterality: Right;  . LUMBAR DISC SURGERY  2009   Dr Maxie Better  . MASTECTOMY COMPLETE / SIMPLE Left 06/09/2013  . MASTECTOMY MODIFIED RADICAL Right 06/09/2013  . MASTECTOMY MODIFIED RADICAL Right 06/09/2013   Procedure: RIGHT MASTECTOMY MODIFIED RADICAL;  Surgeon: Adin Hector, MD;  Location: [facility];  Service: General;  Laterality: Right;  . PORTACATH PLACEMENT Left 01/2013  . SIMPLE MASTECTOMY WITH AXILLARY SENTINEL NODE BIOPSY Left 06/09/2013   Procedure: LEFT SIMPLE MASTECTOMY;  Surgeon: Adin Hector, MD;  Location: [facility];  Service: General;  Laterality: Left;  . TUBAL LIGATION  1991   Social History   Occupational History    Employer: [employer]   Tobacco Use  . Smoking status: Never Smoker  . Smokeless tobacco: Never Used  Substance and Sexual Activity  . Alcohol use: No  . Drug use: No  . Sexual activity: Yes

## 2018-06-22 NOTE — Progress Notes (Signed)
CLINIC:  Survivorship   REASON FOR VISIT:  Routine follow-up for history of breast cancer.   BRIEF ONCOLOGIC HISTORY:    Malignant neoplasm of upper-outer quadrant of right breast in female, estrogen receptor negative ([place])   01/01/2013 Initial Diagnosis    Mammogram showed diffuse skin thickening and enlarged right axillary lymph nodes with right breast density ultrasound showed 4.5 cm mass right axillary 3.1 cm biopsy showed IMC grade 2-3 lymph node also positive ER/PR neg HER-2 pos Ki-67 76%, MRI 8 cm mas    01/16/2013 - 05/01/2013 Neo-Adjuvant Chemotherapy    Taxotere carboplatin Herceptin and Perjeta x6 cycles    05/29/2013 Surgery    Bilateral mastectomies, left breasts no evidence of cancer right breast no evidence of cancer 40 lymph nodes negative complete pathologic response     - 02/02/2014 Chemotherapy    Adjuvant Herceptin    07/27/2013 - 09/11/2013 Radiation Therapy    Adjuvant radiation therapy      INTERVAL HISTORY:  Jasmine Schwartz presents to the Survivorship Clinic today for routine follow-up for her history of breast cancer.  Overall, she reports feeling quite well. She underwent elbow surgery about 4-5 weeks ago.  She developed cellulitis in the arm, and a pocket of fluid stayed there, and an abscess burst and drained.  This is in the arm she has had lymphedema, so it is taking slightly longer to heal.  She notes she is following with Dr. Sharol Given about this.  She wants to see about getting a lymphedema sleeve.      REVIEW OF SYSTEMS:  Review of Systems  Constitutional: Negative for appetite change, chills, fatigue, fever and unexpected weight change.  HENT:   Negative for hearing loss, lump/mass, sore throat and trouble swallowing.   Eyes: Negative for eye problems and icterus.  Respiratory: Negative for chest tightness, cough and shortness of breath.   Cardiovascular: Negative for chest pain, leg swelling and palpitations.  Gastrointestinal: Negative for abdominal  distention, abdominal pain, constipation, diarrhea, nausea and vomiting.  Endocrine: Negative for hot flashes.  Musculoskeletal: Negative for arthralgias.  Skin: Negative for itching and rash.  Neurological: Negative for dizziness, extremity weakness, headaches and numbness.  Hematological: Negative for adenopathy. Does not bruise/bleed easily.  Psychiatric/Behavioral: Negative for depression. The patient is not nervous/anxious.    Breast: Denies any new nodularity, masses, tenderness, nipple changes, or nipple discharge.       PAST MEDICAL/SURGICAL HISTORY:  Past Medical History:  Diagnosis Date  . Anxiety   . Anxiety and depression 07/06/2011  . Breast cancer ([place]) 01/01/13   /metastatic 1/1 lymph nodes, chemo, radiation, surgery  . Depression   . Diabetes mellitus    Type I  . Dyslipidemia   . Hypothyroidism   . Lower extremity edema   . Lymphadenopathy, supraclavicular   . PONV (postoperative nausea and vomiting)   . Type I diabetes mellitus ([facility])    uses insulin pump  . Venous insufficiency    Past Surgical History:  Procedure Laterality Date  . BREAST BIOPSY Right 01/01/13   Invasive mammary ca,metastatic in 1/1 lymph node  . BREAST BIOPSY Left 01/09/13   Fibroadenoma,microcalcifications  . CESAREAN SECTION  1989  . I&D EXTREMITY Right 05/07/2018   Procedure: BURSECTOMY RIGHT ELBOW;  Surgeon: Newt Minion, MD;  Location: [facility];  Service: Orthopedics;  Laterality: Right;  . LUMBAR DISC SURGERY  2009   Dr Maxie Better  . MASTECTOMY COMPLETE / SIMPLE Left 06/09/2013  . MASTECTOMY MODIFIED RADICAL Right 06/09/2013  .  MASTECTOMY MODIFIED RADICAL Right 06/09/2013   Procedure: RIGHT MASTECTOMY MODIFIED RADICAL;  Surgeon: Adin Hector, MD;  Location: [facility];  Service: General;  Laterality: Right;  . PORTACATH PLACEMENT Left 01/2013  . SIMPLE MASTECTOMY WITH AXILLARY SENTINEL NODE BIOPSY Left 06/09/2013   Procedure: LEFT SIMPLE MASTECTOMY;  Surgeon: Adin Hector, MD;   Location: [facility];  Service: General;  Laterality: Left;  . TUBAL LIGATION  1991     ALLERGIES:  Allergies  Allergen Reactions  . Doxycycline Rash     CURRENT MEDICATIONS:  Outpatient Encounter Medications as of 06/23/2018  Medication Sig Note  . Ascorbic Acid (VITAMIN C) 1000 MG tablet Take 2,000 mg by mouth 2 (two) times daily.   . Continuous Blood Gluc Sensor (FREESTYLE LIBRE 14 DAY SENSOR) MISC 1 Device by Does not apply route daily. Use one device every 14 days to check blood sugars. DX code E10.9.   . CONTOUR NEXT TEST test strip TEST BLOOD SUGAR FIVE TIMES DAILY   . HYDROcodone-acetaminophen (NORCO/VICODIN) 5-325 MG tablet Take 1 tablet by mouth every 4 (four) hours as needed for moderate pain.   . hydroxychloroquine (PLAQUENIL) 200 MG tablet Take 200 mg by mouth 2 (two) times daily. 05/06/2018: Pt hasnt taken in months but was recently instructed to go back on, she hasnt started back yet  . ibuprofen (ADVIL,MOTRIN) 200 MG tablet Take 400 mg by mouth 2 (two) times daily as needed for headache or moderate pain.   Marland Kitchen insulin lispro (HUMALOG) 100 UNIT/ML injection ADMINISTER 60 UNITS VIA INSULIN PUMP DAILY   . insulin lispro (HUMALOG) 100 UNIT/ML injection USE IN PUMP, TOTAL OF 60 UNITS PER DAY   . Lancets (ACCU-CHEK MULTICLIX) lancets Use to check blood sugar 5 times per day   . levothyroxine (SYNTHROID, LEVOTHROID) 100 MCG tablet Take 1 tablet (100 mcg total) by mouth daily before breakfast.   . mupirocin ointment (BACTROBAN) 2 % Apply 1 application topically 2 (two) times daily. Apply to the affected area 2 times a day (Patient taking differently: Apply 1 application topically 2 (two) times daily. )   . predniSONE (DELTASONE) 5 MG tablet Take 2.5-5 mg by mouth See admin instructions. Take 5 mg in the morning and 2.5 mg at night for 1 week, then 5 mg once daily for 1 week, then 2.5 mg once daily for 1 week 05/06/2018: Currently taking the 5 in the morning and 2.5 mg in the evening  .  sulfamethoxazole-trimethoprim (BACTRIM DS,SEPTRA DS) 800-160 MG tablet Take 1 tablet by mouth 2 (two) times daily. 05/06/2018: Unknown start / should finish around 9/29   No facility-administered encounter medications on file as of 06/23/2018.      ONCOLOGIC FAMILY HISTORY:  Family History  Problem Relation Age of Onset  . Coronary artery disease Mother   . Congestive Heart Failure Maternal Grandmother   . Brain cancer Maternal Grandfather        dx in his early 51s  . Cancer Neg Hx   . Diabetes Neg Hx     GENETIC COUNSELING/TESTING: Not at this time  SOCIAL HISTORY:  Social History   Socioeconomic History  . Marital status: Married    Spouse name: Not on file  . Number of children: 2  . Years of education: Not on file  . Highest education level: Not on file  Occupational History    Employer: [employer]   Social Needs  . Financial resource strain: Not on file  . Food insecurity:  Worry: Not on file    Inability: Not on file  . Transportation needs:    Medical: Not on file    Non-medical: Not on file  Tobacco Use  . Smoking status: Never Smoker  . Smokeless tobacco: Never Used  Substance and Sexual Activity  . Alcohol use: No  . Drug use: No  . Sexual activity: Yes  Lifestyle  . Physical activity:    Days per week: Not on file    Minutes per session: Not on file  . Stress: Not on file  Relationships  . Social connections:    Talks on phone: Not on file    Gets together: Not on file    Attends religious service: Not on file    Active member of club or organization: Not on file    Attends meetings of clubs or organizations: Not on file    Relationship status: Not on file  . Intimate partner violence:    Fear of current or ex partner: Not on file    Emotionally abused: Not on file    Physically abused: Not on file    Forced sexual activity: Not on file  Other Topics Concern  . Not on file  Social History Narrative  . Not on file     PHYSICAL  EXAMINATION:  Vital Signs: Vitals:   06/23/18 1428  BP: 113/70  Pulse: 92  Resp: 18  Temp: 97.7 F (36.5 C)  SpO2: 100%   Filed Weights   06/23/18 1428  Weight: 155 lb 9.6 oz (70.6 kg)   General: Well-nourished, well-appearing female in no acute distress.  Accompanied by her husband today.   HEENT: Head is normocephalic.  Pupils equal and reactive to light. Conjunctivae clear without exudate.  Sclerae anicteric. Oral mucosa is pink, moist.  Oropharynx is pink without lesions or erythema.  Lymph: No cervical, supraclavicular, or infraclavicular lymphadenopathy noted on palpation.  Cardiovascular: Regular rate and rhythm.Marland Kitchen Respiratory: Clear to auscultation bilaterally. Chest expansion symmetric; breathing non-labored.  Breast Exam:  Breasts: s/p bilateral mastectomies no sign of local recurrence -Axilla: No axillary adenopathy bilaterally.  GI: Abdomen soft and round; non-tender, non-distended. Bowel sounds normoactive. No hepatosplenomegaly.   GU: Deferred.  Neuro: No focal deficits. Steady gait.  Psych: Mood and affect normal and appropriate for situation.  MSK: No focal spinal tenderness to palpation, full range of motion in bilateral upper extremities Extremities: right arm is swollen, there is a bandage over her elbow that is clean dry and intact Skin: Warm and dry.  LABORATORY DATA:  None for this visit   DIAGNOSTIC IMAGING:  Most recent mammogram: n/a s/p bilateral mastectomies    ASSESSMENT AND PLAN:  Ms.. Schwartz is a pleasant 52 y.o. female with history of right breast invasive ductal carcinoma, ER-/PR-/HER2+, diagnosed in 12/2012, treated with neoadjuvant chemotherapy, bilateral mastectomies, maintenance Herceptin, and adjuvant radiation therapy.  She presents to the Survivorship Clinic for surveillance and routine follow-up.   1. History of breast cancer:  Jasmine Schwartz is currently clinically and radiographically without evidence of disease or recurrence of breast cancer.   She has now reached her five year period, and I offered her the opportunity to graduate in the near future.  She noted that she doesn't have anyone that she feels comfortable with doing her breast exam so she will return here annually.   I encouraged her to call me with any questions or concerns before her next visit at the cancer center, and I would be happy to  see her sooner, if needed.    2. Right arm lymphedema: Once her arm heals she would benefit from evaluation and treatment with PT.  She will ask Dr. Sharol Given when she can start doing that and wearing a lymphedema sleeve.  I gave her the PT office number, and she will let me know when she is ready to go and I will place that referral for her.    3. Bone health:  Given Jasmine Schwartz age, history of breast cancer, she is at slight risk for bone demineralization.  She was given education on specific food and activities to promote bone health.  4. Cancer screening:  Due to Jasmine Schwartz's history and her age, she should receive screening for skin cancers, colon cancer, and gynecologic cancers. She was encouraged to follow-up with her PCP for appropriate cancer screenings.   5. Health maintenance and wellness promotion: Jasmine Schwartz was encouraged to consume 5-7 servings of fruits and vegetables per day. She was also encouraged to engage in moderate to vigorous exercise for 30 minutes per day most days of the week. She was instructed to limit her alcohol consumption and continue to abstain from tobacco use.    Dispo:  -Return to cancer center in one year for f/u -PT eval once cleared by Dr. Sharol Given   A total of (30) minutes of face-to-face time was spent with this patient with greater than 50% of that time in counseling and care-coordination.   Jasmine Schwartz, [place] [phone]   Note: PRIMARY CARE PROVIDER [name], [place] [phone]

## 2018-06-23 ENCOUNTER — Encounter: Payer: Self-pay | Admitting: Adult Health

## 2018-06-23 ENCOUNTER — Inpatient Hospital Stay: Payer: 59 | Attending: Adult Health | Admitting: Adult Health

## 2018-06-23 ENCOUNTER — Telehealth: Payer: Self-pay | Admitting: Adult Health

## 2018-06-23 VITALS — BP 113/70 | HR 92 | Temp 97.7°F | Resp 18 | Ht 64.0 in | Wt 155.6 lb

## 2018-06-23 DIAGNOSIS — Z853 Personal history of malignant neoplasm of breast: Secondary | ICD-10-CM | POA: Diagnosis not present

## 2018-06-23 DIAGNOSIS — C50411 Malignant neoplasm of upper-outer quadrant of right female breast: Secondary | ICD-10-CM

## 2018-06-23 DIAGNOSIS — Z171 Estrogen receptor negative status [ER-]: Secondary | ICD-10-CM

## 2018-06-23 NOTE — Patient Instructions (Addendum)
Lymphedema Clinic: [phone]  Bone Health Bones protect organs, store calcium, and anchor muscles. Good health habits, such as eating nutritious foods and exercising regularly, are important for maintaining healthy bones. They can also help to prevent a condition that causes bones to lose density and become weak and brittle (osteoporosis). Why is bone mass important? Bone mass refers to the amount of bone tissue that you have. The higher your bone mass, the stronger your bones. An important step toward having healthy bones throughout life is to have strong and dense bones during childhood. A young adult who has a high bone mass is more likely to have a high bone mass later in life. Bone mass at its greatest it is called peak bone mass. A large decline in bone mass occurs in older adults. In women, it occurs about the time of menopause. During this time, it is important to practice good health habits, because if more bone is lost than what is replaced, the bones will become less healthy and more likely to break (fracture). If you find that you have a low bone mass, you may be able to prevent osteoporosis or further bone loss by changing your diet and lifestyle. How can I find out if my bone mass is low? Bone mass can be measured with an X-ray test that is called a bone mineral density (BMD) test. This test is recommended for all women who are age 772 or older. It may also be recommended for men who are age 58 or older, or for people who are more likely to develop osteoporosis due to:  Having bones that break easily.  Having a long-term disease that weakens bones, such as kidney disease or rheumatoid arthritis.  Having menopause earlier than normal.  Taking medicine that weakens bones, such as steroids, thyroid hormones, or hormone treatment for breast cancer or prostate cancer.  Smoking.  Drinking three or more alcoholic drinks each day.  What are the nutritional recommendations for healthy  bones? To have healthy bones, you need to get enough of the right minerals and vitamins. Most nutrition experts recommend getting these nutrients from the foods that you eat. Nutritional recommendations vary from person to person. Ask your health care provider what is healthy for you. Here are some general guidelines. Calcium Recommendations Calcium is the most important (essential) mineral for bone health. Most people can get enough calcium from their diet, but supplements may be recommended for people who are at risk for osteoporosis. Good sources of calcium include:  Dairy products, such as low-fat or nonfat milk, cheese, and yogurt.  Dark green leafy vegetables, such as bok choy and broccoli.  Calcium-fortified foods, such as orange juice, cereal, bread, soy beverages, and tofu products.  Nuts, such as almonds.  Follow these recommended amounts for daily calcium intake:  Children, age 64?3: 700 mg.  Children, age 77?8: 1,000 mg.  Children, age 34?13: 1,300 mg.  Teens, age 72?18: 1,300 mg.  Adults, age 67?50: 1,000 mg.  Adults, age 81?70: ? Men: 1,000 mg. ? Women: 1,200 mg.  Adults, age 36 or older: 1,200 mg.  Pregnant and breastfeeding females: ? Teens: 1,300 mg. ? Adults: 1,000 mg.  Vitamin D Recommendations Vitamin D is the most essential vitamin for bone health. It helps the body to absorb calcium. Sunlight stimulates the skin to make vitamin D, so be sure to get enough sunlight. If you live in a cold climate or you do not get outside often, your health care provider may recommend  that you take vitamin D supplements. Good sources of vitamin D in your diet include:  Egg yolks.  Saltwater fish.  Milk and cereal fortified with vitamin D.  Follow these recommended amounts for daily vitamin D intake:  Children and teens, age 83?18: 34 international units.  Adults, age 9 or younger: 400-800 international units.  Adults, age 9 or older: 800-1,000 international  units.  Other Nutrients Other nutrients for bone health include:  Phosphorus. This mineral is found in meat, poultry, dairy foods, nuts, and legumes. The recommended daily intake for adult men and adult women is 700 mg.  Magnesium. This mineral is found in seeds, nuts, dark green vegetables, and legumes. The recommended daily intake for adult men is 400?420 mg. For adult women, it is 310?320 mg.  Vitamin K. This vitamin is found in green leafy vegetables. The recommended daily intake is 120 mg for adult men and 90 mg for adult women.  What type of physical activity is best for building and maintaining healthy bones? Weight-bearing and strength-building activities are important for building and maintaining peak bone mass. Weight-bearing activities cause muscles and bones to work against gravity. Strength-building activities increases muscle strength that supports bones. Weight-bearing and muscle-building activities include:  Walking and hiking.  Jogging and running.  Dancing.  Gym exercises.  Lifting weights.  Tennis and racquetball.  Climbing stairs.  Aerobics.  Adults should get at least 30 minutes of moderate physical activity on most days. Children should get at least 60 minutes of moderate physical activity on most days. Ask your health care provide what type of exercise is best for you. Where can I find more information? For more information, check out the following websites:  [place]: YardHomes.se  [employer] of Health: http://www.niams.AnonymousEar.fr.asp  This information is not intended to replace advice given to you by your health care provider. Make sure you discuss any questions you have with your health care provider. Document Released: 10/20/2003 Document Revised: 02/17/2016 Document Reviewed: 08/04/2014 Elsevier Interactive Patient Education  [employer].

## 2018-06-23 NOTE — Telephone Encounter (Signed)
Printed calendar and avs. °

## 2018-06-30 ENCOUNTER — Other Ambulatory Visit: Payer: Self-pay | Admitting: Endocrinology

## 2018-07-04 ENCOUNTER — Other Ambulatory Visit: Payer: Self-pay | Admitting: Endocrinology

## 2018-07-14 ENCOUNTER — Ambulatory Visit (INDEPENDENT_AMBULATORY_CARE_PROVIDER_SITE_OTHER): Payer: 59 | Admitting: Orthopedic Surgery

## 2018-07-17 ENCOUNTER — Ambulatory Visit (INDEPENDENT_AMBULATORY_CARE_PROVIDER_SITE_OTHER): Payer: 59 | Admitting: Orthopedic Surgery

## 2018-07-25 ENCOUNTER — Ambulatory Visit: Payer: 59 | Admitting: Endocrinology

## 2018-07-25 ENCOUNTER — Encounter: Payer: Self-pay | Admitting: Endocrinology

## 2018-07-25 VITALS — BP 110/60 | HR 91 | Ht 64.0 in | Wt 156.2 lb

## 2018-07-25 DIAGNOSIS — E038 Other specified hypothyroidism: Secondary | ICD-10-CM | POA: Diagnosis not present

## 2018-07-25 DIAGNOSIS — E108 Type 1 diabetes mellitus with unspecified complications: Secondary | ICD-10-CM | POA: Diagnosis not present

## 2018-07-25 LAB — TSH: TSH: 0.28 u[IU]/mL — ABNORMAL LOW (ref 0.35–4.50)

## 2018-07-25 LAB — POCT GLYCOSYLATED HEMOGLOBIN (HGB A1C): HEMOGLOBIN A1C: 7.4 % — AB (ref 4.0–5.6)

## 2018-07-25 LAB — T4, FREE: Free T4: 1.35 ng/dL (ref 0.60–1.60)

## 2018-07-25 NOTE — Patient Instructions (Addendum)
Please continue these pump settings:  basal rate of 1.7 units/HR, 24 HRS per day.  mealtime bolus of 1 unit/8 grams carbohydrate.  correction bolus (which some people call "sensitivity," or "insulin sensitivity ratio," or just "isr") of 1 unit for each 40 by which your glucose exceeds 100).  Taking the bolus frequently will help the blood sugar until the effect of the prednisone wears off.   Please come back for a follow-up appointment in 3 months.

## 2018-07-25 NOTE — Progress Notes (Signed)
Subjective:    Patient ID: Jasmine Schwartz, female    DOB: Jul 26, 1966, 52 y.o.   MRN: [id number]  HPI Pt returns for f/u of diabetes mellitus:  DM type: 1 Dx'ed: 7672 Complications: none.  Therapy: insulin since dx DKA: never Severe hypoglycemia: never Pancreatitis: never Other: she has been on pump rx since 2004; she declined continuous glucose monitor, due to cost; she has finished rx for her breast cancer, for now.  She has been intermittently on steroids for SLE. Interval history:  She has been off prednisone x 2 mos.  No more chemo is planned. She takes these settings: basal rate of 1.7 units/HR, 24 HRS per day.  mealtime bolus of 1 unit/9 grams carbohydrate.  correction bolus (which some people call "sensitivity," or "insulin sensitivity ratio," or just "isr") of 1 unit for each 40 by which your glucose exceeds 100).  TDD is approx 60 units per day.   She says cbg varies from 90-200's.  There is no trend throughout the day.  Past Medical History:  Diagnosis Date  . Anxiety   . Anxiety and depression 07/06/2011  . Breast cancer ([facility]) 01/01/13   /metastatic 1/1 lymph nodes, chemo, radiation, surgery  . Depression   . Diabetes mellitus    Type I  . Dyslipidemia   . Hypothyroidism   . Lower extremity edema   . Lymphadenopathy, supraclavicular   . PONV (postoperative nausea and vomiting)   . Type I diabetes mellitus (HCC)    uses insulin pump  . Venous insufficiency     Past Surgical History:  Procedure Laterality Date  . BREAST BIOPSY Right 01/01/13   Invasive mammary ca,metastatic in 1/1 lymph node  . BREAST BIOPSY Left 01/09/13   Fibroadenoma,microcalcifications  . CESAREAN SECTION  1989  . I&D EXTREMITY Right 05/07/2018   Procedure: BURSECTOMY RIGHT ELBOW;  Surgeon: Newt Minion, MD;  Location: [facility];  Service: Orthopedics;  Laterality: Right;  . LUMBAR DISC SURGERY  2009   Dr Maxie Better  . MASTECTOMY COMPLETE / SIMPLE Left 06/09/2013  . MASTECTOMY MODIFIED RADICAL  Right 06/09/2013  . MASTECTOMY MODIFIED RADICAL Right 06/09/2013   Procedure: RIGHT MASTECTOMY MODIFIED RADICAL;  Surgeon: Adin Hector, MD;  Location: [facility];  Service: General;  Laterality: Right;  . PORTACATH PLACEMENT Left 01/2013  . SIMPLE MASTECTOMY WITH AXILLARY SENTINEL NODE BIOPSY Left 06/09/2013   Procedure: LEFT SIMPLE MASTECTOMY;  Surgeon: Adin Hector, MD;  Location: [facility];  Service: General;  Laterality: Left;  . TUBAL LIGATION  1991    Social History   Socioeconomic History  . Marital status: Married    Spouse name: Not on file  . Number of children: 2  . Years of education: Not on file  . Highest education level: Not on file  Occupational History    Employer: [employer]   Social Needs  . Financial resource strain: Not on file  . Food insecurity:    Worry: Not on file    Inability: Not on file  . Transportation needs:    Medical: Not on file    Non-medical: Not on file  Tobacco Use  . Smoking status: Never Smoker  . Smokeless tobacco: Never Used  Substance and Sexual Activity  . Alcohol use: No  . Drug use: No  . Sexual activity: Yes  Lifestyle  . Physical activity:    Days per week: Not on file    Minutes per session: Not on file  . Stress: Not  on file  Relationships  . Social connections:    Talks on phone: Not on file    Gets together: Not on file    Attends religious service: Not on file    Active member of club or organization: Not on file    Attends meetings of clubs or organizations: Not on file    Relationship status: Not on file  . Intimate partner violence:    Fear of current or ex partner: Not on file    Emotionally abused: Not on file    Physically abused: Not on file    Forced sexual activity: Not on file  Other Topics Concern  . Not on file  Social History Narrative  . Not on file    Current Outpatient Medications on File Prior to Visit  Medication Sig Dispense Refill  . Ascorbic Acid (VITAMIN C) 1000 MG tablet Take  2,000 mg by mouth 2 (two) times daily.    . Continuous Blood Gluc Sensor (FREESTYLE LIBRE 14 DAY SENSOR) MISC 1 Device by Does not apply route daily. Use one device every 14 days to check blood sugars. DX code E10.9. 3 each 11  . CONTOUR NEXT TEST test strip TEST BLOOD SUGAR 5 TIMES A DAY (Patient taking differently: 1 each by Other route. Monitor glucose levels 5 times per day) 150 each 2  . hydroxychloroquine (PLAQUENIL) 200 MG tablet Take 200 mg by mouth 2 (two) times daily.    Marland Kitchen ibuprofen (ADVIL,MOTRIN) 200 MG tablet Take 400 mg by mouth 2 (two) times daily as needed for headache or moderate pain.    Marland Kitchen insulin lispro (HUMALOG) 100 UNIT/ML injection USE IN PUMP. TOTAL 60 UNITS DAILY 20 mL 0  . Lancets (ACCU-CHEK MULTICLIX) lancets Use to check blood sugar 5 times per day 200 each 2  . levothyroxine (SYNTHROID, LEVOTHROID) 88 MCG tablet TAKE 1 TABLET(88 MCG) BY MOUTH DAILY BEFORE BREAKFAST (Patient taking differently: Take 88 mcg by mouth daily before breakfast. ) 90 tablet 0   No current facility-administered medications on file prior to visit.     Allergies  Allergen Reactions  . Doxycycline Rash    Family History  Problem Relation Age of Onset  . Coronary artery disease Mother   . Congestive Heart Failure Maternal Grandmother   . Brain cancer Maternal Grandfather        dx in his early 64s  . Cancer Neg Hx   . Diabetes Neg Hx     BP 110/60 (BP Location: Left Arm, Patient Position: Sitting, Cuff Size: Normal)   Pulse 91   Ht 5\' 4"  (1.626 m)   Wt 156 lb 3.2 oz (70.9 kg)   SpO2 98%   BMI 26.81 kg/m   Review of Systems She denies hypoglycemia.      Objective:   Physical Exam VITAL SIGNS:  See vs page GENERAL: no distress Pulses: dorsalis pedis intact bilat.   MSK: no deformity of the feet CV: no leg edema Skin:  no ulcer on the feet.  normal color and temp on the feet. Neuro: sensation is intact to touch on the feet  A1c=7.4%     Assessment & Plan:  Type 1 DM: she  needs increased rx.   Patient Instructions  Please continue these pump settings:  basal rate of 1.7 units/HR, 24 HRS per day.  mealtime bolus of 1 unit/8 grams carbohydrate.  correction bolus (which some people call "sensitivity," or "insulin sensitivity ratio," or just "isr") of 1 unit for each 40  by which your glucose exceeds 100).  Taking the bolus frequently will help the blood sugar until the effect of the prednisone wears off.   Please come back for a follow-up appointment in 3 months.

## 2018-08-09 ENCOUNTER — Other Ambulatory Visit: Payer: Self-pay | Admitting: Endocrinology

## 2018-08-11 DIAGNOSIS — Z794 Long term (current) use of insulin: Secondary | ICD-10-CM | POA: Diagnosis not present

## 2018-08-11 DIAGNOSIS — E109 Type 1 diabetes mellitus without complications: Secondary | ICD-10-CM | POA: Diagnosis not present

## 2018-08-18 DIAGNOSIS — Z79899 Other long term (current) drug therapy: Secondary | ICD-10-CM | POA: Diagnosis not present

## 2018-08-18 DIAGNOSIS — M791 Myalgia, unspecified site: Secondary | ICD-10-CM | POA: Diagnosis not present

## 2018-08-18 DIAGNOSIS — L930 Discoid lupus erythematosus: Secondary | ICD-10-CM | POA: Diagnosis not present

## 2018-09-01 DIAGNOSIS — I890 Lymphedema, not elsewhere classified: Secondary | ICD-10-CM | POA: Diagnosis not present

## 2018-09-07 ENCOUNTER — Other Ambulatory Visit: Payer: Self-pay | Admitting: Endocrinology

## 2018-10-09 ENCOUNTER — Ambulatory Visit (INDEPENDENT_AMBULATORY_CARE_PROVIDER_SITE_OTHER): Payer: 59 | Admitting: Orthopedic Surgery

## 2018-10-09 ENCOUNTER — Encounter (INDEPENDENT_AMBULATORY_CARE_PROVIDER_SITE_OTHER): Payer: Self-pay | Admitting: Orthopedic Surgery

## 2018-10-09 VITALS — Ht 64.0 in | Wt 156.2 lb

## 2018-10-09 DIAGNOSIS — I890 Lymphedema, not elsewhere classified: Secondary | ICD-10-CM

## 2018-10-09 DIAGNOSIS — M71121 Other infective bursitis, right elbow: Secondary | ICD-10-CM

## 2018-10-13 ENCOUNTER — Encounter (INDEPENDENT_AMBULATORY_CARE_PROVIDER_SITE_OTHER): Payer: Self-pay | Admitting: Orthopedic Surgery

## 2018-10-13 NOTE — Progress Notes (Signed)
Office Visit Note   Patient: Jasmine Schwartz           Date of Birth: 1966-01-21           MRN: [id number] Visit Date: 10/09/2018              Requested by: No referring provider defined for this encounter. PCP: Patient, No Pcp Per  Chief Complaint  Patient presents with  . Right Elbow - Follow-up      HPI: Patient is a 53 year old woman who presents in follow-up status post bursectomy of the right elbow.  She states she still has pain and swelling.  Assessment & Plan: Visit Diagnoses:  1. Septic bursitis of elbow, right   2. Lymphedema of upper extremity     Plan: Patient was given an arm sleeve to wear to help decrease the lymphedema swelling she was given instructions for scar massage.  Follow-Up Instructions: Return if symptoms worsen or fail to improve.   Ortho Exam  Patient is alert, oriented, no adenopathy, well-dressed, normal affect, normal respiratory effort. Examination patient does have adhesive scarring to the olecranon.  There is no redness no cellulitis no effusion no signs of infection.  Patient does have lymphedema swelling and a arm sleeve was applied to the right upper extremity.  She states that this felt much better.  Size large arm sleeve  Imaging: No results found. No images are attached to the encounter.  Labs: Lab Results  Component Value Date   HGBA1C 7.4 (A) 07/25/2018   HGBA1C 8.7 (H) 05/07/2018   HGBA1C 7.9 (A) 02/27/2018   ESRSEDRATE 58 (H) 12/05/2017   ESRSEDRATE 44 (H) 11/06/2017   ESRSEDRATE 33 (H) 02/04/2008   CRP <0.8 12/05/2017   LABURIC 3.7 04/24/2018   REPTSTATUS 05/13/2018 FINAL 05/07/2018   GRAMSTAIN NO WBC SEEN NO ORGANISMS SEEN  05/07/2018   CULT  05/07/2018    No growth aerobically or anaerobically. Performed at [facility], [place] [address], [place], [place] [address]      Lab Results  Component Value Date   ALBUMIN 3.0 (L) 12/05/2017   ALBUMIN 3.2 (L) 11/06/2017   ALBUMIN 3.9 06/19/2017   LABURIC 3.7  04/24/2018    Body mass index is 26.81 kg/m.  Orders:  No orders of the defined types were placed in this encounter.  No orders of the defined types were placed in this encounter.    Procedures: No procedures performed  Clinical Data: No additional findings.  ROS:  All other systems negative, except as noted in the HPI. Review of Systems  Objective: Vital Signs: Ht 5\' 4"  (1.626 m)   Wt 156 lb 3.2 oz (70.9 kg)   BMI 26.81 kg/m   Specialty Comments:  No specialty comments available.  PMFS History: Patient Active Problem List   Diagnosis Date Noted  . Septic olecranon bursitis, right   . Rash 11/08/2017  . Myalgia 11/06/2017  . Hypercalcemia 11/06/2017  . Cough 10/09/2017  . Syncope 07/12/2017  . Chest pain 02/15/2016  . Genetic testing 04/28/2015  . Hypothyroidism 04/06/2015  . Screening examination for infectious disease 04/06/2015  . Wellness examination 09/16/2014  . Lymphedema of upper extremity 07/13/2014  . Lower extremity edema 05/27/2013  . Malignant neoplasm of upper-outer quadrant of right breast in female, estrogen receptor negative ([place]) 01/01/2013  . Shortness of breath 03/31/2012  . Anxiety and depression 07/06/2011  . Encounter for long-term (current) use of other medications 03/23/2011  . Goiter 03/23/2011  .  URTICARIA 03/23/2010  . HYPERCHOLESTEROLEMIA 10/25/2008  . LEG PAIN, RIGHT 02/04/2008  . Type 1 diabetes mellitus ([facility]) 03/20/2007  . ANXIETY 03/20/2007  . DEPRESSION 03/20/2007   Past Medical History:  Diagnosis Date  . Anxiety   . Anxiety and depression 07/06/2011  . Breast cancer ([place]) 01/01/13   /metastatic 1/1 lymph nodes, chemo, radiation, surgery  . Depression   . Diabetes mellitus    Type I  . Dyslipidemia   . Hypothyroidism   . Lower extremity edema   . Lymphadenopathy, supraclavicular   . PONV (postoperative nausea and vomiting)   . Type I diabetes mellitus (HCC)    uses insulin pump  . Venous insufficiency       Family History  Problem Relation Age of Onset  . Coronary artery disease Mother   . Congestive Heart Failure Maternal Grandmother   . Brain cancer Maternal Grandfather        dx in his early 11s  . Cancer Neg Hx   . Diabetes Neg Hx     Past Surgical History:  Procedure Laterality Date  . BREAST BIOPSY Right 01/01/13   Invasive mammary ca,metastatic in 1/1 lymph node  . BREAST BIOPSY Left 01/09/13   Fibroadenoma,microcalcifications  . CESAREAN SECTION  1989  . I&D EXTREMITY Right 05/07/2018   Procedure: BURSECTOMY RIGHT ELBOW;  Surgeon: Newt Minion, MD;  Location: [facility];  Service: Orthopedics;  Laterality: Right;  . LUMBAR DISC SURGERY  2009   Dr Maxie Better  . MASTECTOMY COMPLETE / SIMPLE Left 06/09/2013  . MASTECTOMY MODIFIED RADICAL Right 06/09/2013  . MASTECTOMY MODIFIED RADICAL Right 06/09/2013   Procedure: RIGHT MASTECTOMY MODIFIED RADICAL;  Surgeon: Adin Hector, MD;  Location: [facility];  Service: General;  Laterality: Right;  . PORTACATH PLACEMENT Left 01/2013  . SIMPLE MASTECTOMY WITH AXILLARY SENTINEL NODE BIOPSY Left 06/09/2013   Procedure: LEFT SIMPLE MASTECTOMY;  Surgeon: Adin Hector, MD;  Location: [facility];  Service: General;  Laterality: Left;  . TUBAL LIGATION  1991   Social History   Occupational History    Employer: [employer]   Tobacco Use  . Smoking status: Never Smoker  . Smokeless tobacco: Never Used  Substance and Sexual Activity  . Alcohol use: No  . Drug use: No  . Sexual activity: Yes

## 2018-10-16 ENCOUNTER — Other Ambulatory Visit: Payer: Self-pay | Admitting: Endocrinology

## 2018-10-24 ENCOUNTER — Other Ambulatory Visit: Payer: Self-pay | Admitting: Endocrinology

## 2018-10-27 ENCOUNTER — Telehealth: Payer: Self-pay | Admitting: Endocrinology

## 2018-10-27 ENCOUNTER — Other Ambulatory Visit: Payer: Self-pay

## 2018-10-27 DIAGNOSIS — Z01419 Encounter for gynecological examination (general) (routine) without abnormal findings: Secondary | ICD-10-CM | POA: Diagnosis not present

## 2018-10-27 DIAGNOSIS — E108 Type 1 diabetes mellitus with unspecified complications: Secondary | ICD-10-CM

## 2018-10-27 DIAGNOSIS — Z124 Encounter for screening for malignant neoplasm of cervix: Secondary | ICD-10-CM | POA: Diagnosis not present

## 2018-10-27 DIAGNOSIS — L930 Discoid lupus erythematosus: Secondary | ICD-10-CM | POA: Insufficient documentation

## 2018-10-27 MED ORDER — GLUCOSE BLOOD VI STRP: ORAL_STRIP | 2 refills | Status: DC

## 2018-10-27 NOTE — Telephone Encounter (Signed)
Patient is calling to find out why her prescription was denied by Dr Loanne Drilling   CONTOUR NEXT TEST test strip

## 2018-10-27 NOTE — Telephone Encounter (Signed)
Returned pt call. Informed we have not rejected the refill of her strips. Asked that she call the pharmacy for clarification re: "rejection". Is this related to an insurance issue; refill too soon, exceeds quantity limits per plan, insurance requires an alternative? Verbalized understanding. States she will f/u.

## 2018-11-03 ENCOUNTER — Ambulatory Visit: Payer: 59 | Admitting: Endocrinology

## 2018-11-20 ENCOUNTER — Other Ambulatory Visit: Payer: Self-pay | Admitting: Endocrinology

## 2018-12-10 DIAGNOSIS — M5136 Other intervertebral disc degeneration, lumbar region: Secondary | ICD-10-CM | POA: Diagnosis not present

## 2018-12-10 DIAGNOSIS — M546 Pain in thoracic spine: Secondary | ICD-10-CM | POA: Diagnosis not present

## 2018-12-10 DIAGNOSIS — M5442 Lumbago with sciatica, left side: Secondary | ICD-10-CM | POA: Diagnosis not present

## 2018-12-11 DIAGNOSIS — M5442 Lumbago with sciatica, left side: Secondary | ICD-10-CM | POA: Diagnosis not present

## 2018-12-11 DIAGNOSIS — M546 Pain in thoracic spine: Secondary | ICD-10-CM | POA: Diagnosis not present

## 2018-12-11 DIAGNOSIS — M5136 Other intervertebral disc degeneration, lumbar region: Secondary | ICD-10-CM | POA: Diagnosis not present

## 2018-12-15 DIAGNOSIS — M546 Pain in thoracic spine: Secondary | ICD-10-CM | POA: Diagnosis not present

## 2018-12-15 DIAGNOSIS — M5136 Other intervertebral disc degeneration, lumbar region: Secondary | ICD-10-CM | POA: Diagnosis not present

## 2018-12-15 DIAGNOSIS — M5442 Lumbago with sciatica, left side: Secondary | ICD-10-CM | POA: Diagnosis not present

## 2018-12-16 DIAGNOSIS — M5442 Lumbago with sciatica, left side: Secondary | ICD-10-CM | POA: Diagnosis not present

## 2018-12-16 DIAGNOSIS — M546 Pain in thoracic spine: Secondary | ICD-10-CM | POA: Diagnosis not present

## 2018-12-16 DIAGNOSIS — M5136 Other intervertebral disc degeneration, lumbar region: Secondary | ICD-10-CM | POA: Diagnosis not present

## 2018-12-18 DIAGNOSIS — M546 Pain in thoracic spine: Secondary | ICD-10-CM | POA: Diagnosis not present

## 2018-12-18 DIAGNOSIS — M5442 Lumbago with sciatica, left side: Secondary | ICD-10-CM | POA: Diagnosis not present

## 2018-12-18 DIAGNOSIS — M5136 Other intervertebral disc degeneration, lumbar region: Secondary | ICD-10-CM | POA: Diagnosis not present

## 2018-12-22 ENCOUNTER — Other Ambulatory Visit: Payer: Self-pay | Admitting: Endocrinology

## 2018-12-27 ENCOUNTER — Other Ambulatory Visit: Payer: Self-pay | Admitting: Endocrinology

## 2018-12-28 NOTE — Telephone Encounter (Signed)
Please refill x 1 F/u is due  

## 2019-01-12 ENCOUNTER — Other Ambulatory Visit: Payer: Self-pay | Admitting: Endocrinology

## 2019-01-13 ENCOUNTER — Other Ambulatory Visit: Payer: Self-pay

## 2019-01-13 ENCOUNTER — Ambulatory Visit (INDEPENDENT_AMBULATORY_CARE_PROVIDER_SITE_OTHER): Payer: 59 | Admitting: Endocrinology

## 2019-01-13 DIAGNOSIS — E1065 Type 1 diabetes mellitus with hyperglycemia: Secondary | ICD-10-CM

## 2019-01-13 DIAGNOSIS — E039 Hypothyroidism, unspecified: Secondary | ICD-10-CM | POA: Diagnosis not present

## 2019-01-13 MED ORDER — LEVOTHYROXINE SODIUM 88 MCG PO TABS: 88.0000 ug | ORAL_TABLET | Freq: Every day | ORAL | 3 refills | Status: DC

## 2019-01-13 NOTE — Patient Instructions (Addendum)
Please continue these pump settings:  basal rate of 1.7 units/HR, 24 HRS per day.  mealtime bolus of 1 unit/8 grams carbohydrate.  correction bolus (which some people call "sensitivity," or "insulin sensitivity ratio," or just "isr") of 1 unit for each 40 by which your glucose exceeds 100).  Please do the blood tests at [facility] in [place].   Please come back for a follow-up appointment in 4 months.

## 2019-01-13 NOTE — Progress Notes (Signed)
Subjective:    Patient ID: Jasmine Schwartz, female    DOB: 01/13/1966, 53 y.o.   MRN: [id number]  HPI telehealth visit today via doxy video visit.  Alternatives to telehealth are presented to this patient, and the patient agrees to the telehealth visit.  Pt is advised of the cost of the visit, and agrees to this, also.   Patient is in her car, and I am at the office.   Persons attending the telehealth visit: the patient and I Pt returns for f/u of diabetes mellitus:  DM type: 1 Dx'ed: 5631 Complications: none.  Therapy: insulin since dx DKA: never Severe hypoglycemia: never Pancreatitis: never Other: she has been on pump rx since 2004; she declined continuous glucose monitor, due to cost; she has finished rx for her breast cancer, for now.  She has been intermittently on steroids for SLE. Interval history:  She takes these settings:  basal rate of 1.7 units/HR, 24 HRS per day.  mealtime bolus of 1 unit/8 grams carbohydrate.  correction bolus (which some people call "sensitivity," or "insulin sensitivity ratio," or just "isr") of 1 unit for each 40 by which your glucose exceeds 100).   No recent steroids.  She says cbg varies from 100-200.  There is no trend throughout the day.   TDD is approx 60 units/day.   Past Medical History:  Diagnosis Date  . Anxiety   . Anxiety and depression 07/06/2011  . Breast cancer ([facility]) 01/01/13   /metastatic 1/1 lymph nodes, chemo, radiation, surgery  . Depression   . Diabetes mellitus    Type I  . Dyslipidemia   . Hypothyroidism   . Lower extremity edema   . Lymphadenopathy, supraclavicular   . PONV (postoperative nausea and vomiting)   . Type I diabetes mellitus (HCC)    uses insulin pump  . Venous insufficiency     Past Surgical History:  Procedure Laterality Date  . BREAST BIOPSY Right 01/01/13   Invasive mammary ca,metastatic in 1/1 lymph node  . BREAST BIOPSY Left 01/09/13   Fibroadenoma,microcalcifications  . CESAREAN SECTION  1989   . I&D EXTREMITY Right 05/07/2018   Procedure: BURSECTOMY RIGHT ELBOW;  Surgeon: Newt Minion, MD;  Location: [facility];  Service: Orthopedics;  Laterality: Right;  . LUMBAR DISC SURGERY  2009   Dr Maxie Better  . MASTECTOMY COMPLETE / SIMPLE Left 06/09/2013  . MASTECTOMY MODIFIED RADICAL Right 06/09/2013  . MASTECTOMY MODIFIED RADICAL Right 06/09/2013   Procedure: RIGHT MASTECTOMY MODIFIED RADICAL;  Surgeon: Adin Hector, MD;  Location: [facility];  Service: General;  Laterality: Right;  . PORTACATH PLACEMENT Left 01/2013  . SIMPLE MASTECTOMY WITH AXILLARY SENTINEL NODE BIOPSY Left 06/09/2013   Procedure: LEFT SIMPLE MASTECTOMY;  Surgeon: Adin Hector, MD;  Location: [facility];  Service: General;  Laterality: Left;  . TUBAL LIGATION  1991    Social History   Socioeconomic History  . Marital status: Married    Spouse name: Not on file  . Number of children: 2  . Years of education: Not on file  . Highest education level: Not on file  Occupational History    Employer: [employer]   Social Needs  . Financial resource strain: Not on file  . Food insecurity:    Worry: Not on file    Inability: Not on file  . Transportation needs:    Medical: Not on file    Non-medical: Not on file  Tobacco Use  . Smoking status: Never Smoker  .  Smokeless tobacco: Never Used  Substance and Sexual Activity  . Alcohol use: No  . Drug use: No  . Sexual activity: Yes  Lifestyle  . Physical activity:    Days per week: Not on file    Minutes per session: Not on file  . Stress: Not on file  Relationships  . Social connections:    Talks on phone: Not on file    Gets together: Not on file    Attends religious service: Not on file    Active member of club or organization: Not on file    Attends meetings of clubs or organizations: Not on file    Relationship status: Not on file  . Intimate partner violence:    Fear of current or ex partner: Not on file    Emotionally abused: Not on file    Physically  abused: Not on file    Forced sexual activity: Not on file  Other Topics Concern  . Not on file  Social History Narrative  . Not on file    Current Outpatient Medications on File Prior to Visit  Medication Sig Dispense Refill  . Ascorbic Acid (VITAMIN C) 1000 MG tablet Take 2,000 mg by mouth 2 (two) times daily.    . Continuous Blood Gluc Sensor (FREESTYLE LIBRE 14 DAY SENSOR) MISC 1 Device by Does not apply route daily. Use one device every 14 days to check blood sugars. DX code E10.9. 3 each 11  . glucose blood (CONTOUR NEXT TEST) test strip TEST BLOOD SUGAR 5 TIMES A DAY 150 each 2  . hydroxychloroquine (PLAQUENIL) 200 MG tablet Take 200 mg by mouth 2 (two) times daily.    Marland Kitchen ibuprofen (ADVIL,MOTRIN) 200 MG tablet Take 400 mg by mouth 2 (two) times daily as needed for headache or moderate pain.    Marland Kitchen insulin lispro (HUMALOG) 100 UNIT/ML injection ADMINISTER 60 UNITS VIA INSULIN PUMP DAILY 20 mL 0  . Lancets (ACCU-CHEK MULTICLIX) lancets Use to check blood sugar 5 times per day 200 each 2   No current facility-administered medications on file prior to visit.     Allergies  Allergen Reactions  . Doxycycline Rash    Family History  Problem Relation Age of Onset  . Coronary artery disease Mother   . Congestive Heart Failure Maternal Grandmother   . Brain cancer Maternal Grandfather        dx in his early 38s  . Cancer Neg Hx   . Diabetes Neg Hx     Review of Systems She denies hypoglycemia.      Objective:   Physical Exam  Lab Results  Component Value Date   CREATININE 0.69 05/07/2018   BUN 22 (H) 05/07/2018   NA 134 (L) 05/07/2018   K 4.2 05/07/2018   CL 100 05/07/2018   CO2 23 05/07/2018       Assessment & Plan:  Type 1 DM: uncertain exact glycemic control.   Hypothyroidism: due for recheck Hypercalcemia: due for recheck.  Patient Instructions  Please continue these pump settings:  basal rate of 1.7 units/HR, 24 HRS per day.  mealtime bolus of 1 unit/8  grams carbohydrate.  correction bolus (which some people call "sensitivity," or "insulin sensitivity ratio," or just "isr") of 1 unit for each 40 by which your glucose exceeds 100).  Please do the blood tests at [facility] in [place].   Please come back for a follow-up appointment in 4 months.

## 2019-01-28 ENCOUNTER — Other Ambulatory Visit: Payer: Self-pay | Admitting: Endocrinology

## 2019-01-28 DIAGNOSIS — E108 Type 1 diabetes mellitus with unspecified complications: Secondary | ICD-10-CM

## 2019-02-28 ENCOUNTER — Other Ambulatory Visit: Payer: Self-pay | Admitting: Endocrinology

## 2019-03-09 ENCOUNTER — Encounter: Payer: Self-pay | Admitting: Gastroenterology

## 2019-03-27 ENCOUNTER — Other Ambulatory Visit: Payer: Self-pay | Admitting: Endocrinology

## 2019-04-20 ENCOUNTER — Other Ambulatory Visit: Payer: Self-pay | Admitting: Endocrinology

## 2019-04-20 DIAGNOSIS — E108 Type 1 diabetes mellitus with unspecified complications: Secondary | ICD-10-CM

## 2019-04-20 NOTE — Telephone Encounter (Signed)
Please refill x 1 F/u is due  

## 2019-05-13 ENCOUNTER — Other Ambulatory Visit: Payer: Self-pay | Admitting: Internal Medicine

## 2019-06-10 ENCOUNTER — Telehealth: Payer: Self-pay | Admitting: Endocrinology

## 2019-06-10 ENCOUNTER — Other Ambulatory Visit: Payer: Self-pay

## 2019-06-10 NOTE — Telephone Encounter (Signed)
Patient states that [facility] sent Dr. Loanne Drilling a request for Patient's pump supplies on 05/23/19 but [facility] has not heard back from Dr. Loanne Drilling. Patient requests that RX's for all pump supplies be sent to [facility]. Patient is almost out of all pump supplies (reservoir and infusion sites). Please call patient at ph# [phone] to advise on the above.

## 2019-06-10 NOTE — Telephone Encounter (Signed)
No paperwork has been received from [facility] to date. Returned pt call with intention to advise her to call [facility] and to ask that they ensure they are faxing to the correct fax # [phone]) and if they are not using the correct fax # OR are using the correct fax #, please refax. LVM requesting returned call

## 2019-06-10 NOTE — Telephone Encounter (Signed)
Spoke with patient and informed her of message below from [facility]. And patient is [facility] with fax number and asking them to refax paperwork.

## 2019-06-12 ENCOUNTER — Other Ambulatory Visit: Payer: Self-pay

## 2019-06-12 ENCOUNTER — Encounter: Payer: Self-pay | Admitting: Endocrinology

## 2019-06-12 ENCOUNTER — Ambulatory Visit: Payer: 59 | Admitting: Endocrinology

## 2019-06-12 VITALS — BP 124/82 | HR 91 | Ht 64.0 in | Wt 169.6 lb

## 2019-06-12 DIAGNOSIS — Z634 Disappearance and death of family member: Secondary | ICD-10-CM | POA: Diagnosis not present

## 2019-06-12 DIAGNOSIS — E1065 Type 1 diabetes mellitus with hyperglycemia: Secondary | ICD-10-CM

## 2019-06-12 LAB — POCT GLYCOSYLATED HEMOGLOBIN (HGB A1C): Hemoglobin A1C: 7.5 % — AB (ref 4.0–5.6)

## 2019-06-12 LAB — BASIC METABOLIC PANEL
BUN: 21 mg/dL (ref 6–23)
CO2: 29 mEq/L (ref 19–32)
Calcium: 9.3 mg/dL (ref 8.4–10.5)
Chloride: 101 mEq/L (ref 96–112)
Creatinine, Ser: 0.75 mg/dL (ref 0.40–1.20)
GFR: 80.47 mL/min (ref >60.00)
Glucose, Bld: 157 mg/dL — ABNORMAL HIGH (ref 70–99)
Potassium: 3.8 mEq/L (ref 3.5–5.1)
Sodium: 137 mEq/L (ref 135–145)

## 2019-06-12 LAB — T4, FREE: Free T4: 1.29 ng/dL (ref 0.60–1.60)

## 2019-06-12 LAB — TSH: TSH: 0.17 u[IU]/mL — ABNORMAL LOW (ref 0.35–4.50)

## 2019-06-12 MED ORDER — LEVOTHYROXINE SODIUM 75 MCG PO TABS: 75.0000 ug | ORAL_TABLET | Freq: Every day | ORAL | 2 refills | Status: DC

## 2019-06-12 NOTE — Progress Notes (Signed)
Subjective:    Patient ID: Jasmine Schwartz, female    DOB: June 01, 1966, 53 y.o.   MRN: [id number]  HPI Pt returns for f/u of diabetes mellitus:  DM type: 1 Dx'ed: 0000000 Complications: none.  Therapy: insulin since dx (now [employer] paradigm).   DKA: never Severe hypoglycemia: never Pancreatitis: never Other: she has been on pump rx since 2004; she declined continuous glucose monitor, due to cost; she has finished rx for her breast cancer, for now.  She has been intermittently on steroids for SLE. Interval history:  She takes these settings:  basal rate of 1.7 units/HR, 24 HRS per day.  mealtime bolus of 1 unit/8 grams carbohydrate.  correction bolus (which some people call "sensitivity," or "insulin sensitivity ratio," or just "isr") of 1 unit for each 40 by which your glucose exceeds 100).   No recent steroids.  She says cbg varies from 50-200's.  There is no trend throughout the day.  She seldom has hypoglycemia, and these episodes are mild.  This happens after a smaller than expected meal.  She recently suffered the loss of both parents.   Pump download is unsuccessful.   Past Medical History:  Diagnosis Date  . Anxiety   . Anxiety and depression 07/06/2011  . Breast cancer ([place]) 01/01/13   /metastatic 1/1 lymph nodes, chemo, radiation, surgery  . Depression   . Diabetes mellitus    Type I  . Dyslipidemia   . Hypothyroidism   . Lower extremity edema   . Lymphadenopathy, supraclavicular   . PONV (postoperative nausea and vomiting)   . Type I diabetes mellitus (HCC)    uses insulin pump  . Venous insufficiency     Past Surgical History:  Procedure Laterality Date  . BREAST BIOPSY Right 01/01/13   Invasive mammary ca,metastatic in 1/1 lymph node  . BREAST BIOPSY Left 01/09/13   Fibroadenoma,microcalcifications  . CESAREAN SECTION  1989  . I&D EXTREMITY Right 05/07/2018   Procedure: BURSECTOMY RIGHT ELBOW;  Surgeon: Newt Minion, MD;  Location: [facility];  Service: Orthopedics;   Laterality: Right;  . LUMBAR DISC SURGERY  2009   Dr Maxie Better  . MASTECTOMY COMPLETE / SIMPLE Left 06/09/2013  . MASTECTOMY MODIFIED RADICAL Right 06/09/2013  . MASTECTOMY MODIFIED RADICAL Right 06/09/2013   Procedure: RIGHT MASTECTOMY MODIFIED RADICAL;  Surgeon: Adin Hector, MD;  Location: [facility];  Service: General;  Laterality: Right;  . PORTACATH PLACEMENT Left 01/2013  . SIMPLE MASTECTOMY WITH AXILLARY SENTINEL NODE BIOPSY Left 06/09/2013   Procedure: LEFT SIMPLE MASTECTOMY;  Surgeon: Adin Hector, MD;  Location: [facility];  Service: General;  Laterality: Left;  . TUBAL LIGATION  1991    Social History   Socioeconomic History  . Marital status: Married    Spouse name: Not on file  . Number of children: 2  . Years of education: Not on file  . Highest education level: Not on file  Occupational History    Employer: [employer]   Social Needs  . Financial resource strain: Not on file  . Food insecurity    Worry: Not on file    Inability: Not on file  . Transportation needs    Medical: Not on file    Non-medical: Not on file  Tobacco Use  . Smoking status: Never Smoker  . Smokeless tobacco: Never Used  Substance and Sexual Activity  . Alcohol use: No  . Drug use: No  . Sexual activity: Yes  Lifestyle  . Physical  activity    Days per week: Not on file    Minutes per session: Not on file  . Stress: Not on file  Relationships  . Social Herbalist on phone: Not on file    Gets together: Not on file    Attends religious service: Not on file    Active member of club or organization: Not on file    Attends meetings of clubs or organizations: Not on file    Relationship status: Not on file  . Intimate partner violence    Fear of current or ex partner: Not on file    Emotionally abused: Not on file    Physically abused: Not on file    Forced sexual activity: Not on file  Other Topics Concern  . Not on file  Social History Narrative  . Not on file     Current Outpatient Medications on File Prior to Visit  Medication Sig Dispense Refill  . Ascorbic Acid (VITAMIN C) 1000 MG tablet Take 2,000 mg by mouth 2 (two) times daily.    . CONTOUR NEXT TEST test strip USE TO TEST BLOOD GLUCOSE 5 TIMES DAILY 100 strip 1  . hydroxychloroquine (PLAQUENIL) 200 MG tablet Take 200 mg by mouth 2 (two) times daily.    Marland Kitchen ibuprofen (ADVIL,MOTRIN) 200 MG tablet Take 400 mg by mouth 2 (two) times daily as needed for headache or moderate pain.    Marland Kitchen insulin lispro (HUMALOG) 100 UNIT/ML injection ADMINISTER 60 UNITS VIA INSULIN PUMP DAILY 20 mL 0   No current facility-administered medications on file prior to visit.     Allergies  Allergen Reactions  . Doxycycline Rash    Family History  Problem Relation Age of Onset  . Coronary artery disease Mother   . Congestive Heart Failure Maternal Grandmother   . Brain cancer Maternal Grandfather        dx in his early 19s  . Cancer Neg Hx   . Diabetes Neg Hx     BP 124/82 (BP Location: Left Arm, Patient Position: Sitting, Cuff Size: Normal)   Pulse 91   Ht 5\' 4"  (1.626 m)   Wt 169 lb 9.6 oz (76.9 kg)   SpO2 98%   BMI 29.11 kg/m    Review of Systems Denies LOC.     Objective:   Physical Exam VITAL SIGNS:  See vs page GENERAL: no distress Pulses: dorsalis pedis intact bilat.   MSK: no deformity of the feet CV: no leg edema Skin:  no ulcer on the feet.  normal color and temp on the feet. Neuro: sensation is intact to touch on the feet  Lab Results  Component Value Date   HGBA1C 7.5 (A) 06/12/2019        Assessment & Plan:  Type 1 DM.  Bereavement: we discussed.  Pt says this is affecting glycemic control, so she declines to increase pump settings.  Hypoglycemia: this limits aggressiveness of glycemic control  Patient Instructions  Please continue these pump settings:  basal rate of 1.8 units/HR, 24 HRS per day.  mealtime bolus of 1 unit/8 grams carbohydrate.  correction bolus (which some  people call "sensitivity," or "insulin sensitivity ratio," or just "isr") of 1 unit for each 40 by which your glucose exceeds 100).   Blood tests are requested for you today.  We'll let you know about the results.  Please come back for a follow-up appointment in 4 months.

## 2019-06-12 NOTE — Patient Instructions (Addendum)
Please continue these pump settings:  basal rate of 1.8 units/HR, 24 HRS per day.  mealtime bolus of 1 unit/8 grams carbohydrate.  correction bolus (which some people call "sensitivity," or "insulin sensitivity ratio," or just "isr") of 1 unit for each 40 by which your glucose exceeds 100).   Blood tests are requested for you today.  We'll let you know about the results.  Please come back for a follow-up appointment in 4 months.   

## 2019-06-13 ENCOUNTER — Other Ambulatory Visit: Payer: Self-pay | Admitting: Internal Medicine

## 2019-06-15 ENCOUNTER — Telehealth: Payer: Self-pay | Admitting: *Deleted

## 2019-06-15 ENCOUNTER — Telehealth: Payer: Self-pay

## 2019-06-15 NOTE — Telephone Encounter (Signed)
Patient is scheduled for a colonoscopy with Dr.Jacobs on 07/06/2019. She has an insulin pump, PCP is Dr.Ellsion. please fax insulin pump instructions. PV is on 06/22/2019. Thank you!

## 2019-06-15 NOTE — Telephone Encounter (Signed)
[name] Mar 06, 1966 [id number]  Dear Dr. :Dr Loanne Drilling   Owens Loffler, MD has scheduled the above patient for a colonoscopy  at [facility] on 07/06/2019.  Our records show that he/she is on insulin therapy via an insulin pump.  Our colonoscopy prep protocol requires that:  the patient must be on a clear liquid diet the entire day prior to the procedure date as well as the morning of the procedure  the patient must be NPO for 2 hours prior to the procedure   the patient must consume a PEG 3350 solution to prepare for the procedure.  Please advise [place] of any adjustments that need to be made to the patient's insulin pump therapy prior to the above procedure date.    Please route or fax back this completed form to me at [phone] .  If you have any question, please call me at [phone].  Thank you for your help with this matter.  Sincerely,

## 2019-06-15 NOTE — Telephone Encounter (Signed)
Insulin pump instructions have been routed and faxed to Dr Loanne Drilling.

## 2019-06-16 NOTE — Telephone Encounter (Signed)
Patient was notified by Claiborne Billings with Dr. Cordelia Pen instructions for her insulin pump. Patient verbalizes understanding.

## 2019-06-16 NOTE — Telephone Encounter (Signed)
Called pt and informed her about the insulin pump recommendations as set forth by Dr. Loanne Drilling. Verbalized acceptance and understanding of these instructions. Advised pt that I will also be forwarding this information to Dr. Ardis Hughs for the purpose of continuity of care. Verbalized acceptance and understanding as well.

## 2019-06-16 NOTE — Telephone Encounter (Signed)
Day before and up to the procedure: Take usual basal rate and correction boluses, but no mealtime boluses.  After the procedure, resume usual settings.

## 2019-06-19 ENCOUNTER — Other Ambulatory Visit: Payer: Self-pay | Admitting: Endocrinology

## 2019-06-19 DIAGNOSIS — E108 Type 1 diabetes mellitus with unspecified complications: Secondary | ICD-10-CM

## 2019-06-22 ENCOUNTER — Ambulatory Visit: Payer: 59

## 2019-06-25 ENCOUNTER — Inpatient Hospital Stay: Payer: 59 | Attending: Adult Health | Admitting: Adult Health

## 2019-06-25 ENCOUNTER — Encounter: Payer: Self-pay | Admitting: Adult Health

## 2019-06-25 ENCOUNTER — Telehealth: Payer: Self-pay

## 2019-06-25 NOTE — Telephone Encounter (Signed)
Message was left on patient's voice mail concerning missed appointment.  Letter also mailed to patient.

## 2019-06-30 NOTE — Progress Notes (Unsigned)
Patient cancelled procedure.

## 2019-07-06 ENCOUNTER — Encounter: Payer: 59 | Admitting: Gastroenterology

## 2019-07-14 ENCOUNTER — Telehealth: Payer: Self-pay

## 2019-07-14 NOTE — Telephone Encounter (Signed)
Called in to see if paperwork had been received

## 2019-07-15 NOTE — Telephone Encounter (Signed)
After discussing with Olen Cordial and Melissa along with communication responses received below, paperwork has not been received. Will process in a timely manner once [employer] sends it to our office.

## 2019-07-15 NOTE — Telephone Encounter (Signed)
I have not seen anything

## 2019-07-15 NOTE — Telephone Encounter (Signed)
No I have not seen anything for this pt.

## 2019-07-15 NOTE — Telephone Encounter (Signed)
I have not been here since 07/08/19. Have any of you seen this paperwork?

## 2019-07-16 ENCOUNTER — Telehealth: Payer: Self-pay

## 2019-07-16 NOTE — Telephone Encounter (Signed)
SECOND ATTEMPT:  Once again, received failed fax confirmation. If continued efforts continue to fail, will have no other option but mail documents to address provided on forms.

## 2019-07-16 NOTE — Telephone Encounter (Signed)
FOURTH ATTEMPT:  Company: [employer]  Document: CMN and office notes Other records requested: office notes  All above requested information has been faxed successfully to [employer] listed above. Documents and fax confirmation have been placed in the faxed file for future reference.

## 2019-07-16 NOTE — Telephone Encounter (Signed)
[employer] CMN received and completed by Dr. Loanne Drilling. 1st attempt to fax completed CMN and office notes was unsuccessful. Immediately attempted to fax a second time. Efforts will continue until confirmation has been received.

## 2019-07-16 NOTE — Telephone Encounter (Signed)
THIRD ATTEMPT:  Call received by front desk (unknown staff member from [employer]) following up on status of paperwork. [employer] staff member was informed about the 2 failed attempts made below. Provided alternate [phone]. Once again, all documents INCLUDING 2 failed fax confirmations have been sent to the newly provided fax # above. Will continue to await response of fax that was sent.

## 2019-07-16 NOTE — Telephone Encounter (Signed)
FAILED THIRD ATTEMPT:  Failed fax confirmation receipt received. No further efforts will be tried at this time. Will attempt to fax after hours when [employer]'s incoming fax volume is much lower and likely to be successful.

## 2019-07-19 ENCOUNTER — Other Ambulatory Visit: Payer: Self-pay | Admitting: Endocrinology

## 2019-08-10 ENCOUNTER — Other Ambulatory Visit: Payer: Self-pay

## 2019-08-10 DIAGNOSIS — E108 Type 1 diabetes mellitus with unspecified complications: Secondary | ICD-10-CM

## 2019-08-10 MED ORDER — CONTOUR NEXT TEST VI STRP: ORAL_STRIP | 11 refills | Status: DC

## 2019-08-24 ENCOUNTER — Other Ambulatory Visit: Payer: Self-pay

## 2019-08-24 ENCOUNTER — Telehealth: Payer: Self-pay | Admitting: Endocrinology

## 2019-08-24 DIAGNOSIS — E1065 Type 1 diabetes mellitus with hyperglycemia: Secondary | ICD-10-CM

## 2019-08-24 MED ORDER — INSULIN LISPRO 100 UNIT/ML ~~LOC~~ SOLN: SUBCUTANEOUS | 0 refills | Status: DC

## 2019-08-24 NOTE — Telephone Encounter (Signed)
Outpatient Medication Detail   Disp Refills Start End   insulin lispro (HUMALOG) 100 UNIT/ML injection 60 mL 0 08/24/2019    Sig: ADMINISTER 60 UNITS VIA INSULIN PUMP EVERY DAY   Sent to pharmacy as: insulin lispro (HUMALOG) 100 UNIT/ML injection   E-Prescribing Status: Receipt confirmed by pharmacy (08/24/2019 11:09 AM EST)

## 2019-08-24 NOTE — Telephone Encounter (Signed)
Patient was told by [facility] that Dr. Loanne Drilling would not refill the medication below until patient scheduled appointment, however patient states she made her follow up appointment at her last visit and requests the following RX with refills:  MEDICATION: insulin lispro (HUMALOG) 100 UNIT/ML injection with refills  PHARMACY:   [place], [place] - [address] AT [address] Phone:  [phone]  Fax:  [phone]      IS THIS A 90 DAY SUPPLY : No  IS PATIENT OUT OF MEDICATION: No  IF NOT; HOW MUCH IS LEFT: approx. 1 week  LAST APPOINTMENT DATE: @12 /28/2020  NEXT APPOINTMENT DATE:@3 /09/2019  DO WE HAVE YOUR PERMISSION TO LEAVE A DETAILED MESSAGE: Yes  OTHER COMMENTS:    **Let patient know to contact pharmacy at the end of the day to make sure medication is ready. **  ** Please notify patient to allow 48-72 hours to process**  **Encourage patient to contact the pharmacy for refills or they can request refills through [facility]**

## 2019-10-05 ENCOUNTER — Telehealth: Payer: Self-pay | Admitting: Nutrition

## 2019-10-07 NOTE — Telephone Encounter (Signed)
Message left to call me to set up an appointment for pump start

## 2019-10-13 ENCOUNTER — Encounter: Payer: 59 | Admitting: Nutrition

## 2019-10-13 ENCOUNTER — Ambulatory Visit: Payer: 59 | Admitting: Endocrinology

## 2019-11-05 ENCOUNTER — Ambulatory Visit: Payer: 59 | Admitting: Endocrinology

## 2019-11-23 ENCOUNTER — Other Ambulatory Visit: Payer: Self-pay

## 2019-11-24 ENCOUNTER — Other Ambulatory Visit: Payer: Self-pay

## 2019-11-24 ENCOUNTER — Ambulatory Visit: Payer: 59 | Admitting: Endocrinology

## 2019-11-24 DIAGNOSIS — E1065 Type 1 diabetes mellitus with hyperglycemia: Secondary | ICD-10-CM

## 2019-11-24 NOTE — Patient Instructions (Signed)
Please continue these pump settings:  basal rate of 1.8 units/HR, 24 HRS per day.  mealtime bolus of 1 unit/8 grams carbohydrate.  correction bolus (which some people call "sensitivity," or "insulin sensitivity ratio," or just "isr") of 1 unit for each 40 by which your glucose exceeds 100).   Blood tests are requested for you today.  We'll let you know about the results.  Please come back for a follow-up appointment in 4 months.

## 2019-11-24 NOTE — Progress Notes (Signed)
   Subjective:    Patient ID: Jasmine Schwartz, female    DOB: Jun 07, 1966, 54 y.o.   MRN: [id number]  HPI Called x 2.  No answer   Review of Systems     Objective:   Physical Exam       Assessment & Plan:

## 2019-11-29 ENCOUNTER — Other Ambulatory Visit: Payer: Self-pay | Admitting: Endocrinology

## 2019-11-29 DIAGNOSIS — E1065 Type 1 diabetes mellitus with hyperglycemia: Secondary | ICD-10-CM

## 2019-11-29 NOTE — Telephone Encounter (Signed)
1.  Please schedule f/u appt 2.  Then please refill x 1, pending that appt.  

## 2019-12-01 ENCOUNTER — Other Ambulatory Visit: Payer: Self-pay

## 2019-12-01 ENCOUNTER — Encounter: Payer: Self-pay | Admitting: Endocrinology

## 2019-12-01 ENCOUNTER — Ambulatory Visit: Payer: 59 | Admitting: Endocrinology

## 2019-12-01 VITALS — BP 118/70 | HR 76 | Ht 64.0 in | Wt 177.0 lb

## 2019-12-01 DIAGNOSIS — E1065 Type 1 diabetes mellitus with hyperglycemia: Secondary | ICD-10-CM

## 2019-12-01 LAB — POCT GLYCOSYLATED HEMOGLOBIN (HGB A1C): Hemoglobin A1C: 7.8 % — AB (ref 4.0–5.6)

## 2019-12-01 LAB — TSH: TSH: 4.76 u[IU]/mL — ABNORMAL HIGH (ref 0.35–4.50)

## 2019-12-01 NOTE — Progress Notes (Signed)
Subjective:    Patient ID: Jasmine Schwartz, female    DOB: [birth year], 54 y.o.   MRN: [id number]  HPI Pt returns for f/u of diabetes mellitus:  DM type: 1 Dx'ed: 0000000 Complications: none.  Therapy: insulin since dx (now [employer] 630G).   DKA: never Severe hypoglycemia: never Pancreatitis: never SDOH: she declined continuous glucose monitor, due to cost.   Other: she has been on pump rx since 2004; she has finished rx for her breast cancer, for now.  She has been intermittently on steroids for SLE.   Interval history:  She takes these settings:  basal rate of 1.3 units/HR, MN-6AM; 1.2 units/HR, 6AM-1:30PM; 1.45 units/HR, 1:30PM-MN.   mealtime bolus of 1 unit/approx 12 grams carbohydrate.  correction bolus (which some people call "sensitivity," or "insulin sensitivity ratio," or just "isr") of 1 unit for each 40 by which your glucose exceeds 100).   TDD is 50 units (approx 60% basal) No recent steroids.  Meter is downloaded today, and the printout is scanned into the record.  cbg varies from 56-319.  It is in general higher as the day goes on.  She seldom has hypoglycemia, and these episodes are mild.  This usually happens in the middle of the night.   Past Medical History:  Diagnosis Date  . Anxiety   . Anxiety and depression 07/06/2011  . Breast cancer ([place]) 01/01/13   /metastatic 1/1 lymph nodes, chemo, radiation, surgery  . Depression   . Diabetes mellitus    Type I  . Dyslipidemia   . Hypothyroidism   . Lower extremity edema   . Lymphadenopathy, supraclavicular   . PONV (postoperative nausea and vomiting)   . Type I diabetes mellitus (HCC)    uses insulin pump  . Venous insufficiency     Past Surgical History:  Procedure Laterality Date  . BREAST BIOPSY Right 01/01/13   Invasive mammary ca,metastatic in 1/1 lymph node  . BREAST BIOPSY Left 01/09/13   Fibroadenoma,microcalcifications  . CESAREAN SECTION  1989  . I & D EXTREMITY Right 05/07/2018   Procedure: BURSECTOMY  RIGHT ELBOW;  Surgeon: Newt Minion, MD;  Location: [place];  Service: Orthopedics;  Laterality: Right;  . LUMBAR DISC SURGERY  2009   Dr Maxie Better  . MASTECTOMY COMPLETE / SIMPLE Left 06/09/2013  . MASTECTOMY MODIFIED RADICAL Right 06/09/2013  . MASTECTOMY MODIFIED RADICAL Right 06/09/2013   Procedure: RIGHT MASTECTOMY MODIFIED RADICAL;  Surgeon: Adin Hector, MD;  Location: [facility];  Service: General;  Laterality: Right;  . PORTACATH PLACEMENT Left 01/2013  . SIMPLE MASTECTOMY WITH AXILLARY SENTINEL NODE BIOPSY Left 06/09/2013   Procedure: LEFT SIMPLE MASTECTOMY;  Surgeon: Adin Hector, MD;  Location: [place];  Service: General;  Laterality: Left;  . TUBAL LIGATION  1991    Social History   Socioeconomic History  . Marital status: Married    Spouse name: Not on file  . Number of children: 2  . Years of education: Not on file  . Highest education level: Not on file  Occupational History    Employer: [employer]   Tobacco Use  . Smoking status: Never Smoker  . Smokeless tobacco: Never Used  Substance and Sexual Activity  . Alcohol use: No  . Drug use: No  . Sexual activity: Yes  Other Topics Concern  . Not on file  Social History Narrative  . Not on file   Social Determinants of Health   Financial Resource Strain:   . Difficulty  of Paying Living Expenses:   Food Insecurity:   . Worried About Charity fundraiser in the Last Year:   . Arboriculturist in the Last Year:   Transportation Needs:   . Film/video editor (Medical):   Marland Kitchen Lack of Transportation (Non-Medical):   Physical Activity:   . Days of Exercise per Week:   . Minutes of Exercise per Session:   Stress:   . Feeling of Stress :   Social Connections:   . Frequency of Communication with Friends and Family:   . Frequency of Social Gatherings with Friends and Family:   . Attends Religious Services:   . Active Member of Clubs or Organizations:   . Attends Archivist Meetings:   Marland Kitchen Marital  Status:   Intimate Partner Violence:   . Fear of Current or Ex-Partner:   . Emotionally Abused:   Marland Kitchen Physically Abused:   . Sexually Abused:     Current Outpatient Medications on File Prior to Visit  Medication Sig Dispense Refill  . Ascorbic Acid (VITAMIN C) 1000 MG tablet Take 2,000 mg by mouth 2 (two) times daily.    Marland Kitchen glucose blood (CONTOUR NEXT TEST) test strip TEST BLOOD SUGAR 5 TIMES A DAY 100 strip 11  . hydroxychloroquine (PLAQUENIL) 200 MG tablet Take 200 mg by mouth 2 (two) times daily.    Marland Kitchen ibuprofen (ADVIL,MOTRIN) 200 MG tablet Take 400 mg by mouth 2 (two) times daily as needed for headache or moderate pain.    Marland Kitchen insulin lispro (HUMALOG) 100 UNIT/ML injection ADMINISTER 60 UNITS VIA INSULIN PUMP EVERY DAY 60 mL 0  . levothyroxine (SYNTHROID) 75 MCG tablet Take 1 tablet (75 mcg total) by mouth daily before breakfast. 90 tablet 2   No current facility-administered medications on file prior to visit.    Allergies  Allergen Reactions  . Doxycycline Rash    Family History  Problem Relation Age of Onset  . Coronary artery disease Mother   . Congestive Heart Failure Maternal Grandmother   . Brain cancer Maternal Grandfather        dx in his early 64s  . Cancer Neg Hx   . Diabetes Neg Hx     BP 118/70   Pulse 76   Ht 5\' 4"  (1.626 m)   Wt 177 lb (80.3 kg)   SpO2 99%   BMI 30.38 kg/m    Review of Systems Denies LOC    Objective:   Physical Exam VITAL SIGNS:  See vs page GENERAL: no distress Pulses: dorsalis pedis intact bilat.   MSK: no deformity of the feet CV: no leg edema.   Skin:  no ulcer on the feet, but there are several calluses.  normal color and temp on the feet.  Neuro: sensation is intact to touch on the feet, but decreased from normal.    Lab Results  Component Value Date   HGBA1C 7.8 (A) 12/01/2019       Assessment & Plan:  Type 1 DM, worse: She would benefit from increased rx, if it can be done with a regimen that avoids or minimizes  hypoglycemia.  Hypoglycemia: this limits aggressiveness of glycemic control.   Patient Instructions  Please continue these pump settings:  basal rate of 1.2 units/HR mealtime bolus of 1 unit/approx 8 grams carbohydrate.   correction bolus (which some people call "sensitivity," or "insulin sensitivity ratio," or just "isr") of 1 unit for each 40 by which your glucose exceeds 100).  Blood tests are requested for you today.  We'll let you know about the results.  Please come back for a follow-up appointment in 2-3 months.

## 2019-12-01 NOTE — Patient Instructions (Addendum)
Please continue these pump settings:  basal rate of 1.2 units/HR mealtime bolus of 1 unit/approx 8 grams carbohydrate.   correction bolus (which some people call "sensitivity," or "insulin sensitivity ratio," or just "isr") of 1 unit for each 40 by which your glucose exceeds 100).   Blood tests are requested for you today.  We'll let you know about the results.  Please come back for a follow-up appointment in 2-3 months.

## 2019-12-05 IMAGING — DX DG CHEST 2V
2 series · 2 of 2 positions shown · non-contrast
Comparison: Chest x-ray of May 07, 2016

CLINICAL DATA: Persistent cough. History of double mastectomy for
malignancy in 0386 followed by chemo radiation. Nonsmoker.

EXAM:
CHEST - 2 VIEW

[dg chest 2 view (1 of 2)]
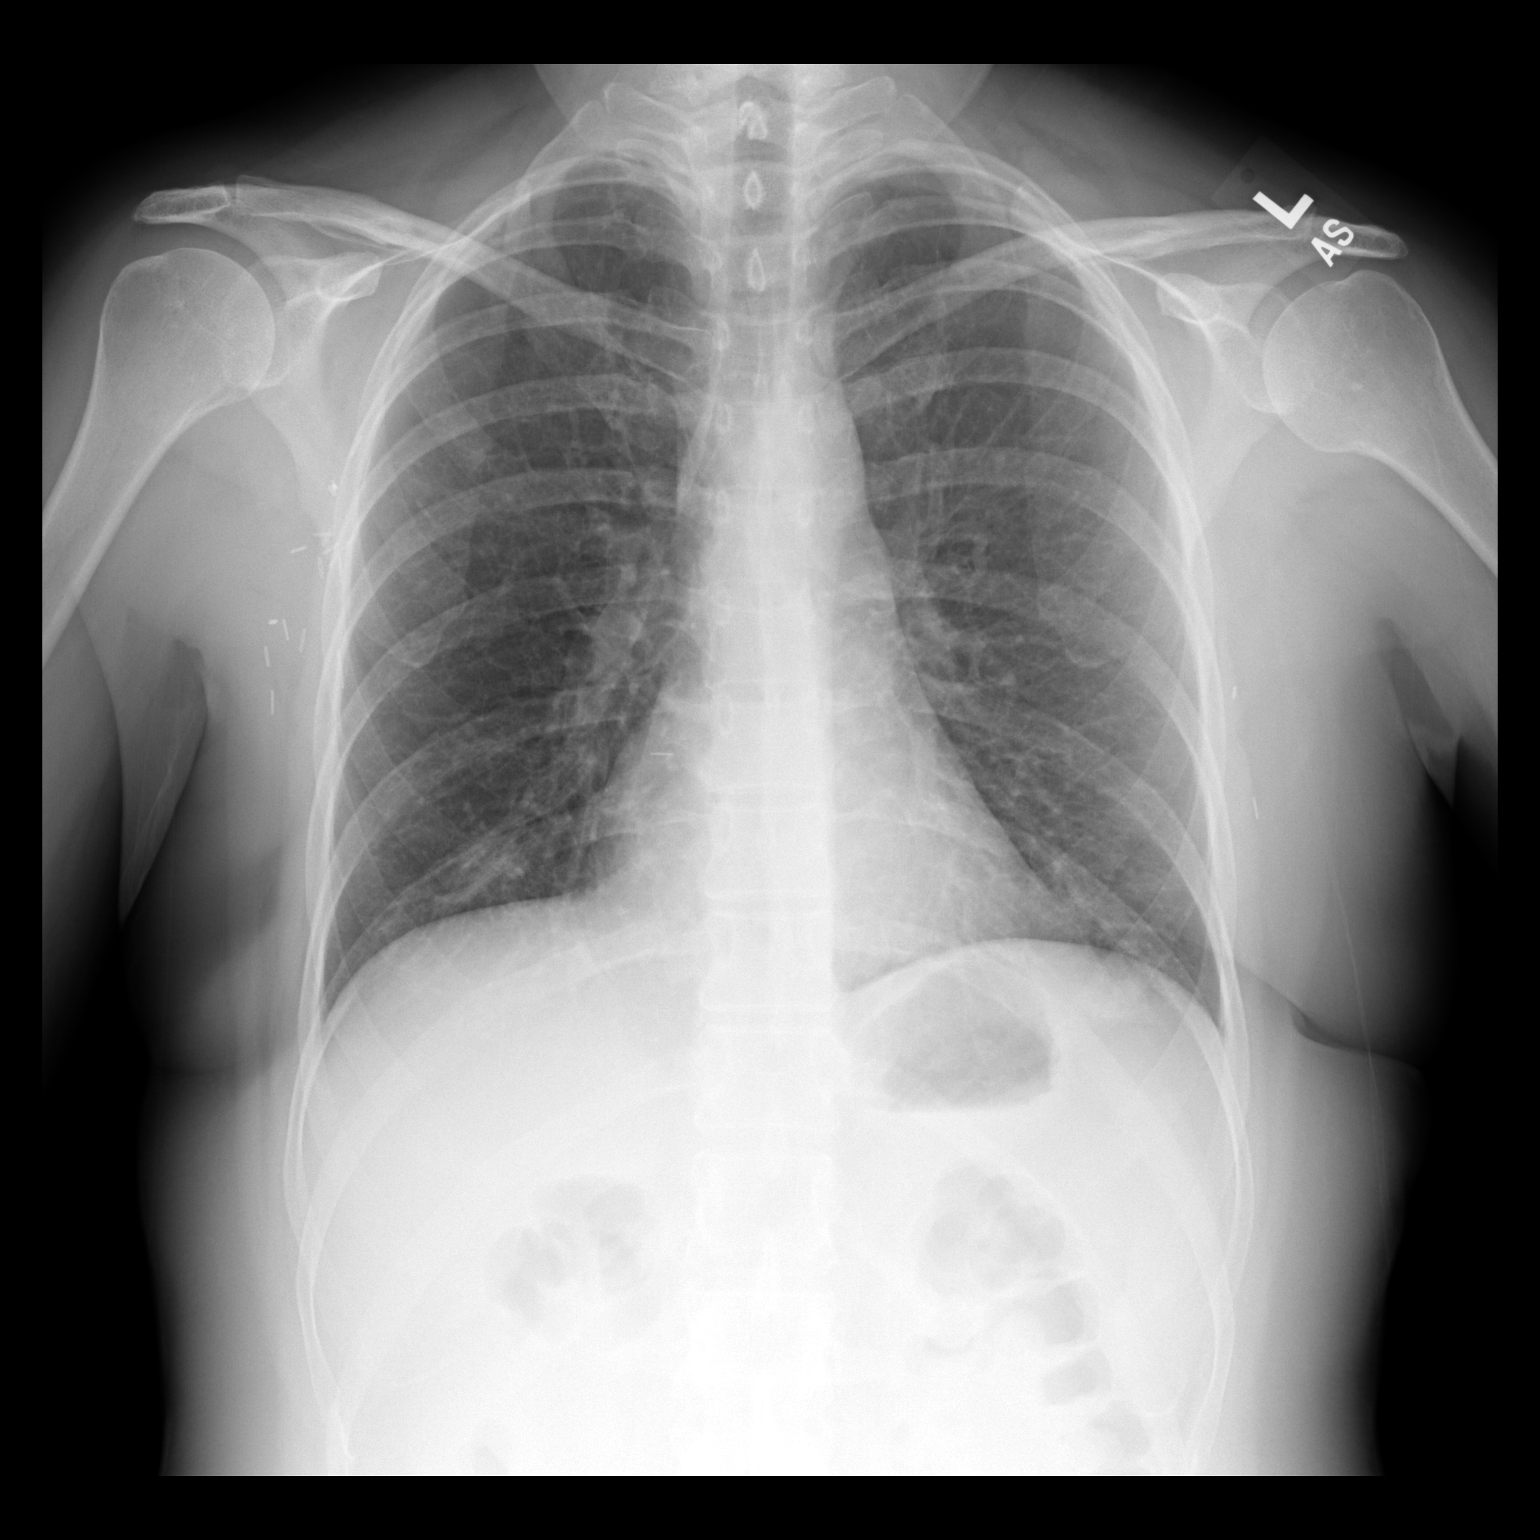

[dg chest 2 view (2 of 2)]
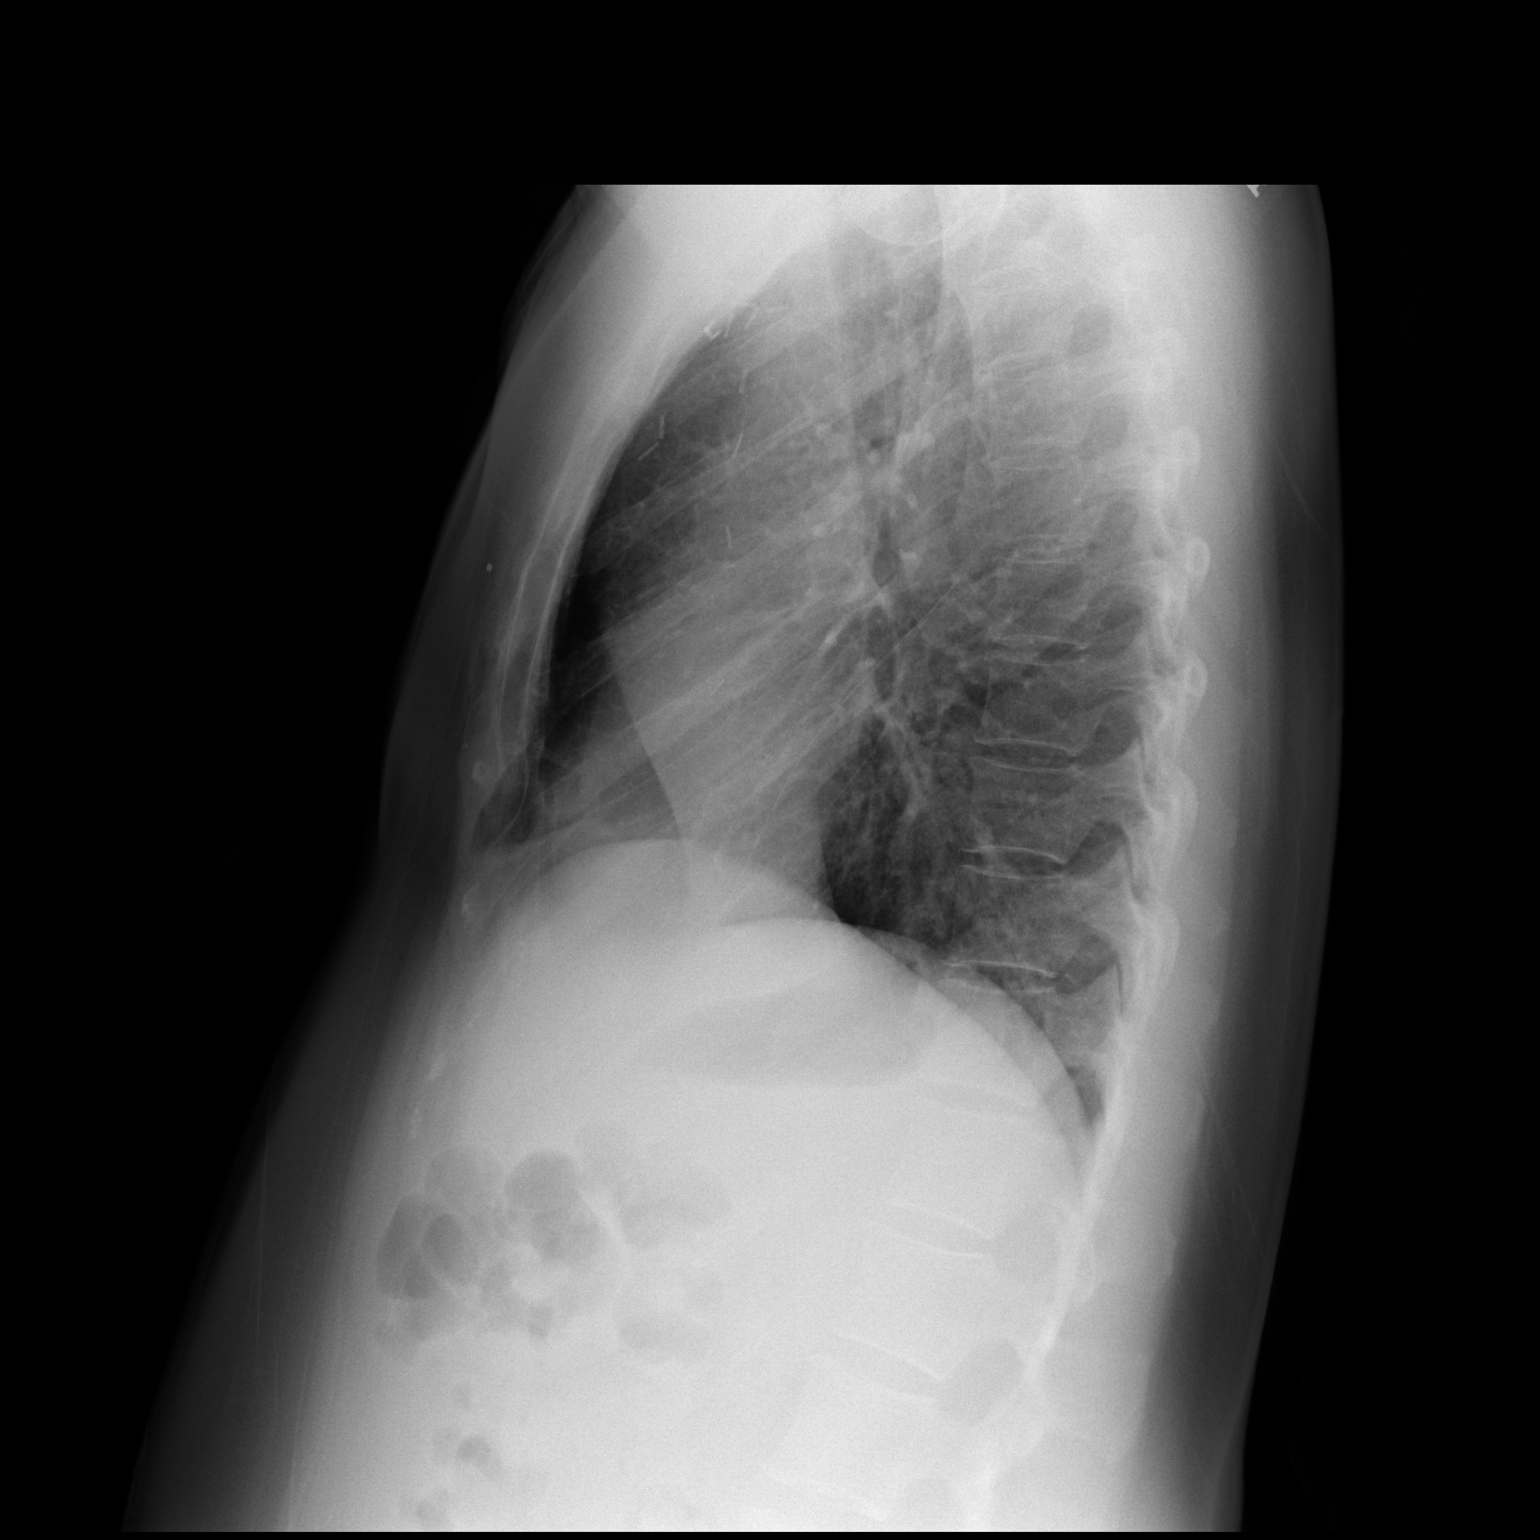

[2 of 2 positions shown; findings below may reference images not displayed]

FINDINGS: The lungs are adequately inflated. There are coarse lung markings at
both bases more conspicuous than in the past. No air bronchograms
are observed. The heart and pulmonary vascularity are normal. The
mediastinum is normal in width. The trachea is midline. The bony
thorax exhibits no acute abnormality. There are surgical clips in
the axillary regions bilaterally.
IMPRESSION: Subsegmental atelectasis or patchy interstitial pneumonia at the
lung bases. No CHF. Followup PA and lateral chest X-ray is
recommended in 3-4 weeks following trial of antibiotic therapy to
ensure resolution and exclude underlying malignancy.

## 2019-12-30 ENCOUNTER — Other Ambulatory Visit: Payer: Self-pay | Admitting: Endocrinology

## 2019-12-30 DIAGNOSIS — E1065 Type 1 diabetes mellitus with hyperglycemia: Secondary | ICD-10-CM

## 2020-01-11 IMAGING — US US BIOPSY LYMPH NODE
1 series · 13 of 25 positions shown · non-contrast
Comparison: CT of the chest on 12/05/2017

CLINICAL DATA: History of breast carcinoma with lymphadenopathy in
the chest and left supraclavicular region.

EXAM:
ULTRASOUND GUIDED CORE BIOPSY OF LEFT SUPRACLAVICULAR LYMPH NODE

[Series 1: us biopsy lymph node · 13 of 29 slices shown]
[im 1/29]
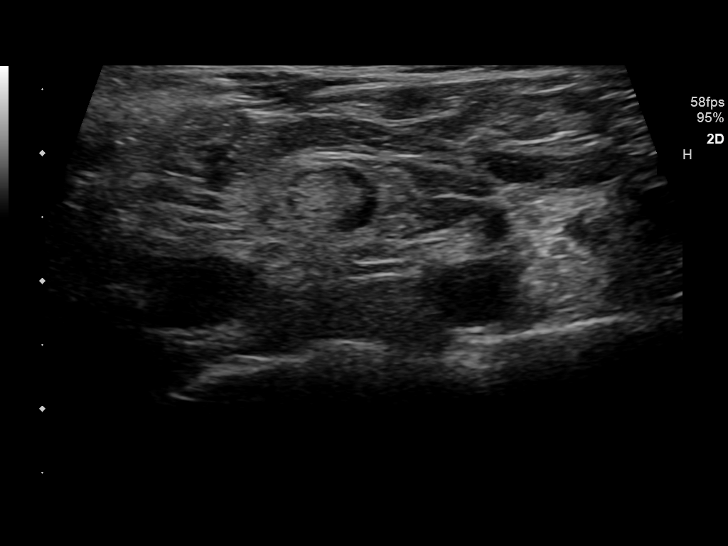
[im 3/29]
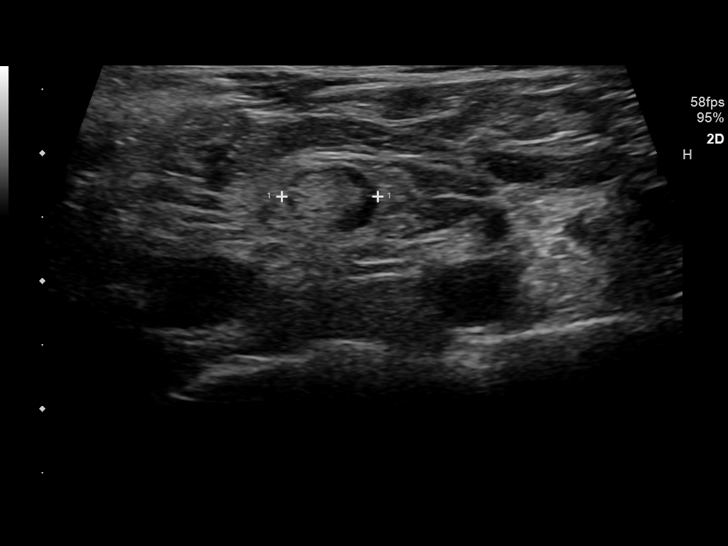
[im 5/29]
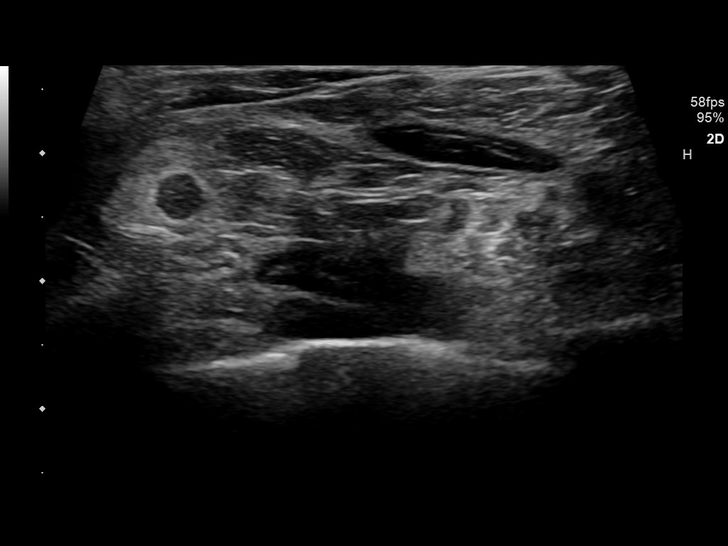
[im 8/29]
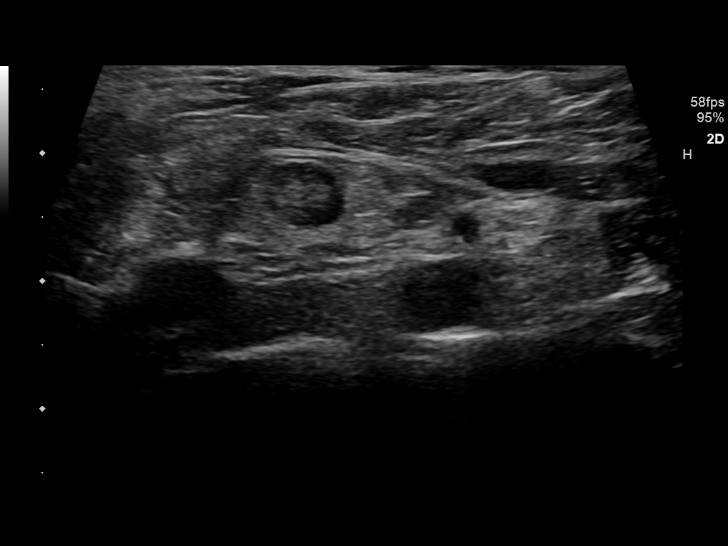
[im 10/29]
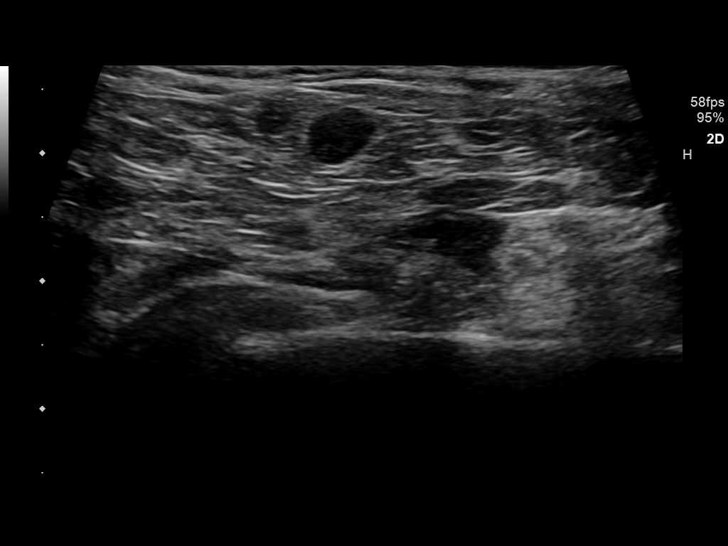
[im 12/29]
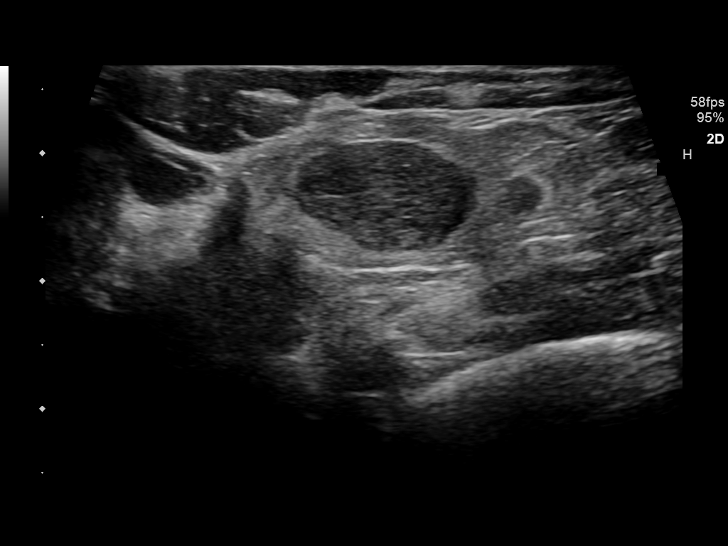
[im 15/29]
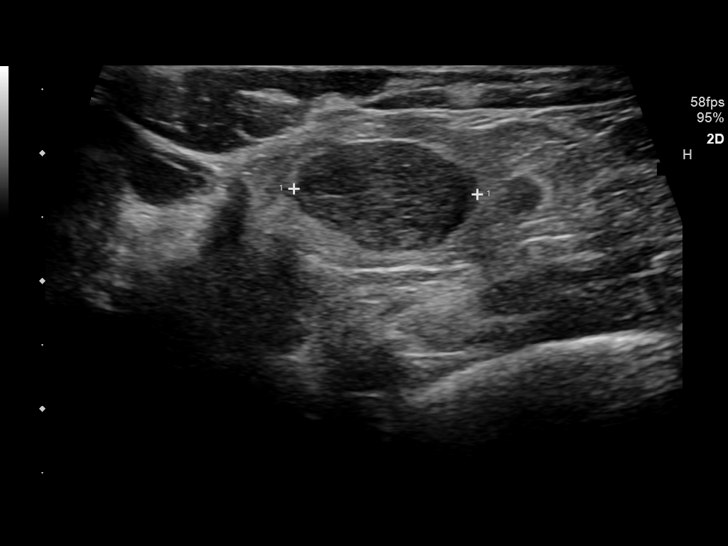
[im 17/29]
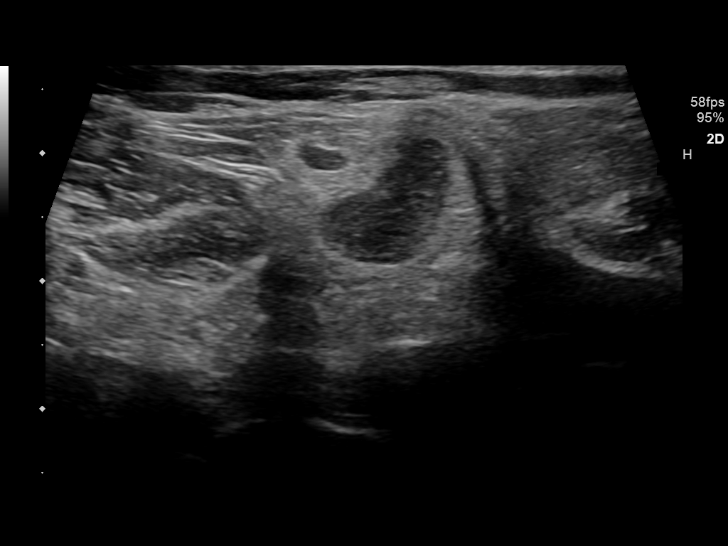
[im 19/29]
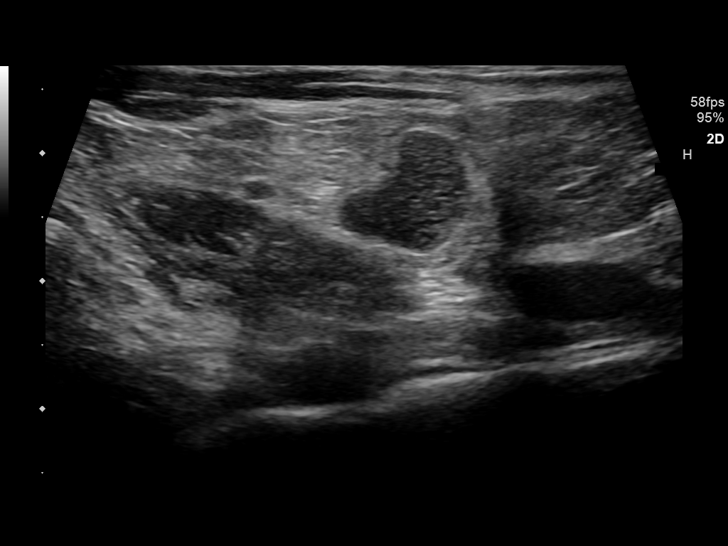
[im 22/29]
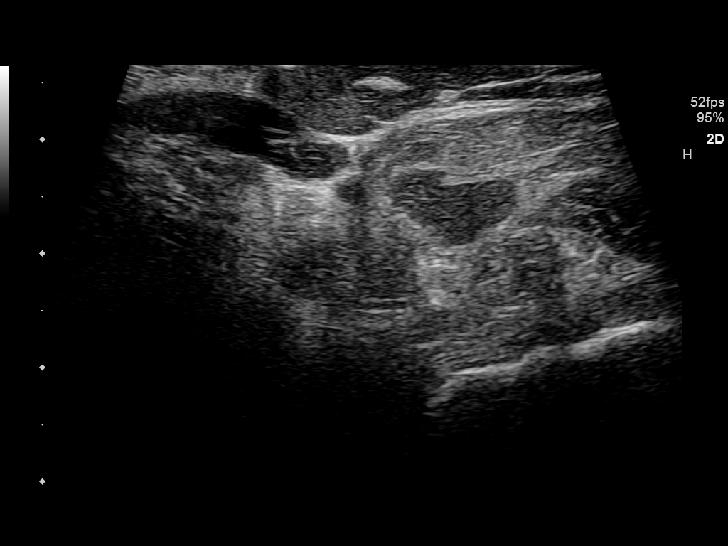
[im 24/29]
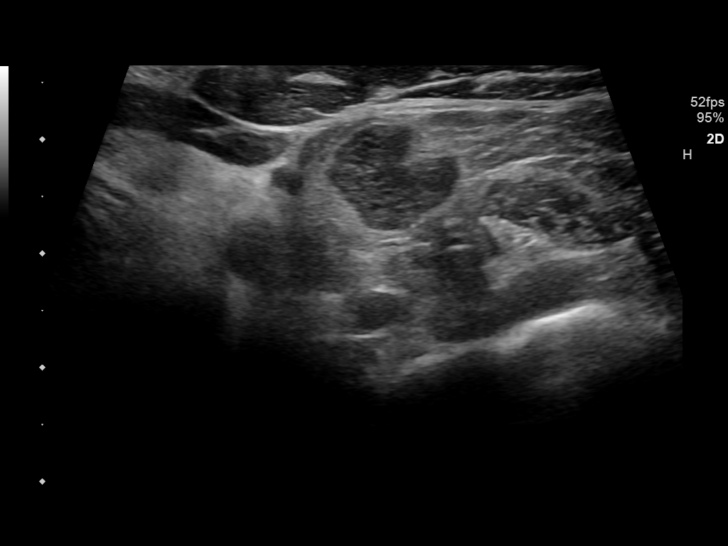
[im 26/29]
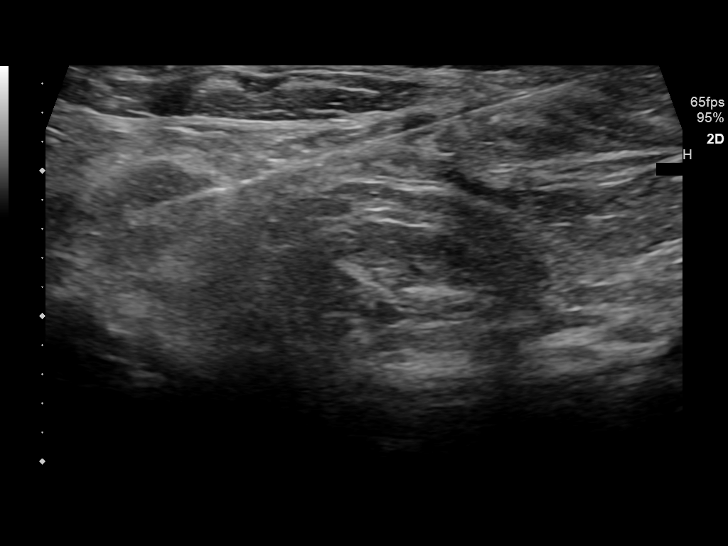
[im 29/29]
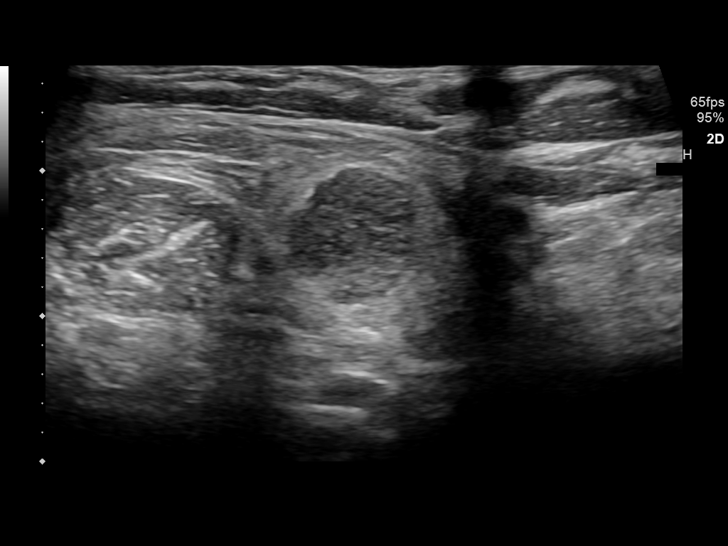

[13 of 25 positions shown; findings below may reference images not displayed]

MEDICATIONS:
2.0 mg IV Versed; 100 mcg IV Fentanyl

Total Moderate Sedation Time: 14 minutes

The patient's level of consciousness and physiologic status were
continuously monitored during the procedure by Radiology nursing.

PROCEDURE:
The procedure, risks, benefits, and alternatives were explained to
the patient. Questions regarding the procedure were encouraged and
answered. The patient understands and consents to the procedure. A
time out was performed prior to initiating the procedure.

The left neck was prepped with chlorhexidine in a sterile fashion,
and a sterile drape was applied covering the operative field. A
sterile gown and sterile gloves were used for the procedure. Local
anesthesia was provided with 1% Lidocaine.

Ultrasound was utilized to localize a left supraclavicular lymph
node. Under direct ultrasound guidance, four 18 gauge core biopsy
samples were obtained and submitted in formalin. Additional
ultrasound was performed.

COMPLICATIONS:
None.
FINDINGS: Lymph node in the left supraclavicular region measuring roughly
x 1.4 cm corresponds to the lymph node present by CT. Solid tissue
samples were obtained.
IMPRESSION: Ultrasound-guided core biopsy performed of left supraclavicular
lymph node.

## 2020-03-01 ENCOUNTER — Ambulatory Visit: Payer: 59 | Admitting: Endocrinology

## 2020-03-01 DIAGNOSIS — Z0289 Encounter for other administrative examinations: Secondary | ICD-10-CM

## 2020-04-06 ENCOUNTER — Other Ambulatory Visit: Payer: Self-pay | Admitting: Endocrinology

## 2020-04-06 DIAGNOSIS — E1065 Type 1 diabetes mellitus with hyperglycemia: Secondary | ICD-10-CM

## 2020-04-11 ENCOUNTER — Encounter: Payer: Self-pay | Admitting: Endocrinology

## 2020-04-11 ENCOUNTER — Ambulatory Visit: Payer: 59 | Admitting: Endocrinology

## 2020-04-11 ENCOUNTER — Other Ambulatory Visit: Payer: Self-pay

## 2020-04-11 VITALS — BP 102/60 | HR 86 | Ht 64.0 in | Wt 181.8 lb

## 2020-04-11 DIAGNOSIS — E1065 Type 1 diabetes mellitus with hyperglycemia: Secondary | ICD-10-CM | POA: Diagnosis not present

## 2020-04-11 LAB — POCT GLYCOSYLATED HEMOGLOBIN (HGB A1C): Hemoglobin A1C: 8.1 % — AB (ref 4.0–5.6)

## 2020-04-11 NOTE — Progress Notes (Signed)
Subjective:    Patient ID: Jasmine Schwartz, female    DOB: 15-Sep-1965, 54 y.o.   MRN: [id number]  HPI Pt returns for f/u of diabetes mellitus:  DM type: 1 Dx'ed: 8182 Complications: none.  Therapy: insulin since dx (now Medtronic 630G).   DKA: never Severe hypoglycemia: never Pancreatitis: never SDOH: she declined continuous glucose monitor, due to cost.   Other: she has been on pump rx since 2004; she has finished rx for her breast cancer, for now.  She has been intermittently on steroids for SLE.   Interval history:  She takes these settings:  basal rate of 1.35 units/HR, 12:30PM-6AM, and 1.2 units/HR, at other times mealtime bolus of 1 unit/approx 8 grams carbohydrate.   correction bolus (which some people call "sensitivity," or "insulin sensitivity ratio," or just "isr") of 1 unit for each 40 by which your glucose exceeds 100).  TDD is 45 units (72% basal) No recent steroids.  Meter is downloaded today, and the printout is scanned into the record.  cbg varies from 50-380.  It is in general higher as the day goes on, but there is not much trend throughout the day.  She seldom has hypoglycemia, and these episodes are mild.  She is on a new diet (low fat and low carb).  Past Medical History:  Diagnosis Date  . Anxiety   . Anxiety and depression 07/06/2011  . Breast cancer ([place]) 01/01/13   /metastatic 1/1 lymph nodes, chemo, radiation, surgery  . Depression   . Diabetes mellitus    Type I  . Dyslipidemia   . Hypothyroidism   . Lower extremity edema   . Lymphadenopathy, supraclavicular   . PONV (postoperative nausea and vomiting)   . Type I diabetes mellitus (HCC)    uses insulin pump  . Venous insufficiency     Past Surgical History:  Procedure Laterality Date  . BREAST BIOPSY Right 01/01/13   Invasive mammary ca,metastatic in 1/1 lymph node  . BREAST BIOPSY Left 01/09/13   Fibroadenoma,microcalcifications  . CESAREAN SECTION  1989  . I & D EXTREMITY Right 05/07/2018    Procedure: BURSECTOMY RIGHT ELBOW;  Surgeon: Newt Minion, MD;  Location: [facility];  Service: Orthopedics;  Laterality: Right;  . LUMBAR DISC SURGERY  2009   Dr Maxie Better  . MASTECTOMY COMPLETE / SIMPLE Left 06/09/2013  . MASTECTOMY MODIFIED RADICAL Right 06/09/2013  . MASTECTOMY MODIFIED RADICAL Right 06/09/2013   Procedure: RIGHT MASTECTOMY MODIFIED RADICAL;  Surgeon: Adin Hector, MD;  Location: [facility];  Service: General;  Laterality: Right;  . PORTACATH PLACEMENT Left 01/2013  . SIMPLE MASTECTOMY WITH AXILLARY SENTINEL NODE BIOPSY Left 06/09/2013   Procedure: LEFT SIMPLE MASTECTOMY;  Surgeon: Adin Hector, MD;  Location: [facility];  Service: General;  Laterality: Left;  . TUBAL LIGATION  1991    Social History   Socioeconomic History  . Marital status: Married    Spouse name: Not on file  . Number of children: 2  . Years of education: Not on file  . Highest education level: Not on file  Occupational History    Employer: [employer]   Tobacco Use  . Smoking status: Never Smoker  . Smokeless tobacco: Never Used  Vaping Use  . Vaping Use: Never used  Substance and Sexual Activity  . Alcohol use: No  . Drug use: No  . Sexual activity: Yes  Other Topics Concern  . Not on file  Social History Narrative  . Not on  file   Social Determinants of Health   Financial Resource Strain:   . Difficulty of Paying Living Expenses: Not on file  Food Insecurity:   . Worried About Charity fundraiser in the Last Year: Not on file  . Ran Out of Food in the Last Year: Not on file  Transportation Needs:   . Lack of Transportation (Medical): Not on file  . Lack of Transportation (Non-Medical): Not on file  Physical Activity:   . Days of Exercise per Week: Not on file  . Minutes of Exercise per Session: Not on file  Stress:   . Feeling of Stress : Not on file  Social Connections:   . Frequency of Communication with Friends and Family: Not on file  . Frequency of Social Gatherings with  Friends and Family: Not on file  . Attends Religious Services: Not on file  . Active Member of Clubs or Organizations: Not on file  . Attends Archivist Meetings: Not on file  . Marital Status: Not on file  Intimate Partner Violence:   . Fear of Current or Ex-Partner: Not on file  . Emotionally Abused: Not on file  . Physically Abused: Not on file  . Sexually Abused: Not on file    Current Outpatient Medications on File Prior to Visit  Medication Sig Dispense Refill  . Ascorbic Acid (VITAMIN C) 1000 MG tablet Take 2,000 mg by mouth 2 (two) times daily.    Marland Kitchen glucose blood (CONTOUR NEXT TEST) test strip TEST BLOOD SUGAR 5 TIMES A DAY 100 strip 11  . hydroxychloroquine (PLAQUENIL) 200 MG tablet Take 200 mg by mouth 2 (two) times daily.    Marland Kitchen ibuprofen (ADVIL,MOTRIN) 200 MG tablet Take 400 mg by mouth 2 (two) times daily as needed for headache or moderate pain.    Marland Kitchen insulin lispro (HUMALOG) 100 UNIT/ML injection ADMINISTER 60 UNITS VIA INSULIN PUMP EVERY DAY 60 mL 0  . levothyroxine (SYNTHROID) 75 MCG tablet Take 1 tablet (75 mcg total) by mouth daily before breakfast. 90 tablet 2   No current facility-administered medications on file prior to visit.    Allergies  Allergen Reactions  . Doxycycline Rash    Family History  Problem Relation Age of Onset  . Coronary artery disease Mother   . Congestive Heart Failure Maternal Grandmother   . Brain cancer Maternal Grandfather        dx in his early 25s  . Cancer Neg Hx   . Diabetes Neg Hx     BP 102/60   Pulse 86   Ht 5\' 4"  (1.626 m)   Wt 181 lb 12.8 oz (82.5 kg)   SpO2 99%   BMI 31.21 kg/m    Review of Systems Denies LOC    Objective:   Physical Exam VITAL SIGNS:  See vs page GENERAL: no distress Pulses: dorsalis pedis intact bilat.   MSK: no deformity of the feet CV: no leg edema Skin:  no ulcer on the feet.  normal color and temp on the feet. Neuro: sensation is intact to touch on the feet  Lab Results    Component Value Date   HGBA1C 8.1 (A) 04/11/2020       Assessment & Plan:  Type 1 DM: uncontrolled Hypoglycemia, due to insulin: this limits aggressiveness of glycemic control.  Patient Instructions  Please continue these pump settings:  basal rate of 1.1 units/HR mealtime bolus of 6-9 units per meal.  correction bolus (which some people call "sensitivity,"  or "insulin sensitivity ratio," or just "isr") of 1 unit for each 40 by which your glucose exceeds 100).   Blood tests are requested for you today.  We'll let you know about the results.  Please come back for a follow-up appointment in 2 months.

## 2020-04-11 NOTE — Patient Instructions (Addendum)
Please continue these pump settings:  basal rate of 1.1 units/HR mealtime bolus of 6-9 units per meal.  correction bolus (which some people call "sensitivity," or "insulin sensitivity ratio," or just "isr") of 1 unit for each 40 by which your glucose exceeds 100).   Blood tests are requested for you today.  We'll let you know about the results.  Please come back for a follow-up appointment in 2 months.

## 2020-04-20 ENCOUNTER — Other Ambulatory Visit: Payer: Self-pay | Admitting: Endocrinology

## 2020-04-20 DIAGNOSIS — E1065 Type 1 diabetes mellitus with hyperglycemia: Secondary | ICD-10-CM

## 2020-04-30 ENCOUNTER — Other Ambulatory Visit: Payer: Self-pay | Admitting: Endocrinology

## 2020-04-30 DIAGNOSIS — E1065 Type 1 diabetes mellitus with hyperglycemia: Secondary | ICD-10-CM

## 2020-06-16 ENCOUNTER — Other Ambulatory Visit: Payer: Self-pay

## 2020-06-16 ENCOUNTER — Ambulatory Visit: Payer: 59 | Admitting: Endocrinology

## 2020-06-16 ENCOUNTER — Encounter: Payer: Self-pay | Admitting: Endocrinology

## 2020-06-16 VITALS — BP 120/76 | HR 80 | Ht 64.0 in | Wt 176.0 lb

## 2020-06-16 DIAGNOSIS — N912 Amenorrhea, unspecified: Secondary | ICD-10-CM | POA: Insufficient documentation

## 2020-06-16 DIAGNOSIS — E1065 Type 1 diabetes mellitus with hyperglycemia: Secondary | ICD-10-CM

## 2020-06-16 LAB — POCT GLYCOSYLATED HEMOGLOBIN (HGB A1C): Hemoglobin A1C: 7.4 % — AB (ref 4.0–5.6)

## 2020-06-16 NOTE — Progress Notes (Signed)
Subjective:    Patient ID: Jasmine Schwartz, female    DOB: 1966/06/15, 54 y.o.   MRN: [id number]  HPI Pt returns for f/u of diabetes mellitus:  DM type: 1 Dx'ed: 7494 Complications: none.  Therapy: insulin since dx (now Medtronic 630G).   DKA: never Severe hypoglycemia: never Pancreatitis: never SDOH: she declined continuous glucose monitor, due to cost.   Other: she has been on pump rx since 2004; she has finished rx for her breast cancer, for now.  She has been intermittently on steroids for SLE.   Interval history:  She takes these settings:  basal rate of 1.25 units/HR mealtime bolus of 6-9 units per meal.  correction bolus (which some people call "sensitivity," or "insulin sensitivity ratio," or just "isr") of 1 unit for each 40 by which your glucose exceeds 100).   She seldom takes mealtime boluses, due to her new diet. TDD is 33 units (91% basal) Meter is downloaded today, and the printout is scanned into the record.  cbg varies from [place].  It is in general higher as the day goes on, but there is not much trend throughout the day.  She has hypoglycemia approx once per week, and these episodes are mild.   Past Medical History:  Diagnosis Date  . Anxiety   . Anxiety and depression 07/06/2011  . Breast cancer ([place]) 01/01/13   /metastatic 1/1 lymph nodes, chemo, radiation, surgery  . Depression   . Diabetes mellitus    Type I  . Dyslipidemia   . Hypothyroidism   . Lower extremity edema   . Lymphadenopathy, supraclavicular   . PONV (postoperative nausea and vomiting)   . Type I diabetes mellitus (HCC)    uses insulin pump  . Venous insufficiency     Past Surgical History:  Procedure Laterality Date  . BREAST BIOPSY Right 01/01/13   Invasive mammary ca,metastatic in 1/1 lymph node  . BREAST BIOPSY Left 01/09/13   Fibroadenoma,microcalcifications  . CESAREAN SECTION  1989  . I & D EXTREMITY Right 05/07/2018   Procedure: BURSECTOMY RIGHT ELBOW;  Surgeon: Newt Minion,  MD;  Location: [facility];  Service: Orthopedics;  Laterality: Right;  . LUMBAR DISC SURGERY  2009   Dr Maxie Better  . MASTECTOMY COMPLETE / SIMPLE Left 06/09/2013  . MASTECTOMY MODIFIED RADICAL Right 06/09/2013  . MASTECTOMY MODIFIED RADICAL Right 06/09/2013   Procedure: RIGHT MASTECTOMY MODIFIED RADICAL;  Surgeon: Adin Hector, MD;  Location: [facility];  Service: General;  Laterality: Right;  . PORTACATH PLACEMENT Left 01/2013  . SIMPLE MASTECTOMY WITH AXILLARY SENTINEL NODE BIOPSY Left 06/09/2013   Procedure: LEFT SIMPLE MASTECTOMY;  Surgeon: Adin Hector, MD;  Location: [facility];  Service: General;  Laterality: Left;  . TUBAL LIGATION  1991    Social History   Socioeconomic History  . Marital status: Married    Spouse name: Not on file  . Number of children: 2  . Years of education: Not on file  . Highest education level: Not on file  Occupational History    Employer: [employer]   Tobacco Use  . Smoking status: Never Smoker  . Smokeless tobacco: Never Used  Vaping Use  . Vaping Use: Never used  Substance and Sexual Activity  . Alcohol use: No  . Drug use: No  . Sexual activity: Yes  Other Topics Concern  . Not on file  Social History Narrative  . Not on file   Social Determinants of Health   Financial  Resource Strain:   . Difficulty of Paying Living Expenses: Not on file  Food Insecurity:   . Worried About Charity fundraiser in the Last Year: Not on file  . Ran Out of Food in the Last Year: Not on file  Transportation Needs:   . Lack of Transportation (Medical): Not on file  . Lack of Transportation (Non-Medical): Not on file  Physical Activity:   . Days of Exercise per Week: Not on file  . Minutes of Exercise per Session: Not on file  Stress:   . Feeling of Stress : Not on file  Social Connections:   . Frequency of Communication with Friends and Family: Not on file  . Frequency of Social Gatherings with Friends and Family: Not on file  . Attends Religious  Services: Not on file  . Active Member of Clubs or Organizations: Not on file  . Attends Archivist Meetings: Not on file  . Marital Status: Not on file  Intimate Partner Violence:   . Fear of Current or Ex-Partner: Not on file  . Emotionally Abused: Not on file  . Physically Abused: Not on file  . Sexually Abused: Not on file    Current Outpatient Medications on File Prior to Visit  Medication Sig Dispense Refill  . Ascorbic Acid (VITAMIN C) 1000 MG tablet Take 2,000 mg by mouth 2 (two) times daily.    Marland Kitchen glucose blood (CONTOUR NEXT TEST) test strip TEST BLOOD SUGAR 5 TIMES A DAY 100 strip 11  . hydroxychloroquine (PLAQUENIL) 200 MG tablet Take 200 mg by mouth 2 (two) times daily.    Marland Kitchen ibuprofen (ADVIL,MOTRIN) 200 MG tablet Take 400 mg by mouth 2 (two) times daily as needed for headache or moderate pain.    Marland Kitchen insulin lispro (HUMALOG) 100 UNIT/ML injection ADMINISTER 60 UNITS VIA INSULIN PUMP EVERY DAY 60 mL 0  . levothyroxine (SYNTHROID) 75 MCG tablet Take 1 tablet (75 mcg total) by mouth daily before breakfast. 90 tablet 2  . citalopram (CELEXA) 10 MG tablet Take 10 mg by mouth daily.    Marland Kitchen glucose blood test strip Contour Next Test Strips  USE TO TEST BLOOD SUGAR FIVE TIMES DAILY    . hydrOXYzine (ATARAX/VISTARIL) 10 MG tablet hydroxyzine HCl 10 mg tablet  TK 1 TO 2 TS PO Q 6 H PRA     No current facility-administered medications on file prior to visit.    Allergies  Allergen Reactions  . Doxycycline Rash    Family History  Problem Relation Age of Onset  . Coronary artery disease Mother   . Congestive Heart Failure Maternal Grandmother   . Brain cancer Maternal Grandfather        dx in his early 48s  . Cancer Neg Hx   . Diabetes Neg Hx     BP 120/76   Pulse 80   Ht 5\' 4"  (1.626 m)   Wt 176 lb (79.8 kg)   SpO2 94%   BMI 30.21 kg/m    Review of Systems She has lost a few more lbs.    Objective:   Physical Exam VITAL SIGNS:  See vs page GENERAL: no  distress Pulses: dorsalis pedis intact bilat.   MSK: no deformity of the feet CV: no leg edema Skin:  no ulcer on the feet.  normal color and temp on the feet. Neuro: sensation is intact to touch on the feet    Lab Results  Component Value Date   HGBA1C 7.4 (A)  06/16/2020       Assessment & Plan:  Type 1 DM Hypoglycemia, due to insulin: this limits aggressiveness of glycemic control   Patient Instructions  Please continue these pump settings:  basal rate of 1 unit/HR  mealtime bolus of 0-4 units per meal.  correction bolus (which some people call "sensitivity," or "insulin sensitivity ratio," or just "isr") of 1 unit for each 40 by which your glucose exceeds 100).   Blood tests are requested for you today.  We'll let you know about the results.  Please come back for a follow-up appointment in 3 months.

## 2020-06-16 NOTE — Patient Instructions (Addendum)
Please continue these pump settings:  basal rate of 1 unit/HR  mealtime bolus of 0-4 units per meal.  correction bolus (which some people call "sensitivity," or "insulin sensitivity ratio," or just "isr") of 1 unit for each 40 by which your glucose exceeds 100).   Blood tests are requested for you today.  We'll let you know about the results.  Please come back for a follow-up appointment in 3 months.

## 2020-06-24 ENCOUNTER — Other Ambulatory Visit: Payer: Self-pay | Admitting: Endocrinology

## 2020-07-28 ENCOUNTER — Other Ambulatory Visit: Payer: Self-pay | Admitting: Endocrinology

## 2020-07-28 DIAGNOSIS — E108 Type 1 diabetes mellitus with unspecified complications: Secondary | ICD-10-CM

## 2020-08-28 ENCOUNTER — Other Ambulatory Visit: Payer: Self-pay | Admitting: Endocrinology

## 2020-08-28 DIAGNOSIS — E1065 Type 1 diabetes mellitus with hyperglycemia: Secondary | ICD-10-CM

## 2020-09-20 ENCOUNTER — Ambulatory Visit: Payer: 59 | Admitting: Endocrinology

## 2020-11-15 ENCOUNTER — Encounter: Payer: Self-pay | Admitting: Gastroenterology

## 2020-12-20 ENCOUNTER — Telehealth: Payer: Self-pay | Admitting: Hematology and Oncology

## 2020-12-20 NOTE — Telephone Encounter (Signed)
Scheduled appts per 5/10 sch msg. Pt aware.  

## 2021-01-04 ENCOUNTER — Telehealth: Payer: Self-pay | Admitting: *Deleted

## 2021-01-04 NOTE — Telephone Encounter (Signed)
Good Morning!  This pt has a Colon scheduled with Dr Ardis Hughs 7-1 and she has an Insulin Pump-  We will need insulin pump instructions from Dr Loanne Drilling prior to her PV 6-10  Thanks   Lelan Pons PV

## 2021-01-10 ENCOUNTER — Telehealth: Payer: Self-pay

## 2021-01-10 NOTE — Telephone Encounter (Signed)
Jasmine Schwartz [birth year] [id number]  Dear Dr. Ashby Dawes, MD has scheduled the above patient for a colonoscopy at [facility] on 02-10-2021.  Our records show that she is on insulin therapy via an insulin pump.  Our colonoscopy prep protocol requires that:  the patient must be on a clear liquid diet the entire day prior to the procedure date as well as the morning of the procedure  the patient must be NPO for 2 hours prior to the procedure   the patient must consume a PEG 3350 solution to prepare for the procedure.  Please advise [place] of any adjustments that need to be made to the patient's insulin pump therapy prior to the above procedure date.    Please route or fax back this completed form to me at [phone] .  If you have any question, please call me at [phone].  Thank you for your help with this matter.  Sincerely,    Elmyra Ricks, CMA

## 2021-01-10 NOTE — Telephone Encounter (Signed)
Per Ranee Gosselin CMA-  See te  Patient advised to follow Dr Cordelia Pen recommendation of reducing basal to 0.7 units/hr the day before procedure and until patient is allowed to eat after procedure.  Patient agreed to plan and verbalized understanding.  No further questions.  Patient also advised to contact Dr Cordelia Pen office to schedule a follow up appointment

## 2021-01-10 NOTE — Telephone Encounter (Signed)
Sent message to Dr Loanne Drilling.  Will wait for response.

## 2021-01-10 NOTE — Telephone Encounter (Signed)
Patient advised to follow Dr Cordelia Pen recommendation of reducing basal to 0.7 units/hr the day before procedure and until patient is allowed to eat after procedure.  Patient agreed to plan and verbalized understanding.  No further questions.  Patient also advised to contact Dr Cordelia Pen office to schedule a follow up appointment.

## 2021-01-19 ENCOUNTER — Other Ambulatory Visit: Payer: Self-pay | Admitting: *Deleted

## 2021-01-19 DIAGNOSIS — Z171 Estrogen receptor negative status [ER-]: Secondary | ICD-10-CM

## 2021-01-19 DIAGNOSIS — C50411 Malignant neoplasm of upper-outer quadrant of right female breast: Secondary | ICD-10-CM

## 2021-01-19 NOTE — Progress Notes (Signed)
Patient Care Team: Greig Right, MD as PCP - General (Family Medicine) Sid Falcon., MD as Referring Physician (Obstetrics and Gynecology)  DIAGNOSIS:    ICD-10-CM   1. Malignant neoplasm of upper-outer quadrant of right breast in female, estrogen receptor negative ([place])  C50.411    Z17.1       SUMMARY OF ONCOLOGIC HISTORY: Oncology History  Malignant neoplasm of upper-outer quadrant of right breast in female, estrogen receptor negative ([place])  01/01/2013 Initial Diagnosis   Mammogram showed diffuse skin thickening and enlarged right axillary lymph nodes with right breast density ultrasound showed 4.5 cm mass right axillary 3.1 cm biopsy showed IMC grade 2-3 lymph node also positive ER/PR neg HER-2 pos Ki-67 76%, MRI 8 cm mas    01/16/2013 - 05/01/2013 Neo-Adjuvant Chemotherapy   Taxotere carboplatin Herceptin and Perjeta x6 cycles    05/29/2013 Surgery   Bilateral mastectomies, left breasts no evidence of cancer right breast no evidence of cancer 40 lymph nodes negative complete pathologic response     - 02/02/2014 Chemotherapy   Adjuvant Herceptin    07/27/2013 - 09/11/2013 Radiation Therapy   Adjuvant radiation therapy      CHIEF COMPLIANT: Follow-up of right breast cancer  INTERVAL HISTORY: Jasmine Schwartz is a 55 y.o. with above-mentioned history of right breast cancer treated with neoadjuvant chemotherapy, bilateral mastectomies, radiation, and Herceptin maintenance. I last saw her 3 years ago. She presents to the clinic today for follow-up.  She is doing extremely well without any problems or concerns.  She wanted to come back and have a checkup to make sure that there is no concerns.  She had blood work today as well.  ALLERGIES:  is allergic to doxycycline.  MEDICATIONS:  Current Outpatient Medications  Medication Sig Dispense Refill   Ascorbic Acid (VITAMIN C) 1000 MG tablet Take 2,000 mg by mouth 2 (two) times daily.     citalopram (CELEXA) 10 MG tablet Take 10  mg by mouth daily.     glucose blood (CONTOUR NEXT TEST) test strip USE TO TEST BLOOD SUGAR FIVE TIMES DAILY 100 strip 11   glucose blood test strip Contour Next Test Strips  USE TO TEST BLOOD SUGAR FIVE TIMES DAILY     hydroxychloroquine (PLAQUENIL) 200 MG tablet Take 200 mg by mouth 2 (two) times daily.     hydrOXYzine (ATARAX/VISTARIL) 10 MG tablet hydroxyzine HCl 10 mg tablet  TK 1 TO 2 TS PO Q 6 H PRA     ibuprofen (ADVIL,MOTRIN) 200 MG tablet Take 400 mg by mouth 2 (two) times daily as needed for headache or moderate pain.     insulin lispro (HUMALOG) 100 UNIT/ML injection ADMINISTER 60 UNITS VIA INSULIN PUMP EVERY DAY 60 mL 0   levothyroxine (SYNTHROID) 75 MCG tablet TAKE 1 TABLET(75 MCG) BY MOUTH DAILY BEFORE BREAKFAST 90 tablet 2   No current facility-administered medications for this visit.    PHYSICAL EXAMINATION: ECOG PERFORMANCE STATUS: 1 - Symptomatic but completely ambulatory  Vitals:   01/20/21 1010  BP: 122/68  Pulse: 73  Resp: 17  Temp: 97.7 F (36.5 C)  SpO2: 97%   Filed Weights   01/20/21 1010  Weight: 178 lb 11.2 oz (81.1 kg)    BREAST: Bilateral mastectomies.  No palpable lumps or nodules in bilateral chest wall or axilla. (exam performed in the presence of a chaperone)  LABORATORY DATA:  I have reviewed the data as listed CMP Latest Ref Rng & Units 06/12/2019 05/07/2018 12/05/2017  Glucose  70 - 99 mg/dL 157(H) 216(H) 251(H)  BUN 6 - 23 mg/dL 21 22(H) 11  Creatinine 0.40 - 1.20 mg/dL 0.75 0.69 0.76  Sodium 135 - 145 mEq/L 137 134(L) 132(L)  Potassium 3.5 - 5.1 mEq/L 3.8 4.2 4.2  Chloride 96 - 112 mEq/L 101 100 99(L)  CO2 19 - 32 mEq/L 29 23 23   Calcium 8.4 - 10.5 mg/dL 9.3 9.3 8.7(L)  Total Protein 6.5 - 8.1 g/dL - - 7.1  Total Bilirubin 0.3 - 1.2 mg/dL - - 1.0  Alkaline Phos 38 - 126 U/L - - 71  AST 15 - 41 U/L - - 51(H)  ALT 14 - 54 U/L - - 29    Lab Results  Component Value Date   WBC 7.1 01/20/2021   HGB 13.3 01/20/2021   HCT 39.0  01/20/2021   MCV 87.6 01/20/2021   PLT 266 01/20/2021   NEUTROABS 4.9 01/20/2021    ASSESSMENT & PLAN:  Malignant neoplasm of upper-outer quadrant of right breast in female, estrogen receptor negative (HCC) Stage II, HER-2/neu positive, 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  Patient has underwent neoadjuvant treatment, followed by bilateral mastectomies with a complete pathologic response.  She then completed a calendar year of Herceptin.    Breast Cancer Surveillance: 1. Breast exam 07/12/2017: Normal chest wall exam 2. Mammogram: No role of imaging.   Hypothyroidism: being managed with primary care Diabetes: I discussed with her extensively about cutting down her sugar intake.  CT chest on 12/05/2017: Interval development of 1 cm left supraclavicular lymph node, enlarged right paratracheal and aortopulmonary lymph nodes concerning for metastatic disease.  Multifocal airspace disease both lungs concerning for multifocal pneumonia Her mother and father both died of COVID-83 in October 27, 2018.  2018/01/15: Biopsy L Supraclav LN: No malignancy Return to clinic in 1 year for follow-up.  After that she can be seen on an as-needed basis.  No orders of the defined types were placed in this encounter.  The patient has a good understanding of the overall plan. she agrees with it. she will call with any problems that may develop before the next visit here.  Total time spent: 20 mins including face to face time and time spent for planning, charting and coordination of care  Rulon Eisenmenger, MD, MPH 01/20/2021  I, Molly Dorshimer, am acting as scribe for Dr. Nicholas Lose.  I have reviewed the above documentation for accuracy and completeness, and I agree with the above.

## 2021-01-19 NOTE — Assessment & Plan Note (Signed)
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 Patient has underwent neoadjuvant treatment, followed by bilateral mastectomies with a complete pathologic response. She then completed a calendar year of Herceptin.  Breast Cancer Surveillance: 1. Breast exam11/30/2018: Normal chest wall exam 2. Mammogram: No role of imaging.  Hypothyroidism: being managed with primary care,recent TSH was very high and her Synthroid dose was adjusted.  CT chest on 12/05/2017: Interval development of 1 cm left supraclavicular lymph node, enlarged right paratracheal and aortopulmonary lymph nodes concerning for metastatic disease.  Multifocal airspace disease both lungs concerning for multifocal pneumonia  12/27/2017: Biopsy L Supraclav LN: No malignancy

## 2021-01-20 ENCOUNTER — Inpatient Hospital Stay: Payer: 59 | Admitting: Hematology and Oncology

## 2021-01-20 ENCOUNTER — Inpatient Hospital Stay: Payer: 59 | Attending: Hematology and Oncology

## 2021-01-20 ENCOUNTER — Ambulatory Visit (AMBULATORY_SURGERY_CENTER): Payer: 59

## 2021-01-20 ENCOUNTER — Other Ambulatory Visit: Payer: Self-pay

## 2021-01-20 VITALS — Ht 64.0 in | Wt 175.0 lb

## 2021-01-20 DIAGNOSIS — E039 Hypothyroidism, unspecified: Secondary | ICD-10-CM | POA: Diagnosis not present

## 2021-01-20 DIAGNOSIS — E119 Type 2 diabetes mellitus without complications: Secondary | ICD-10-CM | POA: Diagnosis not present

## 2021-01-20 DIAGNOSIS — Z9221 Personal history of antineoplastic chemotherapy: Secondary | ICD-10-CM | POA: Diagnosis not present

## 2021-01-20 DIAGNOSIS — C50411 Malignant neoplasm of upper-outer quadrant of right female breast: Secondary | ICD-10-CM | POA: Insufficient documentation

## 2021-01-20 DIAGNOSIS — Z9013 Acquired absence of bilateral breasts and nipples: Secondary | ICD-10-CM | POA: Insufficient documentation

## 2021-01-20 DIAGNOSIS — Z171 Estrogen receptor negative status [ER-]: Secondary | ICD-10-CM | POA: Diagnosis not present

## 2021-01-20 DIAGNOSIS — Z79899 Other long term (current) drug therapy: Secondary | ICD-10-CM | POA: Insufficient documentation

## 2021-01-20 DIAGNOSIS — Z794 Long term (current) use of insulin: Secondary | ICD-10-CM | POA: Diagnosis not present

## 2021-01-20 DIAGNOSIS — Z923 Personal history of irradiation: Secondary | ICD-10-CM | POA: Insufficient documentation

## 2021-01-20 DIAGNOSIS — Z1211 Encounter for screening for malignant neoplasm of colon: Secondary | ICD-10-CM

## 2021-01-20 LAB — CMP (CANCER CENTER ONLY)
ALT: 15 U/L (ref 0–44)
AST: 17 U/L (ref 15–41)
Albumin: 3.6 g/dL (ref 3.5–5.0)
Alkaline Phosphatase: 79 U/L (ref 38–126)
Anion gap: 8 (ref 5–15)
BUN: 18 mg/dL (ref 6–20)
CO2: 29 mmol/L (ref 22–32)
Calcium: 9.2 mg/dL (ref 8.9–10.3)
Chloride: 100 mmol/L (ref 98–111)
Creatinine: 0.82 mg/dL (ref 0.44–1.00)
GFR, Estimated: >60 mL/min (ref >60)
Glucose, Bld: 187 mg/dL — ABNORMAL HIGH (ref 70–99)
Potassium: 4.2 mmol/L (ref 3.5–5.1)
Sodium: 137 mmol/L (ref 135–145)
Total Bilirubin: 0.5 mg/dL (ref 0.3–1.2)
Total Protein: 7.5 g/dL (ref 6.5–8.1)

## 2021-01-20 LAB — CBC WITH DIFFERENTIAL (CANCER CENTER ONLY)
Abs Immature Granulocytes: 0.01 10*3/uL (ref 0.00–0.07)
Basophils Absolute: 0 10*3/uL (ref 0.0–0.1)
Basophils Relative: 1 %
Eosinophils Absolute: 0.1 10*3/uL (ref 0.0–0.5)
Eosinophils Relative: 1 %
HCT: 39 % (ref 36.0–46.0)
Hemoglobin: 13.3 g/dL (ref 12.0–15.0)
Immature Granulocytes: 0 %
Lymphocytes Relative: 19 %
Lymphs Abs: 1.3 10*3/uL (ref 0.7–4.0)
MCH: 29.9 pg (ref 26.0–34.0)
MCHC: 34.1 g/dL (ref 30.0–36.0)
MCV: 87.6 fL (ref 80.0–100.0)
Monocytes Absolute: 0.7 10*3/uL (ref 0.1–1.0)
Monocytes Relative: 10 %
Neutro Abs: 4.9 10*3/uL (ref 1.7–7.7)
Neutrophils Relative %: 69 %
Platelet Count: 266 10*3/uL (ref 150–400)
RBC: 4.45 MIL/uL (ref 3.87–5.11)
RDW: 12.2 % (ref 11.5–15.5)
WBC Count: 7.1 10*3/uL (ref 4.0–10.5)
nRBC: 0 % (ref 0.0–0.2)

## 2021-01-20 MED ORDER — NA SULFATE-K SULFATE-MG SULF 17.5-3.13-1.6 GM/177ML PO SOLN: 1.0000 | Freq: Once | ORAL | 0 refills | Status: AC

## 2021-01-20 NOTE — Progress Notes (Signed)
TELEPHONE VISIT  No egg or soy allergy known to patient  No issues with past sedation with any surgeries or procedures Patient denies ever being told they had issues or difficulty with intubation  No FH of Malignant Hyperthermia No diet pills per patient No home 02 use per patient  No blood thinners per patient  Pt denies issues with constipation  No A fib or A flutter  EMMI video to pt or via [place] 19 guidelines implemented in PV today with Pt and RN  Pt is fully vaccinated  for Covid   Due to the COVID-19 pandemic we are asking patients to follow certain guidelines.  Pt aware of COVID protocols and [facility] guidelines

## 2021-01-25 ENCOUNTER — Telehealth: Payer: Self-pay | Admitting: Hematology and Oncology

## 2021-01-25 NOTE — Telephone Encounter (Signed)
Scheduled per 6/10 los. Called pt and left a msg

## 2021-02-07 ENCOUNTER — Encounter: Payer: Self-pay | Admitting: Gastroenterology

## 2021-02-10 ENCOUNTER — Encounter: Payer: Self-pay | Admitting: Gastroenterology

## 2021-02-10 ENCOUNTER — Ambulatory Visit (AMBULATORY_SURGERY_CENTER): Payer: 59 | Admitting: Gastroenterology

## 2021-02-10 ENCOUNTER — Other Ambulatory Visit: Payer: Self-pay

## 2021-02-10 VITALS — BP 125/69 | HR 68 | Temp 96.6°F | Resp 10 | Ht 64.0 in | Wt 175.0 lb

## 2021-02-10 DIAGNOSIS — Z1211 Encounter for screening for malignant neoplasm of colon: Secondary | ICD-10-CM

## 2021-02-10 MED ORDER — SODIUM CHLORIDE 0.9 % IV SOLN
500.0000 mL | Freq: Once | INTRAVENOUS | Status: DC
Start: 2021-02-10 — End: 2021-02-10

## 2021-02-10 NOTE — Op Note (Signed)
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 The patient's tolerance of                            previous anesthesia was also reviewed. The risks                            and benefits of the procedure and the sedation                            options and risks were discussed with the patient.                            All questions were answered, and informed consent                            was obtained. Prior Anticoagulants: The patient has                            taken no previous anticoagulant or antiplatelet                            agents. ASA Grade Assessment: II - A patient with                            mild systemic disease. After reviewing the risks                            and benefits, the patient was deemed in                            satisfactory condition to undergo the procedure.  After obtaining informed consent, the colonoscope                            was passed under direct vision. Throughout the                            procedure, the patient's blood pressure, pulse, and                            oxygen saturations were monitored continuously. The                            CF [id number] #[id number] was introduced through the anus                            and advanced to  the the cecum, identified by                            appendiceal orifice and ileocecal valve. The                            colonoscopy was performed without difficulty. The                            patient tolerated the procedure well. The quality                            of the bowel preparation was good. The ileocecal                            valve, appendiceal orifice, and rectum were                            photographed. Scope In: 8:24:26 AM Scope Out: 8:38:47 AM Scope Withdrawal Time: 0 hours 11 minutes 25 seconds  Total Procedure Duration: 0 hours 14 minutes 21 seconds  Findings:                 The entire examined colon appeared normal on direct                            and retroflexion views. Complications:            No immediate complications. Estimated blood loss:                            None. Estimated Blood Loss:     Estimated blood loss: none. Impression:               - The entire examined colon is normal on direct and                            retroflexion views.                           - No polyps or cancers. Recommendation:           -  Patient has a contact number available for                            emergencies. The signs and symptoms of potential                            delayed complications were discussed with the                            patient. Return to normal activities tomorrow.                            Written discharge instructions were provided to the                            patient.                           - Resume previous diet.                           - Continue present medications.                           - Repeat colonoscopy in 5 years for screening. Milus Banister, MD 02/10/2021 8:42:02 AM This report has been signed electronically.

## 2021-02-10 NOTE — Progress Notes (Signed)
Medical history reviewed with no changes noted since PV. VS assessed by C.W 

## 2021-02-10 NOTE — Progress Notes (Signed)
Report to PACU, RN, vss, BBS= Clear.  

## 2021-02-10 NOTE — Patient Instructions (Signed)
YOU HAD AN ENDOSCOPIC PROCEDURE TODAY AT [facility]:   Refer to the procedure report that was given to you for any specific questions about what was found during the examination.  If the procedure report does not answer your questions, please call your gastroenterologist to clarify.  If you requested that your care partner not be given the details of your procedure findings, then the procedure report has been included in a sealed envelope for you to review at your convenience later.  YOU SHOULD EXPECT: Some feelings of bloating in the abdomen. Passage of more gas than usual.  Walking can help get rid of the air that was put into your GI tract during the procedure and reduce the bloating. If you had a lower endoscopy (such as a colonoscopy or flexible sigmoidoscopy) you may notice spotting of blood in your stool or on the toilet paper. If you underwent a bowel prep for your procedure, you may not have a normal bowel movement for a few days.  Please Note:  You might notice some irritation and congestion in your nose or some drainage.  This is from the oxygen used during your procedure.  There is no need for concern and it should clear up in a day or so.  SYMPTOMS TO REPORT IMMEDIATELY:  Following lower endoscopy (colonoscopy or flexible sigmoidoscopy):  Excessive amounts of blood in the stool  Significant tenderness or worsening of abdominal pains  Swelling of the abdomen that is new, acute  Fever of 100F or higher    For urgent or emergent issues, a gastroenterologist can be reached at any hour by calling [phone]. Do not use MyChart messaging for urgent concerns.    DIET:  We do recommend a small meal at first, but then you may proceed to your regular diet.  Drink plenty of fluids but you should avoid alcoholic beverages for 24 hours.  ACTIVITY:  You should plan to take it easy for the rest of today and you should NOT DRIVE or use heavy machinery until tomorrow (because  of the sedation medicines used during the test).    FOLLOW UP: Our staff will call the number listed on your records 48-72 hours following your procedure to check on you and address any questions or concerns that you may have regarding the information given to you following your procedure. If we do not reach you, we will leave a message.  We will attempt to reach you two times.  During this call, we will ask if you have developed any symptoms of COVID 19. If you develop any symptoms (ie: fever, flu-like symptoms, shortness of breath, cough etc.) before then, please call [phone].  If you test positive for Covid 19 in the 2 weeks post procedure, please call and report this information to [place].    If any biopsies were taken you will be contacted by phone or by letter within the next 1-3 weeks.  Please call us at [phone] if you have not heard about the biopsies in 3 weeks.    SIGNATURES/CONFIDENTIALITY: You and/or your care partner have signed paperwork which will be entered into your electronic medical record.  These signatures attest to the fact that that the information above on your After Visit Summary has been reviewed and is understood.  Full responsibility of the confidentiality of this discharge information lies with you and/or your care-partner.    Resume medications. Repeat procedure in 5 years.

## 2021-02-15 ENCOUNTER — Telehealth: Payer: Self-pay

## 2021-02-15 NOTE — Telephone Encounter (Signed)
  Follow up Call-  Call back number 02/10/2021  Post procedure Call Back phone  # [phone]  Permission to leave phone message Yes  Some recent data might be hidden     Patient questions:  Do you have a fever, pain , or abdominal swelling? No. Pain Score  0 *  Have you tolerated food without any problems? Yes.    Have you been able to return to your normal activities? Yes.    Do you have any questions about your discharge instructions: Diet   No. Medications  No. Follow up visit  No.  Do you have questions or concerns about your Care? No.  Actions: * If pain score is 4 or above: No action needed, pain <4.  Have you developed a fever since your procedure? no  2.   Have you had an respiratory symptoms (SOB or cough) since your procedure? no  3.   Have you tested positive for COVID 19 since your procedure no  4.   Have you had any family members/close contacts diagnosed with the COVID 19 since your procedure?  no   If yes to any of these questions please route to Joylene John, RN and Joella Prince, RN

## 2021-05-16 ENCOUNTER — Ambulatory Visit: Payer: 59 | Admitting: Endocrinology

## 2021-05-16 ENCOUNTER — Encounter: Payer: Self-pay | Admitting: Endocrinology

## 2021-05-16 ENCOUNTER — Other Ambulatory Visit: Payer: Self-pay

## 2021-05-16 VITALS — BP 120/70 | HR 83 | Ht 64.0 in | Wt 181.4 lb

## 2021-05-16 DIAGNOSIS — E1065 Type 1 diabetes mellitus with hyperglycemia: Secondary | ICD-10-CM

## 2021-05-16 LAB — LIPID PANEL
Cholesterol: 167 mg/dL (ref 0–200)
HDL: 84.9 mg/dL (ref >39.00)
LDL Cholesterol: 68 mg/dL (ref 0–99)
NonHDL: 81.75
Total CHOL/HDL Ratio: 2
Triglycerides: 68 mg/dL (ref 0.0–149.0)
VLDL: 13.6 mg/dL (ref 0.0–40.0)

## 2021-05-16 LAB — BASIC METABOLIC PANEL
BUN: 17 mg/dL (ref 6–23)
CO2: 29 mEq/L (ref 19–32)
Calcium: 9.3 mg/dL (ref 8.4–10.5)
Chloride: 96 mEq/L (ref 96–112)
Creatinine, Ser: 0.79 mg/dL (ref 0.40–1.20)
GFR: 83.8 mL/min (ref >60.00)
Glucose, Bld: 196 mg/dL — ABNORMAL HIGH (ref 70–99)
Potassium: 4.1 mEq/L (ref 3.5–5.1)
Sodium: 134 mEq/L — ABNORMAL LOW (ref 135–145)

## 2021-05-16 LAB — TSH: TSH: 4.52 u[IU]/mL (ref 0.35–5.50)

## 2021-05-16 LAB — POCT GLYCOSYLATED HEMOGLOBIN (HGB A1C): Hemoglobin A1C: 7.6 % — AB (ref 4.0–5.6)

## 2021-05-16 LAB — T4, FREE: Free T4: 1.28 ng/dL (ref 0.60–1.60)

## 2021-05-16 MED ORDER — TIRZEPATIDE 2.5 MG/0.5ML ~~LOC~~ SOAJ: 2.5000 mg | SUBCUTANEOUS | 11 refills | Status: DC

## 2021-05-16 MED ORDER — HUMALOG 100 UNIT/ML ~~LOC~~ SOLN
SUBCUTANEOUS | 3 refills | Status: AC
Start: 2021-05-16 — End: ?

## 2021-05-16 NOTE — Progress Notes (Signed)
Subjective:    Patient ID: Jasmine Schwartz, female    DOB: [birth year], 55 y.o.   MRN: [id number]  HPI Pt returns for f/u of diabetes mellitus:  DM type: 1 Dx'ed: 1157 Complications: none.  Therapy: insulin since dx (now Medtronic 630G).   DKA: never Severe hypoglycemia: never Pancreatitis: never SDOH: she declined continuous glucose monitor, due to cost.   Other: she has been on pump rx since 2004; she has finished rx for her breast cancer, for now.  She has been intermittently on steroids for SLE.   Interval history:  She takes these settings:  basal rate of 1.4 units/HR (except 1.5 units/HR 12MN to 6AM)  mealtime bolus of 0-6 units per meal.  correction bolus (which some people call "sensitivity," or "insulin sensitivity ratio," or just "isr") of 1 unit for each 40 by which your glucose exceeds 100).   She seldom takes mealtime boluses, due to her new diet.  TDD is 48 units (69% basal) Pt says cbg varies from 50-290.  It is in general higher as the day goes on, but there is not much trend throughout the day.  She has hypoglycemia approx once per week, and these episodes are mild.   Past Medical History:  Diagnosis Date   Anxiety    Anxiety and depression 07/06/2011   Breast cancer ([place]) 01/01/13   /metastatic 1/1 lymph nodes, chemo, radiation, surgery   Depression    Diabetes mellitus    Type I   Dyslipidemia    Hypothyroidism    Lower extremity edema    Lymphadenopathy, supraclavicular    PONV (postoperative nausea and vomiting)    Type I diabetes mellitus ([place])    uses insulin pump   Venous insufficiency     Past Surgical History:  Procedure Laterality Date   BREAST BIOPSY Right 01/01/13   Invasive mammary ca,metastatic in 1/1 lymph node   BREAST BIOPSY Left 01/09/13   Fibroadenoma,microcalcifications   CESAREAN SECTION  1989   I & D EXTREMITY Right 05/07/2018   Procedure: BURSECTOMY RIGHT ELBOW;  Surgeon: Newt Minion, MD;  Location: [place];  Service: Orthopedics;   Laterality: Right;   LUMBAR DISC SURGERY  2009   Dr Maxie Better   MASTECTOMY COMPLETE / SIMPLE Left 06/09/2013   MASTECTOMY MODIFIED RADICAL Right 06/09/2013   MASTECTOMY MODIFIED RADICAL Right 06/09/2013   Procedure: RIGHT MASTECTOMY MODIFIED RADICAL;  Surgeon: Adin Hector, MD;  Location: [place];  Service: General;  Laterality: Right;   PORTACATH PLACEMENT Left 01/2013   SIMPLE MASTECTOMY WITH AXILLARY SENTINEL NODE BIOPSY Left 06/09/2013   Procedure: LEFT SIMPLE MASTECTOMY;  Surgeon: Adin Hector, MD;  Location: [place];  Service: General;  Laterality: Left;   TUBAL LIGATION  1991    Social History   Socioeconomic History   Marital status: Married    Spouse name: Not on file   Number of children: 2   Years of education: Not on file   Highest education level: Not on file  Occupational History    Employer: [employer]   Tobacco Use   Smoking status: Never   Smokeless tobacco: Never  Vaping Use   Vaping Use: Never used  Substance and Sexual Activity   Alcohol use: No   Drug use: No   Sexual activity: Yes  Other Topics Concern   Not on file  Social History Narrative   Not on file   Social Determinants of Health   Financial Resource Strain: Not on file  Food Insecurity: Not on file  Transportation Needs: Not on file  Physical Activity: Not on file  Stress: Not on file  Social Connections: Not on file  Intimate Partner Violence: Not on file    Current Outpatient Medications on File Prior to Visit  Medication Sig Dispense Refill   Ascorbic Acid (VITAMIN C) 1000 MG tablet Take 2,000 mg by mouth 2 (two) times daily.     citalopram (CELEXA) 10 MG tablet Take 10 mg by mouth daily.     glucose blood (CONTOUR NEXT TEST) test strip USE TO TEST BLOOD SUGAR FIVE TIMES DAILY 100 strip 11   glucose blood test strip Contour Next Test Strips  USE TO TEST BLOOD SUGAR FIVE TIMES DAILY     hydroxychloroquine (PLAQUENIL) 200 MG tablet Take 200 mg by mouth 2 (two) times daily.      ibuprofen (ADVIL,MOTRIN) 200 MG tablet Take 400 mg by mouth 2 (two) times daily as needed for headache or moderate pain.     levothyroxine (SYNTHROID) 75 MCG tablet TAKE 1 TABLET(75 MCG) BY MOUTH DAILY BEFORE BREAKFAST 90 tablet 2   No current facility-administered medications on file prior to visit.    Allergies  Allergen Reactions   Doxycycline Rash    Family History  Problem Relation Age of Onset   Coronary artery disease Mother    Congestive Heart Failure Maternal Grandmother    Brain cancer Maternal Grandfather        dx in his early 21s   Cancer Neg Hx    Diabetes Neg Hx    Colon cancer Neg Hx    Esophageal cancer Neg Hx    Rectal cancer Neg Hx    Stomach cancer Neg Hx     BP 120/70 (BP Location: Right Arm, Patient Position: Sitting, Cuff Size: Normal)   Pulse 83   Ht 5\' 4"  (1.626 m)   Wt 181 lb 6.4 oz (82.3 kg)   LMP 02/07/2013   SpO2 95%   BMI 31.14 kg/m    Review of Systems     Objective:   Physical Exam Pulses: dorsalis pedis intact bilat.   MSK: no deformity of the feet CV: no leg edema Skin:  no ulcer on the feet.  normal color and temp on the feet.  Neuro: sensation is intact to touch on the feet, but decreased from normal.    A1c=7.6%     Assessment & Plan:  Type 1 DM: uncontrolled.   Hypoglycemia, due to insulin: We discussed.  She wants to try adding Mounjaro, rather than adding insulin.    Patient Instructions  Please continue these pump settings:  basal rate of 1.4 units/HR (except 1.5 units/HR 12MN to 6AM)  mealtime bolus of 0-6 units per meal.  correction bolus (which some people call "sensitivity," or "insulin sensitivity ratio," or just "isr") of 1 unit for each 40 by which your glucose exceeds 100).   I have sent a prescription to your pharmacy, to add "Mounjaro."  We are placing a continuous glucose monitor sensor today.   Please come back for a follow-up appointment in 2 weeks.

## 2021-05-16 NOTE — Patient Instructions (Addendum)
Please continue these pump settings:  basal rate of 1.4 units/HR (except 1.5 units/HR 12MN to 6AM)  mealtime bolus of 0-6 units per meal.  correction bolus (which some people call "sensitivity," or "insulin sensitivity ratio," or just "isr") of 1 unit for each 40 by which your glucose exceeds 100).   I have sent a prescription to your pharmacy, to add "Mounjaro."  We are placing a continuous glucose monitor sensor today.   Please come back for a follow-up appointment in 2 weeks.

## 2021-05-25 ENCOUNTER — Telehealth: Payer: Self-pay | Admitting: Dietician

## 2021-05-25 NOTE — Telephone Encounter (Signed)
Returned patient call. Patient reports having the [facility] put on during an office visit 05/15/2021 but could not get a time to meet with MD until October 27.  She is asking what to do with the [facility] in the mean time.  Discussed that at the end of the 14 days, she can remove the [facility], place in a zip lock or plastic bag and bring it to her appointment with the MD on October 27.  Antonieta Iba, RD, LDN, CDCES

## 2021-05-27 ENCOUNTER — Encounter: Payer: Self-pay | Admitting: Endocrinology

## 2021-06-08 ENCOUNTER — Ambulatory Visit: Payer: 59 | Admitting: Endocrinology

## 2021-06-08 ENCOUNTER — Other Ambulatory Visit: Payer: Self-pay

## 2021-06-08 VITALS — BP 126/70 | HR 85 | Ht 64.0 in | Wt 176.2 lb

## 2021-06-08 DIAGNOSIS — E1065 Type 1 diabetes mellitus with hyperglycemia: Secondary | ICD-10-CM | POA: Diagnosis not present

## 2021-06-08 MED ORDER — FREESTYLE LIBRE 2 SENSOR MISC: 1.0000 | 3 refills | Status: DC

## 2021-06-08 NOTE — Patient Instructions (Addendum)
Please continue these pump settings:  basal rate of 1.25 units/HR (except 0.8 units/HR 12MN to 6AM)  mealtime bolus of 0-6 units per meal.  correction bolus (which some people call "sensitivity," or "insulin sensitivity ratio," or just "isr") of 1 unit for each 40 by which your glucose exceeds 100).   Please continue the same [facility].  Please come back for a follow-up appointment in January.

## 2021-06-08 NOTE — Progress Notes (Signed)
Subjective:    Patient ID: Jasmine Schwartz, female    DOB: 1966/05/07, 55 y.o.   MRN: [id number]  HPI Pt returns for f/u of diabetes mellitus:  DM type: 1 Dx'ed: 7741 Complications: none.  Therapy: insulin since dx (now Medtronic 630G).   DKA: never Severe hypoglycemia: never Pancreatitis: never SDOH: none Other: she has been on pump rx since 2004; she has finished rx for her breast cancer, for now.  She has been intermittently on steroids for SLE.   Interval history:  She takes these settings:  basal rate of 1.25 units/HR (except 1.15 units/HR 12MN to 6AM)  mealtime bolus of 0-6 units per meal.  correction bolus (which some people call "sensitivity," or "insulin sensitivity ratio," or just "isr") of 1 unit for each 40 by which your glucose exceeds 100).   She seldom takes mealtime boluses, due to her new diet.  TDD is 30 units (96% basal) I reviewed continuous glucose monitor data.  It is in general highest at 9-10AM, and lowest at at [place].  there is not much trend throughout the day.  She has hypoglycemia approx once per week, and these episodes are mild.  Despite the insulin reduction, she has frequent hypoglycemia.  She seldom takes boluses.   Past Medical History:  Diagnosis Date   Anxiety    Anxiety and depression 07/06/2011   Breast cancer ([place]) 01/01/13   /metastatic 1/1 lymph nodes, chemo, radiation, surgery   Depression    Diabetes mellitus    Type I   Dyslipidemia    Hypothyroidism    Lower extremity edema    Lymphadenopathy, supraclavicular    PONV (postoperative nausea and vomiting)    Type I diabetes mellitus ([place])    uses insulin pump   Venous insufficiency     Past Surgical History:  Procedure Laterality Date   BREAST BIOPSY Right 01/01/13   Invasive mammary ca,metastatic in 1/1 lymph node   BREAST BIOPSY Left 01/09/13   Fibroadenoma,microcalcifications   CESAREAN SECTION  1989   I & D EXTREMITY Right 05/07/2018   Procedure: BURSECTOMY RIGHT ELBOW;   Surgeon: Newt Minion, MD;  Location: [place];  Service: Orthopedics;  Laterality: Right;   LUMBAR DISC SURGERY  2009   Dr Maxie Better   MASTECTOMY COMPLETE / SIMPLE Left 06/09/2013   MASTECTOMY MODIFIED RADICAL Right 06/09/2013   MASTECTOMY MODIFIED RADICAL Right 06/09/2013   Procedure: RIGHT MASTECTOMY MODIFIED RADICAL;  Surgeon: Adin Hector, MD;  Location: [place];  Service: General;  Laterality: Right;   PORTACATH PLACEMENT Left 01/2013   SIMPLE MASTECTOMY WITH AXILLARY SENTINEL NODE BIOPSY Left 06/09/2013   Procedure: LEFT SIMPLE MASTECTOMY;  Surgeon: Adin Hector, MD;  Location: [place];  Service: General;  Laterality: Left;   TUBAL LIGATION  1991    Social History   Socioeconomic History   Marital status: Married    Spouse name: Not on file   Number of children: 2   Years of education: Not on file   Highest education level: Not on file  Occupational History    Employer: [employer]   Tobacco Use   Smoking status: Never   Smokeless tobacco: Never  Vaping Use   Vaping Use: Never used  Substance and Sexual Activity   Alcohol use: No   Drug use: No   Sexual activity: Yes  Other Topics Concern   Not on file  Social History Narrative   Not on file   Social Determinants of Health  Financial Resource Strain: Not on file  Food Insecurity: Not on file  Transportation Needs: Not on file  Physical Activity: Not on file  Stress: Not on file  Social Connections: Not on file  Intimate Partner Violence: Not on file    Current Outpatient Medications on File Prior to Visit  Medication Sig Dispense Refill   Ascorbic Acid (VITAMIN C) 1000 MG tablet Take 2,000 mg by mouth 2 (two) times daily.     citalopram (CELEXA) 10 MG tablet Take 10 mg by mouth daily.     glucose blood (CONTOUR NEXT TEST) test strip USE TO TEST BLOOD SUGAR FIVE TIMES DAILY 100 strip 11   glucose blood test strip Contour Next Test Strips  USE TO TEST BLOOD SUGAR FIVE TIMES DAILY     hydroxychloroquine  (PLAQUENIL) 200 MG tablet Take 200 mg by mouth 2 (two) times daily.     ibuprofen (ADVIL,MOTRIN) 200 MG tablet Take 400 mg by mouth 2 (two) times daily as needed for headache or moderate pain.     insulin lispro (HUMALOG) 100 UNIT/ML injection ADMINISTER 60 UNITS VIA INSULIN PUMP EVERY DAY 60 mL 3   levothyroxine (SYNTHROID) 75 MCG tablet TAKE 1 TABLET(75 MCG) BY MOUTH DAILY BEFORE BREAKFAST 90 tablet 2   tirzepatide (MOUNJARO) 2.5 MG/0.5ML Pen Inject 2.5 mg into the skin once a week. 2 mL 11   No current facility-administered medications on file prior to visit.    Allergies  Allergen Reactions   Doxycycline Rash    Family History  Problem Relation Age of Onset   Coronary artery disease Mother    Congestive Heart Failure Maternal Grandmother    Brain cancer Maternal Grandfather        dx in his early 53s   Cancer Neg Hx    Diabetes Neg Hx    Colon cancer Neg Hx    Esophageal cancer Neg Hx    Rectal cancer Neg Hx    Stomach cancer Neg Hx     BP 126/70 (BP Location: Right Arm, Patient Position: Sitting, Cuff Size: Normal)   Pulse 85   Ht 5\' 4"  (1.626 m)   Wt 176 lb 3.2 oz (79.9 kg)   LMP 02/07/2013   SpO2 92%   BMI 30.24 kg/m    Review of Systems Nausea is resolved    Objective:   Physical Exam   Lab Results  Component Value Date   HGBA1C 7.6 (A) 05/16/2021      Assessment & Plan:  Type 1 DM: uncontrolled.  She declines to increase Mounjaro now.    Patient Instructions  Please continue these pump settings:  basal rate of 1.25 units/HR (except 0.8 units/HR 12MN to 6AM)  mealtime bolus of 0-6 units per meal.  correction bolus (which some people call "sensitivity," or "insulin sensitivity ratio," or just "isr") of 1 unit for each 40 by which your glucose exceeds 100).   Please continue the same [facility].  Please come back for a follow-up appointment in January.

## 2021-06-13 ENCOUNTER — Other Ambulatory Visit: Payer: Self-pay | Admitting: Endocrinology

## 2021-06-27 ENCOUNTER — Telehealth: Payer: Self-pay | Admitting: Endocrinology

## 2021-06-27 ENCOUNTER — Other Ambulatory Visit: Payer: Self-pay | Admitting: Endocrinology

## 2021-06-27 MED ORDER — TIRZEPATIDE 5 MG/0.5ML ~~LOC~~ SOAJ: 5.0000 mg | SUBCUTANEOUS | 3 refills | Status: DC

## 2021-06-27 NOTE — Telephone Encounter (Signed)
Pt calling asking if she can increase her dosage to 5mg  of Moujaro. Please call her back with advice [phone]

## 2021-06-28 ENCOUNTER — Other Ambulatory Visit (HOSPITAL_COMMUNITY): Payer: Self-pay

## 2021-06-28 ENCOUNTER — Telehealth: Payer: Self-pay | Admitting: Pharmacy Technician

## 2021-06-28 NOTE — Telephone Encounter (Signed)
Patient Advocate Encounter   Received notification from Office that prior authorization for Mounjaro 5mg  is required by his/her insurance [employer].  Per Test Claim: pt can get the medication with an evoucher, for $25   PA submitted on 06/28/21 Key [id number] Status is pending    [facility] will continue to follow:  Patient Advocate Fax:  [phone]

## 2021-07-24 ENCOUNTER — Encounter: Payer: Self-pay | Admitting: Endocrinology

## 2021-07-25 NOTE — Telephone Encounter (Signed)
Patient called to verify that we received her message regarding Mounjaro. Patient states that her RX and the PA for Darcel Bayley has to reflect that she takes the medication for diabetes and not weight loss.  Please contact patient back by call to [phone]

## 2021-07-25 NOTE — Telephone Encounter (Signed)
Patient Advocate Encounter  Received notification from [place] that the request for prior authorization for Darcel Bayley has been denied due to not meeting ins requirements.    The request for coverage for Mounjaro Inj 5mg /0.5, use as directed (6 per 84 days), is denied. This decision is based on health plan criteria for [facility]. This medicine is covered only if: All of the following: (1) You have a diagnosis of type 2 diabetes mellitus confirmed by accepted laboratory testing methodologies per treatment guidelines (for example, A1C greater than or equal to 6.5%, fasting plasma glucose greater than or equal to 126mg /dL and/or 2-hour plasma glucose greater than or equal to 200mg /dL). (2) You have suboptimal response (after a three-month trial), contraindication or intolerance to metformin (generic Glucophage, Glucophage XR).

## 2021-08-01 ENCOUNTER — Other Ambulatory Visit: Payer: Self-pay

## 2021-08-01 DIAGNOSIS — E1065 Type 1 diabetes mellitus with hyperglycemia: Secondary | ICD-10-CM

## 2021-08-02 ENCOUNTER — Other Ambulatory Visit: Payer: Self-pay

## 2021-08-02 DIAGNOSIS — E1065 Type 1 diabetes mellitus with hyperglycemia: Secondary | ICD-10-CM

## 2021-08-02 MED ORDER — TIRZEPATIDE 5 MG/0.5ML ~~LOC~~ SOAJ
5.0000 mg | SUBCUTANEOUS | 3 refills | Status: DC
Start: 2021-08-02 — End: 2022-10-01

## 2021-08-02 NOTE — Telephone Encounter (Signed)
Rx has been updated and sent to pharmacy

## 2021-09-05 ENCOUNTER — Encounter: Payer: Self-pay | Admitting: Endocrinology

## 2021-09-05 ENCOUNTER — Ambulatory Visit: Payer: 59 | Admitting: Endocrinology

## 2021-09-05 ENCOUNTER — Other Ambulatory Visit: Payer: Self-pay

## 2021-09-05 VITALS — BP 132/70 | HR 86 | Ht 64.0 in | Wt 161.2 lb

## 2021-09-05 DIAGNOSIS — E1065 Type 1 diabetes mellitus with hyperglycemia: Secondary | ICD-10-CM | POA: Diagnosis not present

## 2021-09-05 NOTE — Patient Instructions (Addendum)
Please continue these pump settings:  basal rate of 1 unit/HR (except 0.6 units/HR 12MN to 6AM). mealtime bolus of 0-6 units per meal.  correction bolus (which some people call "sensitivity," or "insulin sensitivity ratio," or just "isr") of 1 unit for each 40 by which your glucose exceeds 100).   Please continue the same [facility].   Please ask your insurance how much a new pump (670) would be.   Please come back for a follow-up appointment in 2-3 months.

## 2021-09-05 NOTE — Progress Notes (Signed)
Subjective:    Patient ID: Jasmine Schwartz, female    DOB: 05/26/1966, 56 y.o.   MRN: [id number]  HPI Pt returns for f/u of diabetes mellitus:  DM type: 1 Dx'ed: 3149 Complications: none.  Therapy: insulin since dx (now Medtronic 630G).   DKA: never Severe hypoglycemia: never Pancreatitis: never SDOH: none Other: she has been on pump rx since 2004; she has finished rx for her breast cancer, for now.  She has been intermittently on steroids for SLE.   Interval history:  She takes these settings:  basal rate of 1.25 units/HR (except 1.15 units/HR 12MN to 6AM)  mealtime bolus of 0-6 units per meal.  correction bolus (which some people call "sensitivity," or "insulin sensitivity ratio," or just "isr") of 1 unit for each 40 by which your glucose exceeds 100).   She seldom takes mealtime boluses, due to her new diet.   she stopped continuous glucose monitor, due to cost.  It is in general highest at 9-10AM, and lowest at at 4AM.  there is not much trend throughout the day.   she has daily hypoglycemia.  This happens before lunch, or in the middle of the night.  She seldom takes boluses.   Past Medical History:  Diagnosis Date   Anxiety    Anxiety and depression 07/06/2011   Breast cancer ([place]) 01/01/13   /metastatic 1/1 lymph nodes, chemo, radiation, surgery   Depression    Diabetes mellitus    Type I   Dyslipidemia    Hypothyroidism    Lower extremity edema    Lymphadenopathy, supraclavicular    PONV (postoperative nausea and vomiting)    Type I diabetes mellitus ([place])    uses insulin pump   Venous insufficiency     Past Surgical History:  Procedure Laterality Date   BREAST BIOPSY Right 01/01/13   Invasive mammary ca,metastatic in 1/1 lymph node   BREAST BIOPSY Left 01/09/13   Fibroadenoma,microcalcifications   CESAREAN SECTION  1989   I & D EXTREMITY Right 05/07/2018   Procedure: BURSECTOMY RIGHT ELBOW;  Surgeon: Newt Minion, MD;  Location: [facility];  Service: Orthopedics;   Laterality: Right;   LUMBAR DISC SURGERY  2009   Dr Maxie Better   MASTECTOMY COMPLETE / SIMPLE Left 06/09/2013   MASTECTOMY MODIFIED RADICAL Right 06/09/2013   MASTECTOMY MODIFIED RADICAL Right 06/09/2013   Procedure: RIGHT MASTECTOMY MODIFIED RADICAL;  Surgeon: Adin Hector, MD;  Location: [facility];  Service: General;  Laterality: Right;   PORTACATH PLACEMENT Left 01/2013   SIMPLE MASTECTOMY WITH AXILLARY SENTINEL NODE BIOPSY Left 06/09/2013   Procedure: LEFT SIMPLE MASTECTOMY;  Surgeon: Adin Hector, MD;  Location: [facility];  Service: General;  Laterality: Left;   TUBAL LIGATION  1991    Social History   Socioeconomic History   Marital status: Married    Spouse name: Not on file   Number of children: 2   Years of education: Not on file   Highest education level: Not on file  Occupational History    Employer: [employer]   Tobacco Use   Smoking status: Never   Smokeless tobacco: Never  Vaping Use   Vaping Use: Never used  Substance and Sexual Activity   Alcohol use: No   Drug use: No   Sexual activity: Yes  Other Topics Concern   Not on file  Social History Narrative   Not on file   Social Determinants of Health   Financial Resource Strain: Not on file  Food Insecurity: Not on file  Transportation Needs: Not on file  Physical Activity: Not on file  Stress: Not on file  Social Connections: Not on file  Intimate Partner Violence: Not on file    Current Outpatient Medications on File Prior to Visit  Medication Sig Dispense Refill   Ascorbic Acid (VITAMIN C) 1000 MG tablet Take 2,000 mg by mouth 2 (two) times daily.     citalopram (CELEXA) 10 MG tablet Take 10 mg by mouth daily.     Continuous Blood Gluc Sensor (FREESTYLE LIBRE 2 SENSOR) MISC 1 Device by Does not apply route every 14 (fourteen) days. 6 each 3   glucose blood (CONTOUR NEXT TEST) test strip USE TO TEST BLOOD SUGAR FIVE TIMES DAILY 100 strip 11   glucose blood test strip Contour Next Test Strips  USE  TO TEST BLOOD SUGAR FIVE TIMES DAILY     hydroxychloroquine (PLAQUENIL) 200 MG tablet Take 200 mg by mouth 2 (two) times daily.     ibuprofen (ADVIL,MOTRIN) 200 MG tablet Take 400 mg by mouth 2 (two) times daily as needed for headache or moderate pain.     insulin lispro (HUMALOG) 100 UNIT/ML injection ADMINISTER 60 UNITS VIA INSULIN PUMP EVERY DAY 60 mL 3   levothyroxine (SYNTHROID) 75 MCG tablet TAKE 1 TABLET(75 MCG) BY MOUTH DAILY BEFORE AND BREAKFAST 90 tablet 2   tirzepatide (MOUNJARO) 5 MG/0.5ML Pen Inject 5 mg into the skin once a week. 12 mL 3   No current facility-administered medications on file prior to visit.    Allergies  Allergen Reactions   Doxycycline Rash    Family History  Problem Relation Age of Onset   Coronary artery disease Mother    Congestive Heart Failure Maternal Grandmother    Brain cancer Maternal Grandfather        dx in his early 59s   Cancer Neg Hx    Diabetes Neg Hx    Colon cancer Neg Hx    Esophageal cancer Neg Hx    Rectal cancer Neg Hx    Stomach cancer Neg Hx     BP 132/70    Pulse 86    Ht 5\' 4"  (1.626 m)    Wt 161 lb 3.2 oz (73.1 kg)    LMP 02/07/2013    SpO2 99%    BMI 27.67 kg/m    Review of Systems Denies N/V/HB.  She has lost 15 more lbs.      Objective:   Physical Exam    A1c=6.6%    Assessment & Plan:  Type 1 DM Hypoglycemia, due to insulin: we'll favor Mounjaro rx.     Patient Instructions  Please continue these pump settings:  basal rate of 1 unit/HR (except 0.6 units/HR 12MN to 6AM). mealtime bolus of 0-6 units per meal.  correction bolus (which some people call "sensitivity," or "insulin sensitivity ratio," or just "isr") of 1 unit for each 40 by which your glucose exceeds 100).   Please continue the same [facility].   Please ask your insurance how much a new pump (670) would be.   Please come back for a follow-up appointment in 2-3 months.

## 2021-10-26 ENCOUNTER — Other Ambulatory Visit (HOSPITAL_COMMUNITY): Payer: Self-pay

## 2021-12-05 ENCOUNTER — Ambulatory Visit: Payer: 59 | Admitting: Endocrinology

## 2021-12-25 ENCOUNTER — Telehealth: Payer: Self-pay | Admitting: Pharmacy Technician

## 2021-12-25 ENCOUNTER — Other Ambulatory Visit (HOSPITAL_COMMUNITY): Payer: Self-pay

## 2021-12-25 ENCOUNTER — Telehealth: Payer: Self-pay | Admitting: Hematology and Oncology

## 2021-12-25 NOTE — Telephone Encounter (Signed)
Patient Advocate Encounter ?  ?Received notification from CoverMyMeds that prior authorization for Mounjaro '5mg'$ /0.93m is required by his/her insurance [employer]. ? ?Per Test Claim: Step Therapy: Hx of suboptimal response, ?contraindication or intolerance to ?Metformin required ?PA Required call [phone]?Drug Requires Prior Authorization ?  ?PA submitted on 12/25/21 even though I don't see documents that the pt has had metformin, likely because she is type 1 instead of type 2.  ?Key [id number]?Status is pending ?   ?[facility] will continue to follow: ? ?Patient Advocate ?Fax:  [phone]? ?

## 2021-12-25 NOTE — Telephone Encounter (Signed)
Rescheduled appointment per patients request due to being out of town. Patient is aware of the changes made to her upcoming appointment. ?

## 2021-12-28 NOTE — Telephone Encounter (Signed)
Patient Advocate Encounter  Received notification from [employer] that the request for prior authorization for Jasmine Schwartz has been denied due to Not meeting established medication-specific criteria or guidelines set forth by the pt's current ins plan. Criteria: (1) You have a diagnosis of type 2 diabetes mellitus confirmed by accepted laboratory testing methodologies per treatment guidelines (for example, A1C greater than or equal to 6.5%, fasting plasma glucose greater than or equal to '126mg'$ /dL and/or 2-hour plasma glucose greater than or equal to '200mg'$ /dL). (2) You have suboptimal response (after a 33-monthtrial), contraindication or intolerance to metformin (generic Glucophage, Glucophage XR).

## 2022-01-22 ENCOUNTER — Ambulatory Visit: Payer: 59 | Admitting: Hematology and Oncology

## 2022-02-08 NOTE — Assessment & Plan Note (Deleted)
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 Patient has underwent neoadjuvant treatment, followed by bilateral mastectomies with a complete pathologic response. She then completed a calendar year of Herceptin.  Breast Cancer Surveillance: 1. Breast exam11/30/2018: Normal chest wall exam 2. Mammogram: No role of imaging.  Hypothyroidism: being managed with primary care Diabetes: I discussed with her extensively about cutting down her sugar intake.  CT chest on 12/05/2017: Interval development of 1 cm left supraclavicular lymph node, enlarged right paratracheal and aortopulmonary lymph nodes concerning for metastatic disease. Multifocal airspace disease both lungs concerning for multifocal pneumonia Her mother and father both died of COVID-21 in November 06, 2018.  01/25/2018: Biopsy L Supraclav LN: No malignancy  Return to clinic in 1 year for follow-up.  After that she can be seen on an as-needed basis.

## 2022-02-09 ENCOUNTER — Inpatient Hospital Stay: Payer: 59 | Attending: Hematology and Oncology | Admitting: Hematology and Oncology

## 2022-02-09 DIAGNOSIS — Z171 Estrogen receptor negative status [ER-]: Secondary | ICD-10-CM

## 2022-10-01 ENCOUNTER — Ambulatory Visit: Payer: 59 | Admitting: Internal Medicine

## 2022-10-01 ENCOUNTER — Encounter: Payer: Self-pay | Admitting: Internal Medicine

## 2022-10-01 VITALS — BP 128/80 | HR 82 | Ht 64.0 in | Wt 138.0 lb

## 2022-10-01 DIAGNOSIS — E1065 Type 1 diabetes mellitus with hyperglycemia: Secondary | ICD-10-CM | POA: Diagnosis not present

## 2022-10-01 DIAGNOSIS — E039 Hypothyroidism, unspecified: Secondary | ICD-10-CM

## 2022-10-01 LAB — POCT GLYCOSYLATED HEMOGLOBIN (HGB A1C): Hemoglobin A1C: 7.2 % — AB (ref 4.0–5.6)

## 2022-10-01 MED ORDER — LEVOTHYROXINE SODIUM 75 MCG PO TABS: 75.0000 ug | ORAL_TABLET | Freq: Every day | ORAL | 2 refills | Status: DC

## 2022-10-01 MED ORDER — DEXCOM G7 SENSOR MISC: 1.0000 | 3 refills | Status: DC

## 2022-10-01 MED ORDER — METFORMIN HCL ER 500 MG PO TB24: 500.0000 mg | ORAL_TABLET | Freq: Two times a day (BID) | ORAL | 2 refills | Status: DC

## 2022-10-01 NOTE — Patient Instructions (Signed)
Start Metformin 500 mg , 1 tablet with Breakfast for 1 week, if no ide effects, please increase to 2 tablets daily     HOW TO TREAT LOW BLOOD SUGARS (Blood sugar LESS THAN 70 MG/DL) Please follow the RULE OF 15 for the treatment of hypoglycemia treatment (when your (blood sugars are less than 70 mg/dL)   STEP 1: Take 15 grams of carbohydrates when your blood sugar is low, which includes:  3-4 GLUCOSE TABS  OR 3-4 OZ OF JUICE OR REGULAR SODA OR ONE TUBE OF GLUCOSE GEL    STEP 2: RECHECK blood sugar in 15 MINUTES STEP 3: If your blood sugar is still low at the 15 minute recheck --> then, go back to STEP 1 and treat AGAIN with another 15 grams of carbohydrates.

## 2022-10-01 NOTE — Progress Notes (Addendum)
Name: Jasmine Schwartz  Age/ Sex: 57 y.o., female   MRN/ DOB: [id number], [birth year]     PCP: Greig Right, MD   Reason for Endocrinology Evaluation: Type 1 Diabetes Mellitus  Initial Endocrine Consultative Visit: 10/20/2012    PATIENT IDENTIFIER: Jasmine Schwartz is a 57 y.o. female with a past medical history of DM, Hx of breast Ca and SLE and hypothyroidism. The patient has followed with Endocrinology clinic since 10/20/2012 for consultative assistance with management of her diabetes.  DIABETIC HISTORY:  Jasmine Schwartz was diagnosed with DM 1990, she was on Medtronic pump since 2004 but switch to tandem in 2023. Her hemoglobin A1c has ranged from 7.2% in 2018, peaking at 8.7% in 2019.  She was followed by Dr. Loanne Drilling from 2014 until 08/2021 She was on [facility] in 2023 which has improved her weight and glucose control, but unable to continue as is not covered by her insurance due to type I DM diagnosis   Saw Dr.Doerr in 2023  SUBJECTIVE:   During the last visit (09/05/2021): Saw Dr. Loanne Drilling  Today (10/01/2022): Jasmine Schwartz  She checks her blood sugars 4 x times daily. The patient has  had hypoglycemic episodes since the last clinic visit, which typically occur in the mornings  , that she attributes to mounjaro intake   Denies local neck swelling  Denies nausea or vomiting  Denies constipation nor diarrhea   This patient with type 1 diabetes is treated with tandem(insulin pump). During the visit the pump basal and bolus doses were reviewed including carb/insulin rations and supplemental doses. The clinical list was updated. The glucose meter download was reviewed in detail to determine if the current pump settings are providing the best glycemic control without excessive hypoglycemia.   Pump and meter download:    Pump   tandem Settings   Insulin type   Humalog   Basal rate       0000 0.850 u/h    0600 0.850   1200 1.00      I:C ratio       0000  1:15                   Sensitivity       0000  50      Goal       0000  110            Type & Model of Pump: Tandem Insulin Type: Currently using Humalog.  Body mass index is 23.69 kg/m.  PUMP STATISTICS: Average BG: 121 Average Daily Carbs (g): 0  Average Total Daily Insulin: 22.27 Average Daily Basal: 20 (90%) Average Daily Bolus: 2 (10%)       HOME DIABETES REGIMEN:  Humalog  Mounjaro   Statin: no ACE-I/ARB: no   METER DOWNLOAD SUMMARY: n/a    DIABETIC COMPLICATIONS: Microvascular complications:   Denies: CKD, neuropathy  Last Eye Exam: Completed 08/2022  Macrovascular complications:   Denies: CAD, CVA, PVD   HISTORY:  Past Medical History:  Past Medical History:  Diagnosis Date   Anxiety    Anxiety and depression 07/06/2011   Breast cancer ([place]) 01/01/13   /metastatic 1/1 lymph nodes, chemo, radiation, surgery   Depression    Diabetes mellitus    Type I   Dyslipidemia    Hypothyroidism    Lower extremity edema    Lymphadenopathy, supraclavicular    PONV (postoperative nausea and vomiting)    Type I diabetes mellitus ([place])    uses  insulin pump   Venous insufficiency    Past Surgical History:  Past Surgical History:  Procedure Laterality Date   BREAST BIOPSY Right 01/01/13   Invasive mammary ca,metastatic in 1/1 lymph node   BREAST BIOPSY Left 01/09/13   Fibroadenoma,microcalcifications   CESAREAN SECTION  1989   I & D EXTREMITY Right 05/07/2018   Procedure: BURSECTOMY RIGHT ELBOW;  Surgeon: Newt Minion, MD;  Location: [place];  Service: Orthopedics;  Laterality: Right;   LUMBAR DISC SURGERY  2009   Dr Maxie Better   MASTECTOMY COMPLETE / SIMPLE Left 06/09/2013   MASTECTOMY MODIFIED RADICAL Right 06/09/2013   MASTECTOMY MODIFIED RADICAL Right 06/09/2013   Procedure: RIGHT MASTECTOMY MODIFIED RADICAL;  Surgeon: Adin Hector, MD;  Location: [place];  Service: General;  Laterality: Right;   PORTACATH PLACEMENT Left 01/2013   SIMPLE MASTECTOMY WITH AXILLARY  SENTINEL NODE BIOPSY Left 06/09/2013   Procedure: LEFT SIMPLE MASTECTOMY;  Surgeon: Adin Hector, MD;  Location: [place];  Service: General;  Laterality: Left;   TUBAL LIGATION  1991   Social History:  reports that she has never smoked. She has never used smokeless tobacco. She reports that she does not drink alcohol and does not use drugs. Family History:  Family History  Problem Relation Age of Onset   Coronary artery disease Mother    Congestive Heart Failure Maternal Grandmother    Brain cancer Maternal Grandfather        dx in his early 7s   Cancer Neg Hx    Diabetes Neg Hx    Colon cancer Neg Hx    Esophageal cancer Neg Hx    Rectal cancer Neg Hx    Stomach cancer Neg Hx      HOME MEDICATIONS: Allergies as of 10/01/2022       Reactions   Doxycycline Rash        Medication List        Accurate as of October 01, 2022 12:22 PM. If you have any questions, ask your nurse or doctor.          STOP taking these medications    FreeStyle Libre 2 Sensor Misc Stopped by: Dorita Sciara, MD   tirzepatide 5 MG/0.5ML Pen Commonly known as: MOUNJARO Stopped by: Dorita Sciara, MD       TAKE these medications    citalopram 10 MG tablet Commonly known as: CELEXA Take 10 mg by mouth daily.   glucose blood test strip Contour Next Test Strips  USE TO TEST BLOOD SUGAR FIVE TIMES DAILY   Contour Next Test test strip Generic drug: glucose blood USE TO TEST BLOOD SUGAR FIVE TIMES DAILY   HumaLOG 100 UNIT/ML injection Generic drug: insulin lispro ADMINISTER 60 UNITS VIA INSULIN PUMP EVERY DAY   hydroxychloroquine 200 MG tablet Commonly known as: PLAQUENIL Take 200 mg by mouth 2 (two) times daily.   ibuprofen 200 MG tablet Commonly known as: ADVIL Take 400 mg by mouth 2 (two) times daily as needed for headache or moderate pain.   levothyroxine 75 MCG tablet Commonly known as: SYNTHROID TAKE 1 TABLET(75 MCG) BY MOUTH DAILY BEFORE AND  BREAKFAST   vitamin C 1000 MG tablet Take 2,000 mg by mouth 2 (two) times daily.         OBJECTIVE:   Vital Signs: BP 128/80 (BP Location: Left Arm, Patient Position: Sitting, Cuff Size: Normal)   Pulse 82   Ht 5' 4"$  (1.626 m)   Wt 138 lb (62.6  kg)   LMP 01/11/2013   SpO2 96%   BMI 23.69 kg/m   Wt Readings from Last 3 Encounters:  10/01/22 138 lb (62.6 kg)  09/05/21 161 lb 3.2 oz (73.1 kg)  06/08/21 176 lb 3.2 oz (79.9 kg)     Exam: General: Pt appears well and is in NAD  Neck: General: Supple without adenopathy. Thyroid: No goiter or nodules appreciated.   Lungs: Clear with good BS bilat   Heart: RRR   Abdomen:  soft, nontender  Extremities: No pretibial edema.   Neuro: MS is good with appropriate affect, pt is alert and Ox3    DM foot exam: 10/01/2022  The skin of the feet is without sores or ulcerations, plantar callus formation has been noted at the forefoot The pedal pulses are 2+ on right and 2+ on left. The sensation is intact to a screening 5.07, 10 gram monofilament bilaterally          DATA REVIEWED:  Lab Results  Component Value Date   HGBA1C 7.2 (A) 10/01/2022   HGBA1C 7.6 (A) 05/16/2021   HGBA1C 7.4 (A) 06/16/2020   Lab Results  Component Value Date   MICROALBUR <0.7 06/19/2017   LDLCALC 68 05/16/2021   CREATININE 0.79 05/16/2021   Lab Results  Component Value Date   MICRALBCREAT 0.6 06/19/2017     Lab Results  Component Value Date   CHOL 167 05/16/2021   HDL 84.90 05/16/2021   LDLCALC 68 05/16/2021   TRIG 68.0 05/16/2021   CHOLHDL 2 05/16/2021         ASSESSMENT / PLAN / RECOMMENDATIONS:   1) Type 1 Diabetes Mellitus, Optimally controlled, Without complications - Most recent A1c of 7.2 %. Goal A1c < 7.0 %.    -Patient with improving glycemic control, she attributes this to [facility] use, she is running out of the coupon that was valid for 1 year, insurance is not covering GLP-1 agonist due to type 1 diabetes  diagnosis -She has been noted with hypoglycemia overnight, I was initially going to reduce her basal rate at nighttime, but since she is not going to use Mounjaro anymore we would not make any changes at this time -We discussed alternative therapy to include metformin to help with insulin resistance, discussed GI side effects.  She she is in agreement of trying this, instructions to titrate up was provided -She has not been on on CGM technology, a sample of Dexcom G7 was given today, a prescription was sent to the pharmacy, patient to contact tandem in case she needs help with connecting Dexcom to tandem   MEDICATIONS: Start metformin 500 mg XR, 2 tab twice daily Humalog per pump  EDUCATION / INSTRUCTIONS: BG monitoring instructions: Patient is instructed to check her blood sugars 3 times a day, before meals. Call [place] [facility] if: BG persistently < 70  I reviewed the Rule of 15 for the treatment of hypoglycemia in detail with the patient. Literature supplied.    2) Diabetic complications:  Eye: Does not have known diabetic retinopathy.  Neuro/ Feet: Does not have known diabetic peripheral neuropathy .  Renal: Patient does not have known baseline CKD. She   is not on an ACEI/ARB at present.     3)Hypothyroidism:  -Patient is clinically euthyroid -No local neck symptoms -Historically her TSH is the upper limit of normal - Pt educated extensively on the correct way to take levothyroxine (first thing in the morning with water, 30 minutes before eating or taking other  medications). - Pt encouraged to double dose the following day if she were to miss a dose given long half-life of levothyroxine.   Medication Continue levothyroxine 75 mcg daily  F/U in 4 months     Signed electronically by: Mack Guise, MD  [facility]  [facility] [address], [place] [address] Phone: [phone] FAX:  [phone]   CC: Greig Right, MD [address] [place] [address] Phone: [phone]  Fax: [phone]  Return to Endocrinology clinic as below: No future appointments.

## 2022-10-02 ENCOUNTER — Encounter: Payer: Self-pay | Admitting: Internal Medicine

## 2022-10-03 ENCOUNTER — Telehealth: Payer: Self-pay

## 2022-10-03 NOTE — Telephone Encounter (Signed)
Vm left from [facility] that Dexcom G7 needs PA .

## 2022-10-11 ENCOUNTER — Telehealth: Payer: Self-pay

## 2022-10-11 ENCOUNTER — Other Ambulatory Visit (HOSPITAL_COMMUNITY): Payer: Self-pay

## 2022-10-11 NOTE — Telephone Encounter (Signed)
Spoke with [employer] and answer questions for approval on Dexcom .  Waiting for determination.

## 2022-10-11 NOTE — Telephone Encounter (Signed)
Faxed over PA form and chart notes to [phone] to [employer].

## 2022-10-25 ENCOUNTER — Other Ambulatory Visit (HOSPITAL_COMMUNITY): Payer: Self-pay

## 2022-10-26 ENCOUNTER — Other Ambulatory Visit (HOSPITAL_COMMUNITY): Payer: Self-pay

## 2022-11-07 ENCOUNTER — Telehealth: Payer: Self-pay | Admitting: Pharmacy Technician

## 2022-11-07 NOTE — Telephone Encounter (Signed)
Pharmacy Patient Advocate Encounter   Resubmitted PA request for Dexcom.   PA submitted on 11/07/22 to (ins) [employer] via CoverMyMeds Key or (Medicaid) confirmation # [id number] - PA Case ID: [id number] Status is pending

## 2022-11-19 ENCOUNTER — Other Ambulatory Visit (HOSPITAL_COMMUNITY): Payer: Self-pay

## 2022-11-19 NOTE — Telephone Encounter (Signed)
Pharmacy Patient Advocate Encounter  Prior Authorization for Dexcom G7 sensors has been approved by [employer] (ins).    PA # [id number] Effective dates: 11/07/22 through 11/07/23  Per test claim, pt has already filled. Next eligible fill date is 12/02/22

## 2023-01-03 ENCOUNTER — Encounter: Payer: Self-pay | Admitting: Internal Medicine

## 2023-01-03 ENCOUNTER — Ambulatory Visit: Payer: 59 | Admitting: Internal Medicine

## 2023-01-03 ENCOUNTER — Telehealth: Payer: Self-pay | Admitting: Dietician

## 2023-01-03 VITALS — BP 124/74 | HR 78 | Ht 64.0 in | Wt 146.0 lb

## 2023-01-03 DIAGNOSIS — E039 Hypothyroidism, unspecified: Secondary | ICD-10-CM | POA: Diagnosis not present

## 2023-01-03 DIAGNOSIS — E109 Type 1 diabetes mellitus without complications: Secondary | ICD-10-CM | POA: Diagnosis not present

## 2023-01-03 LAB — TSH: TSH: 2.67 u[IU]/mL (ref 0.35–5.50)

## 2023-01-03 LAB — LIPID PANEL
Cholesterol: 224 mg/dL — ABNORMAL HIGH (ref 0–200)
HDL: 98.8 mg/dL (ref >39.00)
LDL Cholesterol: 109 mg/dL — ABNORMAL HIGH (ref 0–99)
NonHDL: 125.33
Total CHOL/HDL Ratio: 2
Triglycerides: 83 mg/dL (ref 0.0–149.0)
VLDL: 16.6 mg/dL (ref 0.0–40.0)

## 2023-01-03 LAB — BASIC METABOLIC PANEL
BUN: 19 mg/dL (ref 6–23)
CO2: 32 mEq/L (ref 19–32)
Calcium: 9.6 mg/dL (ref 8.4–10.5)
Chloride: 99 mEq/L (ref 96–112)
Creatinine, Ser: 0.95 mg/dL (ref 0.40–1.20)
GFR: 66.39 mL/min (ref >60.00)
Glucose, Bld: 174 mg/dL — ABNORMAL HIGH (ref 70–99)
Potassium: 4.3 mEq/L (ref 3.5–5.1)
Sodium: 136 mEq/L (ref 135–145)

## 2023-01-03 LAB — POCT GLYCOSYLATED HEMOGLOBIN (HGB A1C): Hemoglobin A1C: 6.9 % — AB (ref 4.0–5.6)

## 2023-01-03 LAB — MICROALBUMIN / CREATININE URINE RATIO
Creatinine,U: 119.4 mg/dL
Microalb Creat Ratio: 0.7 mg/g (ref 0.0–30.0)
Microalb, Ur: 0.8 mg/dL (ref 0.0–1.9)

## 2023-01-03 NOTE — Progress Notes (Signed)
Name: Jasmine Schwartz  Age/ Sex: 57 y.o., female   MRN/ DOB: [id number], 1966-07-30     PCP: Alinda Deem, MD   Reason for Endocrinology Evaluation: Type 1 Diabetes Mellitus  Initial Endocrine Consultative Visit: 10/20/2012    PATIENT IDENTIFIER: Jasmine Schwartz is a 57 y.o. female with a past medical history of DM, Hx of breast Ca and SLE and hypothyroidism. The patient has followed with Endocrinology clinic since 10/20/2012 for consultative assistance with management of her diabetes.  DIABETIC HISTORY:  [name] was diagnosed with DM 1990, she was on Medtronic pump since 2004 but switch to tandem in 2023. Her hemoglobin A1c has ranged from 7.2% in 2018, peaking at 8.7% in 2019.  She was followed by Dr. Everardo All from 2014 until 08/2021 She was on [facility] in 2023 which has improved her weight and glucose control, but unable to continue as is not covered by her insurance due to type I DM diagnosis   Saw Dr.Doerr in 2023   She establish care with me 10/01/2022, A1c was 7.2%, she was already on tandem pump, and we started metformin to improve insulin resistance, as her insurance would not cover GLP-1 agonist  SUBJECTIVE:   During the last visit (10/01/2022): A1c 7.2%  Today (01/03/2023): Ms. Bannon  She checks her blood sugars 4 x times daily. The patient has  had hypoglycemic episodes since the last clinic visit, which typically occur in the mornings  , that she attributes to mounjaro intake    She has been using dexcom G7 for 6 weeks   Denies recent nausea or diarrhea   This patient with type 1 diabetes is treated with tandem(insulin pump). During the visit the pump basal and bolus doses were reviewed including carb/insulin rations and supplemental doses. The clinical list was updated. The glucose meter download was reviewed in detail to determine if the current pump settings are providing the best glycemic control without excessive hypoglycemia.   Pump and meter download:    Pump    tandem Settings   Insulin type   Humalog   Basal rate       0000 0.850 u/h    0600 0.850   1200 1.00      I:C ratio       0000  1:15                  Sensitivity       0000  50      Goal       0000  110            Type & Model of Pump: Tandem Insulin Type: Currently using Humalog.  Body mass index is 25.06 kg/m.  PUMP STATISTICS: Average BG:208 Average Daily Carbs (g): 5 Average Total Daily Insulin: 29.7 Average Daily Basal: 20.94(70%) Average Daily Bolus: 8.76 (30%)       HOME DIABETES REGIMEN:  Metformin 500 mg XR, 1 tabs twice daily Humalog  Levothyroxine 75 mcg daily  Statin: no ACE-I/ARB: no     DIABETIC COMPLICATIONS: Microvascular complications:   Denies: CKD, neuropathy  Last Eye Exam: Completed 08/2022  Macrovascular complications:   Denies: CAD, CVA, PVD   HISTORY:  Past Medical History:  Past Medical History:  Diagnosis Date   Anxiety    Anxiety and depression 07/06/2011   Breast cancer (HCC) 01/01/13   /metastatic 1/1 lymph nodes, chemo, radiation, surgery   Depression    Diabetes mellitus    Type I  Dyslipidemia    Hypothyroidism    Lower extremity edema    Lymphadenopathy, supraclavicular    PONV (postoperative nausea and vomiting)    Type I diabetes mellitus (HCC)    uses insulin pump   Venous insufficiency    Past Surgical History:  Past Surgical History:  Procedure Laterality Date   BREAST BIOPSY Right 01/01/13   Invasive mammary ca,metastatic in 1/1 lymph node   BREAST BIOPSY Left 01/09/13   Fibroadenoma,microcalcifications   CESAREAN SECTION  1989   I & D EXTREMITY Right 05/07/2018   Procedure: BURSECTOMY RIGHT ELBOW;  Surgeon: Nadara Mustard, MD;  Location: [facility] OR;  Service: Orthopedics;  Laterality: Right;   LUMBAR DISC SURGERY  2009   Dr Jillyn Hidden   MASTECTOMY COMPLETE / SIMPLE Left 06/09/2013   MASTECTOMY MODIFIED RADICAL Right 06/09/2013   MASTECTOMY MODIFIED RADICAL Right 06/09/2013   Procedure: RIGHT  MASTECTOMY MODIFIED RADICAL;  Surgeon: Ernestene Mention, MD;  Location: [facility] OR;  Service: General;  Laterality: Right;   PORTACATH PLACEMENT Left 01/2013   SIMPLE MASTECTOMY WITH AXILLARY SENTINEL NODE BIOPSY Left 06/09/2013   Procedure: LEFT SIMPLE MASTECTOMY;  Surgeon: Ernestene Mention, MD;  Location: [facility] OR;  Service: General;  Laterality: Left;   TUBAL LIGATION  1991   Social History:  reports that she has never smoked. She has never used smokeless tobacco. She reports that she does not drink alcohol and does not use drugs. Family History:  Family History  Problem Relation Age of Onset   Coronary artery disease Mother    Congestive Heart Failure Maternal Grandmother    Brain cancer Maternal Grandfather        dx in his early 2s   Cancer Neg Hx    Diabetes Neg Hx    Colon cancer Neg Hx    Esophageal cancer Neg Hx    Rectal cancer Neg Hx    Stomach cancer Neg Hx      HOME MEDICATIONS: Allergies as of 01/03/2023       Reactions   Doxycycline Rash        Medication List        Accurate as of Jan 03, 2023  1:07 PM. If you have any questions, ask your nurse or doctor.          citalopram 10 MG tablet Commonly known as: CELEXA Take 10 mg by mouth daily.   Dexcom G7 Sensor Misc 1 Device by Does not apply route as directed.   glucose blood test strip Contour Next Test Strips  USE TO TEST BLOOD SUGAR FIVE TIMES DAILY   Contour Next Test test strip Generic drug: glucose blood USE TO TEST BLOOD SUGAR FIVE TIMES DAILY   HumaLOG 100 UNIT/ML injection Generic drug: insulin lispro ADMINISTER 60 UNITS VIA INSULIN PUMP EVERY DAY   hydroxychloroquine 200 MG tablet Commonly known as: PLAQUENIL Take 200 mg by mouth 2 (two) times daily.   ibuprofen 200 MG tablet Commonly known as: ADVIL Take 400 mg by mouth 2 (two) times daily as needed for headache or moderate pain.   levothyroxine 75 MCG tablet Commonly known as: SYNTHROID Take 1 tablet (75 mcg total) by mouth  daily before breakfast.   metFORMIN 500 MG 24 hr tablet Commonly known as: GLUCOPHAGE-XR Take 1 tablet (500 mg total) by mouth 2 (two) times daily with a meal.   vitamin C 1000 MG tablet Take 2,000 mg by mouth 2 (two) times daily.  OBJECTIVE:   Vital Signs: BP 124/74 (BP Location: Left Arm, Patient Position: Sitting, Cuff Size: Small)   Pulse 78   Ht 5\' 4"  (1.626 m)   Wt 146 lb (66.2 kg)   LMP 01/11/2013   SpO2 97%   BMI 25.06 kg/m   Wt Readings from Last 3 Encounters:  01/03/23 146 lb (66.2 kg)  10/01/22 138 lb (62.6 kg)  09/05/21 161 lb 3.2 oz (73.1 kg)     Exam: General: Pt appears well and is in NAD  Neck: General: Supple without adenopathy. Thyroid: No goiter or nodules appreciated.   Lungs: Clear with good BS bilat   Heart: RRR   Abdomen:  soft, nontender  Extremities: No pretibial edema.   Neuro: MS is good with appropriate affect, pt is alert and Ox3    DM foot exam: 01/03/2023  The skin of the feet is without sores or ulcerations, plantar callus formation has been noted at the forefoot The pedal pulses are 2+ on right and 2+ on left. The sensation is intact to a screening 5.07, 10 gram monofilament bilaterally          DATA REVIEWED:  Lab Results  Component Value Date   HGBA1C 7.2 (A) 10/01/2022   HGBA1C 7.6 (A) 05/16/2021   HGBA1C 7.4 (A) 06/16/2020    Latest Reference Range & Units 01/03/23 13:49  Sodium 135 - 145 mEq/L 136  Potassium 3.5 - 5.1 mEq/L 4.3  Chloride 96 - 112 mEq/L 99  CO2 19 - 32 mEq/L 32  Glucose 70 - 99 mg/dL 409 (H)  BUN 6 - 23 mg/dL 19  Creatinine 8.11 - 9.14 mg/dL 7.82  Calcium 8.4 - 95.6 mg/dL 9.6  GFR >21.30 mL/min 66.39    Latest Reference Range & Units 01/03/23 13:49  Total CHOL/HDL Ratio  2  Cholesterol 0 - 200 mg/dL 865 (H)  HDL Cholesterol >39.00 mg/dL 78.46  LDL (calc) 0 - 99 mg/dL 962 (H)  MICROALB/CREAT RATIO 0.0 - 30.0 mg/g 0.7  NonHDL  125.33  Triglycerides 0.0 - 149.0 mg/dL 95.2  VLDL 0.0  - 84.1 mg/dL 32.4    Latest Reference Range & Units 01/03/23 13:49  TSH 0.35 - 5.50 uIU/mL 2.67    Latest Reference Range & Units 01/03/23 13:49  Creatinine,U mg/dL 401.0  Microalb, Ur 0.0 - 1.9 mg/dL 0.8  MICROALB/CREAT RATIO 0.0 - 30.0 mg/g 0.7     ASSESSMENT / PLAN / RECOMMENDATIONS:   1) Type 1 Diabetes Mellitus, Optimally controlled, Without complications - Most recent A1c of 6.9 %. Goal A1c < 7.0 %.    -A1c has improved from 7.2% to 6.9% -She is tolerating metformin, this was started because her insurance would not cover GLP-1 agonist due to type I DM diagnosis -She was recently upgraded from Dexcom G6 to [facility] G7, she has not been able to connect her pump to the Dexcom G7, I have put in a referral to our CDE for help with this, so she has been in manual mode -It appears that the patient does not always enter carbohydrates but rather insulin doses.  I did encourage the patient to enter carbohydrates into the pump and let the pump due to calculation for the insulin that needs to be given -She endorses hypoglycemia in the morning sometime between 7 AM and noon, I have reduced her basal rate for that time -I did advise the patient to enter glucose readings into the pump while she is in manual mode before each meal and bedtime  until she was back to automatic mode  MEDICATIONS: Cotinue Metformin 500 mg XR, 1 tab twice daily Humalog per pump     Pump   tandem Settings   Insulin type   Humalog   Basal rate       0000 0.850 u/h    0600 0.800   1200 1.00      I:C ratio       0000  1:15                  Sensitivity       0000  50      Goal       0000  110        EDUCATION / INSTRUCTIONS: BG monitoring instructions: Patient is instructed to check her blood sugars 3 times a day, before meals. Call [place] [facility] if: BG persistently < 70  I reviewed the Rule of 15 for the treatment of hypoglycemia in detail with the patient. Literature  supplied.    2) Diabetic complications:  Eye: Does not have known diabetic retinopathy.  Neuro/ Feet: Does not have known diabetic peripheral neuropathy .  Renal: Patient does not have known baseline CKD. She   is not on an ACEI/ARB at present.     3)Hypothyroidism:  -Patient is clinically euthyroid -No local neck symptoms   Medication Continue levothyroxine 75 mcg daily   3.Dyslipidemia :  -LDL above goal -Patient is not on statin therapy, will encourage lifestyle changes at this time -Will consider statin therapy in the future   F/U in 6 months     Signed electronically by: Lyndle Herrlich, MD  [facility] Endocrinology  [facility] [address] [place] [place], [place] [address] Phone: [phone] FAX: [phone]   CC: Alinda Deem, MD [address] [place] [place] [address] Phone: [phone]  Fax: [phone]  Return to Endocrinology clinic as below: No future appointments.

## 2023-01-03 NOTE — Telephone Encounter (Signed)
Returned patient call. She currently uses the t:slim insulin pump but has not used the Dexcom CGM until recently and she needs help connecting the two devices.  Appointment with Bonita Quin made.  Oran Rein, RD, LDN, CDCES

## 2023-01-03 NOTE — Patient Instructions (Signed)

## 2023-01-21 ENCOUNTER — Encounter: Payer: 59 | Admitting: Nutrition

## 2023-01-28 ENCOUNTER — Ambulatory Visit: Payer: 59 | Admitting: Nutrition

## 2023-02-12 LAB — HM DIABETES EYE EXAM

## 2023-03-21 ENCOUNTER — Ambulatory Visit: Payer: 59 | Admitting: Internal Medicine

## 2023-04-24 ENCOUNTER — Telehealth: Payer: Self-pay

## 2023-04-24 MED ORDER — INSULIN LISPRO 100 UNIT/ML IJ SOLN: INTRAMUSCULAR | 1 refills | Status: DC

## 2023-04-24 NOTE — Telephone Encounter (Signed)
Patient requesting a refill on her Humalog. Prescription on file is expired. Okay to send with same direction? Last written by Dr. Everardo All

## 2023-04-24 NOTE — Telephone Encounter (Signed)
done

## 2023-04-30 ENCOUNTER — Telehealth: Payer: Self-pay

## 2023-04-30 ENCOUNTER — Other Ambulatory Visit: Payer: Self-pay

## 2023-04-30 ENCOUNTER — Other Ambulatory Visit (HOSPITAL_COMMUNITY): Payer: Self-pay

## 2023-04-30 MED ORDER — INSULIN LISPRO 100 UNIT/ML IJ SOLN: INTRAMUSCULAR | 1 refills | Status: DC

## 2023-04-30 NOTE — Telephone Encounter (Signed)
Can we do a PA on the Humalog are find out what's covered

## 2023-07-02 ENCOUNTER — Encounter: Payer: Self-pay | Admitting: Internal Medicine

## 2023-07-02 ENCOUNTER — Ambulatory Visit (INDEPENDENT_AMBULATORY_CARE_PROVIDER_SITE_OTHER): Payer: Self-pay | Admitting: Internal Medicine

## 2023-07-02 VITALS — BP 126/74 | HR 77 | Ht 64.0 in | Wt 150.8 lb

## 2023-07-02 DIAGNOSIS — E109 Type 1 diabetes mellitus without complications: Secondary | ICD-10-CM

## 2023-07-02 DIAGNOSIS — E039 Hypothyroidism, unspecified: Secondary | ICD-10-CM

## 2023-07-02 DIAGNOSIS — E785 Hyperlipidemia, unspecified: Secondary | ICD-10-CM | POA: Insufficient documentation

## 2023-07-02 LAB — POCT GLYCOSYLATED HEMOGLOBIN (HGB A1C): Hemoglobin A1C: 6.6 % — AB (ref 4.0–5.6)

## 2023-07-02 MED ORDER — METFORMIN HCL ER 500 MG PO TB24: 500.0000 mg | ORAL_TABLET | Freq: Two times a day (BID) | ORAL | 2 refills | Status: DC

## 2023-07-02 MED ORDER — LEVOTHYROXINE SODIUM 75 MCG PO TABS: 75.0000 ug | ORAL_TABLET | Freq: Every day | ORAL | 2 refills | Status: DC

## 2023-07-02 NOTE — Progress Notes (Signed)
Name: Jasmine Schwartz  Age/ Sex: 57 y.o., female   MRN/ DOB: [id number], March 08, 1966     PCP: Alinda Deem, MD   Reason for Endocrinology Evaluation: Type 1 Diabetes Mellitus  Initial Endocrine Consultative Visit: 10/20/2012    PATIENT IDENTIFIER: Jasmine Schwartz is a 57 y.o. female with a past medical history of DM, Hx of breast Ca and SLE and hypothyroidism. The patient has followed with Endocrinology clinic since 10/20/2012 for consultative assistance with management of her diabetes.  DIABETIC HISTORY:  [name] was diagnosed with DM 1990, she was on Medtronic pump since 2004 but switch to tandem in 2023. Her hemoglobin A1c has ranged from 7.2% in 2018, peaking at 8.7% in 2019.  She was followed by Dr. Everardo All from 2014 until 08/2021 She was on [facility] in 2023 which has improved her weight and glucose control, but unable to continue as is not covered by her insurance due to type I DM diagnosis   Saw Dr.Doerr in 2023   She establish care with me 10/01/2022, A1c was 7.2%, she was already on tandem pump, and we started metformin to improve insulin resistance, as her insurance would not cover GLP-1 agonist  SUBJECTIVE:   During the last visit (01/03/2023): A1c 6.9%  Today (07/02/2023): Jasmine Schwartz  She checks her blood sugars 4 x times daily. 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 During the visit the pump basal and bolus doses were reviewed including carb/insulin rations and supplemental doses. The clinical list was updated. The glucose meter download was reviewed in detail to determine if the current pump settings are providing the  best glycemic control without excessive hypoglycemia.   Pump and meter download:      Pump   tandem Settings   Insulin type   Humalog   Basal rate       0000 0.850 u/h    0600 0.800   1200 1.00      I:C ratio       0000  1:15                  Sensitivity       0000  50      Goal       0000  110           Type & Model of Pump: Tandem Insulin Type: Currently using Humalog.  Body mass index is 25.88 kg/m.  PUMP STATISTICS: Average BG: 139 Average Daily Carbs (g): 54 Average Total Daily Insulin:28.68 Average Daily Basal: 19.48(68%) Average Daily Bolus: 9.19 (32%)       HOME DIABETES REGIMEN:  Metformin 500 mg XR, 1 tabs twice daily Humalog  Levothyroxine 75 mcg daily    Statin: no ACE-I/ARB: no  CONTINUOUS GLUCOSE MONITORING RECORD INTERPRETATION    Dates of Recording: 11/6 - 07/02/2023  Sensor description: Dexcom  Results statistics:   CGM use % of time 99  Average and SD 139/32  Time in range  89%  % Time Above 180 9.7  % Time above 250 0.2  % Time Below target 0.7   Glycemic patterns summary: BGs are optimal throughout the  day and night  Hyperglycemic episodes postprandial  Hypoglycemic episodes occurred where after correction bolus  Overnight periods: Optimal    DIABETIC COMPLICATIONS: Microvascular complications:   Denies: CKD, neuropathy  Last Eye Exam: Completed 08/2022  Macrovascular complications:   Denies: CAD, CVA, PVD   HISTORY:  Past Medical History:  Past Medical History:  Diagnosis Date   Anxiety    Anxiety and depression 07/06/2011   Breast cancer (HCC) 01/01/13   /metastatic 1/1 lymph nodes, chemo, radiation, surgery   Depression    Diabetes mellitus    Type I   Dyslipidemia    Hypothyroidism    Lower extremity edema    Lymphadenopathy, supraclavicular    PONV (postoperative nausea and vomiting)    Type I diabetes mellitus (HCC)    uses insulin pump   Venous insufficiency    Past Surgical  History:  Past Surgical History:  Procedure Laterality Date   BREAST BIOPSY Right 01/01/13   Invasive mammary ca,metastatic in 1/1 lymph node   BREAST BIOPSY Left 01/09/13   Fibroadenoma,microcalcifications   CESAREAN SECTION  1989   I & D EXTREMITY Right 05/07/2018   Procedure: BURSECTOMY RIGHT ELBOW;  Surgeon: Nadara Mustard, MD;  Location: [facility] OR;  Service: Orthopedics;  Laterality: Right;   LUMBAR DISC SURGERY  2009   Dr Jillyn Hidden   MASTECTOMY COMPLETE / SIMPLE Left 06/09/2013   MASTECTOMY MODIFIED RADICAL Right 06/09/2013   MASTECTOMY MODIFIED RADICAL Right 06/09/2013   Procedure: RIGHT MASTECTOMY MODIFIED RADICAL;  Surgeon: Ernestene Mention, MD;  Location: [facility] OR;  Service: General;  Laterality: Right;   PORTACATH PLACEMENT Left 01/2013   SIMPLE MASTECTOMY WITH AXILLARY SENTINEL NODE BIOPSY Left 06/09/2013   Procedure: LEFT SIMPLE MASTECTOMY;  Surgeon: Ernestene Mention, MD;  Location: [facility] OR;  Service: General;  Laterality: Left;   TUBAL LIGATION  1991   Social History:  reports that she has never smoked. She has never used smokeless tobacco. She reports that she does not drink alcohol and does not use drugs. Family History:  Family History  Problem Relation Age of Onset   Coronary artery disease Mother    Congestive Heart Failure Maternal Grandmother    Brain cancer Maternal Grandfather        dx in his early 34s   Cancer Neg Hx    Diabetes Neg Hx    Colon cancer Neg Hx    Esophageal cancer Neg Hx    Rectal cancer Neg Hx    Stomach cancer Neg Hx      HOME MEDICATIONS: Allergies as of 07/02/2023       Reactions   Doxycycline Rash        Medication List        Accurate as of July 02, 2023  8:03 AM. If you have any questions, ask your nurse or doctor.          citalopram 10 MG tablet Commonly known as: CELEXA Take 10 mg by mouth daily.   Dexcom G7 Sensor Misc 1 Device by Does not apply route as directed.   glucose blood test strip Contour Next Test  Strips  USE TO TEST BLOOD SUGAR FIVE TIMES DAILY   Contour Next Test test strip Generic drug: glucose blood USE TO TEST BLOOD SUGAR FIVE TIMES DAILY   HumaLOG 100 UNIT/ML injection Generic drug: insulin lispro ADMINISTER 60 UNITS VIA INSULIN PUMP EVERY DAY   insulin lispro 100 UNIT/ML injection Commonly known as: HumaLOG ADMINISTER 60 UNITS VIA INSULIN PUMP  EVERY DAY   hydroxychloroquine 200 MG tablet Commonly known as: PLAQUENIL Take 200 mg by mouth 2 (two) times daily.   ibuprofen 200 MG tablet Commonly known as: ADVIL Take 400 mg by mouth 2 (two) times daily as needed for headache or moderate pain.   levothyroxine 75 MCG tablet Commonly known as: SYNTHROID Take 1 tablet (75 mcg total) by mouth daily before breakfast.   meloxicam 7.5 MG tablet Commonly known as: MOBIC Take 7.5 mg by mouth daily as needed.   metFORMIN 500 MG 24 hr tablet Commonly known as: GLUCOPHAGE-XR Take 1 tablet (500 mg total) by mouth 2 (two) times daily with a meal.   pantoprazole 40 MG tablet Commonly known as: PROTONIX Take 1 tablet by mouth daily.   valACYclovir 1000 MG tablet Commonly known as: VALTREX TAKE 2 TABLETS BY MOUTH EVERY 12 HOURS FOR 1 DAY   vitamin C 1000 MG tablet Take 2,000 mg by mouth 2 (two) times daily.         OBJECTIVE:   Vital Signs: BP 126/74 (BP Location: Left Arm, Patient Position: Sitting, Cuff Size: Small)   Pulse 77   Ht 5\' 4"  (1.626 m)   Wt 150 lb 12.8 oz (68.4 kg)   LMP 01/11/2013   SpO2 99%   BMI 25.88 kg/m   Wt Readings from Last 3 Encounters:  07/02/23 150 lb 12.8 oz (68.4 kg)  01/03/23 146 lb (66.2 kg)  10/01/22 138 lb (62.6 kg)     Exam: General: Pt appears well and is in NAD  Neck: General: Supple without adenopathy. Thyroid: No goiter or nodules appreciated.   Lungs: Clear with good BS bilat   Heart: RRR   Abdomen:  soft, nontender  Extremities: No pretibial edema.   Neuro: MS is good with appropriate affect, pt is alert and Ox3     DM foot exam: 01/03/2023  The skin of the feet is without sores or ulcerations, plantar callus formation has been noted at the forefoot The pedal pulses are 2+ on right and 2+ on left. The sensation is intact to a screening 5.07, 10 gram monofilament bilaterally          DATA REVIEWED:  Lab Results  Component Value Date   HGBA1C 6.6 (A) 07/02/2023   HGBA1C 6.9 (A) 01/03/2023   HGBA1C 7.2 (A) 10/01/2022    Latest Reference Range & Units 01/03/23 13:49  Sodium 135 - 145 mEq/L 136  Potassium 3.5 - 5.1 mEq/L 4.3  Chloride 96 - 112 mEq/L 99  CO2 19 - 32 mEq/L 32  Glucose 70 - 99 mg/dL 161 (H)  BUN 6 - 23 mg/dL 19  Creatinine 0.96 - 0.45 mg/dL 4.09  Calcium 8.4 - 81.1 mg/dL 9.6  GFR >91.47 mL/min 66.39    Latest Reference Range & Units 01/03/23 13:49  Total CHOL/HDL Ratio  2  Cholesterol 0 - 200 mg/dL 829 (H)  HDL Cholesterol >39.00 mg/dL 56.21  LDL (calc) 0 - 99 mg/dL 308 (H)  MICROALB/CREAT RATIO 0.0 - 30.0 mg/g 0.7  NonHDL  125.33  Triglycerides 0.0 - 149.0 mg/dL 65.7  VLDL 0.0 - 84.6 mg/dL 96.2    Latest Reference Range & Units 01/03/23 13:49  TSH 0.35 - 5.50 uIU/mL 2.67    Latest Reference Range & Units 01/03/23 13:49  Creatinine,U mg/dL 952.8  Microalb, Ur 0.0 - 1.9 mg/dL 0.8  MICROALB/CREAT RATIO 0.0 - 30.0 mg/g 0.7     ASSESSMENT / PLAN / RECOMMENDATIONS:   1) Type 1 Diabetes  Mellitus, Optimally controlled, Without complications - Most recent A1c of 6.6 %. Goal A1c < 7.0 %.    -A1c optimal -She is tolerating metformin, this was started because her insurance would not cover GLP-1 agonist due to type I DM diagnosis -No changes   MEDICATIONS: Cotinue Metformin 500 mg XR, 1 tab twice daily Humalog per pump     Pump   tandem Settings   Insulin type   Humalog   Basal rate       0000 0.850 u/h    0600 0.800   1200 1.00      I:C ratio       0000  1:15                  Sensitivity       0000  50      Goal       0000  110         EDUCATION / INSTRUCTIONS: BG monitoring instructions: Patient is instructed to check her blood sugars 3 times a day, before meals. Call [facility] if: BG persistently < 70  I reviewed the Rule of 15 for the treatment of hypoglycemia in detail with the patient. Literature supplied.    2) Diabetic complications:  Eye: Does not have known diabetic retinopathy.  Neuro/ Feet: Does not have known diabetic peripheral neuropathy .  Renal: Patient does not have known baseline CKD. She   is not on an ACEI/ARB at present.     3)Hypothyroidism:  -Patient is clinically euthyroid -No local neck symptoms   Medication Continue levothyroxine 75 mcg daily   3.Dyslipidemia :  -LDL above goal, we again discussed the increased risk of CVA/CAD with dyslipidemia -I have recommended statin therapy, unable to proceed with repeat labs today, as the patient does not have health insurance at this time, we opted to wait on this and recheck on the next visit -In the meantime, the patient was advised to follow low-fat diet and exercise    F/U in 6 months     Signed electronically by: Lyndle Herrlich, MD  [facility] Endocrinology  [facility] [address] [place] [place], [place] [address] Phone: [phone] FAX: [phone]   CC: Alinda Deem, MD [address] [place] [place] [address] Phone: [phone]  Fax: [phone]  Return to Endocrinology clinic as below: No future appointments.

## 2023-07-02 NOTE — Patient Instructions (Signed)

## 2023-07-07 ENCOUNTER — Other Ambulatory Visit: Payer: Self-pay | Admitting: Internal Medicine

## 2023-07-09 ENCOUNTER — Other Ambulatory Visit: Payer: Self-pay

## 2023-07-09 MED ORDER — METFORMIN HCL ER 500 MG PO TB24: 500.0000 mg | ORAL_TABLET | Freq: Two times a day (BID) | ORAL | 2 refills | Status: DC

## 2023-10-05 ENCOUNTER — Other Ambulatory Visit: Payer: Self-pay | Admitting: Internal Medicine

## 2023-10-10 ENCOUNTER — Telehealth: Payer: Self-pay

## 2023-10-10 NOTE — Telephone Encounter (Signed)
 Dexcom needs PA

## 2023-10-11 ENCOUNTER — Other Ambulatory Visit (HOSPITAL_COMMUNITY): Payer: Self-pay

## 2023-10-14 ENCOUNTER — Other Ambulatory Visit (HOSPITAL_COMMUNITY): Payer: Self-pay

## 2023-10-14 ENCOUNTER — Telehealth: Payer: Self-pay

## 2023-10-14 NOTE — Telephone Encounter (Signed)
 Pharmacy Patient Advocate Encounter   Received notification from Pt Calls Messages that prior authorization for Dexcom G7 sensor is required/requested.   Insurance verification completed.   The patient is insured through [facility]  .   Per test claim: PA required; PA submitted to above mentioned insurance via CoverMyMeds Key/confirmation #/EOC [id number] Status is pending

## 2023-10-15 NOTE — Telephone Encounter (Signed)
 Pharmacy Patient Advocate Encounter  Received notification from [facility] [employer] that Prior Authorization for Dexcom G7 sensor has been APPROVED through 10/14/24   PA #/Case ID/Reference #: [id number]

## 2023-10-24 DIAGNOSIS — K2100 Gastro-esophageal reflux disease with esophagitis, without bleeding: Secondary | ICD-10-CM | POA: Diagnosis not present

## 2023-10-24 DIAGNOSIS — E108 Type 1 diabetes mellitus with unspecified complications: Secondary | ICD-10-CM | POA: Diagnosis not present

## 2023-10-24 DIAGNOSIS — E039 Hypothyroidism, unspecified: Secondary | ICD-10-CM | POA: Diagnosis not present

## 2023-10-24 DIAGNOSIS — F900 Attention-deficit hyperactivity disorder, predominantly inattentive type: Secondary | ICD-10-CM | POA: Diagnosis not present

## 2023-11-19 DIAGNOSIS — Z6825 Body mass index (BMI) 25.0-25.9, adult: Secondary | ICD-10-CM | POA: Diagnosis not present

## 2023-11-19 DIAGNOSIS — F900 Attention-deficit hyperactivity disorder, predominantly inattentive type: Secondary | ICD-10-CM | POA: Diagnosis not present

## 2023-12-31 NOTE — Progress Notes (Signed)
 Name: Jasmine Schwartz  Age/ Sex: 58 y.o., female   MRN/ DOB: [id number], [birth year]     PCP: Orin Birk, MD   Reason for Endocrinology Evaluation: Type 1 Diabetes Mellitus  Initial Endocrine Consultative Visit: 10/20/2012    PATIENT IDENTIFIER: [name] is a 58 y.o. female with a past medical history of DM, Hx of breast Ca and SLE and hypothyroidism. The patient has followed with Endocrinology clinic since 10/20/2012 for consultative assistance with management of her diabetes.  DIABETIC HISTORY:  [name] was diagnosed with DM 1990, she was on Medtronic pump since 2004 but switch to tandem in 2023. Her hemoglobin A1c has ranged from 7.2% in 2018, peaking at 8.7% in 2019.  She was followed by Dr. Washington Hacker from 2014 until 08/2021 She was on Mounjaro  in 2023 which has improved her weight and glucose control, but unable to continue as is not covered by her insurance due to type I DM diagnosis   Saw Dr.Doerr in 2023   She establish care with me 10/01/2022, A1c was 7.2%, she was already on tandem pump, and we started metformin  to improve insulin  resistance, as her insurance would not cover GLP-1 agonist  SUBJECTIVE:   During the last visit (07/02/2023): A1c 6.6%  Today (12/31/2023): [name]  She checks her blood sugars 4 x times daily. 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 During the visit the pump basal and bolus doses were reviewed including carb/insulin  rations and supplemental doses. The clinical list was updated. The glucose meter download was reviewed in detail to determine if the current pump settings are providing the  best glycemic control without excessive hypoglycemia.   Pump and meter download:      Pump   tandem Settings   Insulin  type   Humalog    Basal rate       0000 0.850 u/h    0600 0.800   1200 1.00      I:C ratio       0000  1:15                  Sensitivity       0000  50      Goal       0000  110           Type & Model of Pump: Tandem Insulin  Type: Currently using Humalog .  There is no height or weight on file to calculate BMI.  PUMP STATISTICS: Average BG: 144 Average Daily Carbs (g):72 Average Total Daily Insulin :31.96 Average Daily Basal: 19.85(62%) Average Daily Bolus: 12.11 (38%)     HOME DIABETES REGIMEN:  Metformin  500 mg XR, 1 tabs twice daily Humalog   Levothyroxine  75 mcg daily    Statin: no ACE-I/ARB: no  CONTINUOUS GLUCOSE MONITORING RECORD INTERPRETATION    Dates of Recording: 5/8-5/21/2025  Sensor description: Dexcom  Results statistics:   CGM use % of time 100  Average and SD 144/38  Time in range 84%  % Time Above 180 14  % Time above 250 1.7  % Time Below target 0.3   Glycemic patterns summary: BGs are optimal throughout the day and  night  Hyperglycemic episodes postprandial  Hypoglycemic episodes occurred n/a  Overnight periods: Optimal    DIABETIC COMPLICATIONS: Microvascular complications:   Denies: CKD, neuropathy  Last Eye Exam: Completed 08/2022  Macrovascular complications:   Denies: CAD, CVA, PVD   HISTORY:  Past Medical History:  Past Medical History:  Diagnosis Date   Anxiety    Anxiety and depression 07/06/2011   Breast cancer (HCC) 01/01/13   /metastatic 1/1 lymph nodes, chemo, radiation, surgery   Depression    Diabetes mellitus    Type I   Dyslipidemia    Hypothyroidism    Lower extremity edema    Lymphadenopathy, supraclavicular    PONV (postoperative nausea and vomiting)    Type I diabetes mellitus (HCC)    uses insulin  pump   Venous insufficiency    Past Surgical History:   Past Surgical History:  Procedure Laterality Date   BREAST BIOPSY Right 01/01/13   Invasive mammary ca,metastatic in 1/1 lymph node   BREAST BIOPSY Left 01/09/13   Fibroadenoma,microcalcifications   CESAREAN SECTION  1989   I & D EXTREMITY Right 05/07/2018   Procedure: BURSECTOMY RIGHT ELBOW;  Surgeon: Timothy Ford, MD;  Location: [facility] OR;  Service: Orthopedics;  Laterality: Right;   LUMBAR DISC SURGERY  2009   Dr Mildred All   MASTECTOMY COMPLETE / SIMPLE Left 06/09/2013   MASTECTOMY MODIFIED RADICAL Right 06/09/2013   MASTECTOMY MODIFIED RADICAL Right 06/09/2013   Procedure: RIGHT MASTECTOMY MODIFIED RADICAL;  Surgeon: Levert Ready, MD;  Location: [facility] OR;  Service: General;  Laterality: Right;   PORTACATH PLACEMENT Left 01/2013   SIMPLE MASTECTOMY WITH AXILLARY SENTINEL NODE BIOPSY Left 06/09/2013   Procedure: LEFT SIMPLE MASTECTOMY;  Surgeon: Levert Ready, MD;  Location: [facility] OR;  Service: General;  Laterality: Left;   TUBAL LIGATION  1991   Social History:  reports that she has never smoked. She has never used smokeless tobacco. She reports that she does not drink alcohol and does not use drugs. Family History:  Family History  Problem Relation Age of Onset   Coronary artery disease Mother    Congestive Heart Failure Maternal Grandmother    Brain cancer Maternal Grandfather        dx in his early 67s   Cancer Neg Hx    Diabetes Neg Hx    Colon cancer Neg Hx    Esophageal cancer Neg Hx    Rectal cancer Neg Hx    Stomach cancer Neg Hx      HOME MEDICATIONS: Allergies as of 01/01/2024       Reactions   Doxycycline  Rash        Medication List        Accurate as of Dec 31, 2023 12:46 PM. If you have any questions, ask your nurse or doctor.          citalopram 10 MG tablet Commonly known as: CELEXA Take 10 mg by mouth daily.   Dexcom G7 Sensor Misc PLACE 1 SENSOR ONTO THE SKIN ONCE EVERY 10 DAYS   glucose blood test strip Contour Next Test Strips  USE TO TEST  BLOOD SUGAR FIVE TIMES DAILY   Contour Next Test test strip Generic drug: glucose blood USE TO TEST BLOOD SUGAR FIVE TIMES DAILY   HumaLOG  100 UNIT/ML injection Generic drug: insulin  lispro ADMINISTER 60 UNITS VIA INSULIN  PUMP EVERY DAY   insulin  lispro 100 UNIT/ML injection Commonly known as: HumaLOG  ADMINISTER 60 UNITS VIA INSULIN  PUMP EVERY DAY   hydroxychloroquine  200 MG tablet Commonly known as: PLAQUENIL Take 200 mg by mouth 2 (two) times daily.   ibuprofen  200 MG tablet Commonly known as: ADVIL  Take 400 mg by mouth 2 (two) times daily as needed for headache or moderate pain.   levothyroxine  75 MCG tablet Commonly known as: SYNTHROID  Take 1 tablet (75 mcg total) by mouth daily before breakfast.   meloxicam 7.5 MG tablet Commonly known as: MOBIC Take 7.5 mg by mouth daily as needed.   metFORMIN  500 MG 24 hr tablet Commonly known as: GLUCOPHAGE -XR Take 1 tablet (500 mg total) by mouth 2 (two) times daily with a meal.   pantoprazole 40 MG tablet Commonly known as: PROTONIX Take 1 tablet by mouth daily.   valACYclovir  1000 MG tablet Commonly known as: VALTREX  TAKE 2 TABLETS BY MOUTH EVERY 12 HOURS FOR 1 DAY   vitamin C 1000 MG tablet Take 2,000 mg by mouth 2 (two) times daily.         OBJECTIVE:   Vital Signs: BP 120/80 (BP Location: Left Arm, Patient Position: Sitting, Cuff Size: Normal)   Pulse 71   Ht 5\' 4"  (1.626 m)   Wt 152 lb (68.9 kg)   LMP 01/11/2013   SpO2 99%   BMI 26.09 kg/m   Wt Readings from Last 3 Encounters:  07/02/23 150 lb 12.8 oz (68.4 kg)  01/03/23 146 lb (66.2 kg)  10/01/22 138 lb (62.6 kg)     Exam: General: Pt appears well and is in NAD  Neck: General: Supple without adenopathy. Thyroid : No goiter or nodules appreciated.   Lungs: Clear with good BS bilat   Heart: RRR   Abdomen:  soft, nontender  Extremities: No pretibial edema.   Neuro: MS is good with appropriate affect, pt is alert and Ox3    DM foot exam:  01/01/2024  The skin of the feet is without sores or ulcerations, plantar callus formation has been noted at the forefoot The pedal pulses are 2+ on right and 2+ on left. The sensation is intact to a screening 5.07, 10 gram monofilament bilaterally          DATA REVIEWED:  Lab Results  Component Value Date   HGBA1C 6.6 (A) 07/02/2023   HGBA1C 6.9 (A) 01/03/2023   HGBA1C 7.2 (A) 10/01/2022     Latest Reference Range & Units 01/01/24 08:03  Microalb, Ur mg/dL <7.4  MICROALB/CREAT RATIO <30 mg/g creat NOTE  Creatinine, Urine 20 - 275 mg/dL 259      ASSESSMENT / PLAN / RECOMMENDATIONS:   1) Type 1 Diabetes Mellitus, Optimally controlled, Without complications - Most recent A1c of 6.6 %. Goal A1c < 7.0 %.    -A1c optimal -She is tolerating metformin , this was started because her insurance would not cover GLP-1 agonist due to type I DM diagnosis -No changes   MEDICATIONS: Cotinue Metformin  500 mg XR, 1 tab twice daily Humalog  per pump     Pump   tandem Settings   Insulin  type   Humalog    Basal rate       0000 0.850 u/h    0600 0.800   1200 1.00      I:C ratio       0000  1:15                  Sensitivity       0000  50      Goal       0000  110  EDUCATION / INSTRUCTIONS: BG monitoring instructions: Patient is instructed to check her blood sugars 3 times a day, before meals. Call [place] [facility] if: BG persistently < 70  I reviewed the Rule of 15 for the treatment of hypoglycemia in detail with the patient. Literature supplied.    2) Diabetic complications:  Eye: Does not have known diabetic retinopathy.  Neuro/ Feet: Does not have known diabetic peripheral neuropathy .  Renal: Patient does not have known baseline CKD. She   is not on an ACEI/ARB at present.     3)Hypothyroidism:  -Patient is clinically euthyroid -No local neck symptoms   Medication Continue levothyroxine  75 mcg daily   3.Dyslipidemia :  - Patient  had labs at PCPs office, these records are not available but per patient she believes her LDL continues to be elevated - In the past we discussed cardiovascular benefits of statin therapy - Patient in agreement of starting this  Medication Start atorvastatin 10 mg daily   F/U in 6 months     Signed electronically by: Natale Bail, MD  [facility] Endocrinology  [facility] [address] [place] [place], [place] [address] Phone: [phone] FAX: [phone]   CC: Orin Birk, MD [address] [place] [address] Phone: [phone]  Fax: [phone]  Return to Endocrinology clinic as below: Future Appointments  Date Time Provider Department Center  01/01/2024  7:30 AM Saranne Crislip, Julian Obey, MD [id number] None

## 2024-01-01 ENCOUNTER — Other Ambulatory Visit: Payer: Self-pay

## 2024-01-01 ENCOUNTER — Ambulatory Visit (INDEPENDENT_AMBULATORY_CARE_PROVIDER_SITE_OTHER): Payer: Self-pay | Admitting: Internal Medicine

## 2024-01-01 ENCOUNTER — Encounter: Payer: Self-pay | Admitting: Internal Medicine

## 2024-01-01 VITALS — BP 120/80 | HR 71 | Ht 64.0 in | Wt 152.0 lb

## 2024-01-01 DIAGNOSIS — E785 Hyperlipidemia, unspecified: Secondary | ICD-10-CM | POA: Diagnosis not present

## 2024-01-01 DIAGNOSIS — E109 Type 1 diabetes mellitus without complications: Secondary | ICD-10-CM

## 2024-01-01 DIAGNOSIS — E039 Hypothyroidism, unspecified: Secondary | ICD-10-CM

## 2024-01-01 LAB — POCT GLYCOSYLATED HEMOGLOBIN (HGB A1C): Hemoglobin A1C: 6.4 % — AB (ref 4.0–5.6)

## 2024-01-01 MED ORDER — DEXCOM G7 SENSOR MISC: 1.0000 | 0 refills | Status: DC

## 2024-01-01 MED ORDER — DEXCOM G7 SENSOR MISC
1.0000 | 3 refills | Status: DC
  Filled 2024-01-01: qty 3, 30d supply, fill #0

## 2024-01-01 MED ORDER — ATORVASTATIN CALCIUM 10 MG PO TABS: 10.0000 mg | ORAL_TABLET | Freq: Every day | ORAL | 3 refills | Status: AC

## 2024-01-01 MED ORDER — INSULIN LISPRO 100 UNIT/ML IJ SOLN: INTRAMUSCULAR | 3 refills | Status: AC

## 2024-01-01 MED ORDER — METFORMIN HCL ER 500 MG PO TB24: 500.0000 mg | ORAL_TABLET | Freq: Two times a day (BID) | ORAL | 2 refills | Status: AC

## 2024-01-01 MED ORDER — LEVOTHYROXINE SODIUM 75 MCG PO TABS: 75.0000 ug | ORAL_TABLET | Freq: Every day | ORAL | 2 refills | Status: AC

## 2024-01-01 NOTE — Patient Instructions (Signed)

## 2024-01-02 ENCOUNTER — Ambulatory Visit: Payer: Self-pay | Admitting: Internal Medicine

## 2024-01-02 LAB — MICROALBUMIN / CREATININE URINE RATIO
Creatinine, Urine: 141 mg/dL (ref 20–275)
Microalb, Ur: <0.2 mg/dL

## 2024-01-29 ENCOUNTER — Other Ambulatory Visit (HOSPITAL_COMMUNITY): Payer: Self-pay

## 2024-01-29 MED ORDER — TIRZEPATIDE 5 MG/0.5ML ~~LOC~~ SOAJ: 5.0000 mg | SUBCUTANEOUS | 3 refills | Status: DC

## 2024-02-05 ENCOUNTER — Telehealth: Payer: Self-pay

## 2024-02-05 NOTE — Telephone Encounter (Signed)
 Pharmacy Patient Advocate Encounter   Received notification from CoverMyMeds that prior authorization for Mounjaro  is required/requested.   Insurance verification completed.   The patient is insured through [facility] .  Mounjaro  is approved exclusively as an adjunct to diet and exercise to improve glycemic  control in adults with type 2 diabetes mellitus. A review of patient's medical chart reveals no  documented diagnosis of type 2 diabetes. Therefore, they do not  currently meet the criteria for prior authorization of this medication. If clinically appropriate, alternative  options such as Saxenda, Zepbound , or Georjean may be considered for this patient.

## 2024-02-11 DIAGNOSIS — E109 Type 1 diabetes mellitus without complications: Secondary | ICD-10-CM | POA: Diagnosis not present

## 2024-02-12 DIAGNOSIS — E113393 Type 2 diabetes mellitus with moderate nonproliferative diabetic retinopathy without macular edema, bilateral: Secondary | ICD-10-CM | POA: Diagnosis not present

## 2024-02-12 DIAGNOSIS — Z79899 Other long term (current) drug therapy: Secondary | ICD-10-CM | POA: Diagnosis not present

## 2024-02-12 DIAGNOSIS — D3131 Benign neoplasm of right choroid: Secondary | ICD-10-CM | POA: Diagnosis not present

## 2024-02-12 DIAGNOSIS — H02889 Meibomian gland dysfunction of unspecified eye, unspecified eyelid: Secondary | ICD-10-CM | POA: Diagnosis not present

## 2024-02-16 ENCOUNTER — Other Ambulatory Visit: Payer: Self-pay | Admitting: Internal Medicine

## 2024-02-18 ENCOUNTER — Other Ambulatory Visit: Payer: Self-pay | Admitting: Internal Medicine

## 2024-02-18 MED ORDER — TIRZEPATIDE-WEIGHT MANAGEMENT 5 MG/0.5ML ~~LOC~~ SOLN: 5.0000 mg | SUBCUTANEOUS | 3 refills | Status: DC

## 2024-02-20 ENCOUNTER — Telehealth: Payer: Self-pay

## 2024-02-20 ENCOUNTER — Other Ambulatory Visit (HOSPITAL_COMMUNITY): Payer: Self-pay

## 2024-02-20 MED ORDER — ZEPBOUND 5 MG/0.5ML ~~LOC~~ SOAJ: 5.0000 mg | SUBCUTANEOUS | 6 refills | Status: DC

## 2024-02-20 NOTE — Telephone Encounter (Signed)
 Pharmacy Patient Advocate Encounter   Received notification from Pt Calls Messages that prior authorization for Zepbound  5mg  is required/requested.   Insurance verification completed.   The patient is insured through [employer] .   Per test claim: ANTI-OBESITY MEDICATION NOT COVERED

## 2024-02-20 NOTE — Telephone Encounter (Signed)
 Tirzepatide  needs PA

## 2024-02-20 NOTE — Telephone Encounter (Signed)
 Were you meaning to send the vials instead of pen for the Tirzepatide .

## 2024-05-08 ENCOUNTER — Other Ambulatory Visit: Payer: Self-pay | Admitting: Internal Medicine

## 2024-05-13 DIAGNOSIS — H6122 Impacted cerumen, left ear: Secondary | ICD-10-CM | POA: Diagnosis not present

## 2024-05-13 DIAGNOSIS — H9202 Otalgia, left ear: Secondary | ICD-10-CM | POA: Diagnosis not present

## 2024-06-10 ENCOUNTER — Telehealth: Payer: Self-pay

## 2024-06-10 NOTE — Telephone Encounter (Signed)
 Patient called with a question concerning her PA for Mounjaro  and Zepbound . Patient made aware of the decisions for both. Patient has an understanding, but states she may reach out to her insurance to see if there is a way to appeal the decision. Patient will call office back if she decided to appeal the decision to determine what may be needed from the office for the appeal process.

## 2024-06-15 DIAGNOSIS — F900 Attention-deficit hyperactivity disorder, predominantly inattentive type: Secondary | ICD-10-CM | POA: Diagnosis not present

## 2024-06-15 DIAGNOSIS — E108 Type 1 diabetes mellitus with unspecified complications: Secondary | ICD-10-CM | POA: Diagnosis not present

## 2024-06-15 DIAGNOSIS — E039 Hypothyroidism, unspecified: Secondary | ICD-10-CM | POA: Diagnosis not present

## 2024-06-15 DIAGNOSIS — F411 Generalized anxiety disorder: Secondary | ICD-10-CM | POA: Diagnosis not present

## 2024-06-15 DIAGNOSIS — Z2821 Immunization not carried out because of patient refusal: Secondary | ICD-10-CM | POA: Diagnosis not present

## 2024-06-15 DIAGNOSIS — K2100 Gastro-esophageal reflux disease with esophagitis, without bleeding: Secondary | ICD-10-CM | POA: Diagnosis not present

## 2024-06-26 ENCOUNTER — Telehealth: Payer: Self-pay

## 2024-06-26 MED ORDER — MOUNJARO 2.5 MG/0.5ML ~~LOC~~ SOAJ: 2.5000 mg | SUBCUTANEOUS | 3 refills | Status: DC

## 2024-06-26 NOTE — Telephone Encounter (Signed)
 Patient would like a new script for mounjaro  2.5.  She has been off the medication for a year, but now has a coupon she would like to use.

## 2024-07-02 NOTE — Progress Notes (Signed)
 Virtual Visit via Video Note  I connected with Jasmine Schwartz on 07/03/24 at  7:30 AM EST by a video enabled telemedicine application and verified that I am speaking with the correct person using two identifiers.   I discussed the limitations of evaluation and management by telemedicine and the availability of in person appointments. The patient expressed understanding and agreed to proceed.   -Location of the patient : home -Location of the provider : Office -The names of all persons participating in the telemedicine service : Pt and myself         Name: Jasmine Schwartz  Age/ Sex: 58 y.o., female   MRN/ DOB: [id number], [birth year]     PCP: Gayl Males, MD   Reason for Endocrinology Evaluation: Type 1 Diabetes Mellitus  Initial Endocrine Consultative Visit: 10/20/2012    PATIENT IDENTIFIER: Jasmine Schwartz is a 58 y.o. female with a past medical history of DM, Hx of breast Ca and SLE and hypothyroidism. The patient has followed with Endocrinology clinic since 10/20/2012 for consultative assistance with management of her diabetes.  DIABETIC HISTORY:  Jasmine Schwartz was diagnosed with DM 1990, she was on Medtronic pump since 2004 but switch to tandem in 2023. Her hemoglobin A1c has ranged from 7.2% in 2018, peaking at 8.7% in 2019.  She was followed by Dr. Kassie from 2014 until 08/2021 She was on Mounjaro  in 2023 which has improved her weight and glucose control, but unable to continue as is not covered by her insurance due to type I DM diagnosis   Saw Dr.Doerr in 2023   She establish care with me 10/01/2022, A1c was 7.2%, she was already on tandem pump, and we started metformin  to improve insulin  resistance, as her insurance would not cover GLP-1 agonist  SUBJECTIVE:   During the last visit (01/01/2024): A1c 6.6%  Today (07/03/2024): Jasmine Schwartz  She checks her blood sugars 4 x times daily. 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 During the visit the pump basal and bolus doses were reviewed including carb/insulin  rations and supplemental doses. The clinical list was updated. The glucose meter download was reviewed in detail to determine if the current pump settings are providing the best glycemic control without excessive hypoglycemia.   Pump and meter download:      Pump   tandem Settings   Insulin  type   Humalog    Basal rate       0000 0.850 u/h    0600 0.800   1200 1.00      I:C ratio       0000  1:15          Sensitivity       0000  50      Goal       0000  110           Type & Model of Pump: Tandem Insulin  Type: Currently using Humalog .  Body mass index is 26.09 kg/m.  PUMP STATISTICS: Average BG: 160 Average Daily Carbs (g):46 Average Total Daily Insulin :33.56 Average Daily Basal: 20.93(62%) Average Daily Bolus: 12.63 (38%)    HOME DIABETES REGIMEN:  Metformin  500 mg XR, 1 tabs twice daily Mounjaro  2.5 mg weekly Humalog   Levothyroxine  75 mcg daily Atorvastatin  10 mg daily    Statin: no ACE-I/ARB: no  CONTINUOUS GLUCOSE MONITORING RECORD INTERPRETATION    Dates of Recording: 11/7-11/20/2025  Sensor description: Dexcom  Results statistics:   CGM use % of time 99  Average and SD 160/57  Time in range 70%  % Time Above 180 21  % Time above 250 7.6  % Time Below target 0.9   Glycemic patterns summary: BGs are optimal overnight and most of the day  Hyperglycemic episodes postprandial  Hypoglycemic episodes occurred after bolus  Overnight periods: Optimal    DIABETIC COMPLICATIONS: Microvascular complications:   Denies: CKD, neuropathy   Last Eye Exam: Completed 08/2022  Macrovascular complications:   Denies: CAD, CVA, PVD   HISTORY:  Past Medical History:  Past Medical History:  Diagnosis Date   Anxiety    Anxiety and depression 07/06/2011   Breast cancer (HCC) 01/01/13   /metastatic 1/1 lymph nodes, chemo, radiation, surgery   Depression    Diabetes mellitus    Type I   Dyslipidemia    Hypothyroidism    Lower extremity edema    Lymphadenopathy, supraclavicular    PONV (postoperative nausea and vomiting)    Type I diabetes mellitus (HCC)    uses insulin  pump   Venous insufficiency    Past Surgical History:  Past Surgical History:  Procedure Laterality Date   BREAST BIOPSY Right 01/01/13   Invasive mammary ca,metastatic in 1/1 lymph node   BREAST BIOPSY Left 01/09/13   Fibroadenoma,microcalcifications   CESAREAN SECTION  1989   I & D EXTREMITY Right 05/07/2018   Procedure: BURSECTOMY RIGHT ELBOW;  Surgeon: Harden Jerona GAILS, MD;  Location: [facility] OR;  Service: Orthopedics;  Laterality: Right;   LUMBAR DISC SURGERY  2009   Dr Baird   MASTECTOMY COMPLETE / SIMPLE Left 06/09/2013   MASTECTOMY MODIFIED RADICAL Right 06/09/2013   MASTECTOMY MODIFIED RADICAL Right 06/09/2013   Procedure: RIGHT MASTECTOMY MODIFIED RADICAL;  Surgeon: Elon CHRISTELLA Pacini, MD;  Location[facility];  Service: General;  Laterality: Right;   PORTACATH PLACEMENT Left 01/2013   SIMPLE MASTECTOMY WITH AXILLARY SENTINEL NODE BIOPSY Left 06/09/2013   Procedure: LEFT SIMPLE MASTECTOMY;  Surgeon: Elon CHRISTELLA Pacini, MD;  Location: [facility] OR;  Service: General;  Laterality: Left;   TUBAL LIGATION  1991   Social History:  reports that she has never smoked. She has never used smokeless tobacco. She reports that she does not drink alcohol and does not use drugs. Family History:  Family History  Problem Relation Age of Onset   Coronary artery disease Mother    Congestive Heart Failure Maternal Grandmother    Brain cancer Maternal Grandfather        dx in his  early 71s   Cancer Neg Hx    Diabetes Neg Hx    Colon cancer Neg Hx    Esophageal cancer Neg Hx    Rectal cancer Neg Hx    Stomach cancer Neg Hx      HOME MEDICATIONS: Allergies as of 07/03/2024       Reactions   Doxycycline  Rash        Medication List        Accurate as of July 03, 2024  7:31 AM. If you have any questions, ask your nurse or doctor.          atorvastatin  10 MG tablet Commonly known as: LIPITOR  Take 1 tablet (10 mg total) by mouth daily.   citalopram 10 MG tablet Commonly known as: CELEXA Take 10 mg by mouth daily.   Dexcom G7 Sensor Misc USE AS DIRECTED every 10 DAYS.   glucose blood test strip Contour Next Test Strips  USE TO TEST BLOOD SUGAR FIVE TIMES DAILY   HumaLOG  100 UNIT/ML injection Generic drug: insulin  lispro ADMINISTER 60 UNITS VIA INSULIN  PUMP EVERY DAY   insulin  lispro 100 UNIT/ML injection Commonly known as: HumaLOG  ADMINISTER 60 UNITS VIA INSULIN  PUMP EVERY DAY   hydroxychloroquine 200 MG tablet Commonly known as: PLAQUENIL Take 200 mg by mouth 2 (two) times daily.   levothyroxine  75 MCG tablet Commonly known as: SYNTHROID  Take 1 tablet (75 mcg total) by mouth daily before breakfast.   lisdexamfetamine 20 MG capsule Commonly known as: VYVANSE Take by mouth. What changed: Another medication with the same name was removed. Continue taking this medication, and follow the directions you see here. Changed by: Donell PARAS Carliyah Cotterman   meloxicam 7.5 MG tablet Commonly known as: MOBIC Take 7.5 mg by mouth daily as needed.   metFORMIN  500 MG 24 hr tablet Commonly known as: GLUCOPHAGE -XR Take 1 tablet (500 mg total) by mouth 2 (two) times daily with a meal.   Mounjaro  2.5 MG/0.5ML Pen Generic drug: tirzepatide  Inject 2.5 mg into the skin once a week.   valACYclovir  1000 MG tablet Commonly known as: VALTREX  TAKE 2 TABLETS BY MOUTH EVERY 12 HOURS FOR 1 DAY   vitamin C 1000 MG tablet Take 2,000 mg by mouth 2  (two) times daily.         OBJECTIVE:   Vital Signs: Ht 5' 4 (1.626 m)   Wt 152 lb (68.9 kg)   LMP 01/11/2013   BMI 26.09 kg/m   Wt Readings from Last 3 Encounters:  07/03/24 152 lb (68.9 kg)  01/01/24 152 lb (68.9 kg)  07/02/23 150 lb 12.8 oz (68.4 kg)     Exam: General: Pt appears well and is in NAD  Neuro: MS is good with appropriate affect, pt is alert and Ox3    DM foot exam: 01/01/2024  The skin of the feet is without sores or ulcerations, plantar callus formation has been noted at the forefoot The pedal pulses are 2+ on right and 2+ on left. The sensation is intact to a screening 5.07, 10 gram monofilament bilaterally     DATA REVIEWED:  Lab Results  Component Value Date   HGBA1C 6.4 (A) 01/01/2024   HGBA1C 6.6 (A) 07/02/2023   HGBA1C 6.9 (A) 01/03/2023     Latest Reference Range & Units 01/01/24 08:03  Microalb, Ur mg/dL <9.7  MICROALB/CREAT RATIO <30 mg/g creat NOTE  Creatinine, Urine 20 - 275 mg/dL 858      ASSESSMENT / PLAN / RECOMMENDATIONS:   1) Type 1 Diabetes Mellitus, Optimally controlled, Without complications - . Goal A1c < 7.0 %.    -This was a virtual visit, GMI 7.1% -I have asked her to continue on the metformin  at this time in addition to starting Mounjaro  -She just tarted Mounjaro , has minimal nausea, we will send a new prescription of 5 mg weekly -I did encourage the patient to increase water intake, fiber intake, while on Mounjaro  to prevent constipation -I have asked the patient to increase strength training and start a multivitamin to prevent nutritional deficiencies - I will decrease her basal rate as well as adjust her sensitivity factor to prevent hypoglycemia - A new prescription  of Mounjaro  5 mg has been sent to [facility] in [place]  MEDICATIONS: Cotinue Metformin  500 mg XR, 1 tab twice daily Increase Mounjaro  5 mg weekly Humalog  per pump     Pump   tandem Settings   Insulin  type   Humalog    Basal  rate       0000 0.800 u/h    0600 0.750   1200 1.000      I:C ratio       0000  1:15                  Sensitivity       0000  50   0600 55   1200 55  Goal       0000  110        EDUCATION / INSTRUCTIONS: BG monitoring instructions: Patient is instructed to check her blood sugars 3 times a day, before meals. Call [place] [facility] if: BG persistently < 70  I reviewed the Rule of 15 for the treatment of hypoglycemia in detail with the patient. Literature supplied.    2) Diabetic complications:  Eye: Does not have known diabetic retinopathy.  Neuro/ Feet: Does not have known diabetic peripheral neuropathy .  Renal: Patient does not have known baseline CKD. She   is not on an ACEI/ARB at present.     3)Hypothyroidism:  - Patient is clinically euthyroid - TFTs have been normal - No change   Medication Continue levothyroxine  75 mcg daily   3.Dyslipidemia :  - Historically she has had elevated LDL -She was started on statin therapy in May, 2025  -Tolerating well  Medication Continue atorvastatin  10 mg daily   F/U in 6 months     Signed electronically by: Stefano Redgie Butts, MD  [facility] Endocrinology  [facility] [address] [place] [place], [place] [address] Phone: [phone] FAX: [phone]   CC: Gayl Males, MD [address] [place] [address] Phone: [phone]  Fax: [phone]  Return to Endocrinology clinic as below: No future appointments.

## 2024-07-03 ENCOUNTER — Encounter: Payer: Self-pay | Admitting: Internal Medicine

## 2024-07-03 ENCOUNTER — Telehealth (INDEPENDENT_AMBULATORY_CARE_PROVIDER_SITE_OTHER): Admitting: Internal Medicine

## 2024-07-03 VITALS — Ht 64.0 in | Wt 152.0 lb

## 2024-07-03 DIAGNOSIS — E785 Hyperlipidemia, unspecified: Secondary | ICD-10-CM | POA: Diagnosis not present

## 2024-07-03 DIAGNOSIS — E039 Hypothyroidism, unspecified: Secondary | ICD-10-CM

## 2024-07-03 DIAGNOSIS — E109 Type 1 diabetes mellitus without complications: Secondary | ICD-10-CM | POA: Diagnosis not present

## 2024-07-03 MED ORDER — TIRZEPATIDE 5 MG/0.5ML ~~LOC~~ SOAJ: 5.0000 mg | SUBCUTANEOUS | 3 refills | Status: AC

## 2024-07-20 DIAGNOSIS — Z0000 Encounter for general adult medical examination without abnormal findings: Secondary | ICD-10-CM | POA: Diagnosis not present

## 2024-07-20 DIAGNOSIS — Z1331 Encounter for screening for depression: Secondary | ICD-10-CM | POA: Diagnosis not present

## 2024-07-20 DIAGNOSIS — Z2821 Immunization not carried out because of patient refusal: Secondary | ICD-10-CM | POA: Diagnosis not present

## 2024-07-20 DIAGNOSIS — Z1389 Encounter for screening for other disorder: Secondary | ICD-10-CM | POA: Diagnosis not present

## 2024-08-07 DIAGNOSIS — Z01419 Encounter for gynecological examination (general) (routine) without abnormal findings: Secondary | ICD-10-CM | POA: Diagnosis not present

## 2024-09-04 ENCOUNTER — Other Ambulatory Visit: Payer: Self-pay

## 2024-09-04 MED ORDER — DEXCOM G7 SENSOR MISC: 3 refills | Status: AC
